# Patient Record
Sex: Female | Born: 1935
Health system: Southern US, Community
[De-identification: ages and names within clinical notes are randomized; demographics above are authoritative.]

## PROBLEM LIST (undated history)

## (undated) DIAGNOSIS — E785 Hyperlipidemia, unspecified: Secondary | ICD-10-CM

## (undated) DIAGNOSIS — D509 Iron deficiency anemia, unspecified: Secondary | ICD-10-CM

## (undated) DIAGNOSIS — K219 Gastro-esophageal reflux disease without esophagitis: Secondary | ICD-10-CM

## (undated) DIAGNOSIS — T4145XA Adverse effect of unspecified anesthetic, initial encounter: Secondary | ICD-10-CM

## (undated) DIAGNOSIS — H3530 Unspecified macular degeneration: Secondary | ICD-10-CM

## (undated) DIAGNOSIS — K449 Diaphragmatic hernia without obstruction or gangrene: Secondary | ICD-10-CM

## (undated) DIAGNOSIS — J381 Polyp of vocal cord and larynx: Secondary | ICD-10-CM

## (undated) DIAGNOSIS — M1990 Unspecified osteoarthritis, unspecified site: Secondary | ICD-10-CM

## (undated) DIAGNOSIS — C541 Malignant neoplasm of endometrium: Secondary | ICD-10-CM

## (undated) DIAGNOSIS — J449 Chronic obstructive pulmonary disease, unspecified: Secondary | ICD-10-CM

## (undated) DIAGNOSIS — Z5189 Encounter for other specified aftercare: Secondary | ICD-10-CM

## (undated) DIAGNOSIS — Z9889 Other specified postprocedural states: Secondary | ICD-10-CM

## (undated) DIAGNOSIS — I639 Cerebral infarction, unspecified: Secondary | ICD-10-CM

## (undated) DIAGNOSIS — IMO0002 Reserved for concepts with insufficient information to code with codable children: Secondary | ICD-10-CM

## (undated) DIAGNOSIS — T8859XA Other complications of anesthesia, initial encounter: Secondary | ICD-10-CM

## (undated) DIAGNOSIS — Z853 Personal history of malignant neoplasm of breast: Secondary | ICD-10-CM

## (undated) DIAGNOSIS — F419 Anxiety disorder, unspecified: Secondary | ICD-10-CM

## (undated) DIAGNOSIS — H269 Unspecified cataract: Secondary | ICD-10-CM

## (undated) DIAGNOSIS — F32A Depression, unspecified: Secondary | ICD-10-CM

## (undated) DIAGNOSIS — T7840XA Allergy, unspecified, initial encounter: Secondary | ICD-10-CM

## (undated) DIAGNOSIS — D689 Coagulation defect, unspecified: Secondary | ICD-10-CM

## (undated) DIAGNOSIS — C4492 Squamous cell carcinoma of skin, unspecified: Secondary | ICD-10-CM

## (undated) DIAGNOSIS — K635 Polyp of colon: Secondary | ICD-10-CM

## (undated) DIAGNOSIS — R001 Bradycardia, unspecified: Secondary | ICD-10-CM

## (undated) DIAGNOSIS — C4491 Basal cell carcinoma of skin, unspecified: Secondary | ICD-10-CM

## (undated) DIAGNOSIS — G510 Bell's palsy: Secondary | ICD-10-CM

## (undated) DIAGNOSIS — C50919 Malignant neoplasm of unspecified site of unspecified female breast: Secondary | ICD-10-CM

## (undated) DIAGNOSIS — M779 Enthesopathy, unspecified: Secondary | ICD-10-CM

## (undated) DIAGNOSIS — I10 Essential (primary) hypertension: Secondary | ICD-10-CM

## (undated) DIAGNOSIS — Z8673 Personal history of transient ischemic attack (TIA), and cerebral infarction without residual deficits: Secondary | ICD-10-CM

## (undated) DIAGNOSIS — K819 Cholecystitis, unspecified: Secondary | ICD-10-CM

## (undated) DIAGNOSIS — B029 Zoster without complications: Secondary | ICD-10-CM

## (undated) DIAGNOSIS — I714 Abdominal aortic aneurysm, without rupture: Secondary | ICD-10-CM

## (undated) DIAGNOSIS — R112 Nausea with vomiting, unspecified: Secondary | ICD-10-CM

## (undated) HISTORY — DX: Unspecified macular degeneration: H35.30

## (undated) HISTORY — PX: TONSILLECTOMY: SUR1361

## (undated) HISTORY — DX: Iron deficiency anemia, unspecified: D50.9

## (undated) HISTORY — DX: Squamous cell carcinoma of skin, unspecified: C44.92

## (undated) HISTORY — DX: Essential (primary) hypertension: I10

## (undated) HISTORY — DX: Chronic obstructive pulmonary disease, unspecified: J44.9

## (undated) HISTORY — DX: Allergy, unspecified, initial encounter: T78.40XA

## (undated) HISTORY — DX: Diaphragmatic hernia without obstruction or gangrene: K44.9

## (undated) HISTORY — PX: SQUAMOUS CELL CARCINOMA EXCISION: SHX2433

## (undated) HISTORY — PX: APPENDECTOMY: SHX54

## (undated) HISTORY — DX: Unspecified cataract: H26.9

## (undated) HISTORY — PX: COLONOSCOPY W/ POLYPECTOMY: SHX1380

## (undated) HISTORY — DX: Personal history of transient ischemic attack (TIA), and cerebral infarction without residual deficits: Z86.73

## (undated) HISTORY — DX: Coagulation defect, unspecified: D68.9

## (undated) HISTORY — DX: Polyp of vocal cord and larynx: J38.1

## (undated) HISTORY — DX: Cerebral infarction, unspecified: I63.9

## (undated) HISTORY — DX: Bradycardia, unspecified: R00.1

## (undated) HISTORY — PX: ABDOMINAL HYSTERECTOMY: SHX81

## (undated) HISTORY — DX: Zoster without complications: B02.9

## (undated) HISTORY — PX: BREAST SURGERY: SHX581

## (undated) HISTORY — PX: BASAL CELL CARCINOMA EXCISION: SHX1214

## (undated) HISTORY — PX: COLONOSCOPY: SHX174

## (undated) HISTORY — PX: TEAR DUCT PROBING: SHX793

## (undated) HISTORY — DX: Bell's palsy: G51.0

## (undated) HISTORY — DX: Gastro-esophageal reflux disease without esophagitis: K21.9

## (undated) HISTORY — DX: Unspecified osteoarthritis, unspecified site: M19.90

## (undated) HISTORY — DX: Hyperlipidemia, unspecified: E78.5

## (undated) HISTORY — DX: Polyp of colon: K63.5

## (undated) HISTORY — DX: Encounter for other specified aftercare: Z51.89

## (undated) HISTORY — DX: Depression, unspecified: F32.A

## (undated) SURGERY — Surgical Case
Anesthesia: *Unknown

---

## 1967-02-01 HISTORY — PX: OTHER SURGICAL HISTORY: SHX169

## 1982-01-31 DIAGNOSIS — G510 Bell's palsy: Secondary | ICD-10-CM

## 1982-01-31 HISTORY — DX: Bell's palsy: G51.0

## 1998-08-15 ENCOUNTER — Other Ambulatory Visit: Admission: RE | Admit: 1998-08-15 | Discharge: 1998-08-15 | Payer: Self-pay | Admitting: Obstetrics and Gynecology

## 1999-05-25 ENCOUNTER — Other Ambulatory Visit: Admission: RE | Admit: 1999-05-25 | Discharge: 1999-05-25 | Payer: Self-pay | Admitting: Obstetrics and Gynecology

## 2000-09-15 ENCOUNTER — Other Ambulatory Visit: Admission: RE | Admit: 2000-09-15 | Discharge: 2000-09-15 | Payer: Self-pay | Admitting: Obstetrics and Gynecology

## 2000-10-03 ENCOUNTER — Encounter: Payer: Self-pay | Admitting: Gastroenterology

## 2000-10-03 ENCOUNTER — Encounter: Admission: RE | Admit: 2000-10-03 | Discharge: 2000-10-03 | Payer: Self-pay | Admitting: Gastroenterology

## 2000-11-22 ENCOUNTER — Encounter: Payer: Self-pay | Admitting: Surgery

## 2000-11-22 ENCOUNTER — Observation Stay (HOSPITAL_COMMUNITY): Admission: RE | Admit: 2000-11-22 | Discharge: 2000-11-23 | Payer: Self-pay | Admitting: Surgery

## 2002-01-31 DIAGNOSIS — K819 Cholecystitis, unspecified: Secondary | ICD-10-CM

## 2002-01-31 HISTORY — DX: Cholecystitis, unspecified: K81.9

## 2002-01-31 HISTORY — PX: CHOLECYSTECTOMY: SHX55

## 2004-02-19 ENCOUNTER — Ambulatory Visit: Payer: Self-pay | Admitting: Internal Medicine

## 2004-02-19 ENCOUNTER — Encounter: Admission: RE | Admit: 2004-02-19 | Discharge: 2004-02-19 | Payer: Self-pay | Admitting: Internal Medicine

## 2004-02-26 ENCOUNTER — Ambulatory Visit: Payer: Self-pay | Admitting: Internal Medicine

## 2004-02-27 ENCOUNTER — Encounter: Admission: RE | Admit: 2004-02-27 | Discharge: 2004-02-27 | Payer: Self-pay | Admitting: Internal Medicine

## 2004-03-12 ENCOUNTER — Ambulatory Visit: Payer: Self-pay | Admitting: Internal Medicine

## 2004-03-17 ENCOUNTER — Encounter: Admission: RE | Admit: 2004-03-17 | Discharge: 2004-04-14 | Payer: Self-pay | Admitting: Internal Medicine

## 2004-09-22 ENCOUNTER — Encounter: Payer: Self-pay | Admitting: Internal Medicine

## 2004-09-30 ENCOUNTER — Ambulatory Visit: Payer: Self-pay | Admitting: Internal Medicine

## 2004-10-05 ENCOUNTER — Ambulatory Visit: Payer: Self-pay

## 2004-10-05 ENCOUNTER — Encounter: Payer: Self-pay | Admitting: Internal Medicine

## 2004-10-05 ENCOUNTER — Ambulatory Visit: Payer: Self-pay | Admitting: Cardiology

## 2005-01-11 ENCOUNTER — Ambulatory Visit: Payer: Self-pay | Admitting: Internal Medicine

## 2005-01-31 LAB — HM MAMMOGRAPHY

## 2005-01-31 LAB — CONVERTED CEMR LAB: Pap Smear: NORMAL

## 2005-03-07 ENCOUNTER — Ambulatory Visit: Payer: Self-pay | Admitting: Internal Medicine

## 2005-03-14 ENCOUNTER — Ambulatory Visit: Payer: Self-pay | Admitting: Internal Medicine

## 2005-09-20 ENCOUNTER — Ambulatory Visit: Payer: Self-pay | Admitting: Internal Medicine

## 2005-09-28 ENCOUNTER — Ambulatory Visit: Payer: Self-pay | Admitting: Internal Medicine

## 2005-10-06 ENCOUNTER — Ambulatory Visit: Payer: Self-pay | Admitting: Internal Medicine

## 2005-11-03 ENCOUNTER — Ambulatory Visit: Payer: Self-pay | Admitting: Internal Medicine

## 2005-11-03 DIAGNOSIS — E785 Hyperlipidemia, unspecified: Secondary | ICD-10-CM

## 2005-11-03 DIAGNOSIS — K219 Gastro-esophageal reflux disease without esophagitis: Secondary | ICD-10-CM

## 2005-11-03 HISTORY — DX: Gastro-esophageal reflux disease without esophagitis: K21.9

## 2005-11-03 HISTORY — DX: Hyperlipidemia, unspecified: E78.5

## 2005-11-10 ENCOUNTER — Encounter: Admission: RE | Admit: 2005-11-10 | Discharge: 2005-11-10 | Payer: Self-pay | Admitting: Internal Medicine

## 2005-11-21 ENCOUNTER — Ambulatory Visit: Payer: Self-pay | Admitting: Internal Medicine

## 2006-01-31 DIAGNOSIS — B029 Zoster without complications: Secondary | ICD-10-CM

## 2006-01-31 DIAGNOSIS — K449 Diaphragmatic hernia without obstruction or gangrene: Secondary | ICD-10-CM

## 2006-01-31 HISTORY — DX: Diaphragmatic hernia without obstruction or gangrene: K44.9

## 2006-01-31 HISTORY — DX: Zoster without complications: B02.9

## 2006-03-08 ENCOUNTER — Ambulatory Visit: Payer: Self-pay | Admitting: Internal Medicine

## 2006-03-08 LAB — CONVERTED CEMR LAB
ALT: 19 units/L (ref 0–40)
AST: 25 units/L (ref 0–37)
Albumin: 3.8 g/dL (ref 3.5–5.2)
Alkaline Phosphatase: 92 units/L (ref 39–117)
BUN: 6 mg/dL (ref 6–23)
Basophils Absolute: 0 10*3/uL (ref 0.0–0.1)
Basophils Relative: 0.6 % (ref 0.0–1.0)
Bilirubin, Direct: 0.1 mg/dL (ref 0.0–0.3)
CO2: 32 meq/L (ref 19–32)
Calcium: 9.9 mg/dL (ref 8.4–10.5)
Chloride: 105 meq/L (ref 96–112)
Cholesterol: 209 mg/dL (ref 0–200)
Creatinine, Ser: 1.2 mg/dL (ref 0.4–1.2)
Direct LDL: 134.8 mg/dL
Eosinophils Absolute: 0.1 10*3/uL (ref 0.0–0.6)
Eosinophils Relative: 1 % (ref 0.0–5.0)
Folate: 10.3 ng/mL
GFR calc Af Amer: 57 mL/min
GFR calc non Af Amer: 47 mL/min
Glucose, Bld: 95 mg/dL (ref 70–99)
HCT: 41.6 % (ref 36.0–46.0)
HDL: 51.1 mg/dL (ref >39.0)
Hemoglobin: 14.9 g/dL (ref 12.0–15.0)
Lymphocytes Relative: 22.5 % (ref 12.0–46.0)
MCHC: 35.7 g/dL (ref 30.0–36.0)
MCV: 96.8 fL (ref 78.0–100.0)
Monocytes Absolute: 0.6 10*3/uL (ref 0.2–0.7)
Monocytes Relative: 8.6 % (ref 3.0–11.0)
Neutro Abs: 4.7 10*3/uL (ref 1.4–7.7)
Neutrophils Relative %: 67.3 % (ref 43.0–77.0)
Platelets: 250 10*3/uL (ref 150–400)
Potassium: 4.6 meq/L (ref 3.5–5.1)
RBC: 4.3 M/uL (ref 3.87–5.11)
RDW: 11.1 % — ABNORMAL LOW (ref 11.5–14.6)
Sodium: 143 meq/L (ref 135–145)
TSH: 2.51 microintl units/mL (ref 0.35–5.50)
Total Bilirubin: 0.6 mg/dL (ref 0.3–1.2)
Total CHOL/HDL Ratio: 4.1
Total Protein: 7.2 g/dL (ref 6.0–8.3)
Triglycerides: 115 mg/dL (ref 0–149)
VLDL: 23 mg/dL (ref 0–40)
Vitamin B-12: 264 pg/mL (ref 211–911)
WBC: 7 10*3/uL (ref 4.5–10.5)

## 2006-04-10 ENCOUNTER — Encounter: Admission: RE | Admit: 2006-04-10 | Discharge: 2006-04-10 | Payer: Self-pay | Admitting: Gastroenterology

## 2006-05-16 ENCOUNTER — Ambulatory Visit: Payer: Self-pay | Admitting: Internal Medicine

## 2006-06-06 ENCOUNTER — Encounter: Payer: Self-pay | Admitting: Internal Medicine

## 2006-12-07 ENCOUNTER — Ambulatory Visit: Payer: Self-pay | Admitting: Internal Medicine

## 2006-12-07 LAB — CONVERTED CEMR LAB
Cholesterol: 253 mg/dL (ref 0–200)
Direct LDL: 188 mg/dL
HDL: 49.2 mg/dL (ref >39.0)
Total CHOL/HDL Ratio: 5.1
Triglycerides: 91 mg/dL (ref 0–149)
VLDL: 18 mg/dL (ref 0–40)

## 2006-12-13 ENCOUNTER — Ambulatory Visit: Payer: Self-pay | Admitting: Internal Medicine

## 2007-02-01 DIAGNOSIS — Z8673 Personal history of transient ischemic attack (TIA), and cerebral infarction without residual deficits: Secondary | ICD-10-CM

## 2007-02-01 HISTORY — DX: Personal history of transient ischemic attack (TIA), and cerebral infarction without residual deficits: Z86.73

## 2007-02-06 ENCOUNTER — Telehealth: Payer: Self-pay | Admitting: Internal Medicine

## 2007-03-09 ENCOUNTER — Telehealth: Payer: Self-pay | Admitting: Internal Medicine

## 2007-03-09 ENCOUNTER — Ambulatory Visit: Payer: Self-pay | Admitting: Internal Medicine

## 2007-03-29 ENCOUNTER — Ambulatory Visit: Payer: Self-pay | Admitting: Internal Medicine

## 2007-03-29 LAB — CONVERTED CEMR LAB
ALT: 13 units/L (ref 0–35)
AST: 20 units/L (ref 0–37)
Bilirubin, Direct: 0.2 mg/dL (ref 0.0–0.3)
CO2: 31 meq/L (ref 19–32)
Calcium: 9.5 mg/dL (ref 8.4–10.5)
Chloride: 108 meq/L (ref 96–112)
Creatinine, Ser: 1 mg/dL (ref 0.4–1.2)
GFR calc non Af Amer: 58 mL/min
Glucose, Bld: 96 mg/dL (ref 70–99)
Total Bilirubin: 0.8 mg/dL (ref 0.3–1.2)
Total CHOL/HDL Ratio: 5.1

## 2007-04-05 ENCOUNTER — Ambulatory Visit: Payer: Self-pay | Admitting: Internal Medicine

## 2007-06-14 ENCOUNTER — Encounter: Payer: Self-pay | Admitting: Internal Medicine

## 2007-09-01 DIAGNOSIS — I639 Cerebral infarction, unspecified: Secondary | ICD-10-CM

## 2007-09-01 HISTORY — DX: Cerebral infarction, unspecified: I63.9

## 2007-09-11 ENCOUNTER — Telehealth: Payer: Self-pay | Admitting: Internal Medicine

## 2007-09-11 ENCOUNTER — Ambulatory Visit: Payer: Self-pay | Admitting: Internal Medicine

## 2007-09-11 ENCOUNTER — Inpatient Hospital Stay (HOSPITAL_COMMUNITY): Admission: EM | Admit: 2007-09-11 | Discharge: 2007-09-13 | Payer: Self-pay | Admitting: Emergency Medicine

## 2007-09-11 ENCOUNTER — Ambulatory Visit: Payer: Self-pay | Admitting: Cardiovascular Disease

## 2007-09-12 ENCOUNTER — Encounter: Payer: Self-pay | Admitting: Internal Medicine

## 2007-09-12 ENCOUNTER — Ambulatory Visit: Payer: Self-pay | Admitting: Vascular Surgery

## 2007-09-20 ENCOUNTER — Encounter: Payer: Self-pay | Admitting: Internal Medicine

## 2007-09-20 ENCOUNTER — Encounter: Admission: RE | Admit: 2007-09-20 | Discharge: 2007-10-24 | Payer: Self-pay | Admitting: Internal Medicine

## 2007-09-24 ENCOUNTER — Ambulatory Visit: Payer: Self-pay | Admitting: Internal Medicine

## 2007-09-24 DIAGNOSIS — Z8673 Personal history of transient ischemic attack (TIA), and cerebral infarction without residual deficits: Secondary | ICD-10-CM

## 2007-09-24 DIAGNOSIS — E669 Obesity, unspecified: Secondary | ICD-10-CM | POA: Insufficient documentation

## 2007-09-24 HISTORY — DX: Personal history of transient ischemic attack (TIA), and cerebral infarction without residual deficits: Z86.73

## 2007-09-26 DIAGNOSIS — I10 Essential (primary) hypertension: Secondary | ICD-10-CM

## 2007-09-26 HISTORY — DX: Essential (primary) hypertension: I10

## 2007-09-27 ENCOUNTER — Telehealth: Payer: Self-pay | Admitting: Internal Medicine

## 2007-12-17 ENCOUNTER — Ambulatory Visit: Payer: Self-pay | Admitting: Internal Medicine

## 2007-12-17 LAB — CONVERTED CEMR LAB
Alkaline Phosphatase: 81 units/L (ref 39–117)
Bilirubin, Direct: 0.1 mg/dL (ref 0.0–0.3)
GFR calc Af Amer: 63 mL/min
GFR calc non Af Amer: 52 mL/min
Glucose, Bld: 103 mg/dL — ABNORMAL HIGH (ref 70–99)
HDL: 42.7 mg/dL (ref >39.0)
Potassium: 5.1 meq/L (ref 3.5–5.1)
Sodium: 145 meq/L (ref 135–145)
Total Protein: 6.9 g/dL (ref 6.0–8.3)

## 2008-01-07 ENCOUNTER — Ambulatory Visit: Payer: Self-pay | Admitting: Internal Medicine

## 2008-01-17 ENCOUNTER — Encounter: Admission: RE | Admit: 2008-01-17 | Discharge: 2008-01-29 | Payer: Self-pay | Admitting: Internal Medicine

## 2008-03-18 ENCOUNTER — Telehealth: Payer: Self-pay | Admitting: Internal Medicine

## 2008-04-01 ENCOUNTER — Ambulatory Visit: Payer: Self-pay | Admitting: Internal Medicine

## 2008-04-01 LAB — CONVERTED CEMR LAB
Calcium: 9.6 mg/dL (ref 8.4–10.5)
Creatinine, Ser: 0.9 mg/dL (ref 0.4–1.2)
GFR calc non Af Amer: 65 mL/min
HDL: 46.1 mg/dL (ref >39.0)
LDL Cholesterol: 98 mg/dL (ref 0–99)
Total Bilirubin: 0.8 mg/dL (ref 0.3–1.2)
Total CHOL/HDL Ratio: 3.6
Triglycerides: 113 mg/dL (ref 0–149)

## 2008-04-08 ENCOUNTER — Ambulatory Visit: Payer: Self-pay | Admitting: Internal Medicine

## 2008-10-21 ENCOUNTER — Ambulatory Visit: Payer: Self-pay | Admitting: Internal Medicine

## 2008-10-24 LAB — CONVERTED CEMR LAB
Albumin: 3.6 g/dL (ref 3.5–5.2)
Alkaline Phosphatase: 81 units/L (ref 39–117)
Basophils Absolute: 0 10*3/uL (ref 0.0–0.1)
Cholesterol: 145 mg/dL (ref 0–200)
Hemoglobin: 13.6 g/dL (ref 12.0–15.0)
LDL Cholesterol: 83 mg/dL (ref 0–99)
Lymphocytes Relative: 29.6 % (ref 12.0–46.0)
Monocytes Relative: 10.2 % (ref 3.0–12.0)
Neutro Abs: 4 10*3/uL (ref 1.4–7.7)
Neutrophils Relative %: 58.5 % (ref 43.0–77.0)
RBC: 4.03 M/uL (ref 3.87–5.11)
RDW: 11.7 % (ref 11.5–14.6)
Total Protein: 7.1 g/dL (ref 6.0–8.3)
Triglycerides: 76 mg/dL (ref 0.0–149.0)
VLDL: 15.2 mg/dL (ref 0.0–40.0)

## 2008-10-28 ENCOUNTER — Ambulatory Visit: Payer: Self-pay | Admitting: Internal Medicine

## 2008-11-18 ENCOUNTER — Telehealth: Payer: Self-pay | Admitting: Internal Medicine

## 2008-11-19 ENCOUNTER — Ambulatory Visit: Payer: Self-pay | Admitting: Internal Medicine

## 2008-11-25 ENCOUNTER — Encounter (INDEPENDENT_AMBULATORY_CARE_PROVIDER_SITE_OTHER): Payer: Self-pay | Admitting: *Deleted

## 2008-11-25 LAB — HM COLONOSCOPY

## 2008-12-04 ENCOUNTER — Encounter: Payer: Self-pay | Admitting: Internal Medicine

## 2009-01-31 DIAGNOSIS — C4491 Basal cell carcinoma of skin, unspecified: Secondary | ICD-10-CM

## 2009-01-31 HISTORY — DX: Basal cell carcinoma of skin, unspecified: C44.91

## 2009-02-25 ENCOUNTER — Ambulatory Visit: Payer: Self-pay | Admitting: Internal Medicine

## 2009-06-18 ENCOUNTER — Ambulatory Visit: Payer: Self-pay | Admitting: Internal Medicine

## 2009-06-18 LAB — CONVERTED CEMR LAB
BUN: 10 mg/dL (ref 6–23)
Basophils Absolute: 0.1 10*3/uL (ref 0.0–0.1)
Calcium: 9.5 mg/dL (ref 8.4–10.5)
Creatinine, Ser: 0.9 mg/dL (ref 0.4–1.2)
Eosinophils Absolute: 0 10*3/uL (ref 0.0–0.7)
GFR calc non Af Amer: 64.14 mL/min (ref >60)
Glucose, Bld: 85 mg/dL (ref 70–99)
HDL: 57.8 mg/dL (ref >39.00)
Hemoglobin: 13.8 g/dL (ref 12.0–15.0)
Lymphocytes Relative: 22.8 % (ref 12.0–46.0)
Monocytes Relative: 8.1 % (ref 3.0–12.0)
Neutrophils Relative %: 68.3 % (ref 43.0–77.0)
Platelets: 238 10*3/uL (ref 150.0–400.0)
RDW: 12.6 % (ref 11.5–14.6)
Sodium: 143 meq/L (ref 135–145)
TSH: 3.77 microintl units/mL (ref 0.35–5.50)
Triglycerides: 91 mg/dL (ref 0.0–149.0)
VLDL: 18.2 mg/dL (ref 0.0–40.0)

## 2009-06-25 ENCOUNTER — Ambulatory Visit: Payer: Self-pay | Admitting: Internal Medicine

## 2009-06-25 LAB — CONVERTED CEMR LAB
ALT: 16 units/L (ref 0–35)
AST: 22 units/L (ref 0–37)
Basophils Relative: 0.7 % (ref 0.0–3.0)
Bilirubin, Direct: 0.1 mg/dL (ref 0.0–0.3)
Chloride: 106 meq/L (ref 96–112)
Eosinophils Absolute: 0.1 10*3/uL (ref 0.0–0.7)
Eosinophils Relative: 1.1 % (ref 0.0–5.0)
GFR calc non Af Amer: 53.78 mL/min (ref >60)
HCT: 38.3 % (ref 36.0–46.0)
Lymphs Abs: 2.1 10*3/uL (ref 0.7–4.0)
MCHC: 34.4 g/dL (ref 30.0–36.0)
MCV: 98.9 fL (ref 78.0–100.0)
Monocytes Absolute: 0.5 10*3/uL (ref 0.1–1.0)
Neutrophils Relative %: 66.1 % (ref 43.0–77.0)
Platelets: 229 10*3/uL (ref 150.0–400.0)
Potassium: 4.3 meq/L (ref 3.5–5.1)
RBC: 3.88 M/uL (ref 3.87–5.11)
Sed Rate: 24 mm/hr — ABNORMAL HIGH (ref 0–22)
Sodium: 142 meq/L (ref 135–145)
Total Bilirubin: 0.4 mg/dL (ref 0.3–1.2)

## 2009-07-14 ENCOUNTER — Encounter: Admission: RE | Admit: 2009-07-14 | Discharge: 2009-08-13 | Payer: Self-pay | Admitting: Internal Medicine

## 2009-07-16 ENCOUNTER — Encounter: Payer: Self-pay | Admitting: Internal Medicine

## 2009-07-20 ENCOUNTER — Telehealth: Payer: Self-pay | Admitting: Internal Medicine

## 2009-08-05 ENCOUNTER — Ambulatory Visit: Payer: Self-pay | Admitting: Internal Medicine

## 2009-08-06 ENCOUNTER — Telehealth: Payer: Self-pay | Admitting: Internal Medicine

## 2009-08-11 ENCOUNTER — Encounter: Payer: Self-pay | Admitting: Internal Medicine

## 2009-08-13 ENCOUNTER — Encounter: Payer: Self-pay | Admitting: Internal Medicine

## 2009-10-16 ENCOUNTER — Emergency Department (HOSPITAL_COMMUNITY): Admission: EM | Admit: 2009-10-16 | Discharge: 2009-10-16 | Payer: Self-pay | Admitting: Emergency Medicine

## 2009-10-26 ENCOUNTER — Encounter: Payer: Self-pay | Admitting: Internal Medicine

## 2009-10-28 HISTORY — PX: OTHER SURGICAL HISTORY: SHX169

## 2009-11-11 ENCOUNTER — Ambulatory Visit: Payer: Self-pay | Admitting: Internal Medicine

## 2009-12-17 ENCOUNTER — Telehealth: Payer: Self-pay | Admitting: Internal Medicine

## 2010-01-31 DIAGNOSIS — I10 Essential (primary) hypertension: Secondary | ICD-10-CM

## 2010-01-31 HISTORY — DX: Essential (primary) hypertension: I10

## 2010-03-04 NOTE — Progress Notes (Signed)
Summary: lab results.  Phone Note Call from Patient   Caller: Patient Call For: Birdie Sons MD Summary of Call: Pt is calling for lab results and a copy. [phone] Initial call taken by: Lynann Beaver CMA,  July 20, 2009 11:29 AM  Follow-up for Phone Call        At front desk for pick up.Patient  husband notified of this and results. Follow-up by: Gladis Riffle, RN,  July 20, 2009 3:45 PM

## 2010-03-04 NOTE — Letter (Signed)
Summary: Office Visit/[facility]  Office Visit/[facility]   Imported By: Maryln Gottron 11/05/2009 11:06:45  _____________________________________________________________________  External Attachment:    Type:   Image     Comment:   External Document

## 2010-03-04 NOTE — Miscellaneous (Signed)
Summary: Initial Summary for PT Services/[facility]  Initial Summary for PT Services/[facility]   Imported By: Maryln Gottron 07/17/2009 10:55:16  _____________________________________________________________________  External Attachment:    Type:   Image     Comment:   External Document

## 2010-03-04 NOTE — Assessment & Plan Note (Signed)
Summary: 4 MO ROV/MM   Vital Signs:  Patient profile:   77 year old female Weight:      202 pounds BMI:     32.23 Temp:     98.1 degrees F Pulse rate:   64 / minute Resp:     12 per minute BP sitting:   128 / 76  (left arm)  Vitals Entered By: Gladis Riffle, RN (February 25, 2009 11:22 AM)  Nutrition Counseling: Patient's BMI is greater than 25 and therefore counseled on weight management options.   History of Present Illness:  Follow-Up Visit      This is a 77 year old woman who presents for Follow-up visit.  The patient denies chest pain and palpitations.  Since the last visit the patient notes no new problems or concerns.  The patient reports taking meds as prescribed.  When questioned about possible medication side effects, the patient notes none.    All other systems reviewed and were negative   Preventive Screening-Counseling & Management  Alcohol-Tobacco     Smoking Status: quit     Year Quit: 1998  Current Problems (verified): 1)  Preventive Health Care  (ICD-V70.0) 2)  Obesity  (ICD-278.00) 3)  Hypertension  (ICD-401.9) 4)  Cva  (ICD-434.91) 5)  Hyperlipidemia  (ICD-272.4) 6)  Gerd  (ICD-530.81)  Current Medications (verified): 1)  Prevacid 30 Mg  Cpdr (Lansoprazole) .... Take 1 Capsule By Mouth Once A Day 2)  Plavix 75 Mg Tabs (Clopidogrel Bisulfate) .... Take One Tab Once Daily 3)  Aspirin Ec 325 Mg Tbec (Aspirin) .... Every Bedtime 4)  Fish Oil  Oil (Fish Oil) .Marland Kitchen.. 1000 Mg Three Times A Day 5)  Crestor 10 Mg Tabs (Rosuvastatin Calcium) .Marland Kitchen.. 1 Tablet By Mouth Every Other Day 6)  Vitamin C 1000 Mg Tabs (Ascorbic Acid) .... Two  Daily 7)  Fluticasone Propionate 50 Mcg/act  Susp (Fluticasone Propionate) .... 2 Sprays Each Nostril Once Daily  Allergies: 1)  ! Benadryl-D Allergy/sinus Child 2)  ! Doxycycline 3)  ! Simvastatin  Comments:  Nurse/Medical Assistant: 4 month rov, fasting-- wants Rx crestor for #30  The patient's medications and allergies were  reviewed with the patient and were updated in the Medication and Allergy Lists. Gladis Riffle, RN (February 25, 2009 11:23 AM)  Past History:  Past Medical History: Last updated: 09/24/2007 GERD Hyperlipidemia shingles Hypertension Cerebrovascular accident, hx of  Past Surgical History: Last updated: 11/21/2005 Cholecystectomy  Family History: Last updated: Jan 06, 2007 father deceased--heart problems 39 yo mother 35 yo-living  Social History: Last updated: 10/28/2008 Married Former Smoker--quit 1998 Regular exercise-no  Risk Factors: Exercise: no (06-Jan-2007)  Risk Factors: Smoking Status: quit (02/25/2009)  Physical Exam  General:  Well-developed,well-nourished,in no acute distress; alert,appropriate and cooperative throughout examination Head:  normocephalic and atraumatic.   Eyes:  pupils equal and pupils round.   Ears:  R ear normal and L ear normal.   Nose:  no external deformity and no external erythema.   Neck:  No deformities, masses, or tenderness noted. Lungs:  normal respiratory effort and no intercostal retractions.   Heart:  normal rate and regular rhythm.   Abdomen:  Bowel sounds positive,abdomen soft and non-tender without masses, organomegaly or hernias noted. Msk:  normal ROM and no joint swelling.   Neurologic:  cranial nerves II-XII intact and gait normal.   Skin:  turgor normal and color normal.   Psych:  memory intact for recent and remote and good eye contact.     Impression &  Recommendations:  Problem # 1:  OBESITY (ICD-278.00) this is clearly her main problem initial goal should be to lose 50 pounds daily exercise and low calorie diet  Problem # 2:  HYPERTENSION (ICD-401.9) BP is adequate and on no meds BP today: 128/76 Prior BP: 128/80 (10/28/2008)  Labs Reviewed: K+: 4.5 (04/01/2008) Creat: : 0.9 (04/01/2008)   Chol: 145 (10/21/2008)   HDL: 46.50 (10/21/2008)   LDL: 83 (10/21/2008)   TG: 76.0 (10/21/2008)  Problem # 3:   HYPERLIPIDEMIA (ICD-272.4)  continue current medications  Her updated medication list for this problem includes:    Crestor 10 Mg Tabs (Rosuvastatin calcium) .Marland Kitchen... Take 1 tablet by mouth once a day or as directed  Labs Reviewed: SGOT: 20 (10/21/2008)   SGPT: 15 (10/21/2008)   HDL:46.50 (10/21/2008), 46.1 (04/01/2008)  LDL:83 (10/21/2008), 98 (04/01/2008)  Chol:145 (10/21/2008), 167 (04/01/2008)  Trig:76.0 (10/21/2008), 113 (04/01/2008)  Orders: Prescription Created Electronically [phone])  Problem # 4:  CVA (ICD-434.91) no reccurrence Her updated medication list for this problem includes:    Plavix 75 Mg Tabs (Clopidogrel bisulfate) .Marland Kitchen... Take one tab once daily    Aspirin Ec 325 Mg Tbec (Aspirin) ..... Every bedtime  Complete Medication List: 1)  Prevacid 30 Mg Cpdr (Lansoprazole) .... Take 1 capsule by mouth once a day 2)  Plavix 75 Mg Tabs (Clopidogrel bisulfate) .... Take one tab once daily 3)  Aspirin Ec 325 Mg Tbec (Aspirin) .... Every bedtime 4)  Fish Oil Oil (Fish oil) .Marland Kitchen.. 1000 mg three times a day 5)  Crestor 10 Mg Tabs (Rosuvastatin calcium) .... Take 1 tablet by mouth once a day or as directed 6)  Vitamin C 1000 Mg Tabs (Ascorbic acid) .... Two  daily 7)  Fluticasone Propionate 50 Mcg/act Susp (Fluticasone propionate) .... 2 sprays each nostril once daily  Patient Instructions: 1)  Please schedule a follow-up appointment in 4 months. 2)  lipids 272.4 3)  liver 995.2  4)  bmet--995.2 5)  cbc diff--995.2 Prescriptions: CRESTOR 10 MG TABS (ROSUVASTATIN CALCIUM) Take 1 tablet by mouth once a day or as directed  #30 x 6   Entered and Authorized by:   Birdie Sons MD   Signed by:   Birdie Sons MD on 02/25/2009   Method used:   [facility] to        [facility] [phone]* (retail)       [address]       [place], [place]  [address]       Ph: [phone] or [phone]       Fax: [phone]   RxID:   [id number]

## 2010-03-04 NOTE — Assessment & Plan Note (Signed)
Summary: breast redness/pain/dm   Vital Signs:  Patient profile:   77 year old female Weight:      206 pounds Temp:     98.7 degrees F oral Pulse rate:   60 / minute Pulse rhythm:   regular Resp:     12 per minute BP sitting:   148 / 80  (left arm) Cuff size:   regular  Vitals Entered By: Gladis Riffle, RN (August 05, 2009 11:30 AM) CC: c/o left breast redness and soreness since 08/03/09 Is Patient Diabetic? No   CC:  c/o left breast redness and soreness since 08/03/09.  History of Present Illness: 3 day history of left breast soreness and localized erythema tender to touch last mammogram---unknown no fever or chills no sweats No trauma no fhx of breast CA All other systems reviewed and were negative   Preventive Screening-Counseling & Management  Alcohol-Tobacco     Smoking Status: quit     Year Quit: 1998  Current Medications (verified): 1)  Prevacid 30 Mg  Cpdr (Lansoprazole) .... Take 1 Capsule By Mouth Once A Day 2)  Plavix 75 Mg Tabs (Clopidogrel Bisulfate) .... Take One Tab Once Daily 3)  Aspirin Ec 325 Mg Tbec (Aspirin) .... Every Bedtime 4)  Fish Oil  Oil (Fish Oil) .Marland Kitchen.. 1000 Mg Three Times A Day 5)  Crestor 10 Mg Tabs (Rosuvastatin Calcium) .... Take 1 Tablet By Mouth Once A Day or As Directed 6)  Vitamin C 1000 Mg Tabs (Ascorbic Acid) .... Two  Daily 7)  Fluticasone Propionate 50 Mcg/act  Susp (Fluticasone Propionate) .... 2 Sprays Each Nostril Once Daily  Allergies: 1)  ! Benadryl-D Allergy/sinus Child 2)  ! Doxycycline 3)  ! Simvastatin  Past History:  Past Medical History: Last updated: 09/24/2007 GERD Hyperlipidemia shingles Hypertension Cerebrovascular accident, hx of  Past Surgical History: Last updated: 11/21/2005 Cholecystectomy  Family History: Last updated: 2007-01-03 father deceased--heart problems 47 yo mother 22 yo-living  Social History: Last updated: 10/28/2008 Married Former Smoker--quit 1998 Regular exercise-no  Risk  Factors: Exercise: no (01/03/07)  Risk Factors: Smoking Status: quit (08/05/2009)  Physical Exam  General:  alert and well-developed.   Head:  normocephalic and atraumatic.   Eyes:  pupils equal and pupils round.   Ears:  R ear normal and L ear normal.   Neck:  No deformities, masses, or tenderness noted. Chest Wall:  No deformities, masses, or tenderness noted. Breasts:  skin/areolae normal and no abnormal thickening.   r breast laterally with 2 cm palpable mass with overlying erythema Lungs:  normal respiratory effort and no intercostal retractions.   Heart:  normal rate and regular rhythm.   Abdomen:  soft and non-tender.     Impression & Recommendations:  Problem # 1:  BREAST MASS (ICD-611.72) new onset i suspect this is infectious---infected cyst perhaps antibiotic side effects discussed imaging strudies ordered Orders: Radiology Referral (Radiology)  Complete Medication List: 1)  Prevacid 30 Mg Cpdr (Lansoprazole) .... Take 1 capsule by mouth once a day 2)  Plavix 75 Mg Tabs (Clopidogrel bisulfate) .... Take one tab once daily 3)  Aspirin Ec 325 Mg Tbec (Aspirin) .... Every bedtime 4)  Fish Oil Oil (Fish oil) .Marland Kitchen.. 1000 mg three times a day 5)  Crestor 10 Mg Tabs (Rosuvastatin calcium) .... Take 1 tablet by mouth once a day or as directed 6)  Vitamin C 1000 Mg Tabs (Ascorbic acid) .... Two  daily 7)  Fluticasone Propionate 50 Mcg/act Susp (Fluticasone propionate) .... 2  sprays each nostril once daily 8)  Cephalexin 500 Mg Tabs (Cephalexin) .... Take one by mouth 4 times daily  Prescriptions: CEPHALEXIN 500 MG  TABS (CEPHALEXIN) take one by mouth 4 times daily  #28 x 0   Entered and Authorized by:   Birdie Sons MD   Signed by:   Birdie Sons MD on 08/05/2009   Method used:   [facility] to        [facility] [address] [phone]* (retail)       [address]       [place], [place]  [address]       Ph: [phone] or [phone]        Fax: [phone]   RxID:   [id number]

## 2010-03-04 NOTE — Progress Notes (Signed)
Summary: question  Phone Note Call from Patient Call back at Home Phone [phone]   Caller: Patient live phone call Call For: Birdie Sons MD Summary of Call: ok to do bone scan when has mammogram? Has not had one in years. Initial call taken by: Gladis Riffle, RN,  August 06, 2009 10:51 AM  Follow-up for Phone Call        ok  Follow-up by: Birdie Sons MD,  August 06, 2009 4:30 PM  Additional Follow-up for Phone Call Additional follow up Details #1::        order in process.  Pt will await that this is ok. Additional Follow-up by: Gladis Riffle, RN,  August 07, 2009 7:40 AM

## 2010-03-04 NOTE — Assessment & Plan Note (Signed)
Summary: 4 month rov/njr/pt rescd//ccm   Vital Signs:  Patient profile:   77 year old female Weight:      204 pounds BMI:     32.55 Temp:     98.3 degrees F oral Pulse rate:   60 / minute Pulse rhythm:   regular Resp:     12 per minute BP sitting:   126 / 62  (left arm) Cuff size:   regular  Vitals Entered By: Gladis Riffle, RN (Jun 25, 2009 11:12 AM) CC: 4 month rov, labs done Is Patient Diabetic? No   CC:  4 month rov and labs done.  History of Present Illness:  Follow-Up Visit      This is a 77 year old woman who presents for Follow-up visit.  The patient denies chest pain and palpitations.  Since the last visit the patient notes no new problems or concerns except for generalized aches and pains. Worst area is left sided neck pain. Seems to be worse with activity related to yard work.  The patient reports taking meds as prescribed.  When questioned about possible medication side effects, the patient notes none.    All other systems reviewed and were negative   Preventive Screening-Counseling & Management  Alcohol-Tobacco     Smoking Status: quit     Year Quit: 1998  Current Problems (verified): 1)  Preventive Health Care  (ICD-V70.0) 2)  Obesity  (ICD-278.00) 3)  Hypertension  (ICD-401.9) 4)  Cva  (ICD-434.91) 5)  Hyperlipidemia  (ICD-272.4) 6)  Gerd  (ICD-530.81)  Current Medications (verified): 1)  Prevacid 30 Mg  Cpdr (Lansoprazole) .... Take 1 Capsule By Mouth Once A Day 2)  Plavix 75 Mg Tabs (Clopidogrel Bisulfate) .... Take One Tab Once Daily 3)  Aspirin Ec 325 Mg Tbec (Aspirin) .... Every Bedtime 4)  Fish Oil  Oil (Fish Oil) .Marland Kitchen.. 1000 Mg Three Times A Day 5)  Crestor 10 Mg Tabs (Rosuvastatin Calcium) .... Take 1 Tablet By Mouth Once A Day or As Directed 6)  Vitamin C 1000 Mg Tabs (Ascorbic Acid) .... Two  Daily 7)  Fluticasone Propionate 50 Mcg/act  Susp (Fluticasone Propionate) .... 2 Sprays Each Nostril Once Daily  Allergies: 1)  ! Benadryl-D  Allergy/sinus Child 2)  ! Doxycycline 3)  ! Simvastatin  Past History:  Past Medical History: Last updated: 09/24/2007 GERD Hyperlipidemia shingles Hypertension Cerebrovascular accident, hx of  Past Surgical History: Last updated: 11/21/2005 Cholecystectomy  Family History: Last updated: 2007-01-04 father deceased--heart problems 82 yo mother 87 yo-living  Social History: Last updated: 10/28/2008 Married Former Smoker--quit 1998 Regular exercise-no  Risk Factors: Exercise: no (04-Jan-2007)  Risk Factors: Smoking Status: quit (06/25/2009)  Physical Exam  General:  alert and well-developed.   Head:  normocephalic and atraumatic.   Eyes:  pupils equal and pupils round.   Ears:  R ear normal and L ear normal.   Neck:  No deformities, masses, or tenderness noted. Chest Wall:  No deformities, masses, or tenderness noted. Lungs:  Normal respiratory effort, chest expands symmetrically. Lungs are clear to auscultation, no crackles or wheezes. Heart:  normal rate and regular rhythm.   Abdomen:  soft and non-tender.   Msk:  No deformity or scoliosis noted of thoracic or lumbar spine.   Neurologic:  cranial nerves II-XII intact and gait normal.   Skin:  turgor normal and color normal.   Psych:  normally interactive and good eye contact.     Impression & Recommendations:  Problem # 1:  OBESITY (ICD-278.00) she desperately needs to lose weight have discussed need for DAILY exercise low calorie diet.  Ht: 66.5 (04/08/2008)   Wt: 204 (06/25/2009)   BMI: 32.55 (06/25/2009)  Problem # 2:  HYPERTENSION (ICD-401.9) no meds and BP adequately controlled BP today: 126/62 Prior BP: 128/76 (02/25/2009)  Labs Reviewed: K+: 5.0 (06/18/2009) Creat: : 0.9 (06/18/2009)   Chol: 175 (06/18/2009)   HDL: 57.80 (06/18/2009)   LDL: 99 (06/18/2009)   TG: 91.0 (06/18/2009)  Problem # 3:  CVA (ICD-434.91) no recurrence continue current medications  Her updated medication list for this  problem includes:    Plavix 75 Mg Tabs (Clopidogrel bisulfate) .Marland Kitchen... Take one tab once daily    Aspirin Ec 325 Mg Tbec (Aspirin) ..... Every bedtime  Problem # 4:  HYPERLIPIDEMIA (ICD-272.4) reasonable control continue current medications  Her updated medication list for this problem includes:    Crestor 10 Mg Tabs (Rosuvastatin calcium) .Marland Kitchen... Take 1 tablet by mouth once a day or as directed  Labs Reviewed: SGOT: 20 (10/21/2008)   SGPT: 15 (10/21/2008)   HDL:57.80 (06/18/2009), 46.50 (10/21/2008)  LDL:99 (06/18/2009), 83 (10/21/2008)  Chol:175 (06/18/2009), 145 (10/21/2008)  Trig:91.0 (06/18/2009), 76.0 (10/21/2008)  Problem # 5:  GERD (ICD-530.81) tolerating meds continue current medications as sxs are controlled Her updated medication list for this problem includes:    Prevacid 30 Mg Cpdr (Lansoprazole) .Marland Kitchen... Take 1 capsule by mouth once a day  Problem # 6:  NECK PAIN (ICD-723.1)  refer PT for evaluation and treat Her updated medication list for this problem includes:    Aspirin Ec 325 Mg Tbec (Aspirin) ..... Every bedtime  Orders: Physical Therapy Referral (PT)  Problem # 7:  FATIGUE (ICD-780.79) she admits to fatigue---described as difficulty doing a whole day's worth of yard work she thinks fatigue is out of proportion to age and her wignificant deconditioning check labs Orders: Venipuncture (65784) TLB-BMP (Basic Metabolic Panel-BMET) (80048-METABOL) TLB-Hepatic/Liver Function Pnl (80076-HEPATIC) TLB-CBC Platelet - w/Differential (85025-CBCD) TLB-TSH (Thyroid Stimulating Hormone) (84443-TSH) TLB-Sedimentation Rate (ESR) (85652-ESR)  Complete Medication List: 1)  Prevacid 30 Mg Cpdr (Lansoprazole) .... Take 1 capsule by mouth once a day 2)  Plavix 75 Mg Tabs (Clopidogrel bisulfate) .... Take one tab once daily 3)  Aspirin Ec 325 Mg Tbec (Aspirin) .... Every bedtime 4)  Fish Oil Oil (Fish oil) .Marland Kitchen.. 1000 mg three times a day 5)  Crestor 10 Mg Tabs (Rosuvastatin  calcium) .... Take 1 tablet by mouth once a day or as directed 6)  Vitamin C 1000 Mg Tabs (Ascorbic acid) .... Two  daily 7)  Fluticasone Propionate 50 Mcg/act Susp (Fluticasone propionate) .... 2 sprays each nostril once daily  Patient Instructions: 1)  Please schedule a follow-up appointment in 4 months.

## 2010-03-04 NOTE — Miscellaneous (Signed)
Summary: Discharge Summary for PT [facility]  Discharge Summary for PT Services/[facility]   Imported By: Maryln Gottron 08/21/2009 10:33:43  _____________________________________________________________________  External Attachment:    Type:   Image     Comment:   External Document

## 2010-03-04 NOTE — Assessment & Plan Note (Signed)
Summary: 4 MONTH ROV/NJR  PT RSC/NJR--Rm 13   Vital Signs:  Patient profile:   77 year old female Height:      66.5 inches Weight:      208 pounds BMI:     33.19 Temp:     98.5 degrees F oral Pulse rate:   66 / minute Pulse rhythm:   regular Resp:     18 per minute BP sitting:   130 / 80  (left arm) Cuff size:   large  Vitals Entered By: Mervin Kung CMA Duncan Dull) (November 11, 2009 10:56 AM) CC: Rm 13  4 month f/u. Is Patient Diabetic? No Pain Assessment Patient in pain? no      Comments Pt only taking Fish Oil 1 once daily. All other med doses and directions are correct. Nicki Guadalajara Fergerson CMA (AAMA)  November 11, 2009 11:02 AM    CC:  Rm 13  4 month f/u.Marland Kitchen  History of Present Illness:  Follow-Up Visit      This is a 76 year old woman who presents for Follow-up visit.  The patient denies chest pain and palpitations.  Since the last visit the patient notes no new problems or concerns.  The patient reports taking meds as prescribed.  When questioned about possible medication side effects, the patient notes none.  not trying to lose weight  All other systems reviewed and were negative   Current Problems (verified): 1)  Preventive Health Care  (ICD-V70.0) 2)  Obesity  (ICD-278.00) 3)  Hypertension  (ICD-401.9) 4)  Cva  (ICD-434.91) 5)  Hyperlipidemia  (ICD-272.4) 6)  Gerd  (ICD-530.81)  Current Medications (verified): 1)  Prevacid 30 Mg  Cpdr (Lansoprazole) .... Take 1 Capsule By Mouth Once A Day 2)  Plavix 75 Mg Tabs (Clopidogrel Bisulfate) .... Take One Tab Once Daily 3)  Aspirin Ec 325 Mg Tbec (Aspirin) .... Every Bedtime 4)  Fish Oil  Oil (Fish Oil) .Marland Kitchen.. 1000 Mg Three Times A Day 5)  Crestor 10 Mg Tabs (Rosuvastatin Calcium) .... Take 1 Tablet By Mouth Once A Day or As Directed 6)  Vitamin C 1000 Mg Tabs (Ascorbic Acid) .... Two  Daily 7)  Fluticasone Propionate 50 Mcg/act  Susp (Fluticasone Propionate) .... 2 Sprays Each Nostril Once Daily  Allergies: 1)  !  Benadryl-D Allergy/sinus Child 2)  ! Doxycycline 3)  ! Simvastatin  Physical Exam  General:  alert and well-developed.   Head:  normocephalic and atraumatic.   Eyes:  pupils equal and pupils round.   Ears:  R ear normal and L ear normal.   Neck:  No deformities, masses, or tenderness noted. Chest Wall:  No deformities, masses, or tenderness noted. Lungs:  Normal respiratory effort, chest expands symmetrically. Lungs are clear to auscultation, no crackles or wheezes. Heart:  normal rate and regular rhythm.   Abdomen:  soft and non-tender.   Skin:  turgor normal and color normal.   Psych:  normally interactive and good eye contact.     Impression & Recommendations:  Problem # 1:  OBESITY (ICD-278.00) discussed need for aggressive weight loss Ht: 66.5 (11/11/2009)   Wt: 208 (11/11/2009)   BMI: 33.19 (11/11/2009)  Problem # 2:  HYPERTENSION (ICD-401.9) no meds BP ok BP today: 130/80 Prior BP: 148/80 (08/05/2009)  Labs Reviewed: K+: 4.3 (06/25/2009) Creat: : 1.1 (06/25/2009)   Chol: 175 (06/18/2009)   HDL: 57.80 (06/18/2009)   LDL: 99 (06/18/2009)   TG: 91.0 (06/18/2009)  Problem # 3:  GERD (ICD-530.81) controlled continue  current medications  Her updated medication list for this problem includes:    Prevacid 30 Mg Cpdr (Lansoprazole) .Marland Kitchen... Take 1 capsule by mouth once a day  Problem # 4:  HYPERLIPIDEMIA (ICD-272.4) previously controlled continue current medications  Her updated medication list for this problem includes:    Crestor 10 Mg Tabs (Rosuvastatin calcium) .Marland Kitchen... Take 1 tablet by mouth once a day or as directed  Labs Reviewed: SGOT: 22 (06/25/2009)   SGPT: 16 (06/25/2009)   HDL:57.80 (06/18/2009), 46.50 (10/21/2008)  LDL:99 (06/18/2009), 83 (10/21/2008)  Chol:175 (06/18/2009), 145 (10/21/2008)  Trig:91.0 (06/18/2009), 76.0 (10/21/2008)  Complete Medication List: 1)  Prevacid 30 Mg Cpdr (Lansoprazole) .... Take 1 capsule by mouth once a day 2)  Plavix 75 Mg Tabs  (Clopidogrel bisulfate) .... Take one tab once daily 3)  Aspirin Ec 325 Mg Tbec (Aspirin) .... Every bedtime 4)  Fish Oil Oil (Fish oil) .Marland Kitchen.. 1000 mg three times a day 5)  Crestor 10 Mg Tabs (Rosuvastatin calcium) .... Take 1 tablet by mouth once a day or as directed 6)  Vitamin C 1000 Mg Tabs (Ascorbic acid) .... Two  daily 7)  Fluticasone Propionate 50 Mcg/act Susp (Fluticasone propionate) .... 2 sprays each nostril once daily  Other Orders: Flu Vaccine 96yrs + MEDICARE PATIENTS (Z6109) Administration Flu vaccine - MCR (U0454)  Patient Instructions: 1)  Please schedule a follow-up appointment in 4 months. 2)  lipids 272.4 3)  liver 995.2 4)  bmet---995.2  Current Allergies (reviewed today): ! BENADRYL-D ALLERGY/SINUS CHILD ! DOXYCYCLINE ! SIMVASTATIN Flu Vaccine Consent Questions     Do you have a history of severe allergic reactions to this vaccine? no    Any prior history of allergic reactions to egg and/or gelatin? no    Do you have a sensitivity to the preservative Thimersol? no    Do you have a past history of Guillan-Barre Syndrome? no    Do you currently have an acute febrile illness? no    Have you ever had a severe reaction to latex? no    Vaccine information given and explained to patient? yes    Are you currently pregnant? no    Lot Number:[id number]   Exp Date:07/31/2010   Site Given  Left Deltoid IM.  Nicki Guadalajara Fergerson CMA Duncan Dull)  November 11, 2009 11:09 AM

## 2010-03-04 NOTE — Progress Notes (Signed)
Summary: memory issues  Phone Note Call from Patient Call back at [phone]   Caller: Daughter Michelle [name] Call For: Michelle [name] Summary of Call: pt's husband spoke to you about memory issues and daughter wants to know if you could haver her tested soon Initial call taken by: Alfred Levins, CMA,  December 17, 2009 1:31 PM  Follow-up for Phone Call        scheduled an officevisit with the patient. Follow-up by: Birdie Sons MD,  December 17, 2009 2:28 PM  Additional Follow-up for Phone Call Additional follow up Details #1::        they will call back to make appt Additional Follow-up by: Alfred Levins, CMA,  December 17, 2009 3:30 PM

## 2010-03-10 ENCOUNTER — Other Ambulatory Visit: Payer: PRIVATE HEALTH INSURANCE | Admitting: Internal Medicine

## 2010-03-10 DIAGNOSIS — E785 Hyperlipidemia, unspecified: Secondary | ICD-10-CM

## 2010-03-10 DIAGNOSIS — T887XXA Unspecified adverse effect of drug or medicament, initial encounter: Secondary | ICD-10-CM

## 2010-03-10 LAB — BASIC METABOLIC PANEL
Chloride: 107 mEq/L (ref 96–112)
Creatinine, Ser: 0.8 mg/dL (ref 0.4–1.2)
Potassium: 4.5 mEq/L (ref 3.5–5.1)
Sodium: 142 mEq/L (ref 135–145)

## 2010-03-10 LAB — HEPATIC FUNCTION PANEL
Albumin: 3.7 g/dL (ref 3.5–5.2)
Alkaline Phosphatase: 81 U/L (ref 39–117)
Total Protein: 6.6 g/dL (ref 6.0–8.3)

## 2010-03-10 LAB — LIPID PANEL
LDL Cholesterol: 89 mg/dL (ref 0–99)
Total CHOL/HDL Ratio: 3

## 2010-03-16 ENCOUNTER — Encounter: Payer: Self-pay | Admitting: Internal Medicine

## 2010-03-17 ENCOUNTER — Encounter: Payer: Self-pay | Admitting: Internal Medicine

## 2010-03-17 ENCOUNTER — Ambulatory Visit (INDEPENDENT_AMBULATORY_CARE_PROVIDER_SITE_OTHER): Payer: PRIVATE HEALTH INSURANCE | Admitting: Internal Medicine

## 2010-03-17 DIAGNOSIS — K219 Gastro-esophageal reflux disease without esophagitis: Secondary | ICD-10-CM

## 2010-03-17 DIAGNOSIS — I6350 Cerebral infarction due to unspecified occlusion or stenosis of unspecified cerebral artery: Secondary | ICD-10-CM

## 2010-03-17 MED ORDER — LISINOPRIL 10 MG PO TABS: 10.0000 mg | ORAL_TABLET | Freq: Every day | ORAL | Status: DC

## 2010-03-17 NOTE — Assessment & Plan Note (Signed)
No recurrent sxs---continue same meds Risk factor modification is the key

## 2010-03-17 NOTE — Patient Instructions (Addendum)
Monitor your blood pressure at home. Goal BP < 135/85

## 2010-03-17 NOTE — Assessment & Plan Note (Signed)
Tolerating meds and not having any sxs

## 2010-03-17 NOTE — Progress Notes (Signed)
  Subjective:    Patient ID: Michelle [name], female    DOB: 03/07/1932, 77 y.o.   MRN: [id number]  HPI  Here for f/u: she admits to a lot of external stressors (taking care of husband's nonhealing wound) F/u of lipids, htn, gerd---tolerating meds without difficulty Hx of CVA---no recurrent sxs   Past Medical History  Diagnosis Date  . GERD (gastroesophageal reflux disease)   . Hyperlipidemia   . Shingles   . Hypertension   . History of cerebrovascular accident    Past Surgical History  Procedure Date  . Cholecystectomy     reports that she quit smoking about 14 years ago. She does not have any smokeless tobacco history on file. Her alcohol and drug histories not on file. family history is not on file.  Allergies  Allergen Reactions  . Doxycycline     REACTION: acid reflux  . Simvastatin     REACTION: memory loss      Review of Systems  patient denies chest pain, shortness of breath, orthopnea. Denies lower extremity edema, abdominal pain, change in appetite, change in bowel movements. Patient denies rashes, musculoskeletal complaints. No other specific complaints in a complete review of systems except has noted rare lightheadedness over the past several weeks ("mild")     Objective:   Physical Exam  Well-developed well-nourished female in no acute distress. HEENT exam atraumatic, normocephalic, extraocular muscles are intact. Neck is supple. No jugular venous distention no thyromegaly. Chest clear to auscultation without increased work of breathing. Cardiac exam S1 and S2 are regular. Abdominal exam active bowel sounds, soft, nontender. Extremities no edema. Neurologic exam she is alert without any motor sensory deficits. Gait is normal.       Assessment & Plan:

## 2010-03-19 ENCOUNTER — Telehealth: Payer: Self-pay | Admitting: Internal Medicine

## 2010-03-19 NOTE — Telephone Encounter (Signed)
Triage vm------left ribcage is sore. Not really coughing, just sneezing. Using a new Robintussin Nasal Relief, the daytime and the nighttime. It has acetominphen and other ingredients. Please advice of the pain.

## 2010-03-21 ENCOUNTER — Encounter: Payer: Self-pay | Admitting: Internal Medicine

## 2010-03-24 NOTE — Telephone Encounter (Signed)
She is probably doing the right thing... Acetaminophen for discomfort

## 2010-03-24 NOTE — Telephone Encounter (Signed)
LMTCB

## 2010-03-25 NOTE — Telephone Encounter (Signed)
Pt aware.

## 2010-04-14 ENCOUNTER — Ambulatory Visit: Payer: PRIVATE HEALTH INSURANCE | Admitting: Internal Medicine

## 2010-04-28 ENCOUNTER — Ambulatory Visit (INDEPENDENT_AMBULATORY_CARE_PROVIDER_SITE_OTHER): Payer: PRIVATE HEALTH INSURANCE | Admitting: Internal Medicine

## 2010-04-28 ENCOUNTER — Encounter: Payer: Self-pay | Admitting: Internal Medicine

## 2010-04-28 DIAGNOSIS — I10 Essential (primary) hypertension: Secondary | ICD-10-CM

## 2010-04-28 DIAGNOSIS — I6350 Cerebral infarction due to unspecified occlusion or stenosis of unspecified cerebral artery: Secondary | ICD-10-CM

## 2010-04-28 DIAGNOSIS — K219 Gastro-esophageal reflux disease without esophagitis: Secondary | ICD-10-CM

## 2010-04-28 DIAGNOSIS — E785 Hyperlipidemia, unspecified: Secondary | ICD-10-CM

## 2010-04-28 DIAGNOSIS — J45909 Unspecified asthma, uncomplicated: Secondary | ICD-10-CM

## 2010-04-28 MED ORDER — FLUTICASONE PROPIONATE (INHAL) 50 MCG/BLIST IN AEPB: 2.0000 | INHALATION_SPRAY | Freq: Every day | RESPIRATORY_TRACT | Status: DC

## 2010-04-28 NOTE — Progress Notes (Signed)
  Subjective:    Patient ID: Michelle [name], female    DOB: [birth year], 77 y.o.   MRN: [id number]  HPI  Patient comes in for evaluation of multiple medical problems including hypertension, stroke, hyperlipidemia, gastroesophageal reflux disease. She is currently feeling well. She is tolerating her medications well. No recurrent evidence of a stroke. She is not having symptoms related to GERD.  Past Medical History  Diagnosis Date  . GERD (gastroesophageal reflux disease)   . Hyperlipidemia   . Shingles   . Hypertension   . History of cerebrovascular accident    Past Surgical History  Procedure Date  . Cholecystectomy     reports that she quit smoking about 14 years ago. She does not have any smokeless tobacco history on file. Her alcohol and drug histories not on file. family history is not on file. Allergies  Allergen Reactions  . Doxycycline     REACTION: acid reflux  . Simvastatin     REACTION: memory loss     Review of Systems    patient denies chest pain, shortness of breath, orthopnea. Denies lower extremity edema, abdominal pain, change in appetite, change in bowel movements. Patient denies rashes, musculoskeletal complaints. No other specific complaints in a complete review of systems.    Objective:   Physical Exam  Well-developed well-nourished female in no acute distress. HEENT exam atraumatic, normocephalic, extraocular muscles are intact. Neck is supple. No jugular venous distention no thyromegaly. Chest clear to auscultation without increased work of breathing. Cardiac exam S1 and S2 are regular. Abdominal exam active bowel sounds, soft, nontender. Extremities no edema. Neurologic exam she is alert without any motor sensory deficits. Gait is normal.        Assessment & Plan:

## 2010-04-28 NOTE — Assessment & Plan Note (Signed)
No recurrent symptoms. Continued risk factor modification.

## 2010-04-28 NOTE — Assessment & Plan Note (Signed)
Well-controlled. Continue current medications. She's not having any significant side effects.

## 2010-04-28 NOTE — Assessment & Plan Note (Signed)
Symptoms well controlled on current medications. 

## 2010-04-28 NOTE — Assessment & Plan Note (Signed)
Previously controlled. Continue current medications. 

## 2010-05-04 ENCOUNTER — Other Ambulatory Visit: Payer: Self-pay | Admitting: *Deleted

## 2010-05-04 DIAGNOSIS — Z889 Allergy status to unspecified drugs, medicaments and biological substances status: Secondary | ICD-10-CM

## 2010-05-04 MED ORDER — FLUTICASONE PROPIONATE 50 MCG/ACT NA SUSP: 2.0000 | Freq: Every day | NASAL | Status: DC

## 2010-06-05 ENCOUNTER — Other Ambulatory Visit: Payer: Self-pay | Admitting: Internal Medicine

## 2010-06-15 NOTE — H&P (Signed)
Michelle [name], Michelle [name]                  ACCOUNT NO.:  [id number]   MEDICAL RECORD NO.:  [id number]          PATIENT TYPE:  INP   LOCATION:  3712                         FACILITY:  [facility]   PHYSICIAN:  Sean A. Everardo All, MD    DATE OF BIRTH:  1932-03-15   DATE OF ADMISSION:  09/11/2007  DATE OF DISCHARGE:                              HISTORY & PHYSICAL   REASON FOR ADMISSION:  CVA.   HISTORY OF PRESENT ILLNESS:  A 77 year old woman with 2 days of  intermittent difficulty walking, such that she is constantly brushing up  against the wall on her left.  She denies any associated focal muscle  weakness.  She describes her symptoms as moderate.   PAST MEDICAL HISTORY:  1. Dyslipidemia.  2. GERD.  3. History of zoster.  4. Question of memory loss in the past.   MEDICATIONS:  Prevacid 30 mg a day.   SOCIAL HISTORY:  The patient is married and retired.  She is with a son-  in-law and grandson, and her husband is not in the room just now.  She  does not smoke and seldom consumes alcohol.   FAMILY HISTORY:  Negative for the above.   REVIEW OF SYSTEMS:  Denies the following:  Fever, loss of consciousness,  weight loss, sore throat, earache, cough, chest pain, shortness of  breath, abdominal pain, rectal bleeding, hematuria, vaginal bleeding,  and skin rash.  She has slight headache and nausea for the past few  days.   PHYSICAL EXAMINATION:  VITAL SIGNS:  Blood pressure 162/56, heart rate  55, respiratory rate 18, and temperature is 97.2.  GENERAL:  No distress.  She is examined in the supine position only.  SKIN:  No rash, not diaphoretic.  HEENT:  No proptosis.  No periorbital swelling.  No evidence of head  trauma to external examination.  Pharynx is normal.  NECK:  Supple.  CHEST:  Clear to auscultation.  No respiratory distress.  CARDIOVASCULAR:  No edema.  Bradycardic.  Regular rhythm.  No murmur.  Pedal pulses are intact.  ABDOMEN:  Soft and nontender.  No hepatosplenomegaly.  No  mass.  BREASTS, GYNECOLOGIC, AND RECTAL:  Not done at this time due to the  patient's condition.  EXTREMITIES:  Minimal osteoarthritic changes are present.  NEUROLOGIC:  She is alert and well oriented.  Cranial nerves appear to  be intact.  She readily moves all 4s and sensation is diffusely intact  to touch.  She is examined supine position only, but she says she walked  uneventfully down the hall several minutes prior to my examination.   LABORATORY STUDIES:  Head CT shows a cerebellar infarction, of uncertain  age.  CBC and BMET are unremarkable.  Initial cardiac enzymes are  negative.   IMPRESSION:  1. Cerebrovascular accident of uncertain age.  2. Ataxia question related to cerebrovascular accident.  3. Bradycardia.  The relationship of this to ataxia is uncertain.  4. Hypertension.  This is probably situational and may be reflex      hypertension due to her cerebrovascular accident.   PLAN:  1. Admit to telemetry.  2. Consult Neurology tomorrow.  3. Aggrenox.  4. Symptomatic therapy.  5. Observe heart rate on monitor for now.      Sean A. Everardo All, MD  Electronically Signed     SAE/MEDQ  D:  09/11/2007  T:  09/12/2007  Job:  [id number]

## 2010-06-15 NOTE — Discharge Summary (Signed)
[name], [name] NO.:  [id number]   MEDICAL RECORD NO.:  [id number]          PATIENT TYPE:  INP   LOCATION:  3712                         FACILITY:  [facility]   PHYSICIAN:  Valerie A. Felicity Coyer, MDDATE OF BIRTH:  05/21/1932   DATE OF ADMISSION:  09/11/2007  DATE OF DISCHARGE:  09/13/2007                               DISCHARGE SUMMARY   DISCHARGE DIAGNOSES:  1. Left cerebellar cerebrovascular accident with ataxia.  2. Hyperlipidemia.   HISTORY OF PRESENT ILLNESS:  Michelle [name] is a very pleasant 77 year old  white female with past medical history of dyslipidemia and GERD who  presented to [facility] Room on day of admission with reports  of difficulty walking.  The patient and husband describe the patient  constantly brushing up against wall on her left side.  The patient  denied any associated focal weakness.  Of note, during this  hospitalization, the patient's mother hospitalized on 4500 and passed  away.  Due to the patient's risk for stroke, given history of  hyperlipidemia, the patient was admitted for further evaluation and  treatment.   PAST MEDICAL HISTORY:  1. Dyslipidemia.  2. GERD.  3. History of herpes zoster.  4. Questionable history of memory loss in the past.   COURSE OF HOSPITALIZATION:  1. Acute cerebellar CVA.  The patient underwent a CT of the head at      the time of admission revealing mild microvascular disease with      questionable left cerebellar infarct, age indeterminate.  Due to CT      findings, the patient underwent MRA and MRI of the brain, revealing      acute left cerebellar infarct in the PICA territory with no      associated mass or hemorrhage.  The patient also underwent      bilateral carotid Doppler studies, which were negative for any ICA      stenosis.  A 2D echo performed during this hospitalization was      within normal limits with ejection fraction of 60% and no cardiac      source or emboli detected.   Initially, the patient was placed on      Aggrenox b.i.d., however, the patient unable to tolerate this      medication due to nausea and headache.  Therefore, at the time of      discharge, the patient will be placed on Plavix and full-dose      aspirin.  2. Hyperlipidemia.  The patient refuses statin therapy due to side      effects in the past.  She is willing to try fish oil 2 tablets      daily.  We will recommend followup fasting lipid panel in      approximately 3 months per primary care physician to determine      whether or not the patient will need cholesterol lowering      medication such as WelChol or niacin.   DISCHARGE MEDICATIONS:  1. Plavix 75 mg p.o. daily.  2. Aspirin 325 mg p.o. daily.  3.  Prevacid 30 mg p.o. daily.  4. Fish oil 2 tablets p.o. daily.   DISPOSITION:  The patient felt medically stable for discharge home at  this time.  At the time of dictation, the patient currently undergoing  assessment by physical therapy.  We will arrange any outpatient followup  per their recommendations.  The patient is instructed to follow up with  her primary care physician, Dr. Birdie Sons, on Monday, September 24, 2007, at 11:00 a.m.      Cordelia Pen, NP      Raenette Rover. Felicity Coyer, MD  Electronically Signed    LE/MEDQ  D:  09/13/2007  T:  09/14/2007  Job:  [id number]   cc:   Valetta Mole. Swords, MD

## 2010-06-18 NOTE — Assessment & Plan Note (Signed)
[facility]                                   ON-CALL NOTE   NAME:[name], [name]                         MRN:          [id number]  DATE:10/08/2005                            DOB:          07-Mar-1932    Telephone triage note on September 8, time received 2:03 p.m.  The patient  is Michelle [name], the caller is the same.  She sees Dr. Cato Mulligan.  Date of birth  December 10, 1942, telephone [phone].  The patient is currently on  acyclovir and Lidoderm patches for shingles down her right arm.  She has  been using this for several days.  She has also been on cyclobenzaprine and  Vicodin for a pinched nerve, which is causing pain down the same arm.  She  says that using the patches seemed to arm burn and hurt worse than before.  She wants to know if it is okay to stop them.  My answer is to go ahead and  discontinue the Lidoderm patches.  She should continue all of her other  medications, and she can follow up with Dr. Cato Mulligan as needed.                                   Tera Mater. Clent Ridges, MD   SAF/MedQ  DD:  10/08/2005  DT:  10/09/2005  Job #:  [id number]

## 2010-08-24 ENCOUNTER — Other Ambulatory Visit (INDEPENDENT_AMBULATORY_CARE_PROVIDER_SITE_OTHER): Payer: PRIVATE HEALTH INSURANCE

## 2010-08-24 DIAGNOSIS — E785 Hyperlipidemia, unspecified: Secondary | ICD-10-CM

## 2010-08-24 DIAGNOSIS — I10 Essential (primary) hypertension: Secondary | ICD-10-CM

## 2010-08-24 LAB — LIPID PANEL
Cholesterol: 147 mg/dL (ref 0–200)
HDL: 48.7 mg/dL (ref >39.00)
Total CHOL/HDL Ratio: 3
Triglycerides: 134 mg/dL (ref 0.0–149.0)

## 2010-08-24 LAB — BASIC METABOLIC PANEL
BUN: 9 mg/dL (ref 6–23)
Calcium: 9.3 mg/dL (ref 8.4–10.5)
GFR: 59.39 mL/min — ABNORMAL LOW (ref >60.00)
Potassium: 4.2 mEq/L (ref 3.5–5.1)
Sodium: 144 mEq/L (ref 135–145)

## 2010-08-24 LAB — HEPATIC FUNCTION PANEL
ALT: 13 U/L (ref 0–35)
AST: 18 U/L (ref 0–37)
Albumin: 3.9 g/dL (ref 3.5–5.2)
Alkaline Phosphatase: 75 U/L (ref 39–117)
Total Protein: 7.1 g/dL (ref 6.0–8.3)

## 2010-08-31 ENCOUNTER — Ambulatory Visit: Payer: PRIVATE HEALTH INSURANCE | Admitting: Internal Medicine

## 2010-09-24 ENCOUNTER — Encounter: Payer: Self-pay | Admitting: Internal Medicine

## 2010-10-06 ENCOUNTER — Ambulatory Visit: Payer: PRIVATE HEALTH INSURANCE | Admitting: Internal Medicine

## 2010-10-06 ENCOUNTER — Encounter: Payer: Self-pay | Admitting: Internal Medicine

## 2010-10-06 ENCOUNTER — Ambulatory Visit (INDEPENDENT_AMBULATORY_CARE_PROVIDER_SITE_OTHER): Payer: PRIVATE HEALTH INSURANCE | Admitting: Internal Medicine

## 2010-10-06 DIAGNOSIS — E669 Obesity, unspecified: Secondary | ICD-10-CM

## 2010-10-06 DIAGNOSIS — I10 Essential (primary) hypertension: Secondary | ICD-10-CM

## 2010-10-06 DIAGNOSIS — E785 Hyperlipidemia, unspecified: Secondary | ICD-10-CM

## 2010-10-06 DIAGNOSIS — I6350 Cerebral infarction due to unspecified occlusion or stenosis of unspecified cerebral artery: Secondary | ICD-10-CM

## 2010-10-06 DIAGNOSIS — Z23 Encounter for immunization: Secondary | ICD-10-CM

## 2010-10-06 NOTE — Assessment & Plan Note (Signed)
Well controlled Continue same meds...however if cough persists may need to reconsider lisinopril

## 2010-10-06 NOTE — Assessment & Plan Note (Signed)
No recurrent sxs Risk factor modification is key

## 2010-10-06 NOTE — Progress Notes (Signed)
  Subjective:    Patient ID: Michelle [name], female    DOB: [birth year], 77 y.o.   MRN: [id number]  HPI  Patient Active Problem List  Diagnoses  . HYPERLIPIDEMIA---tolerating meds without difficulty  . OBESITY---not trying to lose weight  . HYPERTENSION---no home bps, she complains of a scratchy throat and cough  . CVA--no recurrent sxs  . GERD--tolerating PPI and having no sxs   Past Medical History  Diagnosis Date  . GERD (gastroesophageal reflux disease)   . Hyperlipidemia   . Shingles   . Hypertension   . History of cerebrovascular accident    Past Surgical History  Procedure Date  . Cholecystectomy     reports that she quit smoking about 14 years ago. She does not have any smokeless tobacco history on file. Her alcohol and drug histories not on file. family history is not on file. Allergies  Allergen Reactions  . Doxycycline     REACTION: acid reflux  . Simvastatin     REACTION: memory loss     Review of Systems  patient denies chest pain, shortness of breath, orthopnea. Denies lower extremity edema, abdominal pain, change in appetite, change in bowel movements. Patient denies rashes, musculoskeletal complaints. No other specific complaints in a complete review of systems.      Objective:   Physical Exam   Well-developed well-nourished female in no acute distress. HEENT exam atraumatic, normocephalic, extraocular muscles are intact. Neck is supple. No jugular venous distention no thyromegaly. Chest clear to auscultation without increased work of breathing. Cardiac exam S1 and S2 are regular. Abdominal exam active bowel sounds, soft, nontender. Extremities no edema. Neurologic exam she is alert without any motor sensory deficits. Gait is normal.       Assessment & Plan:

## 2010-10-06 NOTE — Assessment & Plan Note (Signed)
Advised aggressive weight loss 

## 2010-10-06 NOTE — Patient Instructions (Addendum)
Call your insurance company and see if they will cover shingles vaccine. If they will, call us and we will give it to you Plantar Fasciitis Plantar fasciitis is a common condition that causes foot pain. It is soreness (inflammation) of the band of tough fibrous tissue on the bottom of the foot that runs from the heel bone (calcaneus) to the ball of the foot. The cause of this soreness may be from excessive standing, poor fitting shoes, running on hard surfaces, being overweight, having an abnormal walk, or overuse (this is common in runners) of the painful foot or feet. It is also common in aerobic exercise dancers and ballet dancers.  SYMPTOMS Most people with plantar fasciitis complain of:  Severe pain in the morning on the bottom of their foot especially when taking the first steps out of bed. This pain recedes after a few minutes of walking.   Severe pain is experienced also during walking following a long period of inactivity.   Pain is worse when walking barefoot or up stairs  DIAGNOSIS  Your caregiver will diagnose this condition by examining and feeling your foot.   Special tests such as x-rays of your foot, are usually not needed.   PREVENTION injuries common to runners. There is less impact and less jarring of the joints.   Begin all new exercise programs slowly. If problems or pain develop, decrease the amount of time or distance until you are at a comfortable level.   Wear good shoes and replace them regularly.   Stretch your foot and the heel cords at the back of the ankle (Achilles tendon) both before and after exercise.   Run or exercise on even surfaces that are not hard. For example, asphalt is better than pavement.   Do not run barefoot on hard surfaces.   If using a treadmill, vary the incline.   Do not continue to workout if you have foot or joint problems. Seek professional help if they do not improve.  HOME CARE INSTRUCTIONS  Avoid activities that cause you  pain until you recover.   Use ice or cold packs on the problem or painful areas after working out.   Soft shoe inserts or athletic shoes with air or gel sole cushions may be helpful.   If problems continue or become more severe, consult a sports medicine caregiver or your own health care provider. Cortisone is a potent anti-inflammatory medication that may be injected into the painful area. You can discuss this treatment with your caregiver.  MAKE SURE YOU:  Understand these instructions.   Will watch your condition.   Will get help right away if you are not doing well or get worse.  Document Released: 10/12/2000 Document Re-Released: 04/13/2009 [facility] Patient Information 2011 [place], [place].

## 2010-10-06 NOTE — Assessment & Plan Note (Signed)
Controlled Continue same meds 

## 2010-10-29 LAB — DIFFERENTIAL
Basophils Absolute: 0.1
Eosinophils Relative: 1
Lymphocytes Relative: 26
Lymphs Abs: 1.9
Neutrophils Relative %: 64

## 2010-10-29 LAB — CBC
HCT: 38.9
Platelets: 271
RDW: 12.3
WBC: 7.4

## 2010-10-29 LAB — LIPID PANEL
Cholesterol: 202 — ABNORMAL HIGH
LDL Cholesterol: 133 — ABNORMAL HIGH
Triglycerides: 142
VLDL: 28

## 2010-10-29 LAB — POCT I-STAT, CHEM 8
BUN: 7
Calcium, Ion: 1.29
Creatinine, Ser: 1
Sodium: 142
TCO2: 31

## 2010-10-29 LAB — POCT CARDIAC MARKERS: CKMB, poc: 2.2

## 2010-11-11 ENCOUNTER — Other Ambulatory Visit: Payer: Self-pay | Admitting: Internal Medicine

## 2010-11-18 ENCOUNTER — Other Ambulatory Visit: Payer: Self-pay | Admitting: *Deleted

## 2010-11-18 MED ORDER — ZOSTER VACCINE LIVE 19400 UNT/0.65ML ~~LOC~~ SOLR: 0.6500 mL | Freq: Once | SUBCUTANEOUS | Status: DC

## 2010-12-08 ENCOUNTER — Ambulatory Visit (INDEPENDENT_AMBULATORY_CARE_PROVIDER_SITE_OTHER): Payer: PRIVATE HEALTH INSURANCE | Admitting: *Deleted

## 2010-12-08 DIAGNOSIS — Z23 Encounter for immunization: Secondary | ICD-10-CM

## 2010-12-16 ENCOUNTER — Other Ambulatory Visit: Payer: Self-pay | Admitting: Radiology

## 2010-12-16 DIAGNOSIS — Z803 Family history of malignant neoplasm of breast: Secondary | ICD-10-CM

## 2010-12-30 ENCOUNTER — Ambulatory Visit
Admission: RE | Admit: 2010-12-30 | Discharge: 2010-12-30 | Disposition: A | Discharge status: Home / Self Care | Payer: PRIVATE HEALTH INSURANCE | Source: Ambulatory Visit | Attending: Radiology | Admitting: Radiology

## 2010-12-30 DIAGNOSIS — Z803 Family history of malignant neoplasm of breast: Secondary | ICD-10-CM

## 2010-12-30 MED ORDER — GADOBENATE DIMEGLUMINE 529 MG/ML IV SOLN
19.0000 mL | Freq: Once | INTRAVENOUS | Status: AC | PRN
  Administered 2010-12-30: 19 mL via INTRAVENOUS

## 2011-01-07 ENCOUNTER — Other Ambulatory Visit: Payer: Self-pay | Admitting: Radiology

## 2011-01-07 DIAGNOSIS — R928 Other abnormal and inconclusive findings on diagnostic imaging of breast: Secondary | ICD-10-CM

## 2011-01-24 ENCOUNTER — Encounter: Payer: Self-pay | Admitting: Internal Medicine

## 2011-02-01 DIAGNOSIS — C50919 Malignant neoplasm of unspecified site of unspecified female breast: Secondary | ICD-10-CM

## 2011-02-01 HISTORY — DX: Malignant neoplasm of unspecified site of unspecified female breast: C50.919

## 2011-02-11 ENCOUNTER — Ambulatory Visit
Admission: RE | Admit: 2011-02-11 | Discharge: 2011-02-11 | Disposition: A | Discharge status: Home / Self Care | Payer: PRIVATE HEALTH INSURANCE | Source: Ambulatory Visit | Attending: Radiology | Admitting: Radiology

## 2011-02-11 ENCOUNTER — Other Ambulatory Visit: Payer: Self-pay | Admitting: Radiology

## 2011-02-11 DIAGNOSIS — R928 Other abnormal and inconclusive findings on diagnostic imaging of breast: Secondary | ICD-10-CM

## 2011-02-11 HISTORY — PX: BREAST BIOPSY: SHX20

## 2011-02-11 MED ORDER — GADOBENATE DIMEGLUMINE 529 MG/ML IV SOLN
19.0000 mL | Freq: Once | INTRAVENOUS | Status: AC | PRN
  Administered 2011-02-11: 19 mL via INTRAVENOUS

## 2011-02-21 ENCOUNTER — Other Ambulatory Visit (INDEPENDENT_AMBULATORY_CARE_PROVIDER_SITE_OTHER): Payer: Self-pay | Admitting: General Surgery

## 2011-02-21 ENCOUNTER — Ambulatory Visit (INDEPENDENT_AMBULATORY_CARE_PROVIDER_SITE_OTHER): Payer: Medicare Other | Admitting: General Surgery

## 2011-02-21 ENCOUNTER — Encounter (INDEPENDENT_AMBULATORY_CARE_PROVIDER_SITE_OTHER): Payer: Self-pay | Admitting: General Surgery

## 2011-02-21 VITALS — BP 140/78 | HR 70 | Temp 98.2°F | Resp 18 | Ht 67.0 in | Wt 201.2 lb

## 2011-02-21 DIAGNOSIS — C50911 Malignant neoplasm of unspecified site of right female breast: Secondary | ICD-10-CM

## 2011-02-21 DIAGNOSIS — C50419 Malignant neoplasm of upper-outer quadrant of unspecified female breast: Secondary | ICD-10-CM

## 2011-02-21 DIAGNOSIS — Z170 Estrogen receptor positive status [ER+]: Secondary | ICD-10-CM

## 2011-02-21 DIAGNOSIS — C50411 Malignant neoplasm of upper-outer quadrant of right female breast: Secondary | ICD-10-CM | POA: Insufficient documentation

## 2011-02-21 HISTORY — DX: Estrogen receptor positive status (ER+): Z17.0

## 2011-02-21 NOTE — Progress Notes (Signed)
Patient ID: Michelle [name], female   DOB: 12/01/1932, 78 y.o.   MRN: [id number]  Chief Complaint  Patient presents with  . Other    Eval breast cancer    HPI Michelle [name] is a 78 y.o. female.  Referred by Dr. Gordan Payment HPI This is a 47 her old female whose family I know well. She is a significant family history of breast cancer  I know her husband from a colectomy and one daughter from treatment for breast cancer. She presents after undergoing screening MRI that showed a 1.4 x 1 x 0.6 mass in the anterior aspect of the upper outer quadrant of the right breast. There were no additional masses or areas enhancement  suspicious for malignancy in either breast. There are no abnormal appearing lymph nodes. She then underwent second look ultrasound which did not show this area. MRI guided vacuum assisted biopsy with clip placement was then performed. The cylinder shaped clip is in the anterior aspect of a post biopsy hematoma. Her pathology returns as invasive ductal carcinoma with ductal carcinoma in situ. This appears to be grade 1. Her estrogen receptor was positive at 100%. Her progesterone receptors positive at 100%. Her proliferation markers 10%. HER-2/neu is not amplified. She comes in today complaining of only a large hematoma in her right breast but is otherwise without complaint. She is present with her husband and 3 daughters today. She has no real history of prior breast complaints.  Past Medical History  Diagnosis Date  . GERD (gastroesophageal reflux disease)   . Hyperlipidemia   . Shingles 2005  . Hypertension   . History of cerebrovascular accident   . Stroke 09/2007  . Bell's palsy 12/84 (approx)  . Hiatal hernia 2008  . Cancer 10/28/2009    squamous cell - upper right arm  . Cancer 10/28/09    basal cell -upper right back  . Fatigue   . Wears glasses   . Voice hoarseness   . Change in vision   . Coughing   . Heartburn   . Bruises easily     Past Surgical History  Procedure  Date  . Cholecystectomy 2004    History reviewed. No pertinent family history.  Social History History  Substance Use Topics  . Smoking status: Former Smoker    Quit date: 02/01/1996  . Smokeless tobacco: Never Used  . Alcohol Use: Yes     rare    Allergies  Allergen Reactions  . Benadryl (Altaryl)   . Doxycycline Other (See Comments)    acid reflux  . Simvastatin Other (See Comments)    REACTION:     Current Outpatient Prescriptions  Medication Sig Dispense Refill  . Ascorbic Acid (VITAMIN C) 1000 MG tablet Take 1,000 mg by mouth daily. Take 2 daily       . aspirin 325 MG tablet Take 325 mg by mouth at bedtime.        . clopidogrel (PLAVIX) 75 MG tablet TAKE 1 TABLET BY MOUTH EVERY DAY  30 tablet  4  . fluticasone (FLONASE) 50 MCG/ACT nasal spray 2 sprays by Nasal route daily.  16 g  11  . lansoprazole (PREVACID) 30 MG capsule Take 30 mg by mouth daily.        Marland Kitchen lisinopril (PRINIVIL,ZESTRIL) 10 MG tablet Take 1 tablet (10 mg total) by mouth daily.  90 tablet  3  . Multiple Vitamins-Minerals (PRESERVISION AREDS 2) CAPS Take 1 capsule by mouth daily.        Marland Kitchen  rosuvastatin (CRESTOR) 10 MG tablet Take 10 mg by mouth daily.        Marland Kitchen zoster vaccine live, PF, (ZOSTAVAX) 45409 UNT/0.65ML injection Inject 19,400 Units into the skin once.  1 vial  0    Review of Systems Review of Systems  Constitutional: Negative for fever, chills and unexpected weight change.  HENT: Negative for hearing loss, congestion, sore throat, trouble swallowing and voice change.   Eyes: Negative for visual disturbance.  Respiratory: Negative for cough and wheezing.   Cardiovascular: Negative for chest pain, palpitations and leg swelling.  Gastrointestinal: Negative for nausea, vomiting, abdominal pain, diarrhea, constipation, blood in stool, abdominal distention and anal bleeding.  Genitourinary: Negative for hematuria, vaginal bleeding and difficulty urinating.  Musculoskeletal: Negative for  arthralgias.  Skin: Negative for rash and wound.  Neurological: Negative for seizures, syncope and headaches.  Hematological: Negative for adenopathy. Does not bruise/bleed easily.  Psychiatric/Behavioral: Negative for confusion.    Blood pressure 140/78, pulse 70, temperature 98.2 F (36.8 C), temperature source Temporal, resp. rate 18, height 5\' 7"  (1.702 m), weight 201 lb 3.2 oz (91.264 kg).  Physical Exam Physical Exam  Vitals reviewed. Constitutional: She appears well-developed and well-nourished.  Eyes: No scleral icterus.  Neck: Neck supple.  Cardiovascular: Normal rate, regular rhythm and normal heart sounds.   Pulmonary/Chest: Effort normal and breath sounds normal. She has no wheezes. She has no rales. Right breast exhibits skin change (large hematoma encompassing good portion of right breast) and tenderness (associated with hematoma). Right breast exhibits no inverted nipple, no mass and no nipple discharge. Left breast exhibits no inverted nipple, no mass, no nipple discharge, no skin change and no tenderness. Breasts are symmetrical.  Lymphadenopathy:    She has no cervical adenopathy.    She has no axillary adenopathy.       Right: No supraclavicular adenopathy present.       Left: No supraclavicular adenopathy present.    Data Reviewed  BILATERAL BREAST MRI WITH AND WITHOUT CONTRAST  Technique: Multiplanar, multisequence MR images of both breasts  were obtained prior to and following the intravenous administration  of 19ml of MultiHance. Three dimensional images were evaluated at  the independent DynaCad workstation.  Comparison: Previous examinations at [facility],  including the screening mammogram dated 09/15/2010.  Findings: Mild background parenchymal enhancement in both breasts,  greater on the left.  1.4 x 1.0 x 0.6 cm mass with a low grade enhancement in the  anterior aspect of the upper outer quadrant of the right breast.  The enhancement  kinetics are persistent. In the axial plane, this  is oval with mildly ill-defined margins. In the sagittal plane,  this has more irregular margins. This mass measures 1.4 x 1.0 x  0.6 cm in maximum dimensions. This is in an area of scattered  fibroglandular tissue on the patient's mammograms with no definite  mammographic correlate.  No additional masses or areas of enhancement suspicious for  malignancy in either breast. No abnormal appearing lymph nodes.  IMPRESSION:  1.4 x 1.0 x 0.6 cm mass in the anterior aspect of the upper outer  quadrant of the right breast. This is indeterminate for the  possibility of malignancy. A targeted right breast ultrasound is  recommended for biopsy purposes. If this cannot be visualized  sonographically, MR guided core needle biopsy would be recommended.  This has been discussed with Dr. Margy Clarks.  Assessment    Clinical stage I right breast cancer    Plan  Right breast wire guided lumpectomy and right ax snbx  We discussed the staging and pathophysiology of breast cancer. We discussed all of the different options for treatment for breast cancer including surgery, chemotherapy, radiation therapy, Herceptin, and antiestrogen therapy.   We discussed a sentinel lymph node biopsy as she does not appear to having lymph node involvement right now. We discussed the performance of that with injection of radioactive tracer and blue dye. We discussed that she would have an incision underneath her axillary hairline. We discussed that there is a bout a 10-20% chance of having a positive node with a sentinel lymph node biopsy and we will await the permanent pathology to make any other first further decisions in terms of her treatment. One of these options might be to return to the operating room to perform an axillary lymph node dissection. We discussed about a 1-2% risk lifetime of chronic shoulder pain as well as lymphedema associated with a sentinel lymph node biopsy.  I do think reasonable given that she is healthy otherwise doing a sentinel node in this 78yof. We discussed the options for treatment of the breast cancer which included lumpectomy versus a mastectomy. We discussed the performance of the lumpectomy with a wire placement. We discussed a 10-15% chance of a positive margin requiring reexcision in the operating room. We also discussed that she may need radiation therapy or antiestrogen therapy or both if she undergoes lumpectomy. We discussed the mastectomy and the postoperative care for that as well. We discussed that there is no difference in her survival whether she undergoes lumpectomy with radiation therapy or antiestrogen therapy versus a mastectomy. There is a slight difference in the local recurrence rate being 3-5% with lumpectomy and about 1% with a mastectomy. I think it would be reasonable to proceed with a wire guided lumpectomy and a sentinel lymph node biopsy. I'm going to have her see medical oncology and radiation oncology before surgery. I told her that I would be ideal to give this 2 weeks to wait for some of this hematoma resolved prior to her surgery. Also discussed with Dr. Cato Mulligan how to manage her Plavix and her aspirin. I'm comfortable taking her off her Plavix and her aspirin 5 days before. I would leave her on 81 mg of aspirin through surgery and restarted her postoperatively. She asked about a MammoSite device being placed at the same time as surgery. There is a chance she might not even need radiation solid preferred not to do that. We discussed doing the surgery and then making further decisions on whether treatment she would need after surgery. We discussed the risks of operation including bleeding, infection, possible reoperation. She understands her further therapy will be based on what her stages at the time of her operation.         Bethania Schlotzhauer 02/21/2011, 1:09 PM

## 2011-02-22 ENCOUNTER — Other Ambulatory Visit (INDEPENDENT_AMBULATORY_CARE_PROVIDER_SITE_OTHER): Payer: Self-pay | Admitting: General Surgery

## 2011-02-22 ENCOUNTER — Telehealth (INDEPENDENT_AMBULATORY_CARE_PROVIDER_SITE_OTHER): Payer: Self-pay

## 2011-02-22 DIAGNOSIS — C50419 Malignant neoplasm of upper-outer quadrant of unspecified female breast: Secondary | ICD-10-CM

## 2011-02-22 NOTE — Telephone Encounter (Signed)
Called pt notify her that Dr Dwain Sarna spoke to Dr Cato Mulligan about her stopping the Plavix and Aspirin 325mg  5 days before surgery. The pt will need to go on a 81mg  Aspirin the 5 days before her surgery per Dr Dwain Sarna. The pt will get notified when to restart the Plavix and Aspirin 325mg .

## 2011-02-23 ENCOUNTER — Telehealth: Payer: Self-pay | Admitting: *Deleted

## 2011-02-23 NOTE — Telephone Encounter (Signed)
Confirmed 02/28/11 appt w/ pt.  Mailed before letter to pt.  [name] stated that they already filled out Intake form w/ Dr. Doreen Salvage office. Should be scanned in.  Emailed Clydie Braun for [employer].

## 2011-02-28 ENCOUNTER — Other Ambulatory Visit: Payer: Self-pay | Admitting: *Deleted

## 2011-02-28 ENCOUNTER — Ambulatory Visit (HOSPITAL_BASED_OUTPATIENT_CLINIC_OR_DEPARTMENT_OTHER): Payer: Medicare Other | Admitting: Oncology

## 2011-02-28 ENCOUNTER — Encounter: Payer: Self-pay | Admitting: Oncology

## 2011-02-28 ENCOUNTER — Encounter: Payer: Self-pay | Admitting: Genetic Counselor

## 2011-02-28 ENCOUNTER — Ambulatory Visit: Payer: Medicare Other

## 2011-02-28 ENCOUNTER — Other Ambulatory Visit (HOSPITAL_BASED_OUTPATIENT_CLINIC_OR_DEPARTMENT_OTHER): Payer: Medicare Other

## 2011-02-28 VITALS — BP 113/74 | HR 59 | Temp 97.8°F | Ht 67.0 in | Wt 204.7 lb

## 2011-02-28 DIAGNOSIS — C50419 Malignant neoplasm of upper-outer quadrant of unspecified female breast: Secondary | ICD-10-CM

## 2011-02-28 DIAGNOSIS — C50919 Malignant neoplasm of unspecified site of unspecified female breast: Secondary | ICD-10-CM

## 2011-02-28 LAB — CBC WITH DIFFERENTIAL/PLATELET
Basophils Absolute: 0.1 10*3/uL (ref 0.0–0.1)
EOS%: 0 % (ref 0.0–7.0)
HCT: 36 % (ref 34.8–46.6)
HGB: 12.3 g/dL (ref 11.6–15.9)
LYMPH%: 21.6 % (ref 14.0–49.7)
MCH: 33.3 pg (ref 25.1–34.0)
MCV: 97.4 fL (ref 79.5–101.0)
MONO%: 8.4 % (ref 0.0–14.0)
NEUT%: 68.4 % (ref 38.4–76.8)
Platelets: 253 10*3/uL (ref 145–400)
RDW: 12.3 % (ref 11.2–14.5)

## 2011-02-28 LAB — COMPREHENSIVE METABOLIC PANEL
AST: 17 U/L (ref 0–37)
Alkaline Phosphatase: 94 U/L (ref 39–117)
BUN: 11 mg/dL (ref 6–23)
Creatinine, Ser: 0.87 mg/dL (ref 0.50–1.10)
Glucose, Bld: 90 mg/dL (ref 70–99)
Total Bilirubin: 0.3 mg/dL (ref 0.3–1.2)

## 2011-02-28 NOTE — Patient Instructions (Signed)
Anastrozole tablets What is this medicine? ANASTROZOLE (an AS troe zole) is used to treat breast cancer in women who have gone through menopause. Some types of breast cancer depend on estrogen to grow, and this medicine can stop tumor growth by blocking estrogen production. This medicine may be used for other purposes; ask your health care provider or pharmacist if you have questions. What should I tell my health care provider before I take this medicine? They need to know if you have any of these conditions: -liver disease -an unusual or allergic reaction to anastrozole, other medicines, foods, dyes, or preservatives -pregnant or trying to get pregnant -breast-feeding How should I use this medicine? Take this medicine by mouth with a glass of water. Follow the directions on the prescription label. You can take this medicine with or without food. Take your doses at regular intervals. Do not take your medicine more often than directed. Do not stop taking except on the advice of your doctor or health care professional. Talk to your pediatrician regarding the use of this medicine in children. Special care may be needed. Overdosage: If you think you have taken too much of this medicine contact a poison control center or emergency room at once. NOTE: This medicine is only for you. Do not share this medicine with others. What if I miss a dose? If you miss a dose, take it as soon as you can. If it is almost time for your next dose, take only that dose. Do not take double or extra doses. What may interact with this medicine? Do not take this medicine with any of the following medications: -female hormones, like estrogens or progestins and birth control pills This medicine may also interact with the following medications: -tamoxifen This list may not describe all possible interactions. Give your health care provider a list of all the medicines, herbs, non-prescription drugs, or dietary supplements you  use. Also tell them if you smoke, drink alcohol, or use illegal drugs. Some items may interact with your medicine. What should I watch for while using this medicine? Visit your doctor or health care professional for regular checks on your progress. Let your doctor or health care professional know about any unusual vaginal bleeding. Do not treat yourself for diarrhea, nausea, vomiting or other side effects. Ask your doctor or health care professional for advice. What side effects may I notice from receiving this medicine? Side effects that you should report to your doctor or health care professional as soon as possible: -allergic reactions like skin rash, itching or hives, swelling of the face, lips, or tongue -any new or unusual symptoms -breathing problems -chest pain -leg pain or swelling -vomiting Side effects that usually do not require medical attention (report to your doctor or health care professional if they continue or are bothersome): -back or bone pain -cough, or throat infection -diarrhea or constipation -dizziness -headache -hot flashes -loss of appetite -nausea -sweating -weakness and tiredness -weight gain This list may not describe all possible side effects. Call your doctor for medical advice about side effects. You may report side effects to FDA at 1-800-FDA-1088. Where should I keep my medicine? Keep out of the reach of children. Store at room temperature between 20 and 25 degrees C (68 and 77 degrees F). Throw away any unused medicine after the expiration date. NOTE: This sheet is a summary. It may not cover all possible information. If you have questions about this medicine, talk to your doctor, pharmacist, or health care provider.    2012, Elsevier/Gold Standard. (03/30/2007 4:31:52 PM)  Exemestane tablets What is this medicine? EXEMESTANE (ex e MES tane) blocks the production of the hormone estrogen. Some types of breast cancer depend on estrogen to grow, and  this medicine can stop tumor growth by blocking estrogen production. This medicine is for the treatment of breast cancer in postmenopausal women only. This medicine may be used for other purposes; ask your health care provider or pharmacist if you have questions. What should I tell my health care provider before I take this medicine? They need to know if you have any of these conditions: -an unusual or allergic reaction to exemestane, other medicines, foods, dyes, or preservatives -pregnant or trying to get pregnant -breast-feeding How should I use this medicine? Take this medicine by mouth with a glass of water. Follow the directions on the prescription label. Take your doses at regular intervals after a meal. Do not take your medicine more often than directed. Do not stop taking except on the advice of your doctor or health care professional. Contact your pediatrician regarding the use of this medicine in children. Special care may be needed. Overdosage: If you think you have taken too much of this medicine contact a poison control center or emergency room at once. NOTE: This medicine is only for you. Do not share this medicine with others. What if I miss a dose? If you miss a dose, take the next dose as usual. Do not try to make up the missed dose. Do not take double or extra doses. What may interact with this medicine? Do not take this medicine with any of the following medications: -female hormones, like estrogens and birth control pills This medicine may also interact with the following medications: -androstenedione -phenytoin -rifabutin, rifampin, or rifapentine -St. John's Wort This list may not describe all possible interactions. Give your health care provider a list of all the medicines, herbs, non-prescription drugs, or dietary supplements you use. Also tell them if you smoke, drink alcohol, or use illegal drugs. Some items may interact with your medicine. What should I watch for  while using this medicine? Visit your doctor or health care professional for regular checks on your progress. If you experience hot flashes or sweating while taking this medicine, avoid alcohol, smoking and drinks with caffeine. This may help to decrease these side effects. What side effects may I notice from receiving this medicine? Side effects that you should report to your doctor or health care professional as soon as possible: -any new or unusual symptoms -changes in vision -fever -leg or arm swelling -pain in bones, joints, or muscles -pain in hips, back, ribs, arms, shoulders, or legs Side effects that usually do not require medical attention (report to your doctor or health care professional if they continue or are bothersome): -difficulty sleeping -headache -hot flashes -sweating -unusually weak or tired This list may not describe all possible side effects. Call your doctor for medical advice about side effects. You may report side effects to FDA at 1-800-FDA-1088. Where should I keep my medicine? Keep out of the reach of children. Store at room temperature between 15 and 30 degrees C (59 and 86 degrees F). Throw away any unused medicine after the expiration date. NOTE: This sheet is a summary. It may not cover all possible information. If you have questions about this medicine, talk to your doctor, pharmacist, or health care provider.  2012, Elsevier/Gold Standard. (05/22/2007 11:48:29 AM)  Letrozole tablets What is this medicine? LETROZOLE (LET roe  zole) blocks the production of estrogen. Certain types of breast cancer grow under the influence of estrogen. Letrozole helps block tumor growth. This medicine is used to treat advanced breast cancer in postmenopausal women. This medicine may be used for other purposes; ask your health care provider or pharmacist if you have questions. What should I tell my health care provider before I take this medicine? They need to know if you  have any of these conditions: -liver disease -osteoporosis (weak bones) -an unusual or allergic reaction to letrozole, other medicines, foods, dyes, or preservatives -pregnant or trying to get pregnant -breast-feeding How should I use this medicine? Take this medicine by mouth with a glass of water. You may take it with or without food. Follow the directions on the prescription label. Take your medicine at regular intervals. Do not take your medicine more often than directed. Do not stop taking except on your doctor's advice. Talk to your pediatrician regarding the use of this medicine in children. Special care may be needed. Overdosage: If you think you have taken too much of this medicine contact a poison control center or emergency room at once. NOTE: This medicine is only for you. Do not share this medicine with others. What if I miss a dose? If you miss a dose, take it as soon as you can. If it is almost time for your next dose, take only that dose. Do not take double or extra doses. What may interact with this medicine? Do not take this medicine with any of the following medications: -estrogens, like hormone replacement therapy or birth control pills This medicine may also interact with the following medications: -dietary supplements such as androstenedione or DHEA -prasterone -tamoxifen This list may not describe all possible interactions. Give your health care provider a list of all the medicines, herbs, non-prescription drugs, or dietary supplements you use. Also tell them if you smoke, drink alcohol, or use illegal drugs. Some items may interact with your medicine. What should I watch for while using this medicine? Visit your doctor or health care professional for regular check-ups to monitor your condition. Do not use this drug if you are pregnant. Serious side effects to an unborn child are possible. Talk to your doctor or pharmacist for more information. You may get drowsy or  dizzy. Do not drive, use machinery, or do anything that needs mental alertness until you know how this medicine affects you. Do not stand or sit up quickly, especially if you are an older patient. This reduces the risk of dizzy or fainting spells. What side effects may I notice from receiving this medicine? Side effects that you should report to your doctor or health care professional as soon as possible: -allergic reactions like skin rash, itching, or hives -bone fracture -chest pain -difficulty breathing or shortness of breath -severe pain, swelling, warmth in the leg -unusually weak or tired -vaginal bleeding Side effects that usually do not require medical attention (report to your doctor or health care professional if they continue or are bothersome): -bone, back, joint, or muscle pain -dizziness -fatigue -fluid retention -headache -hot flashes, night sweats -nausea -weight gain This list may not describe all possible side effects. Call your doctor for medical advice about side effects. You may report side effects to FDA at 1-800-FDA-1088. Where should I keep my medicine? Keep out of the reach of children. Store between 15 and 30 degrees C (59 and 86 degrees F). Throw away any unused medicine after the expiration date. NOTE:  This sheet is a summary. It may not cover all possible information. If you have questions about this medicine, talk to your doctor, pharmacist, or health care provider.  2012, Elsevier/Gold Standard. (03/30/2007 4:43:44 PM)

## 2011-02-28 NOTE — Progress Notes (Unsigned)
BRCA1/BRCA2 sent to [facility]. TAT ~2 weeks. 

## 2011-02-28 NOTE — Progress Notes (Signed)
DEA BITTING [id number] 06-30-1932 78 y.o. 02/28/2011 5:43 PM  CC  Judie Petit, MD, MD [address] [place] [address] Dr. Emelia Loron Dr. Lurline Hare  REASON FOR CONSULTATION:  78 year old female with stage 1A invasive ductal carcinoma of the right breast seen in Medical oncology today for discussion of treatment options. She does have an appointment set up to be seen by Dr. Lurline Hare as well on 03/02/2011.  STAGE:  Stage I (T1NxMx)  REFERRING PHYSICIAN: Dr. Emelia Loron  HISTORY OF PRESENT ILLNESS:  Michelle [name] is a 78 y.o. female.  With medical history significant for gastroesophageal reflux disease hyperlipidemia hypertension previous history of strokes Bell's palsy. Patient began having screening mammograms at the age of 32. Most recently she presented for a screening MRI that revealed A 1.4 x 1 x 0.6 cm mass in the anterior aspect of the upper outer quadrant of the right breast. She had a second look ultrasound that did not show this area. She does went on to have MRI guided vacuum assisted biopsy with clip placement performed on 02/11/2011. The core needle biopsy of this upper-outer quadrant mass showed invasive ductal carcinoma with ductal carcinoma in situ and. Neural in view in vision. The carcinoma appeared to be grade 1. The breast prognostic profile showed the tumor to be 100% positive for estrogen receptor 100% positive for progesterone receptor proliferation marker 10% HER-2/neu negative with a ratio of 1.46. She was seen by Dr. Emelia Loron on 02/21/2011. She is now seen in medical oncology preop. She is without any complaints. Today she is accompanied by her 2 daughters who are breast cancer survivors her husband as well as a friend who is an Charity fundraiser   Past Medical History: Past Medical History  Diagnosis Date  . GERD (gastroesophageal reflux disease)   . Hyperlipidemia   . Shingles 2005  . Hypertension   . History of  cerebrovascular accident   . Stroke 09/2007  . Bell's palsy 12/84 (approx)  . Hiatal hernia 2008  . Cancer 10/28/2009    squamous cell - upper right arm  . Cancer 10/28/09    basal cell -upper right back  . Fatigue   . Wears glasses   . Voice hoarseness   . Change in vision   . Coughing   . Heartburn   . Bruises easily     Past Surgical History: Past Surgical History  Procedure Date  . Cholecystectomy 2004    Family History: Patient had a younger sister who had lymphoma at age 20. An older sister who in 2005 was diagnosed with renal cell carcinoma and then breast cancer. Mother and father without any significant malignancies. Patient has 1 daughter who is 86 who had bilateral breast cancers at the age of 25 and then 40. She was treated by one of my partners. She was tested for the BRCA1 and 2 gene mutation and it was negative. She has a second daughter Michelle [name] who is my patient she is also a breast cancer survivor. She was tested for the BRCA1 and 2 gene mutation as well and she is found to not be a carrier. There is a paternal aunt who had breast cancer. Social History  History  Substance Use Topics  . Smoking status: Former Smoker    Quit date: 02/01/1996  . Smokeless tobacco: Never Used  . Alcohol Use: Yes     rare    Allergies: Allergies  Allergen Reactions  . Benadryl (Altaryl)   .  Doxycycline Other (See Comments)    acid reflux  . Simvastatin Other (See Comments)    REACTION:     Current Medications: Current Outpatient Prescriptions  Medication Sig Dispense Refill  . Ascorbic Acid (VITAMIN C) 1000 MG tablet Take 1,000 mg by mouth daily. Take 2 daily       . aspirin 325 MG tablet Take 325 mg by mouth at bedtime.        . clopidogrel (PLAVIX) 75 MG tablet TAKE 1 TABLET BY MOUTH EVERY DAY  30 tablet  4  . fluticasone (FLONASE) 50 MCG/ACT nasal spray 2 sprays by Nasal route daily.  16 g  11  . lansoprazole (PREVACID) 30 MG capsule Take 30 mg by mouth daily.        Marland Kitchen  lisinopril (PRINIVIL,ZESTRIL) 10 MG tablet Take 1 tablet (10 mg total) by mouth daily.  90 tablet  3  . Multiple Vitamins-Minerals (PRESERVISION AREDS 2) CAPS Take 1 capsule by mouth daily.       . rosuvastatin (CRESTOR) 10 MG tablet Take 10 mg by mouth daily.        Marland Kitchen zoster vaccine live, PF, (ZOSTAVAX) 84696 UNT/0.65ML injection Inject 19,400 Units into the skin once.  1 vial  0    OB/GYN History:Patient had menarche at age 48 menopause at 47 she did use hormone replacement therapy for about one year.  Fertility Discussion:NA Prior History of Cancer:Patient has a history of basal cell and squamous cell carcinoma of the skin.  Health Maintenance:  Colonoscopy Last colonoscopy 3 years ago with removal of a polyp. Bone Density 5 years ago Last PAP smear 20 years ago  ECOG PERFORMANCE STATUS: 1 - Symptomatic but completely ambulatory  Genetic Counseling/testing: Patient was seen by one of our genetic counselors and blood was drawn for BRCA1 and 2 gene mutation  REVIEW OF SYSTEMS:  Constitutional: negative Eyes: negative Ears, nose, mouth, throat, and face: negative Respiratory: negative Cardiovascular: negative Gastrointestinal: negative Genitourinary:negative Integument/breast: positive for breast lump and breast tenderness Hematologic/lymphatic: negative Musculoskeletal:positive for arthralgias and myalgias Neurological: negative  PHYSICAL EXAMINATION: Blood pressure 113/74, pulse 59, temperature 97.8 F (36.6 C), temperature source Oral, height 5\' 7"  (1.702 m), weight 204 lb 11.2 oz (92.851 kg).  EXB:MWUXL, healthy, no distress, well nourished and well developed SKIN: skin color, texture, turgor are normal HEAD: Normocephalic, No masses, lesions, tenderness or abnormalities EYES: PERRLA, EOMI, Conjunctiva are pink and non-injected, sclera clear EARS: External ears normal OROPHARYNX:no exudate, no erythema and lips, buccal mucosa, and tongue normal  NECK: supple, no  adenopathy, no bruits, no JVD, thyroid normal size, non-tender, without nodularity LYMPH:  no palpable lymphadenopathy, no hepatosplenomegaly BREAST:Bilateral breast examination is performed right breast reveals a palpable hematoma with significant amount of area of ecchymosis. Left breast no masses nipple discharge inversion or retraction no skin changes LUNGS: clear to auscultation and percussion HEART: regular rate & rhythm, no murmurs and no gallops ABDOMEN:abdomen soft, non-tender, normal bowel sounds and no masses or organomegaly BACK: Back symmetric, no curvature., No CVA tenderness EXTREMITIES:no edema, no clubbing, no cyanosis  NEURO: alert & oriented x 3 with fluent speech, no focal motor/sensory deficits, gait normal, reflexes normal and symmetric    STUDIES/RESULTS: Mr Biopsy/wire Localization  02/14/2011    **ADDENDUM** CREATED: 02/14/2011 14:20:17  Final pathology results of an MRI guided right breast biopsy show invasive ductal carcinoma and DCIS.  Pathology results are concordant with imaging findings.  Sonnie Alamo, R.N., telephoned these pathology results with Dr. Yolanda Bonine at  [facility], who will discuss pathology results and arrange follow up for the patient.  Addended by:  Britta Mccreedy, M.D. on 02/14/2011 14:20:17.  **END ADDENDUM** SIGNED BY: Britta Mccreedy, M.D.    02/11/2011  *RADIOLOGY REPORT*  Clinical Data: 1.4 cm mass in the upper outer quadrant of the right breast on a recent screening MR of the breasts.  Strong family history of breast cancer.  MRI GUIDED VACUUM ASSISTED BIOPSY OF THE RIGHT BREAST WITHOUT AND WITH CONTRAST  Technique: Multiplanar, multisequence MR images of the right breast were obtained prior to and following the intravenous administration of 19 ml of Multihance.  I met with the patient, and we discussed the procedure of MRI guided biopsy, including the risks of bleeding, infection and clip migration.  We discussed the high likelihood of a  successful procedure.  Informed, written consent was given.  Using sterile technique, 2% Lidocaine, MRI guidance, and a 9 gauge vacuum assisted device, biopsy was performed of the recently demonstrate 1.4 cm mass in the upper outer quadrant of the right breast, anteriorly.  At the conclusion of the procedure, a tissue marker clip was deployed into the biopsy cavity.  Follow-up 2-view mammogram was performed and dictated separately.  IMPRESSION: MRI guided biopsy of a 1.4 cm upper outer quadrant right breast mass.  No apparent complications.  THREE-DIMENSIONAL MR IMAGE RENDERING ON INDEPENDENT WORKSTATION:  Three-dimensional MR images were rendered by post-processing of the original MR data on an independent workstation.  The three- dimensional MR images were interpreted, and findings were reported in the accompanying complete MRI report for this study.  Original Report Authenticated By: Britta Mccreedy, M.D.   Mm Digital Diagnostic Unilat R  02/11/2011  *RADIOLOGY REPORT*  Clinical Data:  Titanium marker placement following MR guided core needle biopsy earlier today.  DIGITAL DIAGNOSTIC RIGHT MAMMOGRAM  Comparison:  MR biopsy performed earlier today.  Findings:  Digital mammographic images were performed following MR guided biopsy of a 1.4 cm mass in the upper outer quadrant of the right breast, anteriorly.  These demonstrate a cylinder shaped clip in the anterior aspect of a postbiopsy hematoma superolaterally.  IMPRESSION: Biopsy marker clip deployment in the anterior aspect of a postbiopsy hematoma in the upper outer right breast anteriorly.  Original Report Authenticated By: Darrol Angel, M.D.     LABS:    Chemistry      Component Value Date/Time   NA 138 02/28/2011 1447   K 4.5 02/28/2011 1447   CL 103 02/28/2011 1447   CO2 29 02/28/2011 1447   BUN 11 02/28/2011 1447   CREATININE 0.87 02/28/2011 1447      Component Value Date/Time   CALCIUM 9.7 02/28/2011 1447   ALKPHOS 94 02/28/2011 1447   AST 17  02/28/2011 1447   ALT 9 02/28/2011 1447   BILITOT 0.3 02/28/2011 1447      Lab Results  Component Value Date   WBC 7.4 02/28/2011   HGB 12.3 02/28/2011   HCT 36.0 02/28/2011   MCV 97.4 02/28/2011   PLT 253 02/28/2011       PATHOLOGY: REPORT OF SURGICAL PATHOLOGY FINAL DIAGNOSIS Diagnosis Breast, right, needle core biopsy, mass, UOQ - INVASIVE DUCTAL CARCINOMA. - DUCTAL CARCINOMA IN SITU. - PERINEURAL INVASION IS IDENTIFIED. - SEE COMMENT. Microscopic Comment Although grade is best determined at time of surgical excision, the carcinoma appears grade I. A breast prognostic profile will be performed and the results reported separately. The results were called to [facility] on 02/14/2011. (JBK:gt, 02/14/11) Pecola Leisure MD Pathologist, Electronic Signature (Case signed 02/14/2011) PROGNOSTIC INDICATORS - ACIS Results IMMUNOHISTOCHEMICAL AND MORPHOMETRIC ANALYSIS BY THE AUTOMATED CELLULAR IMAGING SYSTEM (ACIS) Estrogen Receptor (Negative, <1%): 100%, STRONG STAINING INTENSITY Progesterone Receptor (Negative, <1%): 100%, STRONG STAINING INTENSITY Proliferation Marker Ki67 by M IB-1 (Low<20%): 10% All controls stained appropriately CHROMOGENIC IN-SITU HYBRIDIZATION Interpretation HER-2/NEU BY CISH - NO AMPLIFICATION OF HER-2 DETECTED. THE RATIO OF HER-2: CEP 17 SIGNALS WAS 1.46. 1 of 2 FINAL for DAYONA, SHAHEEN [phone]) (continued) Reference range: Ratio: HER2:CEP17 < 1.8 - gene amplification not observed Ratio: HER2:CEP 17 1.8-2.2 - equivocal result Ratio: HER2:CEP17 > 2.2 - gene amplification observed     ASSESSMENT 78 year old female with  #11.4 cm grade 1 invasive ductal carcinoma of the right breast she is status post needle core biopsy. The pathology showed invasive ductal carcinoma that was low grade ER +100% PR +100% proliferation marker 10% HER-2/neu was not amplified.  #2 patient is seen in medical oncology for consideration of treatment  options. She has RE been seen by Dr. Emelia Loron that she will also be seeing Dr. Lurline Hare. #3 patient has a history of previous strokes and CVA.  #4 hypertension hyperlipidemia history of Bell's palsy   #5 patient has a significant family history for breast cancers especially in HER-2 young daughters who ALT both had breast cancers under the age of 54.   PLAN:    #1 the patient and I discussed her pathology at great length we also discussed her MRI results. NCCN guidelines recommendation is a lumpectomy with sentinel node biopsy. Systemic treatment would consist of per guidelines an antiestrogen since patient is ER positive PR positive. The agents we could utilize in this postmenopausal patient would be an aromatase inhibitor such as Arimidex letrozole or exemestane. Risks and benefits of these drugs were discussed very clearly with the patient and her family   #2 since patient is very much interested in breast conservation she will be seen by Dr. Lurline Hare to discuss the role of radiation therapy post lumpectomy in a 78 year old. Patient is very much interested in possibility of doing MammoSite for partial breast irradiation  #3 due to patient's significant family history of breast cancer especially in her daughters I have recommended that patient be seen by genetic counseling and this was done during her visit today.  #4 we had a lengthy 60 minute discussion today regarding the rationale for all of the treatments recommended. Patient is currently scheduled to have her lumpectomy procedure performed on February 7.  #5 I will plan on seeing the patient back on February 18 or 19 of the 2013 .     Thank you so much for allowing me to participate in the care of Michelle [name]. I will continue to follow up the patient with you and assist in her care.  All questions were answered. The patient knows to call the clinic with any problems, questions or concerns. We can certainly see  the patient much sooner if necessary.  I spent 40 minutes counseling the patient face to face. The total time spent in the appointment was 60 minutes.  Drue Second, MD Medical/Oncology [facility] [phone] (beeper) [phone] (Office)  02/28/2011, 5:43 PM 02/28/2011, 5:43 PM

## 2011-03-01 ENCOUNTER — Encounter: Payer: Self-pay | Admitting: *Deleted

## 2011-03-01 ENCOUNTER — Ambulatory Visit: Payer: Medicare Other

## 2011-03-01 ENCOUNTER — Ambulatory Visit: Payer: Medicare Other | Admitting: Radiation Oncology

## 2011-03-02 ENCOUNTER — Ambulatory Visit
Admission: RE | Admit: 2011-03-02 | Discharge: 2011-03-02 | Disposition: A | Discharge status: Home / Self Care | Payer: Medicare Other | Source: Ambulatory Visit | Attending: Radiation Oncology | Admitting: Radiation Oncology

## 2011-03-02 ENCOUNTER — Encounter: Payer: Self-pay | Admitting: Radiation Oncology

## 2011-03-02 ENCOUNTER — Encounter: Payer: Self-pay | Admitting: *Deleted

## 2011-03-02 DIAGNOSIS — C50419 Malignant neoplasm of upper-outer quadrant of unspecified female breast: Secondary | ICD-10-CM

## 2011-03-02 DIAGNOSIS — Z510 Encounter for antineoplastic radiation therapy: Secondary | ICD-10-CM | POA: Insufficient documentation

## 2011-03-02 DIAGNOSIS — C50919 Malignant neoplasm of unspecified site of unspecified female breast: Secondary | ICD-10-CM | POA: Insufficient documentation

## 2011-03-02 NOTE — Progress Notes (Signed)
Menses age 78 1st pregnancy age 78- 2 daughters Vaginal births No HRT

## 2011-03-02 NOTE — Progress Notes (Signed)
Please see the Nurse Progress Note in the MD Initial Consult Encounter for this patient. 

## 2011-03-02 NOTE — Progress Notes (Signed)
Mailed after appt letter to pt. 

## 2011-03-04 ENCOUNTER — Encounter (HOSPITAL_BASED_OUTPATIENT_CLINIC_OR_DEPARTMENT_OTHER): Payer: Self-pay | Admitting: *Deleted

## 2011-03-04 DIAGNOSIS — C50419 Malignant neoplasm of upper-outer quadrant of unspecified female breast: Secondary | ICD-10-CM | POA: Diagnosis present

## 2011-03-04 NOTE — Progress Notes (Signed)
To come in for labs,cxr,ekg-said the nl will be a [facility] now-not [facility]

## 2011-03-08 ENCOUNTER — Encounter (HOSPITAL_BASED_OUTPATIENT_CLINIC_OR_DEPARTMENT_OTHER)
Admission: RE | Admit: 2011-03-08 | Discharge: 2011-03-08 | Disposition: A | Discharge status: Home / Self Care | Payer: Medicare Other | Source: Ambulatory Visit | Attending: General Surgery | Admitting: General Surgery

## 2011-03-08 ENCOUNTER — Encounter (INDEPENDENT_AMBULATORY_CARE_PROVIDER_SITE_OTHER): Payer: Self-pay | Admitting: General Surgery

## 2011-03-08 ENCOUNTER — Other Ambulatory Visit: Payer: Self-pay

## 2011-03-08 ENCOUNTER — Ambulatory Visit
Admission: RE | Admit: 2011-03-08 | Discharge: 2011-03-08 | Disposition: A | Discharge status: Home / Self Care | Payer: Medicare Other | Source: Ambulatory Visit | Attending: General Surgery | Admitting: General Surgery

## 2011-03-08 ENCOUNTER — Ambulatory Visit (INDEPENDENT_AMBULATORY_CARE_PROVIDER_SITE_OTHER): Payer: Medicare Other | Admitting: General Surgery

## 2011-03-08 VITALS — BP 128/78 | HR 74 | Resp 16 | Ht 67.0 in | Wt 202.0 lb

## 2011-03-08 DIAGNOSIS — C50919 Malignant neoplasm of unspecified site of unspecified female breast: Secondary | ICD-10-CM

## 2011-03-08 LAB — COMPREHENSIVE METABOLIC PANEL
AST: 20 U/L (ref 0–37)
Albumin: 3.9 g/dL (ref 3.5–5.2)
Chloride: 103 mEq/L (ref 96–112)
Creatinine, Ser: 0.82 mg/dL (ref 0.50–1.10)
Potassium: 4.8 mEq/L (ref 3.5–5.1)
Total Bilirubin: 0.3 mg/dL (ref 0.3–1.2)
Total Protein: 7.5 g/dL (ref 6.0–8.3)

## 2011-03-08 LAB — CBC
MCV: 97 fL (ref 78.0–100.0)
Platelets: 261 10*3/uL (ref 150–400)
RDW: 12.4 % (ref 11.5–15.5)
WBC: 7.8 10*3/uL (ref 4.0–10.5)

## 2011-03-08 NOTE — Progress Notes (Signed)
CC:   Michelle Gosling, MD Valetta Mole. Swords, MD Drue Second, M.D.  DIAGNOSIS:  T1 N0 invasive ductal carcinoma of the right breast.  PREVIOUS INTERVENTIONS:  Right needle core biopsy on 02/11/2011 revealing grade 1 invasive ductal carcinoma with perineural invasion Michelle associated DCIS, ER/PR 100%.  Ki-67 10%.  HER2 negative.  HISTORY OF PRESENT ILLNESS:  Michelle [name] is a 77 year old female who presented with a screening MRI on 12/30/2010 secondary to a strong [name] history.  This showed a 1.4 x 1.0 x 0.6 cm mass in the upper outer quadrant of the right breast.  No other abnormalities were noted in the left or right breast.  The lymph nodes were all normal appearing. An MRI guided biopsy on 02/11/2011 revealed an invasive ductal carcinoma.  Michelle [name] was then referred to Dr. Dwain Sarna as well as Dr. Welton Flakes for consideration of treatment options.  Michelle [name] had a hematoma after a biopsy but otherwise has no breast related complaints.  Michelle [name] had no pain or palpable masses prior to this.  Michelle [name] is scheduled for surgery on February 7.  PAST MEDICAL HISTORY:  Gastroesophageal reflux disease, hyperlipidemia, shingles, hypertension, history of a stroke, Bell's palsy, hiatal hernia, status post removal of squamous cell Michelle basal cell carcinoma, status post cholecystectomy.  [name] HISTORY:  Includes a daughter who had MammoSite for breast cancer at the age of 67.  Another daughter had a mastectomy at 28 Michelle a sister with breast cancer at 44.  SOCIAL HISTORY:  Michelle [name] is a former smoker.  Michelle [name] quit smoking in 1998.  Michelle [name] denies any alcohol use.  ALLERGIES:  Include Benadryl, doxycycline Michelle simvastatin.  MEDICATIONS:  Include vitamin C, aspirin, Plavix, Flonase, Prevacid, lisinopril, multivitamin Michelle Crestor.  REVIEW OF SYSTEMS:  Positive for fatigue, wearing glasses, hoarse voice, occasional blurry vision, coughing, heartburn Michelle easy bruising.  All other systems reviewed Michelle found to be  negative.  PHYSICAL EXAMINATION:  Michelle [name] is a pleasant female in no distress sitting comfortably on exam room table.  Michelle [name] weight is 203 pounds.  Blood pressure 100/69, pulse 60, temperature 98.1.  Michelle [name] has a palpable seroma over the upper outer quadrant of the right breast.  Michelle [name] has large pendulous breasts bilaterally.  No palpable cervical, supraclavicular, axillary adenopathy bilaterally.  No abnormalities of the left breast.  IMPRESSION:  T1 N0 invasive ductal carcinoma of the right breast.  RECOMMENDATIONS:  I spoke to Michelle [name] Michelle Michelle [name] [name] for an hour today. Greater than 50% of that time was spent in counseling Michelle coordination of care.  We discussed the role of radiation Michelle decreasing local failures after lumpectomy.  We discussed the process of simulation Michelle the placement of tattoos.  We discussed 30-33 treatments as an outpatient.  Given Michelle [name] Michelle large breast size I do not feel Michelle [name] would be a candidate for the hypofractionation regimen.  We discussed MammoSite Michelle the process of that as well as the possible side effects including permanent palpable hard seroma cavities.  We discussed no treatment Michelle that the likelihood of recurrence is somewhere only in the 30%-40% range at 10-15 years with no treatment after Michelle [name] surgery. Dr. Dwain Sarna is leaning towards just doing Michelle [name] surgery, removing this with a large margin Michelle either no further treatment or antiestrogen alone.  We discussed the possible side effects of radiation including but not limited to skin redness Michelle fatigue.  We discussed permanent lung damage.  Ultimately, I think the decision is going to be difficult for  Michelle [name] as Michelle [name] does understand protecting herself from local recurrence but also understands the complications of different types of treatment. They were going to discuss this as a [name] Michelle give Dr. Doreen Salvage office a call.  I thought it would be reasonable to treat Michelle [name] with MammoSite, reasonable to  treat Michelle [name] with hormones alone or reasonable to treat Michelle [name] with fractionated external beam for 25 treatments with no boost as long as Michelle [name] margins are negative.  I will await to hear from Dr. Doreen Salvage office as soon as Michelle [name] surgical plan is complete.    ______________________________ Lurline Hare, M.D. SW/MEDQ  D:  03/04/2011  T:  03/07/2011  Job:  14

## 2011-03-08 NOTE — Progress Notes (Signed)
Subjective:     Patient ID: Michelle [name], female   DOB: 02/04/1932, 78 y.o.   MRN: [id number]  HPI 78 yof with newly diagnosed right breast cancer.  We discussed all her options at last visit and she has now seen med onc and rad onc.  She returns today and we discussed with her family for about 40 minutes options including lumpectomy/sn then waiting for path, lump/sn with antiestrogen therapy, lump/sn with mammosite balloon placement or lump/sn with standard xrt.  She has no complaints since last visit  Review of Systems     Objective:   Physical Exam Deferred today as this was conversation about surgical plan    Assessment:     Clinical stage I right breast cancer    Plan:     We discussed all options.  She would like to proceed with lumpectomy, snbx and CED placement at time of surgery.  We discussed Mammosite in detail today.  We specifically discussed criteria and need to review final path before deciding to place brachytherapy catheter.  She understands surgery will be done Monday followed by change from CED to multi-lumen cathter on Wednesday followed by therapy which would be completed the next week.  If she is not a candidate for Mammosite then I will just remove the balloon on Monday after final path reviewed and she will need to proceed with antiestrogen and/or standard radiation therapy.  We discussed outcomes associated with each and she is very hesitant to take antiestrogen therapy.  We discussed change of catheter, long term seroma at site of device and wound complications.  She is comfortable with this plan.      

## 2011-03-09 ENCOUNTER — Telehealth: Payer: Self-pay | Admitting: Genetic Counselor

## 2011-03-10 ENCOUNTER — Other Ambulatory Visit (HOSPITAL_COMMUNITY): Payer: Self-pay

## 2011-03-14 ENCOUNTER — Ambulatory Visit (HOSPITAL_BASED_OUTPATIENT_CLINIC_OR_DEPARTMENT_OTHER): Payer: Medicare Other | Admitting: Anesthesiology

## 2011-03-14 ENCOUNTER — Encounter (HOSPITAL_BASED_OUTPATIENT_CLINIC_OR_DEPARTMENT_OTHER): Payer: Self-pay | Admitting: Anesthesiology

## 2011-03-14 ENCOUNTER — Encounter (HOSPITAL_BASED_OUTPATIENT_CLINIC_OR_DEPARTMENT_OTHER): Payer: Self-pay | Admitting: *Deleted

## 2011-03-14 ENCOUNTER — Other Ambulatory Visit (INDEPENDENT_AMBULATORY_CARE_PROVIDER_SITE_OTHER): Payer: Self-pay | Admitting: General Surgery

## 2011-03-14 ENCOUNTER — Encounter (HOSPITAL_BASED_OUTPATIENT_CLINIC_OR_DEPARTMENT_OTHER)
Admission: RE | Disposition: A | Discharge status: Home / Self Care | Payer: Self-pay | Source: Ambulatory Visit | Attending: General Surgery

## 2011-03-14 ENCOUNTER — Ambulatory Visit (HOSPITAL_COMMUNITY)
Admission: RE | Admit: 2011-03-14 | Discharge: 2011-03-14 | Disposition: A | Discharge status: Home / Self Care | Payer: Medicare Other | Source: Ambulatory Visit | Attending: General Surgery | Admitting: General Surgery

## 2011-03-14 ENCOUNTER — Ambulatory Visit (HOSPITAL_BASED_OUTPATIENT_CLINIC_OR_DEPARTMENT_OTHER)
Admission: RE | Admit: 2011-03-14 | Discharge: 2011-03-14 | Disposition: A | Discharge status: Home / Self Care | Payer: Medicare Other | Source: Ambulatory Visit | Attending: General Surgery | Admitting: General Surgery

## 2011-03-14 DIAGNOSIS — C50919 Malignant neoplasm of unspecified site of unspecified female breast: Secondary | ICD-10-CM

## 2011-03-14 DIAGNOSIS — C50419 Malignant neoplasm of upper-outer quadrant of unspecified female breast: Secondary | ICD-10-CM

## 2011-03-14 DIAGNOSIS — Z01812 Encounter for preprocedural laboratory examination: Secondary | ICD-10-CM | POA: Insufficient documentation

## 2011-03-14 DIAGNOSIS — K449 Diaphragmatic hernia without obstruction or gangrene: Secondary | ICD-10-CM | POA: Insufficient documentation

## 2011-03-14 DIAGNOSIS — K219 Gastro-esophageal reflux disease without esophagitis: Secondary | ICD-10-CM | POA: Insufficient documentation

## 2011-03-14 DIAGNOSIS — I69998 Other sequelae following unspecified cerebrovascular disease: Secondary | ICD-10-CM | POA: Insufficient documentation

## 2011-03-14 DIAGNOSIS — I10 Essential (primary) hypertension: Secondary | ICD-10-CM | POA: Insufficient documentation

## 2011-03-14 DIAGNOSIS — Z803 Family history of malignant neoplasm of breast: Secondary | ICD-10-CM | POA: Insufficient documentation

## 2011-03-14 HISTORY — PX: BREAST MAMMOSITE: SHX5264

## 2011-03-14 LAB — POCT HEMOGLOBIN-HEMACUE: Hemoglobin: 13.8 g/dL (ref 12.0–15.0)

## 2011-03-14 SURGERY — BREAST LUMPECTOMY WITH NEEDLE LOCALIZATION AND AXILLARY SENTINEL LYMPH NODE BX
Anesthesia: General | Site: Breast | Laterality: Right | Wound class: Clean

## 2011-03-14 MED ORDER — FENTANYL CITRATE 0.05 MG/ML IJ SOLN
INTRAMUSCULAR | Status: DC | PRN
  Administered 2011-03-14: 25 ug via INTRAVENOUS
  Administered 2011-03-14: 50 ug via INTRAVENOUS
  Administered 2011-03-14: 25 ug via INTRAVENOUS

## 2011-03-14 MED ORDER — PROPOFOL 10 MG/ML IV EMUL
INTRAVENOUS | Status: DC | PRN
  Administered 2011-03-14: 100 mg via INTRAVENOUS
  Administered 2011-03-14: 200 mg via INTRAVENOUS

## 2011-03-14 MED ORDER — TECHNETIUM TC 99M SULFUR COLLOID FILTERED
1.0000 | Freq: Once | INTRAVENOUS | Status: AC | PRN
  Administered 2011-03-14: 1 via INTRADERMAL

## 2011-03-14 MED ORDER — OXYCODONE-ACETAMINOPHEN 5-325 MG PO TABS: 1.0000 | ORAL_TABLET | Freq: Four times a day (QID) | ORAL | Status: AC | PRN

## 2011-03-14 MED ORDER — DEXAMETHASONE SODIUM PHOSPHATE 4 MG/ML IJ SOLN
INTRAMUSCULAR | Status: DC | PRN
  Administered 2011-03-14: 10 mg via INTRAVENOUS

## 2011-03-14 MED ORDER — ACETAMINOPHEN 10 MG/ML IV SOLN
1000.0000 mg | Freq: Once | INTRAVENOUS | Status: AC
  Administered 2011-03-14: 1000 mg via INTRAVENOUS

## 2011-03-14 MED ORDER — MIDAZOLAM HCL 2 MG/2ML IJ SOLN
0.5000 mg | INTRAMUSCULAR | Status: DC | PRN
  Administered 2011-03-14: 2 mg via INTRAVENOUS

## 2011-03-14 MED ORDER — FENTANYL CITRATE 0.05 MG/ML IJ SOLN
50.0000 ug | INTRAMUSCULAR | Status: DC | PRN
  Administered 2011-03-14: 50 ug via INTRAVENOUS

## 2011-03-14 MED ORDER — METOCLOPRAMIDE HCL 5 MG/ML IJ SOLN: 10.0000 mg | Freq: Once | INTRAMUSCULAR | Status: DC | PRN

## 2011-03-14 MED ORDER — BACITRACIN ZINC 500 UNIT/GM EX OINT
TOPICAL_OINTMENT | CUTANEOUS | Status: DC | PRN
  Administered 2011-03-14: 1 via TOPICAL

## 2011-03-14 MED ORDER — OXYCODONE HCL 5 MG PO TABS
5.0000 mg | ORAL_TABLET | Freq: Once | ORAL | Status: AC
  Administered 2011-03-14: 5 mg via ORAL

## 2011-03-14 MED ORDER — SUCCINYLCHOLINE CHLORIDE 20 MG/ML IJ SOLN
INTRAMUSCULAR | Status: DC | PRN
  Administered 2011-03-14: 100 mg via INTRAVENOUS

## 2011-03-14 MED ORDER — METOCLOPRAMIDE HCL 5 MG/ML IJ SOLN
INTRAMUSCULAR | Status: DC | PRN
  Administered 2011-03-14: 5 mg via INTRAVENOUS

## 2011-03-14 MED ORDER — MORPHINE SULFATE 2 MG/ML IJ SOLN: 0.0500 mg/kg | INTRAMUSCULAR | Status: DC | PRN

## 2011-03-14 MED ORDER — LIDOCAINE HCL (CARDIAC) 20 MG/ML IV SOLN
INTRAVENOUS | Status: DC | PRN
  Administered 2011-03-14: 50 mg via INTRAVENOUS

## 2011-03-14 MED ORDER — FENTANYL CITRATE 0.05 MG/ML IJ SOLN
25.0000 ug | INTRAMUSCULAR | Status: DC | PRN
  Administered 2011-03-14 (×2): 25 ug via INTRAVENOUS

## 2011-03-14 MED ORDER — SODIUM CHLORIDE 0.9 % IJ SOLN
INTRAMUSCULAR | Status: DC | PRN
  Administered 2011-03-14: 15:00:00 via INTRAMUSCULAR

## 2011-03-14 MED ORDER — IOHEXOL 300 MG/ML  SOLN
INTRAMUSCULAR | Status: DC | PRN
  Administered 2011-03-14: 5 mL

## 2011-03-14 MED ORDER — BUPIVACAINE HCL (PF) 0.25 % IJ SOLN
INTRAMUSCULAR | Status: DC | PRN
  Administered 2011-03-14: 6.5 mL

## 2011-03-14 MED ORDER — GLYCOPYRROLATE 0.2 MG/ML IJ SOLN
INTRAMUSCULAR | Status: DC | PRN
  Administered 2011-03-14: 0.2 mg via INTRAVENOUS

## 2011-03-14 MED ORDER — ONDANSETRON HCL 4 MG/2ML IJ SOLN
INTRAMUSCULAR | Status: DC | PRN
  Administered 2011-03-14 (×2): 4 mg via INTRAVENOUS

## 2011-03-14 MED ORDER — CEFAZOLIN SODIUM-DEXTROSE 2-3 GM-% IV SOLR
2.0000 g | INTRAVENOUS | Status: AC
  Administered 2011-03-14: 2 g via INTRAVENOUS

## 2011-03-14 MED ORDER — LACTATED RINGERS IV SOLN
INTRAVENOUS | Status: DC
  Administered 2011-03-14 (×3): via INTRAVENOUS

## 2011-03-14 SURGICAL SUPPLY — 81 items
ADH SKN CLS APL DERMABOND .7 (GAUZE/BANDAGES/DRESSINGS)
APL SKNCLS STERI-STRIP NONHPOA (GAUZE/BANDAGES/DRESSINGS) ×2
APPLICATOR COTTON TIP 6IN STRL (MISCELLANEOUS) IMPLANT
APPLIER CLIP 9.375 MED OPEN (MISCELLANEOUS)
APR CLP MED 9.3 20 MLT OPN (MISCELLANEOUS)
BANDAGE ELASTIC 6 VELCRO ST LF (GAUZE/BANDAGES/DRESSINGS) IMPLANT
BENZOIN TINCTURE PRP APPL 2/3 (GAUZE/BANDAGES/DRESSINGS) ×3 IMPLANT
BINDER BREAST LRG (GAUZE/BANDAGES/DRESSINGS) IMPLANT
BINDER BREAST MEDIUM (GAUZE/BANDAGES/DRESSINGS) IMPLANT
BINDER BREAST XLRG (GAUZE/BANDAGES/DRESSINGS) ×1 IMPLANT
BINDER BREAST XXLRG (GAUZE/BANDAGES/DRESSINGS) IMPLANT
BLADE HEX COATED 2.75 (ELECTRODE) ×3 IMPLANT
BLADE SURG 15 STRL LF DISP TIS (BLADE) ×2 IMPLANT
BLADE SURG 15 STRL SS (BLADE) ×3
BNDG COHESIVE 4X5 TAN STRL (GAUZE/BANDAGES/DRESSINGS) IMPLANT
CANISTER SUCTION 1200CC (MISCELLANEOUS) ×1 IMPLANT
CHLORAPREP W/TINT 26ML (MISCELLANEOUS) ×3 IMPLANT
CLIP APPLIE 9.375 MED OPEN (MISCELLANEOUS) IMPLANT
CLIP TI MEDIUM 6 (CLIP) IMPLANT
CLIP TI WIDE RED SMALL 6 (CLIP) IMPLANT
CLOTH BEACON ORANGE TIMEOUT ST (SAFETY) ×3 IMPLANT
COVER MAYO STAND STRL (DRAPES) ×3 IMPLANT
COVER PROBE 5X48 (MISCELLANEOUS) ×3
COVER PROBE W GEL 5X96 (DRAPES) ×3 IMPLANT
COVER TABLE BACK 60X90 (DRAPES) ×3 IMPLANT
DECANTER SPIKE VIAL GLASS SM (MISCELLANEOUS) ×2 IMPLANT
DERMABOND ADVANCED (GAUZE/BANDAGES/DRESSINGS)
DERMABOND ADVANCED .7 DNX12 (GAUZE/BANDAGES/DRESSINGS) IMPLANT
DEVICE CAVITY EVALUATION 9031 (MISCELLANEOUS) ×3 IMPLANT
DEVICE DUBIN W/COMP PLATE 8390 (MISCELLANEOUS) ×1 IMPLANT
DRAIN CHANNEL 19F RND (DRAIN) IMPLANT
DRAPE LAPAROSCOPIC ABDOMINAL (DRAPES) ×1 IMPLANT
DRAPE LAPAROTOMY TRNSV 102X78 (DRAPE) ×2 IMPLANT
DRAPE U-SHAPE 76X120 STRL (DRAPES) IMPLANT
DRSG TEGADERM 4X4.75 (GAUZE/BANDAGES/DRESSINGS) ×5 IMPLANT
ELECT COATED BLADE 2.86 ST (ELECTRODE) ×3 IMPLANT
ELECT REM PT RETURN 9FT ADLT (ELECTROSURGICAL) ×3
ELECTRODE REM PT RTRN 9FT ADLT (ELECTROSURGICAL) ×2 IMPLANT
EVACUATOR SILICONE 100CC (DRAIN) IMPLANT
GAUZE SPONGE 4X4 12PLY STRL LF (GAUZE/BANDAGES/DRESSINGS) IMPLANT
GLOVE BIO SURGEON STRL SZ7 (GLOVE) ×4 IMPLANT
GLOVE BIOGEL PI IND STRL 7.0 (GLOVE) IMPLANT
GLOVE BIOGEL PI IND STRL 7.5 (GLOVE) ×2 IMPLANT
GLOVE BIOGEL PI INDICATOR 7.0 (GLOVE) ×1
GLOVE BIOGEL PI INDICATOR 7.5 (GLOVE) ×2
GLOVE ECLIPSE 6.5 STRL STRAW (GLOVE) ×1 IMPLANT
GOWN PREVENTION PLUS XLARGE (GOWN DISPOSABLE) ×5 IMPLANT
KIT CVR 48X5XPRB PLUP LF (MISCELLANEOUS) ×2 IMPLANT
KIT MARKER MARGIN INK (KITS) ×3 IMPLANT
NDL HYPO 25X1 1.5 SAFETY (NEEDLE) ×4 IMPLANT
NDL SAFETY ECLIPSE 18X1.5 (NEEDLE) ×2 IMPLANT
NEEDLE HYPO 18GX1.5 SHARP (NEEDLE)
NEEDLE HYPO 25X1 1.5 SAFETY (NEEDLE) ×6 IMPLANT
NS IRRIG 1000ML POUR BTL (IV SOLUTION) ×1 IMPLANT
PACK BASIN DAY SURGERY FS (CUSTOM PROCEDURE TRAY) ×3 IMPLANT
PEN SKIN MARKING BROAD TIP (MISCELLANEOUS) IMPLANT
PENCIL BUTTON HOLSTER BLD 10FT (ELECTRODE) ×3 IMPLANT
PIN SAFETY STERILE (MISCELLANEOUS) IMPLANT
SLEEVE SCD COMPRESS KNEE MED (MISCELLANEOUS) ×3 IMPLANT
SPONGE GAUZE 4X4 12PLY (GAUZE/BANDAGES/DRESSINGS) ×1 IMPLANT
SPONGE INTESTINAL PEANUT (DISPOSABLE) IMPLANT
SPONGE LAP 18X18 X RAY DECT (DISPOSABLE) IMPLANT
SPONGE LAP 4X18 X RAY DECT (DISPOSABLE) ×4 IMPLANT
STAPLER VISISTAT 35W (STAPLE) ×1 IMPLANT
STOCKINETTE IMPERVIOUS LG (DRAPES) IMPLANT
STRIP CLOSURE SKIN 1/2X4 (GAUZE/BANDAGES/DRESSINGS) ×3 IMPLANT
SUT MNCRL AB 4-0 PS2 18 (SUTURE) ×5 IMPLANT
SUT SILK 2 0 SH (SUTURE) IMPLANT
SUT VIC AB 2-0 SH 27 (SUTURE) ×9
SUT VIC AB 2-0 SH 27XBRD (SUTURE) ×2 IMPLANT
SUT VIC AB 3-0 SH 27 (SUTURE) ×12
SUT VIC AB 3-0 SH 27X BRD (SUTURE) ×2 IMPLANT
SUT VICRYL 4-0 PS2 18IN ABS (SUTURE) ×3 IMPLANT
SUT VICRYL AB 3 0 TIES (SUTURE) IMPLANT
SYR CONTROL 10ML LL (SYRINGE) ×6 IMPLANT
TAPE CLOTH SURG 4X10 WHT LF (GAUZE/BANDAGES/DRESSINGS) ×1 IMPLANT
TOWEL OR 17X24 6PK STRL BLUE (TOWEL DISPOSABLE) ×3 IMPLANT
TOWEL OR NON WOVEN STRL DISP B (DISPOSABLE) ×3 IMPLANT
TUBE CONNECTING 20X1/4 (TUBING) ×1 IMPLANT
WATER STERILE IRR 1000ML POUR (IV SOLUTION) ×2 IMPLANT
YANKAUER SUCT BULB TIP NO VENT (SUCTIONS) ×1 IMPLANT

## 2011-03-14 NOTE — Op Note (Signed)
Preoperative diagnosis: Clinical stage I right breast cancer Postoperative diagnosis: SAA Procedure: 1. Right breast wire guided lumpectomy 2.  Injection of methylene blue dye for sentinel node identification 3.  Right axillary sentinel node biopsy 4.  CED placement  Surgeon: Dr. Harden Mo Anesthesia: GETA EBL: minimal Complications: none Drains: none Specimen: right breast tissue marked with paint kit, right axillary sentinel node x 2 Sponge and needle count correct x2 Disposition of patient to RR in stable condition  Indications: This is a 10 yof with MRI found right breast cancer due to high risk with family history.  Biopsy showed invasive ductal carcinoma.  We discussed all options.  We decided after multidisciplinary evaluation that we would proceed with BCT, sentinel node biopsy and attempt MammoSite therapy.  Risks and benefits were discussed.  Procedure:  The patient was first taken to the breast center and had a wire placed by Dr. Dominga Ferry.  I had the mammograms available for my review.  After informed consent was obtained, she was then taken to the operating room.  She was then placed under anesthesia with an LMA.  This was later transitioned to an ETT.  A surgical timeout was then performed.  I then cleansed the areola and injected a cc of methylene blue dye and saline in all 4 cardinal positions around the areola.  This was massaged for two minutes.  Her right breast was then prepped and draped in the standard fashion.    I made an elliptical incision overlying the tumor.  She still had a large hematoma present.  I then used cautery to excise a skin paddle as well as the entire lesion.  A mammogram was then taken that showed the lesion and clip right in the center.  I marked this with the paint kit.  I then packed this with a sponge. I turned my attention to the axilla.  I made an incision below the axillary hairline.  I entered the axillary fascia.  I identified two sentinel  nodes with counts of 280 and 125.  The background radioactivity was less than 10.  Hemostasis was obtained.  I then closed the axillary fascia with 2-0 vicryl.  The skin was closed with 3-0 vicryl and 4-0 monocryl.   I then obtained hemostasis in the breast cavity.  I made a 1 cm incision laterally in the breast and used the trocar to enter the cavity.  I then placed the CED device after testing it.  I injected 35 cc into it and it filled the cavity completely.  I removed the fluid.  I then closed the tissue in several layers with 2-0 vicryl.  I then closed skin with 3-0 vicryl and 4-0 monocryl.  I then placed 35 cc of <10% contrast mixture into the CED.  Ultrasound showed a 1 cm margin from skin and appeared to be in good position.  Steri strips and sterile dressings were placed.  She tolerated this well, was extubated in OR and transferred to PACU stable.

## 2011-03-14 NOTE — Transfer of Care (Signed)
Immediate Anesthesia Transfer of Care Note  Patient: Michelle [name]  Procedure(s) Performed: Procedure(s) (LRB): BREAST LUMPECTOMY WITH NEEDLE LOCALIZATION AND AXILLARY SENTINEL LYMPH NODE BX (Right) MAMMOSITE BREAST (Right)  Patient Location: PACU  Anesthesia Type: General  Level of Consciousness: awake  Airway & Oxygen Therapy: Patient Spontanous Breathing  Post-op Assessment: Report given to PACU RN and Post -op Vital signs reviewed and stable  Post vital signs: Reviewed and stable  Complications: No apparent anesthesia complications

## 2011-03-14 NOTE — H&P (View-Only) (Signed)
Subjective:     Patient ID: Michelle [name], female   DOB: [birth year], 78 y.o.   MRN: [id number]  HPI 26 yof with newly diagnosed right breast cancer.  We discussed all her options at last visit and she has now seen med onc and rad onc.  She returns today and we discussed with her family for about 40 minutes options including lumpectomy/sn then waiting for path, lump/sn with antiestrogen therapy, lump/sn with mammosite balloon placement or lump/sn with standard xrt.  She has no complaints since last visit  Review of Systems     Objective:   Physical Exam Deferred today as this was conversation about surgical plan    Assessment:     Clinical stage I right breast cancer    Plan:     We discussed all options.  She would like to proceed with lumpectomy, snbx and CED placement at time of surgery.  We discussed Mammosite in detail today.  We specifically discussed criteria and need to review final path before deciding to place brachytherapy catheter.  She understands surgery will be done Monday followed by change from CED to multi-lumen cathter on Wednesday followed by therapy which would be completed the next week.  If she is not a candidate for Mammosite then I will just remove the balloon on Monday after final path reviewed and she will need to proceed with antiestrogen and/or standard radiation therapy.  We discussed outcomes associated with each and she is very hesitant to take antiestrogen therapy.  We discussed change of catheter, long term seroma at site of device and wound complications.  She is comfortable with this plan.

## 2011-03-14 NOTE — Anesthesia Preprocedure Evaluation (Signed)
Anesthesia Evaluation  Patient identified by MRN, date of birth, ID band Patient awake    Reviewed: Allergy & Precautions, H&P , NPO status , Patient's Chart, lab work & pertinent test results, reviewed documented beta blocker date and time   Airway Mallampati: II TM Distance: >3 FB Neck ROM: full    Dental   Pulmonary neg pulmonary ROS,          Cardiovascular hypertension, On Medications     Neuro/Psych  Neuromuscular disease CVA, Residual Symptoms Negative Psych ROS   GI/Hepatic negative GI ROS, Neg liver ROS, hiatal hernia, GERD-  Medicated and Controlled,  Endo/Other  Negative Endocrine ROS  Renal/GU negative Renal ROS  Genitourinary negative   Musculoskeletal   Abdominal   Peds  Hematology negative hematology ROS (+)   Anesthesia Other Findings See surgeon's H&P   Reproductive/Obstetrics negative OB ROS                           Anesthesia Physical Anesthesia Plan  ASA: III  Anesthesia Plan: General   Post-op Pain Management:    Induction: Intravenous  Airway Management Planned: LMA  Additional Equipment:   Intra-op Plan:   Post-operative Plan: Extubation in OR  Informed Consent: I have reviewed the patients History and Physical, chart, labs and discussed the procedure including the risks, benefits and alternatives for the proposed anesthesia with the patient or authorized representative who has indicated his/her understanding and acceptance.     Plan Discussed with: CRNA and Surgeon  Anesthesia Plan Comments:         Anesthesia Quick Evaluation

## 2011-03-14 NOTE — Anesthesia Procedure Notes (Addendum)
Procedure Name: LMA Insertion Performed by: Sharyne Richters Pre-anesthesia Checklist: Patient identified, Timeout performed, Emergency Drugs available, Suction available and Patient being monitored Patient Re-evaluated:Patient Re-evaluated prior to inductionOxygen Delivery Method: Circle System Utilized Preoxygenation: Pre-oxygenation with 100% oxygen Intubation Type: IV induction Ventilation: Mask ventilation without difficulty LMA: LMA with gastric port inserted LMA Size: 4.0 Number of attempts: 1 Placement Confirmation: breath sounds checked- equal and bilateral and positive ETCO2 Tube secured with: Tape Dental Injury: Teeth and Oropharynx as per pre-operative assessment    Procedure Name: Intubation Performed by: Sharyne Richters Grade View: Grade I Tube size: 7.0 mm Number of attempts: 1 Airway Equipment and Method: video-laryngoscopy Placement Confirmation: breath sounds checked- equal and bilateral,  ETT inserted through vocal cords under direct vision and positive ETCO2 Secured at: 22 cm Tube secured with: Tape Dental Injury: Teeth and Oropharynx as per pre-operative assessment

## 2011-03-14 NOTE — Interval H&P Note (Signed)
History and Physical Interval Note:  03/14/2011 2:45 PM  Michelle [name]  has presented today for surgery, with the diagnosis of right breast cancer  The various methods of treatment have been discussed with the patient and family. After consideration of risks, benefits and other options for treatment, the patient has consented to  Procedure(s): BREAST LUMPECTOMY WITH NEEDLE LOCALIZATION AND AXILLARY SENTINEL LYMPH NODE BX Placement of CED as a surgical intervention .  The patients' history has been reviewed, patient examined, no change in status, stable for surgery.  I have reviewed the patients' chart and labs.  Questions were answered to the patient's satisfaction.     [name]

## 2011-03-14 NOTE — Anesthesia Postprocedure Evaluation (Signed)
Anesthesia Post Note  Patient: Michelle [name]  Procedure(s) Performed: Procedure(s) (LRB): BREAST LUMPECTOMY WITH NEEDLE LOCALIZATION AND AXILLARY SENTINEL LYMPH NODE BX (Right) MAMMOSITE BREAST (Right)  Anesthesia type: General  Patient location: PACU  Post pain: Pain level controlled  Post assessment: Patient's Cardiovascular Status Stable  Last Vitals:  Filed Vitals:   03/14/11 1715  BP: 119/78  Pulse: 64  Temp:   Resp: 14    Post vital signs: Reviewed and stable  Level of consciousness: alert  Complications: No apparent anesthesia complications

## 2011-03-15 ENCOUNTER — Telehealth (INDEPENDENT_AMBULATORY_CARE_PROVIDER_SITE_OTHER): Payer: Self-pay

## 2011-03-15 ENCOUNTER — Encounter (HOSPITAL_BASED_OUTPATIENT_CLINIC_OR_DEPARTMENT_OTHER): Payer: Self-pay | Admitting: General Surgery

## 2011-03-15 NOTE — Telephone Encounter (Signed)
Pts daughter called WR:[id number] plavix and aspirin. When to restart? Per Dr Dwain Sarna via phone pts daughter advised pt is to restart aspirin today and wait until after Thursdays office visit and he will decide when pt is to start plavix.

## 2011-03-16 ENCOUNTER — Ambulatory Visit
Admission: RE | Admit: 2011-03-16 | Discharge: 2011-03-16 | Disposition: A | Discharge status: Home / Self Care | Payer: Medicare Other | Source: Ambulatory Visit | Attending: Radiation Oncology | Admitting: Radiation Oncology

## 2011-03-16 ENCOUNTER — Ambulatory Visit (INDEPENDENT_AMBULATORY_CARE_PROVIDER_SITE_OTHER): Payer: Medicare Other | Admitting: General Surgery

## 2011-03-17 ENCOUNTER — Ambulatory Visit
Admission: RE | Admit: 2011-03-17 | Discharge: 2011-03-17 | Disposition: A | Discharge status: Home / Self Care | Payer: Medicare Other | Source: Ambulatory Visit | Attending: Radiation Oncology | Admitting: Radiation Oncology

## 2011-03-17 ENCOUNTER — Encounter (INDEPENDENT_AMBULATORY_CARE_PROVIDER_SITE_OTHER): Payer: Self-pay | Admitting: General Surgery

## 2011-03-17 ENCOUNTER — Ambulatory Visit (INDEPENDENT_AMBULATORY_CARE_PROVIDER_SITE_OTHER): Payer: Medicare Other | Admitting: General Surgery

## 2011-03-17 ENCOUNTER — Ambulatory Visit: Payer: Medicare Other

## 2011-03-17 VITALS — BP 132/79 | HR 58 | Temp 98.1°F | Resp 16 | Ht 67.0 in | Wt 208.4 lb

## 2011-03-17 DIAGNOSIS — C50919 Malignant neoplasm of unspecified site of unspecified female breast: Secondary | ICD-10-CM

## 2011-03-17 DIAGNOSIS — Z853 Personal history of malignant neoplasm of breast: Secondary | ICD-10-CM | POA: Insufficient documentation

## 2011-03-17 DIAGNOSIS — Z09 Encounter for follow-up examination after completed treatment for conditions other than malignant neoplasm: Secondary | ICD-10-CM

## 2011-03-17 DIAGNOSIS — C50911 Malignant neoplasm of unspecified site of right female breast: Secondary | ICD-10-CM | POA: Insufficient documentation

## 2011-03-17 NOTE — Progress Notes (Signed)
Name: Michelle [name]   MRN: [id number]  Date:  03/17/2011   DOB: May 24, 1932  Status:outpatient    DIAGNOSIS: Breast cancer.  CONSENT VERIFIED: yes   SET UP: Patient is setup prone  Today, Michelle [name] was brought to the CT simulator.  She was scanned and the MammoSite balloon was auto-contoured, expansions of 1, 1.2 and 1.3 cm way beyond the balloon surface.  The skin to surface distance was excellent.  The PTV expansion did not protrude through the skin surface.  There was excellent balloon conformality within the lumpectomy cavity without any significant seroma packets around the balloon.  The volume of the contour balloon closely matched that which was documented in the operating room.    A scout image was then taken on the CT simulator.  There also appeared to be acceptable balloon symmetry.    The CT images were then transferred to the Plato high-dose rate planning software where acceptable symmetry and conformality was noted.  A 3D dose to the 100% isodose line was generated using the [facility] planning software.  The plan was reviewed on several axial slices.  Given the patient's excellent balloon to skin distance, conformance and symmetry, the patient was felt to be a good candidate for brachytherapy.  The plan is for the patient to have 34 Gy delivered to a distance of 1 cm beyond the MammoSite balloon surface.  This will be given in 10 fractions at 3.4 Gy per fraction time x10 fractions delivered b.i.d. within five days.  Three dimensional treatment planning was required to optimize dose delivering to ensure that the normal structures such as the skin were not exceeded.  V90 - 100% D90 - 100% V200 - 7.3 cc V150 - 31 cc Max skin dose <50% Max rib dose <50%

## 2011-03-17 NOTE — Progress Notes (Signed)
Subjective:     Patient ID: SHAQUEENA MAUCERI, female   DOB: Jul 28, 1932, 78 y.o.   MRN: [id number]  HPI 32 her old female who underwent a right breast lumpectomy and sentinel lymph node biopsy on Monday. She returns today doing well. She is here to have her CED changed out to a MammoSite catheter. Her pathology has returned as a stage I breast cancer. This is a 5.2 mm grade 1 cancer with greater than 5 mm margins in all directions. 2 nodes are negative. She has been seen in radiation oncology the balloon appears to be in good position in order to plan on using a single lumen catheter today.  Review of Systems     Objective:   Physical Exam  Pulmonary/Chest:         Small hematoma at axillary incision, CED in place, no infection        Assessment:     Stage I right breast cancer s/p lumpectomy with neg margins, neg nodes    Plan:     We discussed her pathology. We then discussed a switch to the MammoSite catheter. I cleansed the area. I then tested the MammoSite catheter. It was functional. I left 35 cc of the contrast mixture in the balloon from the operating room. I then withdrew this. I then removed the CED. I then placed the single lumen catheter into position and inflated this to 35 cc with the contrast mixture. This appeared to be in good position both by palpation as well as by ultrasound. Dressings were then placed. She is an sent over to radiation oncology.

## 2011-03-18 ENCOUNTER — Ambulatory Visit: Payer: Medicare Other | Admitting: Radiation Oncology

## 2011-03-18 ENCOUNTER — Ambulatory Visit
Admission: RE | Admit: 2011-03-18 | Discharge: 2011-03-18 | Disposition: A | Discharge status: Home / Self Care | Payer: Medicare Other | Source: Ambulatory Visit | Attending: Radiation Oncology | Admitting: Radiation Oncology

## 2011-03-18 ENCOUNTER — Telehealth: Payer: Self-pay | Admitting: Radiation Oncology

## 2011-03-18 DIAGNOSIS — C50919 Malignant neoplasm of unspecified site of unspecified female breast: Secondary | ICD-10-CM

## 2011-03-18 NOTE — Progress Notes (Signed)
Encounter addended by: Ardell Isaacs, RN on: 03/18/2011  4:25 PM<BR>     Documentation filed: Notes Section

## 2011-03-18 NOTE — Progress Notes (Addendum)
Patient brought to nursing for dressing change of right breast mammosite .Has significant bruising of right incisional area covered with steri-strips. Has only minute amount of serous drainage. Area cleaned with normal saline and neosporin applied. Sterile drain sponge applied and secured with abd pad and mesh netting. Site around mammosite balloon incision clean, dry and intact.

## 2011-03-18 NOTE — Progress Notes (Signed)
Received patient in the clinic following simulation. Patient alert and oriented to person, place, and time. No distress noted. Steady gait noted. Pleasant affect noted. Patient denies pain at this time. Cleansed right breast mammosite with normal saline. Minimal bruising noted above mammosite catheter and a scant amount of serosanguinous drainage oozing from catheter insertion site. Applied neosporin around insertion site then a drainage gauze. Applied 4x4 gauze and ABD pad over drainage gauze and secured all with netting. Assisted patient to dress. Discharged to lobby.

## 2011-03-18 NOTE — Progress Notes (Signed)
   Department of Radiation Oncology  Phone:  [phone] Fax:        [phone]   [facility] Radiation Oncology HDR Breast Brachytherapy Procedure Note   Name: BRIANNA BENNETT             MRN: [id number]  Date: 03/18/2011  DOB: Sep 05, 1932  Status:outpatient   MV:[id number] Sherilyn Cooter, MD, MD  Emelia Loron, MD     DIAGNOSIS: The encounter diagnosis was Breast cancer, female.    PROCEDURE:  High Dose Rate MammoSite Brachytherapy     CURRENT FRACTION:2    PLANNED FRACTION: 10   SIMULATION VERIFICATION: TABRINA ESTY was brought to the CT planning suite and simulation verification was performed to verify the balloon symmetry and inflation.  I reviewed these films and I approved treatment.   NARRATIVE: She was brought to the HDR treatment suite.  Under my direct supervision, the afterloading device was attached to the MammoSite catheter and the Iridium-192 source was delivered remotely to the single planned dwell position.  The planned treatment was completed.  Following the treatment, a survey of the room showed no persistent radioactivity.  No complications occurred during the course of therapy.   DOSIMETRY:  In order to deliver the fractional dose of 3.4 Gy, the dwell time was calculated based on the current source activity.  The total dwell time was 449 seconds.  PLAN:  The patient will return for follow-up in Monday for her 3rd fraction.    ------------------------------------------------  Artist Pais. Kathrynn Running, M.D.

## 2011-03-18 NOTE — Progress Notes (Signed)
Encounter addended by: Ardell Isaacs, RN on: 03/18/2011 12:11 PM<BR>     Documentation filed: Notes Section

## 2011-03-18 NOTE — Telephone Encounter (Signed)
Sent pt RO billing information via mail.  Dx: 174.9 Female Breast, NOS (excludes Skin of breast T-173.5)  Attending Rad: Dr. Iona Hansen Tx: HDR Mammosite

## 2011-03-18 NOTE — Progress Notes (Signed)
Received patient in the clinic today following treatment for mammosite dressing change. Patient is alert and oriented to person, place, and time. No distress noted. Steady gait noted. Patient denies pain. Pleasant affect noted. Right breast with bruising but no drainage noted. Cleanse around catheter insertion site with normal saline. Applied neosporin ointment to catheter insertion site then drainage dressing. Applied 4x4 gauze and abd pad over drainage dressing. Secured all dressing with mesh netting. Discharged patient to the lobby with her family.

## 2011-03-21 ENCOUNTER — Ambulatory Visit
Admission: RE | Admit: 2011-03-21 | Discharge: 2011-03-21 | Disposition: A | Discharge status: Home / Self Care | Payer: Medicare Other | Source: Ambulatory Visit | Attending: Radiation Oncology | Admitting: Radiation Oncology

## 2011-03-21 ENCOUNTER — Encounter: Payer: Self-pay | Admitting: Radiation Oncology

## 2011-03-21 DIAGNOSIS — C50919 Malignant neoplasm of unspecified site of unspecified female breast: Secondary | ICD-10-CM

## 2011-03-21 NOTE — Progress Notes (Signed)
  Radiation Oncology         [phone] ________________________________  [facility] Radiation Oncology HDR Breast Brachytherapy Procedure Note  Name: Michelle [name]           MRN: [id number]  Date: 03/21/2011  DOB: [birth year]  Status:outpatient  WU:[id number] Sherilyn Cooter, MD, MD  Emelia Loron, MD    DIAGNOSIS: 78 yo woman with T1b (0.52 cm) N0, ER Positive, right breast cancer   PROCEDURE:  High Dose Rate MammoSite Brachytherapy    CURRENT FRACTION:3   PLANNED FRACTION: 10  SIMULATION VERIFICATION: Michelle [name] was brought to the CT planning suite and simulation verification was performed to verify the balloon symmetry and inflation.  I reviewed these films and I approved treatment.   NARRATIVE: She was brought to the HDR treatment suite.  Under my direct supervision, the afterloading device was attached to the MammoSite catheter and the Iridium-192 source was delivered remotely to the single planned dwell position.  The planned treatment was completed.  Following the treatment, a survey of the room showed no persistent radioactivity.  No complications occurred during the course of therapy.  DOSIMETRY:  In order to deliver the fractional dose of 3.4 Gy, the dwell time was calculated based on the current source activity.  The total dwell time was 460.8 seconds.  PLAN:  The patient will return to continue treatment this afternoon.  ________________________________  Artist Pais Kathrynn Running, M.D.

## 2011-03-21 NOTE — Progress Notes (Signed)
MAMMOSITE DRESSING APPLIED PER PROTOCOL TO RIGHT BREAST.  CLEANSED WITH STERILE SALINE, NEOSPORIN APPLIED, 2, 4X4'S OVER THIS AND A 1X1 WITH NEOSPORIN ON RAW AREA NEXT TO SITE. THEN COVERED WITH ABD PAD HELD IN PLACE WITH NETTING.  SITE WITH SOME SEROSANQUINESS DRAINAGE AND REDNESS AROUND WOUND.  MULTIPLE BRUISED AREAS NOTE FROM SURGICAL PROCEDURE.  THIS PROCESS WAS DONE TWICE TODAY, ONCE AFTER AM TX AND THEN AFTER EVENING TX AT [phone]

## 2011-03-21 NOTE — Progress Notes (Signed)
HDR MammoSite brachytherapy procedure note  Fraction #4/10 She will to dose 1360 cGy of a prescribed 3400 cGy in 10 sessions. Procedure note: She was taken to the HDR suite.  After verification of her balloon symmetry and diameter the single-lumen MammoSite catheter was attached to the Nucletron HDR apparatus. She was treated to 3 dwell positions to deliver a further 340 cGy with iridium 192. The total treatment time was 462.1 seconds. There were no complications. Plan: To proceed with fraction #5 tomorrow morning.

## 2011-03-22 ENCOUNTER — Ambulatory Visit (HOSPITAL_BASED_OUTPATIENT_CLINIC_OR_DEPARTMENT_OTHER): Payer: Medicare Other | Admitting: Oncology

## 2011-03-22 ENCOUNTER — Encounter: Payer: Self-pay | Admitting: Oncology

## 2011-03-22 ENCOUNTER — Other Ambulatory Visit: Payer: Medicare Other

## 2011-03-22 ENCOUNTER — Ambulatory Visit
Admission: RE | Admit: 2011-03-22 | Discharge: 2011-03-22 | Disposition: A | Discharge status: Home / Self Care | Payer: Medicare Other | Source: Ambulatory Visit | Attending: Radiation Oncology | Admitting: Radiation Oncology

## 2011-03-22 ENCOUNTER — Encounter: Payer: Self-pay | Admitting: Radiation Oncology

## 2011-03-22 ENCOUNTER — Telehealth: Payer: Self-pay | Admitting: Oncology

## 2011-03-22 ENCOUNTER — Encounter: Payer: Self-pay | Admitting: *Deleted

## 2011-03-22 VITALS — BP 130/83 | HR 56 | Temp 98.5°F | Ht 67.0 in | Wt 202.6 lb

## 2011-03-22 DIAGNOSIS — Z170 Estrogen receptor positive status [ER+]: Secondary | ICD-10-CM

## 2011-03-22 DIAGNOSIS — C50919 Malignant neoplasm of unspecified site of unspecified female breast: Secondary | ICD-10-CM

## 2011-03-22 LAB — CBC WITH DIFFERENTIAL/PLATELET
BASO%: 0.7 % (ref 0.0–2.0)
EOS%: 6.3 % (ref 0.0–7.0)
LYMPH%: 17.8 % (ref 14.0–49.7)
MCHC: 33.2 g/dL (ref 31.5–36.0)
MCV: 96.7 fL (ref 79.5–101.0)
MONO%: 8.5 % (ref 0.0–14.0)
Platelets: 261 10*3/uL (ref 145–400)
RBC: 3.96 10*6/uL (ref 3.70–5.45)
WBC: 9 10*3/uL (ref 3.9–10.3)

## 2011-03-22 LAB — COMPREHENSIVE METABOLIC PANEL
ALT: 14 U/L (ref 0–35)
Alkaline Phosphatase: 107 U/L (ref 39–117)
Sodium: 140 mEq/L (ref 135–145)
Total Bilirubin: 0.5 mg/dL (ref 0.3–1.2)
Total Protein: 6.4 g/dL (ref 6.0–8.3)

## 2011-03-22 NOTE — Progress Notes (Signed)
Pt in nursing after radiation treatment for mammosite care. R breast mammosite area has small amt bruising, mammosite cath intact, area dry. Pt states "it is tender, not painful". Area cleansed, Neopsorin, dressings applied per protocol. Pt tol well.

## 2011-03-22 NOTE — Progress Notes (Signed)
Radiation Oncology  HDR Breast Brachytherapy Procedure Note   Name: EARNESTENE ANGELLO MRN: [id number]  Date: 03/22/2011 DOB: 1932-09-03   Status:outpatient    DIAGNOSIS: 78 yo woman with T1b (0.52 cm) N0, ER Positive, right breast cancer   PROCEDURE: High Dose Rate MammoSite Brachytherapy  CURRENT FRACTION:5  PLANNED FRACTION: 10  SIMULATION VERIFICATION: RUWEYDA MACKNIGHT was brought to the CT planning suite and simulation verification was performed to verify the balloon symmetry and inflation. I reviewed these films and I approved treatment.  NARRATIVE: She was brought to the HDR treatment suite. Under my direct supervision, the afterloading device was attached to the MammoSite catheter and the Iridium-192 source was delivered remotely to the single planned dwell position. The planned treatment was completed. Following the treatment, a survey of the room showed no persistent radioactivity. No complications occurred during the course of therapy.   DOSIMETRY: In order to deliver the fractional dose of 3.4 Gy, the dwell time was calculated based on the current source activity. The total dwell time was 465 seconds.   PLAN: The patient will return to continue treatment this afternoon.

## 2011-03-22 NOTE — Progress Notes (Signed)
[facility]  Radiation Oncology  HDR Breast Brachytherapy Procedure Note   Name: Michelle [name] MRN: [id number]  Date: 03/21/2011 DOB: 1932-08-10   Status:outpatient    DIAGNOSIS: 78 yo woman with T1b (0.52 cm) N0, ER Positive, right breast cancer   PROCEDURE: High Dose Rate MammoSite Brachytherapy   CURRENT FRACTION:6  PLANNED FRACTION: 10   SIMULATION VERIFICATION: Michelle [name] was brought to the CT planning suite and simulation verification was performed to verify the balloon symmetry and inflation. I reviewed these films and I approved treatment.  NARRATIVE: She was brought to the HDR treatment suite. Under my direct supervision, the afterloading device was attached to the MammoSite catheter and the Iridium-192 source was delivered remotely to the single planned dwell position. The planned treatment was completed. Following the treatment, a survey of the room showed no persistent radioactivity. No complications occurred during the course of therapy.   DOSIMETRY: In order to deliver the fractional dose of 3.4 Gy, the dwell time was calculated based on the current source activity. The total dwell time was 466 seconds.   PLAN: The patient will return to continue treatment tomorrow morning.

## 2011-03-22 NOTE — Telephone Encounter (Signed)
gve the pt her April 2013 appts °

## 2011-03-22 NOTE — Progress Notes (Signed)
OFFICE PROGRESS NOTE  CC  Judie Petit, MD, MD [address] [place] [address] Dr. Chipper Herb Dr. Emelia Loron  DIAGNOSIS: 23 year with stage IA low grade invasive ductal carcinoma of the right breast s/p lumpectomy and SNL  PRIOR THERAPY:  1. S/p Lumpectomy with SNL on 03/14/11 with CED placement  2. Final pathology revealed ER+/PR+ Her2Heu - low grade invasive ductal cancer with DCIS all surgical margins were negative, 2 SNL negative for metastatic disease (0/2) [id number] 3. Mammosite partial breast radiation from 03/18/11 - 03/25/11  CURRENT THERAPY: mammosite partial breast irradiation  INTERVAL HISTORY: SARALYNN LANGHORST 78 y.o. female returns for follow up visit overall she is doing well. She denies any fevers chills or night sweats, she tolerated the surgery well and has been informed of her pathology results. She is currently receiving partial breast irrradiation by Dr. Dayton Scrape and tolerating it well. She still has some pain the surgical site. Otherwise she is without any complaints. Remainder of the 10 point review of systems is negative.  MEDICAL HISTORY: Past Medical History  Diagnosis Date  . GERD (gastroesophageal reflux disease)   . Hyperlipidemia   . Shingles 2005  . Hypertension   . History of cerebrovascular accident   . Bell's palsy 12/84 (approx)  . Cancer 10/28/2009    squamous cell - upper right arm  . Cancer 10/28/09    basal cell -upper right back  . Fatigue   . Wears glasses   . Voice hoarseness   . Change in vision   . Coughing   . Heartburn   . Bruises easily   . Stroke 09/2007  . Hiatal hernia 2008    ALLERGIES:  is allergic to benadryl; doxycycline; and simvastatin.  MEDICATIONS:  Current Outpatient Prescriptions  Medication Sig Dispense Refill  . Ascorbic Acid (VITAMIN C) 1000 MG tablet Take 1,000 mg by mouth daily. Take 2 daily       . aspirin 325 MG tablet Take 325 mg by mouth at bedtime.        . clopidogrel (PLAVIX)  75 MG tablet TAKE 1 TABLET BY MOUTH EVERY DAY  30 tablet  4  . fluticasone (FLONASE) 50 MCG/ACT nasal spray 2 sprays by Nasal route daily.  16 g  11  . lansoprazole (PREVACID) 30 MG capsule Take 30 mg by mouth daily.        . Multiple Vitamins-Minerals (PRESERVISION AREDS 2) CAPS Take 1 capsule by mouth daily.       Marland Kitchen oxyCODONE-acetaminophen (ROXICET) 5-325 MG per tablet Take 1-2 tablets by mouth every 6 (six) hours as needed for pain.  30 tablet  0  . rosuvastatin (CRESTOR) 10 MG tablet Take 10 mg by mouth daily.          SURGICAL HISTORY:  Past Surgical History  Procedure Date  . Cholecystectomy 2004  . Biospy 10/28/09    Shave Biospy skin Left hand(Acitinic Keratoses), Right Upper Arm (superficial Basal Cell), Right Upper Back(Superficial Basal Cell), Left Neck ( Solar Lentigo & Seborrheic Keratoses)  . Vocal cord poylps 1969    excision  . Breast biopsy 02/11/11    Right Breast Needle Core Biospy - Upper Outer Quadrant; ER/PR 100%, Her-2 Neu neg.; Ki-67 10%  . Tonsillectomy   . Colonoscopy   . Breast mammosite 03/14/2011    Procedure: MAMMOSITE BREAST;  Surgeon: Emelia Loron, MD;  Location: [facility];  Service: General;  Laterality: Right;    REVIEW OF SYSTEMS:  Pertinent  items are noted in HPI.   PHYSICAL EXAMINATION: Resp: clear to auscultation bilaterally and normal percussion bilaterally Cardio: regular rate and rhythm, S1, S2 normal, no murmur, click, rub or gallop GI: soft, non-tender; bowel sounds normal; no masses,  no organomegaly Extremities: extremities normal, atraumatic, no cyanosis or edema Breast Exam: bandage with CED device in place. No evidence of swelling or infection ECOG PERFORMANCE STATUS: 1 - Symptomatic but completely ambulatory  Blood pressure 130/83, pulse 56, temperature 98.5 F (36.9 C), temperature source Oral, height 5\' 7"  (1.702 m), weight 202 lb 9.6 oz (91.899 kg).  LABORATORY DATA: Lab Results  Component Value Date   WBC  9.0 03/22/2011   HGB 12.7 03/22/2011   HCT 38.3 03/22/2011   MCV 96.7 03/22/2011   PLT 261 03/22/2011      Chemistry      Component Value Date/Time   NA 138 03/08/2011 1230   K 4.8 03/08/2011 1230   CL 103 03/08/2011 1230   CO2 28 03/08/2011 1230   BUN 9 03/08/2011 1230   CREATININE 0.82 03/08/2011 1230      Component Value Date/Time   CALCIUM 10.2 03/08/2011 1230   ALKPHOS 100 03/08/2011 1230   AST 20 03/08/2011 1230   ALT 12 03/08/2011 1230   BILITOT 0.3 03/08/2011 1230     Patient Name: RASHMI, TALLENT Accession #: [id number] DOB: 11/21/1932 Age: 29 Gender: F Client Name [place] [place] [facility] Collected Date: 03/14/2011 Received Date: 03/15/2011 Physician:Matthew Wakefield Chart #: MRN # : [id number] Physician cc: Kaylyn Lim, RN Beckie Salts, MD Race:W Visit #: [id number] Dominga Ferry, MD Hal Neer, MD Drue Second, MD REPORT OF SURGICAL PATHOLOGY FINAL DIAGNOSIS Diagnosis 1. Breast, lumpectomy, Right - INVASIVE DUCTAL CARCINOMA, GRADE I/III, SPANNING 0.52 CM. - DUCTAL CARCINOMA IN SITU, LOW GRADE, FOCAL. - THE SURGICAL RESECTION MARGINS ARE NEGATIVE FOR CARCINOMA. - SEE ONCOLOGY TABLE BELOW. 2. Lymph node, sentinel, biopsy, Right Axilla #1 - THERE IS NO EVIDENCE OF CARCINOMA IN 1 OF 1 LYMPH NODE (0/1). 3. Lymph node, sentinel, biopsy, Right Axilla #2 - THERE IS NO EVIDENCE OF CARCINOMA IN 1 OF 1 LYMPH NODE (0/1). Microscopic Comment 1. BREAST, INVASIVE TUMOR, WITH LYMPH NODE SAMPLING Specimen, including laterality: Right breast Procedure: Needle localized lumpectomy Grade: I Tubule formation: 3 Nuclear pleomorphism: 1 Mitotic: 1 Tumor size (glass slide measurement): 0.52 cm Margins: Negative for carcinoma Invasive, distance to closest margin: Greater than 0.5 cm to all margins (glass slide measurements). In-situ, distance to closest margin: Greater than 0.5 cm to all margins (glass slide measurements) Lymphovascular invasion: Not identified Ductal carcinoma in situ:  Present, focal Grade: Low grade Extensive intraductal component: No Lobular neoplasia: Not identified Tumor focality: Unifocal Treatment effect: N/A Extent of tumor: Confined to breast parenchyma Lymph nodes: # examined: 2 1 of 3 Lymph nodes with metastasis: 0 FINAL for DAYNE, DEKAY ([id number]) Microscopic Comment(continued) Breast prognostic profile: [phone] Estrogen receptor: 100%, strong staining intensity Progesterone receptor: 100%, strong staining intensity Her 2 neu: No amplification was detected. The ratio was 1.46. Her 2 neu by CISH will be repeated on current case and the results reported separately. Ki-67: 10% Non-neoplastic breast: Healing biopsy site, fibroadenoma, and fibrocystic changes TNM: pT1b, pN0 Comments: The case was discussed with Dr. Dwain Sarna on 03/16/2011. Pecola Leisure MD Pathologist, Electronic Signature (Case signed 03/16/2011) Specimen Gross and Clinical Information Specimen(s) Obtained: 1. Breast, lumpectomy, Right 2. Lymph node, sentinel, biopsy, Right Axilla #1 3. Lymph node, sentinel, biopsy, Right Axilla #2 Specimen Clinical  RADIOGRAPHIC STUDIES:  Chest 2 View  03/08/2011  *RADIOLOGY REPORT*  Clinical Data: Preop for lumpectomy, cough, hypertension, former smoking history  CHEST - 2 VIEW  Comparison: None.  Findings: No infiltrate or effusion is seen.  There is a small nodular opacity in the right lung apex which may represent scarring, but comparison with prior or follow-up chest x-ray is recommended.  Somewhat prominent interstitial markings are present most likely chronic in nature.  Mediastinal contours appear normal. The heart is within upper limits of normal.  No bony abnormality is seen.  Surgical clips are present in the upper abdomen presumably due to prior cholecystectomy.  IMPRESSION:  1.  No active lung disease.  Chronic changes bilaterally. 2.  Small nodular opacity in the right lung apex may represent pleural thickening.  Compare  with prior or follow-up chest x-ray to assess stability.  Original Report Authenticated By: Juline Patch, M.D.   Nm Sentinel Node Inj-no Rpt (breast)  03/14/2011  CLINICAL DATA: Right breast cancer for sent node biopsy   Sulfur colloid was injected intradermally by the nuclear medicine  technologist for breast cancer sentinel node localization.      ASSESSMENT:  78 year old female with: 1. Stage IA IDC with DCIS ER+/PR+ Her2Neu -, Ki-67 10% s/p lumpectomy with SNL tumor 0.52 cm 0/2 nodes positive for mets 2. Partial breast radiation under progress 3. Recommend anti-estrogen therapy with arimidex or tamoxifen once RT completed   PLAN:  1. Follow up in 6 weeks time   All questions were answered. The patient knows to call the clinic with any problems, questions or concerns. We can certainly see the patient much sooner if necessary.  I spent 20 minutes counseling the patient face to face. The total time spent in the appointment was 30 minutes.    Drue Second, MD Medical/Oncology [facility] [phone] (beeper) [phone] (Office)  03/22/2011, 10:17 AM

## 2011-03-23 ENCOUNTER — Ambulatory Visit
Admission: RE | Admit: 2011-03-23 | Discharge: 2011-03-23 | Disposition: A | Discharge status: Home / Self Care | Payer: Medicare Other | Source: Ambulatory Visit | Attending: Radiation Oncology | Admitting: Radiation Oncology

## 2011-03-23 ENCOUNTER — Other Ambulatory Visit: Payer: Self-pay | Admitting: Internal Medicine

## 2011-03-23 DIAGNOSIS — C50919 Malignant neoplasm of unspecified site of unspecified female breast: Secondary | ICD-10-CM

## 2011-03-23 NOTE — Progress Notes (Signed)
Right breast mammosite dressed per protocol. Small area of redness around insertion site.  Has small blister above mammosite insertion. No drainage visible.

## 2011-03-23 NOTE — Progress Notes (Signed)
[facility]  Radiation Oncology  HDR Breast Brachytherapy Procedure Note   Name: KARYSA HEFT MRN: [id number]  Date: 03/21/2011 DOB: 1932-10-12   Status:outpatient    DIAGNOSIS: 78 yo woman with T1b (0.52 cm) N0, ER Positive, right breast cancer   PROCEDURE: High Dose Rate MammoSite Brachytherapy   CURRENT FRACTION:8  PLANNED FRACTION: 10   SIMULATION VERIFICATION: SOILA PRINTUP was brought to the CT planning suite and simulation verification was performed to verify the balloon symmetry and inflation. I reviewed these films and I approved treatment.  NARRATIVE: She was brought to the HDR treatment suite. Under my direct supervision, the afterloading device was attached to the MammoSite catheter and the Iridium-192 source was delivered remotely to the planned 3 dwell positions. The planned treatment was completed. Following the treatment, a survey of the room showed no persistent radioactivity. No complications occurred during the course of therapy.   DOSIMETRY: In order to deliver the fractional dose of 3.4 Gy, the dwell time was calculated based on the current source activity. The total dwell time was 471 seconds.   PLAN: The patient will return to continue treatment this afternoon.

## 2011-03-23 NOTE — Progress Notes (Signed)
MAMMOSITE DSG APPLIED PER PROTOCOL AT ABOUT 0930 THIS AM.  SITE STILL WITH SCANT AMOUNT OF SEROSANGUINESS DRAINAGE, ALSO NOTED SOME REDNESS AT EDGES OF SITE.  CLEANSED WITH STERILE NS AND NEOSPORIN APPLIED, ALSO APPLIED NEOSPORIN TO TO SMALL AREA ON BREAST FROM TAPE IRRITATION AND COVERED WITH NON ADHERENT DSG, APPLIED 2, 4X4'S OVER MAMMOSITE SITE AND SECURED WITH ABD PAD THEN ALL OF THIS WAS SECURED WITH STRETCH MESH.

## 2011-03-23 NOTE — Progress Notes (Signed)
[facility]  Radiation Oncology  HDR Breast Brachytherapy Procedure Note   Name: Michelle [name] MRN: [id number]  Date: 03/21/2011 DOB: 12-22-1932   Status:outpatient    DIAGNOSIS: 78 yo woman with T1b (0.52 cm) N0, ER Positive, right breast cancer   PROCEDURE: High Dose Rate MammoSite Brachytherapy   CURRENT FRACTION:7  PLANNED FRACTION: 10   SIMULATION VERIFICATION: [name] was brought to the CT planning suite and simulation verification was performed to verify the balloon symmetry and inflation. I reviewed these films and I approved treatment.  NARRATIVE: She was brought to the HDR treatment suite. Under my direct supervision, the afterloading device was attached to the MammoSite catheter and the Iridium-192 source was delivered remotely to the planned dwell positions. The planned treatment was completed. Following the treatment, a survey of the room showed no persistent radioactivity. No complications occurred during the course of therapy.   DOSIMETRY: In order to deliver the fractional dose of 3.4 Gy, the dwell time was calculated based on the current source activity. The total dwell time was 469 seconds.   PLAN: The patient will return to continue treatment this afternoon.

## 2011-03-23 NOTE — Progress Notes (Signed)
Encounter addended by: Amanda Pea, RN on: 03/23/2011 10:49 AM<BR>     Documentation filed: Notes Section

## 2011-03-23 NOTE — Progress Notes (Signed)
Encounter addended by: Tessa Lerner, RN on: 03/23/2011  4:42 PM<BR>     Documentation filed: Notes Section

## 2011-03-23 NOTE — Progress Notes (Signed)
Encounter addended by: Amanda Pea, RN on: 03/23/2011 10:53 AM<BR>     Documentation filed: Visit Diagnoses

## 2011-03-24 ENCOUNTER — Encounter: Payer: Self-pay | Admitting: *Deleted

## 2011-03-24 ENCOUNTER — Encounter: Payer: Self-pay | Admitting: Radiation Oncology

## 2011-03-24 ENCOUNTER — Telehealth: Payer: Self-pay | Admitting: *Deleted

## 2011-03-24 ENCOUNTER — Ambulatory Visit
Admission: RE | Admit: 2011-03-24 | Discharge: 2011-03-24 | Disposition: A | Discharge status: Home / Self Care | Payer: Medicare Other | Source: Ambulatory Visit | Attending: Radiation Oncology | Admitting: Radiation Oncology

## 2011-03-24 DIAGNOSIS — C50919 Malignant neoplasm of unspecified site of unspecified female breast: Secondary | ICD-10-CM

## 2011-03-24 NOTE — Progress Notes (Signed)
HDR MammoSite brachytherapy procedure note  Fraction #10/10  Cumulative dose: 3400 cGy/ 10 sessions  Procedure note: She was taken to the HDR suite. After verification of her balloon symmetry in diameter the single-lumen MammoSite catheter was attached to the Nucletron HDR apparatus. She was treated to 3 dwell positions to deliver a further 340 cGy to the planning target volume with iridium 192. Total treatment time was 475.2 seconds. There were no complications.  Plan: MammoSite catheter was removed after removal of approximately 40 cc of fluid from the balloon. She'll see Dr. Michell Heinrich for a followup visit in one week. She is to contact us if she notes excess drainage or any signs or evidence for infection.

## 2011-03-24 NOTE — Telephone Encounter (Signed)
Confirmed pt's Lisinopril was sent in by [facility] yesterday.  Pt was concerned she only had one left.

## 2011-03-24 NOTE — Progress Notes (Signed)
Pt here for mammosite dressing change,right breast has some serosanguinus drainage right breast incision, per protocol, cleansed area of site with normal saline, then applied neosporin around incision, dry  dressings applied  over site,abd pad over dressing, assisted patient with mesh dressing , assisted with top, pt tolerated well, will see patient back this afternoon for final HDR treatment, will give her 1 week follow up appt and supplies to be given with instructions, self care after d/c home today 9:37 AM

## 2011-03-24 NOTE — Progress Notes (Signed)
Pt completed HDR mammosite radiation treatments, Dr,. Murray in with patient, applied betadine swabs over right breast site,  removed catheter balloon with 40 cc saline, pt tolerated well, applied neosporin to  4x4 dressings x 2 and abd pad  directly over site of breast incision, serous sangous drainage,, patient tolerated well, informed patient to call if fever >100.5, redness/warmth occurs over breast area where treated, pt has follow up appt1/28/13 at 0800 am with Dr. Michell Heinrich, pt , pt gave verbal understanding, d/c patient home 4:43 PM

## 2011-03-24 NOTE — Progress Notes (Signed)
[facility]  Radiation Oncology  HDR Breast Brachytherapy Procedure Note   Name: Michelle [name] MRN: [id number]  Date: 03/21/2011 DOB: 01/09/1933   Status:outpatient    DIAGNOSIS: 78 yo woman with T1b (0.52 cm) N0, ER Positive, right breast cancer   PROCEDURE: High Dose Rate MammoSite Brachytherapy   CURRENT FRACTION:9  PLANNED FRACTION: 10   SIMULATION VERIFICATION: TAKODA SIEDLECKI was brought to the CT planning suite and simulation verification was performed to verify the balloon symmetry and inflation. I reviewed these films and I approved treatment.  NARRATIVE: She was brought to the HDR treatment suite. Under my direct supervision, the afterloading device was attached to the MammoSite catheter and the Iridium-192 source was delivered remotely to the planned 3 dwell positions. The planned treatment was completed. Following the treatment, a survey of the room showed no persistent radioactivity. No complications occurred during the course of therapy.   DOSIMETRY: In order to deliver the fractional dose of 3.4 Gy, the dwell time was calculated based on the current source activity. The total dwell time was 474 seconds.   PLAN: The patient will return to continue treatment this afternoon.

## 2011-03-26 NOTE — Progress Notes (Signed)
[facility]  Radiation Oncology   HDR Breast Brachytherapy Procedure Note   Name: FLOYD LUSIGNAN MRN: [id number]   Date: 03/18/2011 DOB: 03-11-1932   Status:outpatient   QM:VHQION,GEXBM Sherilyn Cooter, MD, MD Emelia Loron, MD   DIAGNOSIS: The encounter diagnosis was Breast cancer, female.   PROCEDURE: High Dose Rate MammoSite Brachytherapy   CURRENT FRACTION:1  PLANNED FRACTION: 10   SIMULATION VERIFICATION: JACARIA COLBURN was brought to the CT planning suite and simulation verification was performed to verify the balloon symmetry and inflation. I reviewed these films and I approved treatment.   NARRATIVE: She was brought to the HDR treatment suite. Under my direct supervision, the afterloading device was attached to the MammoSite catheter and the Iridium-192 source was delivered remotely to the single planned dwell position. The planned treatment was completed. Following the treatment, a survey of the room showed no persistent radioactivity. No complications occurred during the course of therapy.   DOSIMETRY: In order to deliver the fractional dose of 3.4 Gy, the dwell time was calculated based on the current source activity. The total dwell time was 445 seconds.   PLAN: The patient will return for follow-up later today for her 2nd fraction.

## 2011-03-27 NOTE — Progress Notes (Signed)
DIAGNOSIS:  T1b N0 invasive ductal carcinoma of the right breast.  TREATMENT DATES:  03/18/2011 to 03/24/2011.  ANATOMIC REGION TREATED:  Right breast tumor cavity.  BEAM ENERGY:  Iridium 192.  BEAM ARRANGEMENT:  HDR brachytherapy utilizing MammoSite single lumen catheter with 3 dwell positions.  DOSE:  34 Gy at 3.4 Gy per fraction x10 fractions given in a BID technique.  TREATMENT TOLERANCE:  Michelle [name] tolerated her treatments well.  Her MammoSite catheter was removed without incident.  I will plan on seeing her back next week.  She also has regularly scheduled followup with Dr. Dwain Sarna and Dr. Welton Flakes.    ______________________________ Lurline Hare, M.D. SW/MEDQ  D:  03/27/2011  T:  03/27/2011  Job:  [id number]

## 2011-03-31 ENCOUNTER — Ambulatory Visit
Admission: RE | Admit: 2011-03-31 | Discharge: 2011-03-31 | Disposition: A | Discharge status: Home / Self Care | Payer: Medicare Other | Source: Ambulatory Visit | Attending: Radiation Oncology | Admitting: Radiation Oncology

## 2011-03-31 ENCOUNTER — Encounter: Payer: Self-pay | Admitting: Radiation Oncology

## 2011-03-31 ENCOUNTER — Ambulatory Visit: Admission: RE | Admit: 2011-03-31 | Payer: Medicare Other | Source: Ambulatory Visit | Admitting: Radiation Oncology

## 2011-03-31 VITALS — Wt 202.4 lb

## 2011-03-31 DIAGNOSIS — C50919 Malignant neoplasm of unspecified site of unspecified female breast: Secondary | ICD-10-CM

## 2011-03-31 NOTE — Progress Notes (Signed)
VS.....97.6   BP 123/73.....P....58      MAMMO SITE LOOKS GREAT, NOTED SLIGHT REDNESS AROUND SITE WITH SITE BEING CLOSED.  SHE STATED SHE WAS WORRIED ABOUT THE DRIED BLOOD THERE BUT I TOLD HER THAT WAS PART OF THE HEALING PROCESS AND NOT TO PICK AT IT OR BOTHER IT.  SHE DOES C/O SOME SORE AND PAIN AT TIMES BUT TAKES TYLENOL FOR THE PAIN AND IT RELIEVES IT.  HASN'T HAD TO TAKE PAIN MED (PERCOCET)

## 2011-03-31 NOTE — Progress Notes (Signed)
CC:   Drue Second, M.D. Juanetta Gosling, MD Valetta Mole. Swords, MD  DIAGNOSIS:  T1 N0 invasive ductal carcinoma of the right breast.  PREVIOUS RADIATION:  MammoSite brachytherapy to a total dose of 34 Gy completed 03/24/2011.  INTERVAL SINCE TREATMENT:  1 week.  INTERVAL HISTORY:  Ms. Valladares reports for followup today.  She is having some soreness in her right breast.  She is taking Tylenol for this.  She is still doing sponge baths.  She is doing her exercises and is scheduled to see Dr. Welton Flakes and Dr. Dwain Sarna next month.  PHYSICAL EXAMINATION:  Her weight is 202 pounds.  She is pleasant female in no distress sitting comfortably on the exam room table.  She still has Steri-Strips in place over her lumpectomy incision.  She has about a 5 mm area covered with dried blood and good granulation tissue where the MammoSite catheter inserted.  There is no signs of erythema or infection in either one of these places.  Her sentinel lymph node biopsy scar is well-healed.  IMPRESSION:  Stage I breast cancer status post MammoSite brachytherapy with no evidence of infection.  RECOMMENDATIONS:  Ms. Brodbeck looks great.  She has tolerated her treatments well.  I will plan on seeing her back in 3 months.  She has regularly scheduled followup with Dr. Dwain Sarna.  I released her to take showers regularly.  I encouraged her to put Neosporin over the open area of the MammoSite catheter.  She knows she can contact us with any questions or concerns.    ______________________________ Lurline Hare, M.D. SW/MEDQ  D:  03/31/2011  T:  03/31/2011  Job:  [id number]

## 2011-04-06 ENCOUNTER — Ambulatory Visit: Payer: Self-pay | Admitting: Internal Medicine

## 2011-04-06 ENCOUNTER — Ambulatory Visit: Payer: PRIVATE HEALTH INSURANCE | Admitting: Internal Medicine

## 2011-04-19 ENCOUNTER — Ambulatory Visit
Admission: RE | Admit: 2011-04-19 | Discharge: 2011-04-19 | Disposition: A | Discharge status: Home / Self Care | Payer: Medicare Other | Source: Ambulatory Visit | Attending: General Surgery | Admitting: General Surgery

## 2011-04-19 ENCOUNTER — Encounter (INDEPENDENT_AMBULATORY_CARE_PROVIDER_SITE_OTHER): Payer: Medicare Other | Admitting: General Surgery

## 2011-04-19 ENCOUNTER — Ambulatory Visit (INDEPENDENT_AMBULATORY_CARE_PROVIDER_SITE_OTHER): Payer: Medicare Other | Admitting: General Surgery

## 2011-04-19 ENCOUNTER — Encounter (INDEPENDENT_AMBULATORY_CARE_PROVIDER_SITE_OTHER): Payer: Self-pay | Admitting: General Surgery

## 2011-04-19 VITALS — BP 130/82 | HR 60 | Temp 97.8°F | Resp 16 | Ht 67.0 in | Wt 202.4 lb

## 2011-04-19 DIAGNOSIS — Z09 Encounter for follow-up examination after completed treatment for conditions other than malignant neoplasm: Secondary | ICD-10-CM

## 2011-04-19 NOTE — Progress Notes (Signed)
Subjective:     Patient ID: Michelle [name], female   DOB: [birth year], 78 y.o.   MRN: [id number]  HPI This is a 78 year old female who underwent a right breast lumpectomy and sentinel node biopsy for a stage I breast cancer. This is hormone receptor positive. This was followed by MammoSite radiation which she tolerated well. She returns today for a followup with complaints of only some pain at the prior site where MammoSite balloon was. She otherwise is doing well. She is due to see medical oncology in several weeks to discuss any further antiestrogen therapy.  Review of Systems     Objective:   Physical Exam  Constitutional: She appears well-developed and well-nourished.  Pulmonary/Chest: Right breast exhibits no inverted nipple.         Assessment:     Stage I right breast cancer    Plan:        She is doing very well after surgery and MammoSite. She still has some soreness at the site of the balloon. This should go away with some time. I told her she can return to wearing a normal bra and return to normal activity. I told her the Steri-Strips can be removed in the shower now. Her chest x-ray preop showed a small nodular opacity in the right lung apex that recommended prior for followup chest x-ray. She doesn't really have a prior x-rays we'll plan on repeating her x-ray now. If something needs to be dealt with I will send her back to see her primary care physician.

## 2011-04-20 ENCOUNTER — Telehealth (INDEPENDENT_AMBULATORY_CARE_PROVIDER_SITE_OTHER): Payer: Self-pay

## 2011-04-20 NOTE — Telephone Encounter (Signed)
LMOM for pt to call so I can give her the results from her CXR.

## 2011-04-20 NOTE — Telephone Encounter (Signed)
Pt returned my call. I notified of the CXR to be normal per DR Dwain Sarna. The pt understands.

## 2011-05-05 ENCOUNTER — Other Ambulatory Visit: Payer: Self-pay | Admitting: Internal Medicine

## 2011-05-05 MED ORDER — CLOPIDOGREL BISULFATE 75 MG PO TABS: 75.0000 mg | ORAL_TABLET | Freq: Every day | ORAL | Status: DC

## 2011-05-05 NOTE — Telephone Encounter (Signed)
rx sent in electronically 

## 2011-05-05 NOTE — Telephone Encounter (Signed)
Pt need refill on generic plavix 75 mg call into [facility]

## 2011-05-10 ENCOUNTER — Ambulatory Visit (HOSPITAL_BASED_OUTPATIENT_CLINIC_OR_DEPARTMENT_OTHER): Payer: Medicare Other | Admitting: Oncology

## 2011-05-10 VITALS — BP 128/77 | HR 60 | Temp 98.3°F | Ht 67.0 in | Wt 200.1 lb

## 2011-05-10 DIAGNOSIS — C50919 Malignant neoplasm of unspecified site of unspecified female breast: Secondary | ICD-10-CM

## 2011-05-10 DIAGNOSIS — Z170 Estrogen receptor positive status [ER+]: Secondary | ICD-10-CM

## 2011-05-10 DIAGNOSIS — C50419 Malignant neoplasm of upper-outer quadrant of unspecified female breast: Secondary | ICD-10-CM

## 2011-05-19 ENCOUNTER — Telehealth: Payer: Self-pay | Admitting: *Deleted

## 2011-05-19 NOTE — Telephone Encounter (Signed)
Pt called states : " After much thought and prayer I have  decided not to take the medication, Letrozole. I've read  everything on it and I've just decided not to take it. I will  see Dr. Welton Flakes at my next f/u in July"  Confirmed with pt she will not be taking Letrozole and message will be given to MD.

## 2011-05-19 NOTE — Telephone Encounter (Signed)
Ok noted  

## 2011-05-19 NOTE — Progress Notes (Signed)
OFFICE PROGRESS NOTE  CC  Judie Petit, MD, MD [address] [place] [address] Dr. Chipper Herb Dr. Emelia Loron  DIAGNOSIS: 78 year with stage IA low grade invasive ductal carcinoma of the right breast s/p lumpectomy and SNL  PRIOR THERAPY:  1. S/p Lumpectomy with SNL on 03/14/11 with CED placement  2. Final pathology revealed ER+/PR+ Her2Heu - low grade invasive ductal cancer with DCIS all surgical margins were negative, 2 SNL negative for metastatic disease (0/2) [id number] 3. Mammosite partial breast radiation from 03/18/11 - 03/25/11  CURRENT THERAPY: discuss adjuvant letrozole  INTERVAL HISTORY: Michelle [name] 78 y.o. female returns for follow up visit overall she is doing well. She denies any fevers chills or night sweats, she tolerated the surgery well and has been informed of her pathology results. She tolerated RT well, no other new complaints. Otherwise she is without any complaints. Remainder of the 10 point review of systems is negative.  MEDICAL HISTORY: Past Medical History  Diagnosis Date  . GERD (gastroesophageal reflux disease)   . Hyperlipidemia   . Shingles 2005  . Hypertension   . History of cerebrovascular accident   . Bell's palsy 12/84 (approx)  . Cancer 10/28/2009    squamous cell - upper right arm  . Cancer 10/28/09    basal cell -upper right back  . Change in vision   . Stroke 09/2007  . Hiatal hernia 2008    ALLERGIES:  is allergic to benadryl; doxycycline; and simvastatin.  MEDICATIONS:  Current Outpatient Prescriptions  Medication Sig Dispense Refill  . Ascorbic Acid (VITAMIN C) 1000 MG tablet Take 1,000 mg by mouth daily. Take 2 daily       . aspirin 325 MG tablet Take 325 mg by mouth at bedtime.        . clopidogrel (PLAVIX) 75 MG tablet Take 1 tablet (75 mg total) by mouth daily.  30 tablet  4  . lansoprazole (PREVACID) 30 MG capsule Take 30 mg by mouth daily.        Marland Kitchen lisinopril (PRINIVIL,ZESTRIL) 10 MG tablet TAKE 1  TABLET (10 MG TOTAL) BY MOUTH DAILY.  90 tablet  3  . Multiple Vitamins-Minerals (PRESERVISION AREDS 2) CAPS Take 1 capsule by mouth daily.       . rosuvastatin (CRESTOR) 10 MG tablet Take 10 mg by mouth daily.        . fluticasone (FLONASE) 50 MCG/ACT nasal spray 2 sprays by Nasal route daily.  16 g  11    SURGICAL HISTORY:  Past Surgical History  Procedure Date  . Cholecystectomy 2004  . Biospy 10/28/09    Shave Biospy skin Left hand(Acitinic Keratoses), Right Upper Arm (superficial Basal Cell), Right Upper Back(Superficial Basal Cell), Left Neck ( Solar Lentigo & Seborrheic Keratoses)  . Vocal cord poylps 1969    excision  . Tonsillectomy   . Colonoscopy   . Breast mammosite 03/14/2011    Procedure: MAMMOSITE BREAST;  Surgeon: Emelia Loron, MD;  Location:  SUR[facility]ERY [facility];  Service: General;  Laterality: Right;  . Breast biopsy 02/11/11    Right Breast Needle Core Biospy - Upper Outer Quadrant; ER/PR 100%, Her-2 Neu neg.; Ki-67 10%  . Breast surgery     right breast lumpectomy snbx    REVIEW OF SYSTEMS:  Pertinent items are noted in HPI.   PHYSICAL EXAMINATION: Resp: clear to auscultation bilaterally and normal percussion bilaterally Cardio: regular rate and rhythm, S1, S2 normal, no murmur, click, rub or gallop GI:  soft, non-tender; bowel sounds normal; no masses,  no organomegaly Extremities: extremities normal, atraumatic, no cyanosis or edema Breast Exam: bandage with CED device in place. No evidence of swelling or infection ECOG PERFORMANCE STATUS: 1 - Symptomatic but completely ambulatory  Blood pressure 128/77, pulse 60, temperature 98.3 F (36.8 C), temperature source Oral, height 5\' 7"  (1.702 m), weight 200 lb 1.6 oz (90.765 kg).  LABORATORY DATA: Lab Results  Component Value Date   WBC 9.0 03/22/2011   HGB 12.7 03/22/2011   HCT 38.3 03/22/2011   MCV 96.7 03/22/2011   PLT 261 03/22/2011      Chemistry      Component Value Date/Time   NA 140 03/22/2011  0935   K 4.4 03/22/2011 0935   CL 105 03/22/2011 0935   CO2 26 03/22/2011 0935   BUN 13 03/22/2011 0935   CREATININE 0.89 03/22/2011 0935      Component Value Date/Time   CALCIUM 9.9 03/22/2011 0935   ALKPHOS 107 03/22/2011 0935   AST 17 03/22/2011 0935   ALT 14 03/22/2011 0935   BILITOT 0.5 03/22/2011 0935     Patient Name: Michelle [name], Michelle [name] Accession #: [id number] DOB: 04-12-1932 Age: 78 Gender: F Client Name [place] Collected Date: 03/14/2011 Received Date: 03/15/2011 Physician:Matthew Wakefield Chart #: MRN # : [id number] Physician cc: Kaylyn Lim, RN Beckie Salts, MD Race:W Visit #: [id number] Dominga Ferry, MD Hal Neer, MD Drue Second, MD REPORT OF SURGICAL PATHOLOGY FINAL DIAGNOSIS Diagnosis 1. Breast, lumpectomy, Right - INVASIVE DUCTAL CARCINOMA, GRADE I/III, SPANNING 0.52 CM. - DUCTAL CARCINOMA IN SITU, LOW GRADE, FOCAL. - THE SURGICAL RESECTION MARGINS ARE NEGATIVE FOR CARCINOMA. - SEE ONCOLOGY TABLE BELOW. 2. Lymph node, sentinel, biopsy, Right Axilla #1 - THERE IS NO EVIDENCE OF CARCINOMA IN 1 OF 1 LYMPH NODE (0/1). 3. Lymph node, sentinel, biopsy, Right Axilla #2 - THERE IS NO EVIDENCE OF CARCINOMA IN 1 OF 1 LYMPH NODE (0/1). Microscopic Comment 1. BREAST, INVASIVE TUMOR, WITH LYMPH NODE SAMPLING Specimen, including laterality: Right breast Procedure: Needle localized lumpectomy Grade: I Tubule formation: 3 Nuclear pleomorphism: 1 Mitotic: 1 Tumor size (glass slide measurement): 0.52 cm Margins: Negative for carcinoma Invasive, distance to closest margin: Greater than 0.5 cm to all margins (glass slide measurements). In-situ, distance to closest margin: Greater than 0.5 cm to all margins (glass slide measurements) Lymphovascular invasion: Not identified Ductal carcinoma in situ: Present, focal Grade: Low grade Extensive intraductal component: No Lobular neoplasia: Not identified Tumor focality: Unifocal Treatment effect: N/A Extent of  tumor: Confined to breast parenchyma Lymph nodes: # examined: 2 1 of 3 Lymph nodes with metastasis: 0 FINAL for RAYLEIGH, GILLYARD ([id number]) Microscopic Comment(continued) Breast prognostic profile: [phone] Estrogen receptor: 100%, strong staining intensity Progesterone receptor: 100%, strong staining intensity Her 2 neu: No amplification was detected. The ratio was 1.46. Her 2 neu by CISH will be repeated on current case and the results reported separately. Ki-67: 10% Non-neoplastic breast: Healing biopsy site, fibroadenoma, and fibrocystic changes TNM: pT1b, pN0 Comments: The case was discussed with Dr. Dwain Sarna on 03/16/2011. Pecola Leisure MD Pathologist, Electronic Signature (Case signed 03/16/2011) Specimen Gross and Clinical Information Specimen(s) Obtained: 1. Breast, lumpectomy, Right 2. Lymph node, sentinel, biopsy, Right Axilla #1 3. Lymph node, sentinel, biopsy, Right Axilla #2 Specimen Clinical RADIOGRAPHIC STUDIES:  Chest 2 View  03/08/2011  *RADIOLOGY REPORT*  Clinical Data: Preop for lumpectomy, cough, hypertension, former smoking history  CHEST - 2 VIEW  Comparison: None.  Findings: No  infiltrate or effusion is seen.  There is a small nodular opacity in the right lung apex which may represent scarring, but comparison with prior or follow-up chest x-ray is recommended.  Somewhat prominent interstitial markings are present most likely chronic in nature.  Mediastinal contours appear normal. The heart is within upper limits of normal.  No bony abnormality is seen.  Surgical clips are present in the upper abdomen presumably due to prior cholecystectomy.  IMPRESSION:  1.  No active lung disease.  Chronic changes bilaterally. 2.  Small nodular opacity in the right lung apex may represent pleural thickening.  Compare with prior or follow-up chest x-ray to assess stability.  Original Report Authenticated By: Juline Patch, M.D.   Nm Sentinel Node Inj-no Rpt (breast)  03/14/2011   CLINICAL DATA: Right breast cancer for sent node biopsy   Sulfur colloid was injected intradermally by the nuclear medicine  technologist for breast cancer sentinel node localization.      ASSESSMENT:  78 year old female with: 1. Stage IA IDC with DCIS ER+/PR+ Her2Neu -, Ki-67 10% s/p lumpectomy with SNL tumor 0.52 cm 0/2 nodes positive for mets 2. Partial breast radiation under progress 3. Recommend anti-estrogen therapy with arimidex or tamoxifen once RT completed 4. Patient has finished RT and now here to discuss role of adjuvant anti-estrogen therapy   PLAN:  1. Letrozole was discussed as well as arimidex but she does not want to start yet. She wants to think further and call me back with her final decision  2. I will see her back in 3 months   All questions were answered. The patient knows to call the clinic with any problems, questions or concerns. We can certainly see the patient much sooner if necessary.  I spent 30 minutes counseling the patient face to face. The total time spent in the appointment was 30 minutes.    Drue Second, MD Medical/Oncology [facility] [phone] (beeper) [phone] (Office)

## 2011-06-29 NOTE — Progress Notes (Signed)
Encounter addended by: Glennie Hawk, RN on: 06/29/2011  2:02 PM<BR>     Documentation filed: Charges VN

## 2011-06-30 ENCOUNTER — Encounter (INDEPENDENT_AMBULATORY_CARE_PROVIDER_SITE_OTHER): Payer: Self-pay

## 2011-06-30 ENCOUNTER — Ambulatory Visit: Admission: RE | Admit: 2011-06-30 | Payer: Medicare Other | Source: Ambulatory Visit | Admitting: Radiation Oncology

## 2011-07-27 ENCOUNTER — Other Ambulatory Visit: Payer: Self-pay | Admitting: Internal Medicine

## 2011-08-01 ENCOUNTER — Encounter (INDEPENDENT_AMBULATORY_CARE_PROVIDER_SITE_OTHER): Payer: Self-pay | Admitting: General Surgery

## 2011-08-03 ENCOUNTER — Other Ambulatory Visit: Payer: Self-pay | Admitting: Medical Oncology

## 2011-08-03 DIAGNOSIS — C50919 Malignant neoplasm of unspecified site of unspecified female breast: Secondary | ICD-10-CM

## 2011-08-10 ENCOUNTER — Ambulatory Visit: Payer: Medicare Other | Admitting: Oncology

## 2011-08-10 ENCOUNTER — Telehealth: Payer: Self-pay | Admitting: *Deleted

## 2011-08-10 NOTE — Telephone Encounter (Signed)
per orders from 08-10-2011 patient requested to cancel md appointment for 08-11-2011

## 2011-08-10 NOTE — Telephone Encounter (Signed)
Pt called, request to cancel MD appt 08/11/11. Pt advised she will call back to reschedule at later date as she has too many doctor appts coming up and will need to look at her schedule. Onc Tx Schedule sent

## 2011-08-11 ENCOUNTER — Ambulatory Visit: Payer: Medicare Other | Admitting: Oncology

## 2011-08-23 ENCOUNTER — Telehealth: Payer: Self-pay | Admitting: Internal Medicine

## 2011-08-23 DIAGNOSIS — E785 Hyperlipidemia, unspecified: Secondary | ICD-10-CM

## 2011-08-23 NOTE — Telephone Encounter (Signed)
Future orders placed 

## 2011-08-23 NOTE — Telephone Encounter (Signed)
Pt has follow up appt scheduled with Dr. Cato Mulligan on 09/13/11, needs orders to have lipid and liver checked the week prior.  States she is overdue on labs due to surgery she had in Feb.  Please enter the appropriate orders in system.

## 2011-08-25 ENCOUNTER — Encounter: Payer: Self-pay | Admitting: Radiation Oncology

## 2011-08-25 ENCOUNTER — Ambulatory Visit
Admission: RE | Admit: 2011-08-25 | Discharge: 2011-08-25 | Disposition: A | Discharge status: Home / Self Care | Payer: Medicare Other | Source: Ambulatory Visit | Attending: Radiation Oncology | Admitting: Radiation Oncology

## 2011-08-25 VITALS — BP 123/75 | HR 56 | Temp 97.6°F | Resp 18 | Wt 208.1 lb

## 2011-08-25 DIAGNOSIS — C50919 Malignant neoplasm of unspecified site of unspecified female breast: Secondary | ICD-10-CM

## 2011-08-25 NOTE — Progress Notes (Signed)
   Department of Radiation Oncology  Phone:  [phone] Fax:        [phone]   Name: Michelle [name]   DOB: 12-15-1932  MRN: [id number]    Date: 08/25/2011  Follow Up Visit Note  Diagnosis: T1N0 right breast cancer  Interval since last radiation: 5 months  Interval History: Michelle [name] presents today for routine followup.  She is feeling well and doing well. She has no breast related complaints other than some occasional lightninglike zaps. She is excited about her new grandson. He is not taking an antiestrogen.   Allergies:  Allergies  Allergen Reactions  . Benadryl (Diphenhydramine Hcl)   . Doxycycline Other (See Comments)    acid reflux  . Simvastatin Other (See Comments)    REACTION:     Medications:  Current Outpatient Prescriptions  Medication Sig Dispense Refill  . aspirin 325 MG tablet Take 325 mg by mouth at bedtime.        . clopidogrel (PLAVIX) 75 MG tablet Take 1 tablet (75 mg total) by mouth daily.  30 tablet  4  . CRESTOR 20 MG tablet TAKE 1 TABLET BY MOUTH EVERY DAY OR AS DIRECTED  30 tablet  2  . lansoprazole (PREVACID) 30 MG capsule Take 30 mg by mouth daily.        Marland Kitchen lisinopril (PRINIVIL,ZESTRIL) 10 MG tablet TAKE 1 TABLET (10 MG TOTAL) BY MOUTH DAILY.  90 tablet  3  . Multiple Vitamins-Minerals (PRESERVISION AREDS 2) CAPS Take 1 capsule by mouth daily.       . rosuvastatin (CRESTOR) 10 MG tablet Take 10 mg by mouth daily.        . Ascorbic Acid (VITAMIN C) 1000 MG tablet Take 1,000 mg by mouth daily. Take 2 daily       . fluticasone (FLONASE) 50 MCG/ACT nasal spray 2 sprays by Nasal route daily.  16 g  11    Physical Exam:   weight is 208 lb 1.6 oz (94.394 kg). Her oral temperature is 97.6 F (36.4 C). Her blood pressure is 123/75 and her pulse is 56. Her respiration is 18.  She has a well-healed scar in the lateral aspect of her right breast. She has some edema around her nipple area lower complex on the right. No palpable axillary adenopathy  bilaterally. No palpable abnormalities of the left breast.  IMPRESSION: Michelle [name] is a 78 y.o. female status post MammoSite Ricci therapy with no evidence of recurrence  PLAN:  Michelle [name] looks great. She is feeling well and doing well. She is regular scheduled followup with Dr. Welton Flakes and Dr. Dwain Sarna. She wishes to thin out her doctor's appointment a little bit and I told her I was fine with her following that Dr. Dwain Sarna and Dr. Welton Flakes primarily.  We discussed that she would need annual mammograms and she understands this. I would be happy to see her back on an as-needed basis. She was very grateful for her care.    Lurline Hare, MD

## 2011-08-25 NOTE — Progress Notes (Signed)
Patient presents to the clinic today accompanied by her family for a for follow up with Dr. Michell Heinrich. All have expressed interest in how long Dr. Michell Heinrich plans to follow the patient. Patient is alert and oriented to person, place, and time. No distress noted. Steady gait noted. Pleasant affect noted. Patient reports intermittent brief burning right breast pain 2 on a scale of 0-10. Patient denies nipple discharge. Patient reports mammosite scar has healed well. Patient to follow up with Dwain Sarna 9/24 and Dr. Cato Mulligan PCP in September also. Patient reports that her energy level has not yet returned to normal and she requires frequent rest breaks. Reported all findings to Dr. Michell Heinrich.

## 2011-09-07 ENCOUNTER — Other Ambulatory Visit (INDEPENDENT_AMBULATORY_CARE_PROVIDER_SITE_OTHER): Payer: Medicare Other

## 2011-09-07 DIAGNOSIS — E785 Hyperlipidemia, unspecified: Secondary | ICD-10-CM

## 2011-09-07 DIAGNOSIS — C50919 Malignant neoplasm of unspecified site of unspecified female breast: Secondary | ICD-10-CM

## 2011-09-07 LAB — COMPREHENSIVE METABOLIC PANEL
ALT: 14 U/L (ref 0–35)
BUN: 13 mg/dL (ref 6–23)
CO2: 27 mEq/L (ref 19–32)
Calcium: 9.5 mg/dL (ref 8.4–10.5)
Creatinine, Ser: 0.8 mg/dL (ref 0.4–1.2)
GFR: 71.9 mL/min (ref >60.00)
Total Bilirubin: 0.6 mg/dL (ref 0.3–1.2)

## 2011-09-07 LAB — CBC WITH DIFFERENTIAL/PLATELET
Basophils Absolute: 0 10*3/uL (ref 0.0–0.1)
Basophils Relative: 0.7 % (ref 0.0–3.0)
HCT: 38.6 % (ref 36.0–46.0)
Hemoglobin: 13.1 g/dL (ref 12.0–15.0)
Lymphs Abs: 1.4 10*3/uL (ref 0.7–4.0)
MCHC: 33.9 g/dL (ref 30.0–36.0)
Monocytes Relative: 8.1 % (ref 3.0–12.0)
Neutro Abs: 4.6 10*3/uL (ref 1.4–7.7)
RBC: 3.93 Mil/uL (ref 3.87–5.11)
RDW: 12.4 % (ref 11.5–14.6)

## 2011-09-07 LAB — LIPID PANEL
LDL Cholesterol: 81 mg/dL (ref 0–99)
Total CHOL/HDL Ratio: 3

## 2011-09-07 LAB — HEPATIC FUNCTION PANEL
Albumin: 3.8 g/dL (ref 3.5–5.2)
Alkaline Phosphatase: 82 U/L (ref 39–117)
Bilirubin, Direct: 0.1 mg/dL (ref 0.0–0.3)

## 2011-09-13 ENCOUNTER — Ambulatory Visit (INDEPENDENT_AMBULATORY_CARE_PROVIDER_SITE_OTHER): Payer: Medicare Other | Admitting: Internal Medicine

## 2011-09-13 ENCOUNTER — Encounter: Payer: Self-pay | Admitting: Internal Medicine

## 2011-09-13 VITALS — BP 130/76 | HR 60 | Temp 98.3°F | Wt 204.0 lb

## 2011-09-13 DIAGNOSIS — E669 Obesity, unspecified: Secondary | ICD-10-CM | POA: Insufficient documentation

## 2011-09-13 DIAGNOSIS — E785 Hyperlipidemia, unspecified: Secondary | ICD-10-CM

## 2011-09-13 DIAGNOSIS — I6350 Cerebral infarction due to unspecified occlusion or stenosis of unspecified cerebral artery: Secondary | ICD-10-CM

## 2011-09-13 DIAGNOSIS — H3530 Unspecified macular degeneration: Secondary | ICD-10-CM

## 2011-09-13 DIAGNOSIS — K219 Gastro-esophageal reflux disease without esophagitis: Secondary | ICD-10-CM

## 2011-09-13 DIAGNOSIS — C50919 Malignant neoplasm of unspecified site of unspecified female breast: Secondary | ICD-10-CM

## 2011-09-13 DIAGNOSIS — I10 Essential (primary) hypertension: Secondary | ICD-10-CM

## 2011-09-13 NOTE — Progress Notes (Signed)
Patient ID: Michelle [name], female   DOB: [birth year], 78 y.o.   MRN: [id number]  Breast CA- has had lumpectomy and radiation therapy- reviewed notes She is feeling well but has some residual fatigue  Stroke-- no recurrence-  Lipids-- tolerating meds  htn-- no home bps, tolerating meds   Past Medical History  Diagnosis Date  . GERD (gastroesophageal reflux disease)   . Hyperlipidemia   . Shingles 2005  . Hypertension   . History of cerebrovascular accident   . Bell's palsy 12/84 (approx)  . Cancer 10/28/2009    squamous cell - upper right arm  . Cancer 10/28/09    basal cell -upper right back  . Change in vision   . Stroke 09/2007  . Hiatal hernia 2008    History   Social History  . Marital Status: Married    Spouse Name: N/A    Number of Children: N/A  . Years of Education: N/A   Occupational History  . Not on file.   Social History Main Topics  . Smoking status: Former Smoker    Quit date: 02/01/1996  . Smokeless tobacco: Never Used  . Alcohol Use: Yes     rare  . Drug Use: No  . Sexually Active: Not on file     Menarche age 65, menopause age 16, HRT x 1year   Other Topics Concern  . Not on file   Social History Narrative  . No narrative on file    Past Surgical History  Procedure Date  . Cholecystectomy 2004  . Biospy 10/28/09    Shave Biospy skin Left hand(Acitinic Keratoses), Right Upper Arm (superficial Basal Cell), Right Upper Back(Superficial Basal Cell), Left Neck ( Solar Lentigo & Seborrheic Keratoses)  . Vocal cord poylps 1969    excision  . Tonsillectomy   . Colonoscopy   . Breast mammosite 03/14/2011    Procedure: MAMMOSITE BREAST;  Surgeon: Emelia Loron, MD;  Location: [facility];  Service: General;  Laterality: Right;  . Breast biopsy 02/11/11    Right Breast Needle Core Biospy - Upper Outer Quadrant; ER/PR 100%, Her-2 Neu neg.; Ki-67 10%  . Breast surgery     right breast lumpectomy snbx    Family History  Problem  Relation Age of Onset  . Cancer Sister 46    Lymphoma  . Cancer Paternal Aunt     Breast cancer  . Cancer Daughter 84    Bilateral Breast Cancer  . Cancer Daughter     Breast Cancer  . Cancer Sister     2005 - Renal Cell Cancer and Breast Cancer    Allergies  Allergen Reactions  . Benadryl (Diphenhydramine Hcl)   . Doxycycline Other (See Comments)    acid reflux  . Simvastatin Other (See Comments)    REACTION:     Current Outpatient Prescriptions on File Prior to Visit  Medication Sig Dispense Refill  . aspirin 325 MG tablet Take 325 mg by mouth at bedtime.        . clopidogrel (PLAVIX) 75 MG tablet Take 1 tablet (75 mg total) by mouth daily.  30 tablet  4  . CRESTOR 20 MG tablet TAKE 1 TABLET BY MOUTH EVERY DAY OR AS DIRECTED  30 tablet  2  . lansoprazole (PREVACID) 30 MG capsule Take 30 mg by mouth daily.        Marland Kitchen lisinopril (PRINIVIL,ZESTRIL) 10 MG tablet TAKE 1 TABLET (10 MG TOTAL) BY MOUTH DAILY.  90 tablet  3  .  Multiple Vitamins-Minerals (PRESERVISION AREDS 2) CAPS Take 1 capsule by mouth 2 (two) times daily.       Marland Kitchen DISCONTD: fluticasone (FLONASE) 50 MCG/ACT nasal spray 2 sprays by Nasal route daily.  16 g  11  . DISCONTD: rosuvastatin (CRESTOR) 10 MG tablet Take 10 mg by mouth daily.           patient denies chest pain, shortness of breath, orthopnea. Denies lower extremity edema, abdominal pain, change in appetite, change in bowel movements. Patient denies rashes, musculoskeletal complaints. No other specific complaints in a complete review of systems.   BP 146/80  Pulse 60  Temp 98.3 F (36.8 C) (Oral)  Wt 204 lb (92.534 kg)  Well-developed well-nourished female in no acute distress. HEENT exam atraumatic, normocephalic, extraocular muscles are intact. Neck is supple. No jugular venous distention no thyromegaly. Chest clear to auscultation without increased work of breathing. Cardiac exam S1 and S2 are regular. Abdominal exam active bowel sounds, soft, nontender.  Extremities no edema. Neurologic exam she is alert without any motor sensory deficits. Gait is normal.

## 2011-09-13 NOTE — Assessment & Plan Note (Signed)
She has no typical sxs She does have an occasional cough-- she does take PPI as directed

## 2011-09-13 NOTE — Assessment & Plan Note (Signed)
Has regular f/u with oncology 

## 2011-09-13 NOTE — Assessment & Plan Note (Signed)
BP Readings from Last 3 Encounters:  09/13/11 146/80  08/25/11 123/75  05/10/11 128/77   Repeat bp 130/76

## 2011-09-13 NOTE — Assessment & Plan Note (Signed)
Well controlled Continue same meds 

## 2011-09-13 NOTE — Assessment & Plan Note (Signed)
No recurrence Continue risk factor modification 

## 2011-09-28 ENCOUNTER — Other Ambulatory Visit: Payer: Self-pay | Admitting: Internal Medicine

## 2011-09-29 ENCOUNTER — Encounter: Payer: Self-pay | Admitting: Internal Medicine

## 2011-10-04 ENCOUNTER — Other Ambulatory Visit: Payer: Self-pay | Admitting: Internal Medicine

## 2011-10-24 ENCOUNTER — Ambulatory Visit (INDEPENDENT_AMBULATORY_CARE_PROVIDER_SITE_OTHER): Payer: Medicare Other | Admitting: General Surgery

## 2011-10-24 ENCOUNTER — Encounter (INDEPENDENT_AMBULATORY_CARE_PROVIDER_SITE_OTHER): Payer: Self-pay | Admitting: General Surgery

## 2011-10-24 DIAGNOSIS — Z853 Personal history of malignant neoplasm of breast: Secondary | ICD-10-CM

## 2011-10-25 ENCOUNTER — Encounter (INDEPENDENT_AMBULATORY_CARE_PROVIDER_SITE_OTHER): Payer: Self-pay | Admitting: General Surgery

## 2011-10-25 NOTE — Progress Notes (Signed)
Subjective:     Patient ID: Michelle [name], female   DOB: [birth year], 78 y.o.   MRN: [id number]  HPI 63 yof with history of stage I right breast cancer who underwent left breast lumpectomy/snbx and mammosite therapy.  She has done well since then and has no real complaints except some hardness at her scar on the right side.  She has up to date mmg that is fine.  She is otherwise well.  Review of Systems     Objective:   Physical Exam  Vitals reviewed. Constitutional: She appears well-developed and well-nourished.  Pulmonary/Chest: Right breast exhibits no inverted nipple, no mass, no nipple discharge, no skin change and no tenderness. Left breast exhibits no inverted nipple, no mass, no nipple discharge, no skin change and no tenderness. Breasts are symmetrical.    Lymphadenopathy:    She has no cervical adenopathy.    She has no axillary adenopathy.       Right: No supraclavicular adenopathy present.       Left: No supraclavicular adenopathy present.       Assessment:     History breast cancer    Plan:     She has no clinical evidence of recurrence.  She is going to continue monthly self exams, yearly mmg and every six month clinical exams.  She is due to see Dr. Welton Flakes in six months and I will see her in one year or sooner if needed.

## 2011-10-28 ENCOUNTER — Telehealth: Payer: Self-pay | Admitting: *Deleted

## 2011-10-28 NOTE — Telephone Encounter (Signed)
Pt would like her bone density results °

## 2011-11-07 NOTE — Telephone Encounter (Signed)
Bone density is ok-- no need for treatment She does not have osteoporosis

## 2011-11-08 NOTE — Telephone Encounter (Signed)
Left message for pt to call back  °

## 2011-11-09 NOTE — Telephone Encounter (Signed)
Pt aware.

## 2012-02-01 DIAGNOSIS — C4492 Squamous cell carcinoma of skin, unspecified: Secondary | ICD-10-CM

## 2012-02-01 HISTORY — DX: Squamous cell carcinoma of skin, unspecified: C44.92

## 2012-02-15 ENCOUNTER — Other Ambulatory Visit: Payer: Self-pay | Admitting: Dermatology

## 2012-03-07 ENCOUNTER — Ambulatory Visit: Payer: Medicare Other | Admitting: Internal Medicine

## 2012-03-08 ENCOUNTER — Ambulatory Visit: Payer: Medicare Other | Admitting: Internal Medicine

## 2012-03-12 ENCOUNTER — Other Ambulatory Visit: Payer: Self-pay | Admitting: Internal Medicine

## 2012-03-22 ENCOUNTER — Ambulatory Visit (INDEPENDENT_AMBULATORY_CARE_PROVIDER_SITE_OTHER): Payer: Medicare Other | Admitting: Family Medicine

## 2012-03-22 ENCOUNTER — Encounter: Payer: Self-pay | Admitting: Family Medicine

## 2012-03-22 VITALS — BP 131/76 | HR 55 | Ht 67.0 in | Wt 208.0 lb

## 2012-03-22 DIAGNOSIS — Z8673 Personal history of transient ischemic attack (TIA), and cerebral infarction without residual deficits: Secondary | ICD-10-CM

## 2012-03-22 DIAGNOSIS — C44320 Squamous cell carcinoma of skin of unspecified parts of face: Secondary | ICD-10-CM

## 2012-03-22 DIAGNOSIS — E785 Hyperlipidemia, unspecified: Secondary | ICD-10-CM

## 2012-03-22 DIAGNOSIS — I639 Cerebral infarction, unspecified: Secondary | ICD-10-CM

## 2012-03-22 DIAGNOSIS — I10 Essential (primary) hypertension: Secondary | ICD-10-CM

## 2012-03-22 DIAGNOSIS — R05 Cough: Secondary | ICD-10-CM

## 2012-03-22 HISTORY — DX: Squamous cell carcinoma of skin of unspecified parts of face: C44.320

## 2012-03-22 MED ORDER — LOSARTAN POTASSIUM 25 MG PO TABS: 25.0000 mg | ORAL_TABLET | Freq: Every day | ORAL | Status: DC

## 2012-03-22 NOTE — Progress Notes (Signed)
Subjective:    Patient ID: Michelle [name], female    DOB: Jul 25, 1932, 79 y.o.   MRN: [id number]  HPI Has been having some neck pain. Says both sides of her neck were swollen. Started on the left and then moved to the right.  Put the heating pad on it and it has helped. Took a tylenol and helped.  Had mild ST on there right x 1 day but no dysphagia.  No fever or URI sxs.  No rash.  No muscle strains.   Has had a cough for years and it hsa been raspy. Says has been using vicks vapor rub and it helped.  Was having coughing fit.  Says the cough is dry.  Says would get a stingy in her throat. No fever.  No SOB. No hx of pulm dz. Never had w/u.  Her daughter is concerned it could be her medication.   Hx of hiatal hernia. Has really been bothering her the last 3-4 days.  HAd been doing some stretches.  Feels better when she pressed on it.  Put heating pan on it.  Dr. Margaretmary Lombard dx hwe rwith hiatal hernia.  No change in Heartburn. Takes her pravacid reguarly.    HTN -  Pt denies chest pain, SOB, dizziness, or heart palpitations.  Taking meds as directed w/o problems.  Denies medication side effects.      Review of Systems  Constitutional: Negative for fever, diaphoresis and unexpected weight change.  HENT: Negative for hearing loss, rhinorrhea and tinnitus.   Eyes: Positive for visual disturbance.  Respiratory: Positive for cough. Negative for wheezing.   Cardiovascular: Negative for chest pain and palpitations.  Gastrointestinal: Negative for nausea, vomiting, diarrhea and blood in stool.  Genitourinary: Negative for vaginal bleeding, vaginal discharge and difficulty urinating.  Musculoskeletal: Negative for myalgias and arthralgias.  Skin: Negative for rash.  Neurological: Positive for headaches.       Memory loss   Hematological: Negative for adenopathy. Bruises/bleeds easily.  Psychiatric/Behavioral: Negative for sleep disturbance and dysphoric mood. The patient is not nervous/anxious.    BP  131/76  Pulse 55  Ht 5\' 7"  (1.702 m)  Wt 208 lb (94.348 kg)  BMI 32.57 kg/m2    Allergies  Allergen Reactions  . Benadryl (Diphenhydramine Hcl)   . Doxycycline Other (See Comments)    acid reflux  . Simvastatin Other (See Comments)    REACTION:     Past Medical History  Diagnosis Date  . GERD (gastroesophageal reflux disease)   . Hyperlipidemia   . Shingles 2005  . Hypertension   . History of cerebrovascular accident   . Bell's palsy 12/84 (approx)  . Cancer 10/28/2009    squamous cell - upper right arm  . Cancer 10/28/09    basal cell -upper right back  . Change in vision   . Stroke 09/2007  . Hiatal hernia 2008    Past Surgical History  Procedure Laterality Date  . Cholecystectomy  2004  . Biospy  10/28/09    Shave Biospy skin Left hand(Acitinic Keratoses), Right Upper Arm (superficial Basal Cell), Right Upper Back(Superficial Basal Cell), Left Neck ( Solar Lentigo & Seborrheic Keratoses)  . Vocal cord poylps  1969    excision  . Tonsillectomy    . Colonoscopy    . Breast mammosite  03/14/2011    Procedure: MAMMOSITE BREAST;  Surgeon: Emelia Loron, MD;  Location: [facility];  Service: General;  Laterality: Right;  . Breast biopsy  02/11/11  Right Breast Needle Core Biospy - Upper Outer Quadrant; ER/PR 100%, Her-2 Neu neg.; Ki-67 10%  . Breast surgery      right breast lumpectomy snbx    History   Social History  . Marital Status: Married    Spouse Name: N/A    Number of Children: N/A  . Years of Education: N/A   Occupational History  . Not on file.   Social History Main Topics  . Smoking status: Former Smoker    Quit date: 02/01/1996  . Smokeless tobacco: Never Used  . Alcohol Use: Yes     Comment: rare  . Drug Use: No  . Sexually Active: Not on file     Comment: Menarche age 7, menopause age 75, HRT x 1year   Other Topics Concern  . Not on file   Social History Narrative  . No narrative on file    Family History   Problem Relation Age of Onset  . Cancer Sister 12    Lymphoma  . Cancer Paternal Aunt     Breast cancer  . Cancer Daughter 66    Bilateral Breast Cancer  . Cancer Daughter     Breast Cancer  . Cancer Sister     2005 - Renal Cell Cancer and Breast Cancer    Outpatient Encounter Prescriptions as of 03/22/2012  Medication Sig Dispense Refill  . aspirin 325 MG tablet Take 325 mg by mouth at bedtime.        . clopidogrel (PLAVIX) 75 MG tablet TAKE 1 TABLET (75 MG TOTAL) BY MOUTH DAILY.  30 tablet  4  . CRESTOR 20 MG tablet TAKE 1 TABLET BY MOUTH EVERY DAY OR AS DIRECTED  30 tablet  2  . fluticasone (FLONASE) 50 MCG/ACT nasal spray Place 2 sprays into the nose daily as needed.       . fluticasone (FLONASE) 50 MCG/ACT nasal spray SPRITZ 2 SPRAYS INTO EACH NOSTRIL DAILY  16 g  5  . lansoprazole (PREVACID) 30 MG capsule Take 30 mg by mouth daily.        Marland Kitchen losartan (COZAAR) 25 MG tablet Take 1 tablet (25 mg total) by mouth daily.  30 tablet  6  . Multiple Vitamins-Minerals (PRESERVISION AREDS 2) CAPS Take 1 capsule by mouth 2 (two) times daily.       . [DISCONTINUED] lisinopril (PRINIVIL,ZESTRIL) 10 MG tablet TAKE 1 TABLET (10 MG TOTAL) BY MOUTH DAILY.  90 tablet  3   No facility-administered encounter medications on file as of 03/22/2012.          Objective:   Physical Exam  Constitutional: She is oriented to person, place, and time. She appears well-developed and well-nourished.  HENT:  Head: Normocephalic and atraumatic.  Right Ear: External ear normal.  Left Ear: External ear normal.  Nose: Nose normal.  Mouth/Throat: Oropharynx is clear and moist.  TMs and canals are clear.   Eyes: Conjunctivae and EOM are normal. Pupils are equal, round, and reactive to light.  Neck: Neck supple. No thyromegaly present.  Cardiovascular: Normal rate, regular rhythm and normal heart sounds.   No carotid bruits  Pulmonary/Chest: Effort normal and breath sounds normal. She has no wheezes.   Musculoskeletal: She exhibits no edema.  Lymphadenopathy:    She has no cervical adenopathy.  Neurological: She is alert and oriented to person, place, and time.  Skin: Skin is warm and dry.  She does have a bandage over her left forehead which she recently had a squamous  cell skin cancer removed.  Psychiatric: She has a normal mood and affect. Her behavior is normal.          Assessment & Plan:  Dry cough from ACEi  - will change to ARB and see if resolved. Followup in one month to recheck blood pressure make sure still well controlled on losartan instead of lisinopril.  Neck  swelling - Unclear etiology.  No swelling on exam today. No enlarged LN. OP is clear. Call if happens again.    HTN - Well controlled. Continue current regimen.Due to reck CMP.    CVA- taking ASA and plavix.    Hyperlpidemia- ON crestor. She wants to discuss this further at f/u visit.

## 2012-03-23 LAB — COMPLETE METABOLIC PANEL WITH GFR
ALT: 9 U/L (ref 0–35)
AST: 16 U/L (ref 0–37)
Albumin: 3.9 g/dL (ref 3.5–5.2)
CO2: 31 mEq/L (ref 19–32)
Calcium: 9.5 mg/dL (ref 8.4–10.5)
Chloride: 104 mEq/L (ref 96–112)
Creat: 0.83 mg/dL (ref 0.50–1.10)
GFR, Est African American: 79 mL/min
Potassium: 4.3 mEq/L (ref 3.5–5.3)
Sodium: 140 mEq/L (ref 135–145)
Total Protein: 6.7 g/dL (ref 6.0–8.3)

## 2012-03-23 NOTE — Progress Notes (Signed)
Quick Note:  All labs are normal. ______ 

## 2012-04-19 ENCOUNTER — Encounter: Payer: Self-pay | Admitting: Family Medicine

## 2012-04-19 ENCOUNTER — Ambulatory Visit (INDEPENDENT_AMBULATORY_CARE_PROVIDER_SITE_OTHER): Payer: Medicare Other | Admitting: Family Medicine

## 2012-04-19 VITALS — BP 125/68 | HR 60 | Wt 210.0 lb

## 2012-04-19 DIAGNOSIS — I6350 Cerebral infarction due to unspecified occlusion or stenosis of unspecified cerebral artery: Secondary | ICD-10-CM

## 2012-04-19 DIAGNOSIS — R05 Cough: Secondary | ICD-10-CM

## 2012-04-19 DIAGNOSIS — K219 Gastro-esophageal reflux disease without esophagitis: Secondary | ICD-10-CM

## 2012-04-19 DIAGNOSIS — R413 Other amnesia: Secondary | ICD-10-CM

## 2012-04-19 NOTE — Patient Instructions (Addendum)
You can try [employer] or Lifestyle blood pressure machine.  Get the arm cuff (not the wrist machine) We will schedule you with the ear nose and throat specialist for your chronic cough.

## 2012-04-19 NOTE — Progress Notes (Signed)
  Subjective:    Patient ID: Michelle [name], female    DOB: May 02, 1932, 79 y.o.   MRN: [id number]  HPI HTN - We had switched her ACEi to ARB for dry cough. She says it has been much better but hss had a cough for hte lat 2 days.  She is still claring her throat and voice gets raspy. She is taking prevacid.   She has concerns of memory loss. Wants to discuss the Crestor. She is taking 10mg  every other day. No Myalgias.    She has been more forgetful with little things such as names, appointments, etc. she says it hasn't been too disruptive to her daily life but as it concerned her. She has a history of a stroke.  Review of Systems     Objective:   Physical Exam  Constitutional: She is oriented to person, place, and time. She appears well-developed and well-nourished.  HENT:  Head: Normocephalic and atraumatic.  Cardiovascular: Normal rate, regular rhythm and normal heart sounds.   Pulmonary/Chest: Effort normal and breath sounds normal.  Neurological: She is alert and oriented to person, place, and time.  Skin: Skin is warm and dry.  Psychiatric: She has a normal mood and affect. Her behavior is normal.          Assessment & Plan:  Dry cough - cough originates in her throat. Changed her off of ACE and she is already on PPI. Will refer to ENT for further evaluation. I really don't think that this is coming from her chest as her symptoms and the stem mostly from her throat. If in the she's on a PPI she could still be getting some reflux thus I would like to start with an ENT evaluation to look at her throat and vocal cords. If she has evidence of reflux then we might need to consider further workup by GI. She has a gastroneurology is.  Memory problems - with hx of stroke I am very hesitant to take her off her statin.  I discussed how this is extremely important. Certainly the stroke affected her memory as well. The options are to change statins and see if she has similar effects or if she  does better on it. I would probably start with Lipitor because is now generic and has the power of Crestor. We could also consider evaluating her with the Mini-Mental status exam at her followup visit. Followup in 2-3 months.  HTN - doing well and BP looks great today.  Tolerating her new losartan well. Blood pressure looks absolutely fantastic. We'll continue to follow periodically to see her back in 2-3 months.

## 2012-05-22 ENCOUNTER — Encounter: Payer: Medicare Other | Admitting: Family Medicine

## 2012-05-24 ENCOUNTER — Encounter: Payer: Self-pay | Admitting: Family Medicine

## 2012-05-24 ENCOUNTER — Ambulatory Visit (INDEPENDENT_AMBULATORY_CARE_PROVIDER_SITE_OTHER): Payer: Medicare Other | Admitting: Family Medicine

## 2012-05-24 VITALS — BP 128/74 | HR 61 | Wt 207.0 lb

## 2012-05-24 DIAGNOSIS — I10 Essential (primary) hypertension: Secondary | ICD-10-CM

## 2012-05-24 DIAGNOSIS — Z0000 Encounter for general adult medical examination without abnormal findings: Secondary | ICD-10-CM

## 2012-05-24 DIAGNOSIS — K219 Gastro-esophageal reflux disease without esophagitis: Secondary | ICD-10-CM

## 2012-05-24 NOTE — Progress Notes (Signed)
Subjective:    Michelle [name] is a 79 y.o. female who presents for Medicare Annual/Subsequent preventive examination.  Preventive Screening-Counseling & Management  Tobacco History  Smoking status  . Former Smoker  . Quit date: 02/01/1996  Smokeless tobacco  . Never Used     Problems Prior to Visit 1. HTN - Brought in log of BPs.  Mostly running int he 12-140s.   2. GERD - ON prevacid daily.  Last week had bad heartburn after eating [place] food. Took several TUMs.    Current Problems (verified) Patient Active Problem List  Diagnosis  . HYPERLIPIDEMIA  . OBESITY  . HYPERTENSION  . CVA  . GERD  . Breast cancer, female  . Macular degeneration  . Obese  . Squamous cell skin cancer, face    Medications Prior to Visit Current Outpatient Prescriptions on File Prior to Visit  Medication Sig Dispense Refill  . aspirin 325 MG tablet Take 325 mg by mouth at bedtime.        . clopidogrel (PLAVIX) 75 MG tablet TAKE 1 TABLET (75 MG TOTAL) BY MOUTH DAILY.  30 tablet  4  . CRESTOR 20 MG tablet TAKE 1 TABLET BY MOUTH EVERY DAY OR AS DIRECTED  30 tablet  2  . fluticasone (FLONASE) 50 MCG/ACT nasal spray Place 2 sprays into the nose daily as needed.       . lansoprazole (PREVACID) 30 MG capsule Take 30 mg by mouth daily.        Marland Kitchen losartan (COZAAR) 25 MG tablet Take 1 tablet (25 mg total) by mouth daily.  30 tablet  6  . Multiple Vitamins-Minerals (PRESERVISION AREDS 2) CAPS Take 1 capsule by mouth 2 (two) times daily.        No current facility-administered medications on file prior to visit.    Current Medications (verified) Current Outpatient Prescriptions  Medication Sig Dispense Refill  . aspirin 325 MG tablet Take 325 mg by mouth at bedtime.        . clopidogrel (PLAVIX) 75 MG tablet TAKE 1 TABLET (75 MG TOTAL) BY MOUTH DAILY.  30 tablet  4  . CRESTOR 20 MG tablet TAKE 1 TABLET BY MOUTH EVERY DAY OR AS DIRECTED  30 tablet  2  . fluticasone (FLONASE) 50 MCG/ACT nasal spray  Place 2 sprays into the nose daily as needed.       . lansoprazole (PREVACID) 30 MG capsule Take 30 mg by mouth daily.        Marland Kitchen losartan (COZAAR) 25 MG tablet Take 1 tablet (25 mg total) by mouth daily.  30 tablet  6  . Multiple Vitamins-Minerals (PRESERVISION AREDS 2) CAPS Take 1 capsule by mouth 2 (two) times daily.        No current facility-administered medications for this visit.     Allergies (verified) Benadryl; Doxycycline; Simvastatin; and Lisinopril   PAST HISTORY  Family History Family History  Problem Relation Age of Onset  . Cancer Sister 79    Lymphoma  . Cancer Paternal Aunt     Breast cancer  . Cancer Daughter 18    Bilateral Breast Cancer  . Cancer Daughter     Breast Cancer  . Cancer Sister     2005 - Renal Cell Cancer and Breast Cancer    Social History History  Substance Use Topics  . Smoking status: Former Smoker    Quit date: 02/01/1996  . Smokeless tobacco: Never Used  . Alcohol Use: Yes  Comment: rare     Are there smokers in your home (other than you)? No  Risk Factors Current exercise habits: no regular exercise  Dietary issues discussed: says not eating the best   Cardiac risk factors: advanced age (older than 36 for men, 42 for women).  Depression Screen (Note: if answer to either of the following is "Yes", a more complete depression screening is indicated)   Over the past two weeks, have you felt down, depressed or hopeless? No  Over the past two weeks, have you felt little interest or pleasure in doing things? No  Have you lost interest or pleasure in daily life? No  Do you often feel hopeless? No  Do you cry easily over simple problems? No  Activities of Daily Living In your present state of health, do you have any difficulty performing the following activities?:  Driving? No Managing money?  No Feeding yourself? No Getting from bed to chair? No Climbing a flight of stairs? Yes, bc of knee pain Preparing food and eating?:  No Bathing or showering? No Getting dressed: No Getting to the toilet? No Using the toilet:No Moving around from place to place: No In the past year have you fallen or had a near fall?:No   Are you sexually active?  No  Do you have more than one partner?  No  Hearing Difficulties: No Do you often ask people to speak up or repeat themselves? Yes Do you experience ringing or noises in your ears? No Do you have difficulty understanding soft or whispered voices? No   Do you feel that you have a problem with memory? Yes, feels from the crestor  Do you often misplace items? No  Do you feel safe at home?  Yes  Cognitive Testing  Alert? Yes  Normal Appearance?Yes  Oriented to person? Yes  Place? Yes   Time? Yes  Recall of three objects?  Yes  Can perform simple calculations? Yes  Displays appropriate judgment?Yes  Can read the correct time from a watch face?Yes   Advanced Directives have been discussed with the patient? Yes  List the Names of Other Physician/Practitioners you currently use: 1.  Dr. Elmer Picker and Dr. Nicholas Lose and Dr. Trey Paula Petrinitz  Indicate any recent Medical Services you may have received from other than [facility] providers in the past year (date may be approximate).  Immunization History  Administered Date(s) Administered  . Influenza Split 12/08/2010, 11/01/2011  . Influenza Whole 12/13/2006, 11/19/2008, 11/11/2009  . Pneumococcal Polysaccharide 12/13/2006  . Tdap 10/06/2010  . Zoster 12/08/2010    Screening Tests Health Maintenance  Topic Date Due  . Influenza Vaccine  10/01/2012  . Colonoscopy  11/26/2018  . Tetanus/tdap  10/05/2020  . Pneumococcal Polysaccharide Vaccine Age 64 And Over  Completed  . Zostavax  Completed    All answers were reviewed with the patient and necessary referrals were made:  Michelle Capshaw, MD   05/24/2012   History reviewed: allergies, current medications, past family history, past medical history, past social history, past  surgical history and problem list  Review of Systems A comprehensive review of systems was negative.    Objective:     Vision by Snellen chart: right eye had eye exam in march  Body mass index is 32.41 kg/(m^2). BP 128/74  Pulse 61  Wt 207 lb (93.895 kg)  BMI 32.41 kg/m2  BP 128/74  Pulse 61  Wt 207 lb (93.895 kg)  BMI 32.41 kg/m2  General Appearance:    Alert, cooperative,  no distress, appears stated age  Head:    Normocephalic, without obvious abnormality, atraumatic  Eyes:    PERRL, conjunctiva/corneas clear, EOM's intact, both eyes  Ears:    Normal TM's and external ear canals, both ears  Nose:   Nares normal, septum midline, mucosa normal, no drainage    or sinus tenderness  Throat:   Lips, mucosa, and tongue normal; teeth and gums normal  Neck:   Supple, symmetrical, trachea midline, no adenopathy;    thyroid:  no enlargement/tenderness/nodules; no carotid   bruit or JVD  Back:     Symmetric, no curvature, ROM normal, no CVA tenderness  Lungs:     Clear to auscultation bilaterally, respirations unlabored  Chest Wall:    No tenderness or deformity   Heart:    Regular rate and rhythm, S1 and S2 normal, no murmur, rub   or gallop  Breast Exam:    Not perfromed  Abdomen:     Soft, non-tender, bowel sounds active all four quadrants,    no masses, no organomegaly  Genitalia:    Not performed.   Rectal:    Not perfromed.   Extremities:   Extremities normal, atraumatic, no cyanosis or edema  Pulses:   2+ and symmetric all extremities  Skin:   Skin color, texture, turgor normal, no rashes or lesions  Lymph nodes:   Cervical, supraclavicular nodes normal  Neurologic:   CNII-XII intact, normal strength, sensation and reflexes    throughout       Assessment:     Annual Medicare Wellness Exam       Plan:     During the course of the visit the patient was educated and counseled about appropriate screening and preventive services including:    vacines UTD  HTN-  Well controlled. F.U in 6 mo.    GERD- doing well on Prevacid. Discussed weaning down on PPI and possible stopping bc of inc fracture risk.                                      Hyperlipidemia - WEll controlled.  Lab Results  Component Value Date   CHOL 157 09/07/2011   HDL 48.20 09/07/2011   LDLCALC 81 09/07/2011   LDLDIRECT 146.4 12/17/2007   TRIG 139.0 09/07/2011   CHOLHDL 3 09/07/2011      Diet review for nutrition referral? Yes ____  Not Indicated x___   Patient Instructions (the written plan) was given to the patient.  Medicare Attestation I have personally reviewed: The patient's medical and social history Their use of alcohol, tobacco or illicit drugs Their current medications and supplements The patient's functional ability including ADLs,fall risks, home safety risks, cognitive, and hearing and visual impairment Diet and physical activities Evidence for depression or mood disorders  The patient's weight, height, BMI, and visual acuity have been recorded in the chart.  I have made referrals, counseling, and provided education to the patient based on review of the above and I have provided the patient with a written personalized care plan for preventive services.     Michelle Cardamone, MD   05/24/2012

## 2012-06-05 ENCOUNTER — Other Ambulatory Visit: Payer: Self-pay | Admitting: Dermatology

## 2012-07-11 ENCOUNTER — Telehealth: Payer: Self-pay | Admitting: *Deleted

## 2012-07-11 ENCOUNTER — Other Ambulatory Visit: Payer: Self-pay | Admitting: Family Medicine

## 2012-07-11 MED ORDER — ROSUVASTATIN CALCIUM 20 MG PO TABS: ORAL_TABLET | ORAL | Status: DC

## 2012-07-11 NOTE — Telephone Encounter (Signed)
Pt called and she wanted to know if her Rx for crestor can be written the same as it was when Dr.Swords was prescribing it for her. I have sent this into her pharmacy as requested.Loralee Pacas [place]

## 2012-07-24 ENCOUNTER — Ambulatory Visit (INDEPENDENT_AMBULATORY_CARE_PROVIDER_SITE_OTHER): Payer: Medicare Other | Admitting: Family Medicine

## 2012-07-24 ENCOUNTER — Encounter: Payer: Self-pay | Admitting: Family Medicine

## 2012-07-24 VITALS — BP 139/79 | HR 55 | Wt 207.0 lb

## 2012-07-24 DIAGNOSIS — R609 Edema, unspecified: Secondary | ICD-10-CM

## 2012-07-24 DIAGNOSIS — S6000XA Contusion of unspecified finger without damage to nail, initial encounter: Secondary | ICD-10-CM

## 2012-07-24 DIAGNOSIS — S60011A Contusion of right thumb without damage to nail, initial encounter: Secondary | ICD-10-CM

## 2012-07-24 DIAGNOSIS — M25473 Effusion, unspecified ankle: Secondary | ICD-10-CM

## 2012-07-24 DIAGNOSIS — I10 Essential (primary) hypertension: Secondary | ICD-10-CM

## 2012-07-24 NOTE — Patient Instructions (Addendum)
Cut back on salt in your diet.  Under 2000mg  a day.  2 Gram Low Sodium Diet A 2 gram sodium diet restricts the amount of sodium in the diet to no more than 2 g or 2000 mg daily. Limiting the amount of sodium is often used to help lower blood pressure. It is important if you have heart, liver, or kidney problems. Many foods contain sodium for flavor and sometimes as a preservative. When the amount of sodium in a diet needs to be low, it is important to know what to look for when choosing foods and drinks. The following includes some information and guidelines to help make it easier for you to adapt to a low sodium diet. QUICK TIPS  Do not add salt to food.  Avoid convenience items and fast food.  Choose unsalted snack foods.  Buy lower sodium products, often labeled as "lower sodium" or "no salt added."  Check food labels to learn how much sodium is in 1 serving.  When eating at a restaurant, ask that your food be prepared with less salt or none, if possible. READING FOOD LABELS FOR SODIUM INFORMATION The nutrition facts label is a good place to find how much sodium is in foods. Look for products with no more than 500 to 600 mg of sodium per meal and no more than 150 mg per serving. Remember that 2 g = 2000 mg. The food label may also list foods as:  Sodium-free: Less than 5 mg in a serving.  Very low sodium: 35 mg or less in a serving.  Low-sodium: 140 mg or less in a serving.  Light in sodium: 50% less sodium in a serving. For example, if a food that usually has 300 mg of sodium is changed to become light in sodium, it will have 150 mg of sodium.  Reduced sodium: 25% less sodium in a serving. For example, if a food that usually has 400 mg of sodium is changed to reduced sodium, it will have 300 mg of sodium. CHOOSING FOODS Grains  Avoid: Salted crackers and snack items. Some cereals, including instant hot cereals. Bread stuffing and biscuit mixes. Seasoned rice or pasta  mixes.  Choose: Unsalted snack items. Low-sodium cereals, oats, puffed wheat and rice, shredded wheat. English muffins and bread. Pasta. Meats  Avoid: Salted, canned, smoked, spiced, pickled meats, including fish and poultry. Bacon, ham, sausage, cold cuts, hot dogs, anchovies.  Choose: Low-sodium canned tuna and salmon. Fresh or frozen meat, poultry, and fish. Dairy  Avoid: Processed cheese and spreads. Cottage cheese. Buttermilk and condensed milk. Regular cheese.  Choose: Milk. Low-sodium cottage cheese. Yogurt. Sour cream. Low-sodium cheese. Fruits and Vegetables  Avoid: Regular canned vegetables. Regular canned tomato sauce and paste. Frozen vegetables in sauces. Olives. Rosita Fire. Relishes. Sauerkraut.  Choose: Low-sodium canned vegetables. Low-sodium tomato sauce and paste. Frozen or fresh vegetables. Fresh and frozen fruit. Condiments  Avoid: Canned and packaged gravies. Worcestershire sauce. Tartar sauce. Barbecue sauce. Soy sauce. Steak sauce. Ketchup. Onion, garlic, and table salt. Meat flavorings and tenderizers.  Choose: Fresh and dried herbs and spices. Low-sodium varieties of mustard and ketchup. Lemon juice. Tabasco sauce. Horseradish. SAMPLE 2 GRAM SODIUM MEAL PLAN Breakfast / Sodium (mg)  1 cup low-fat milk / 143 mg  2 slices whole-wheat toast / 270 mg  1 tbs heart-healthy margarine / 153 mg  1 hard-boiled egg / 139 mg  1 small orange / 0 mg Lunch / Sodium (mg)  1 cup raw carrots / 76  mg   cup hummus / 298 mg  1 cup low-fat milk / 143 mg   cup red grapes / 2 mg  1 whole-wheat pita bread / 356 mg Dinner / Sodium (mg)  1 cup whole-wheat pasta / 2 mg  1 cup low-sodium tomato sauce / 73 mg  3 oz lean ground beef / 57 mg  1 small side salad (1 cup raw spinach leaves,  cup cucumber,  cup yellow bell pepper) with 1 tsp olive oil and 1 tsp red wine vinegar / 25 mg Snack / Sodium (mg)  1 container low-fat vanilla yogurt / 107 mg  3 graham cracker  squares / 127 mg Nutrient Analysis  Calories: 2033  Protein: 77 g  Carbohydrate: 282 g  Fat: 72 g  Sodium: 1971 mg Document Released: 01/17/2005 Document Revised: 04/11/2011 Document Reviewed: 04/20/2009 ExitCare Patient Information 2014 [employer]. 2 Gram Low Sodium Diet A 2 gram sodium diet restricts the amount of sodium in the diet to no more than 2 g or 2000 mg daily. Limiting the amount of sodium is often used to help lower blood pressure. It is important if you have heart, liver, or kidney problems. Many foods contain sodium for flavor and sometimes as a preservative. When the amount of sodium in a diet needs to be low, it is important to know what to look for when choosing foods and drinks. The following includes some information and guidelines to help make it easier for you to adapt to a low sodium diet. QUICK TIPS  Do not add salt to food.  Avoid convenience items and fast food.  Choose unsalted snack foods.  Buy lower sodium products, often labeled as "lower sodium" or "no salt added."  Check food labels to learn how much sodium is in 1 serving.  When eating at a restaurant, ask that your food be prepared with less salt or none, if possible. READING FOOD LABELS FOR SODIUM INFORMATION The nutrition facts label is a good place to find how much sodium is in foods. Look for products with no more than 500 to 600 mg of sodium per meal and no more than 150 mg per serving. Remember that 2 g = 2000 mg. The food label may also list foods as:  Sodium-free: Less than 5 mg in a serving.  Very low sodium: 35 mg or less in a serving.  Low-sodium: 140 mg or less in a serving.  Light in sodium: 50% less sodium in a serving. For example, if a food that usually has 300 mg of sodium is changed to become light in sodium, it will have 150 mg of sodium.  Reduced sodium: 25% less sodium in a serving. For example, if a food that usually has 400 mg of sodium is changed to reduced  sodium, it will have 300 mg of sodium. CHOOSING FOODS Grains  Avoid: Salted crackers and snack items. Some cereals, including instant hot cereals. Bread stuffing and biscuit mixes. Seasoned rice or pasta mixes.  Choose: Unsalted snack items. Low-sodium cereals, oats, puffed wheat and rice, shredded wheat. English muffins and bread. Pasta. Meats  Avoid: Salted, canned, smoked, spiced, pickled meats, including fish and poultry. Bacon, ham, sausage, cold cuts, hot dogs, anchovies.  Choose: Low-sodium canned tuna and salmon. Fresh or frozen meat, poultry, and fish. Dairy  Avoid: Processed cheese and spreads. Cottage cheese. Buttermilk and condensed milk. Regular cheese.  Choose: Milk. Low-sodium cottage cheese. Yogurt. Sour cream. Low-sodium cheese. Fruits and Vegetables  Avoid: Regular  canned vegetables. Regular canned tomato sauce and paste. Frozen vegetables in sauces. Olives. Rosita Fire. Relishes. Sauerkraut.  Choose: Low-sodium canned vegetables. Low-sodium tomato sauce and paste. Frozen or fresh vegetables. Fresh and frozen fruit. Condiments  Avoid: Canned and packaged gravies. Worcestershire sauce. Tartar sauce. Barbecue sauce. Soy sauce. Steak sauce. Ketchup. Onion, garlic, and table salt. Meat flavorings and tenderizers.  Choose: Fresh and dried herbs and spices. Low-sodium varieties of mustard and ketchup. Lemon juice. Tabasco sauce. Horseradish. SAMPLE 2 GRAM SODIUM MEAL PLAN Breakfast / Sodium (mg)  1 cup low-fat milk / 143 mg  2 slices whole-wheat toast / 270 mg  1 tbs heart-healthy margarine / 153 mg  1 hard-boiled egg / 139 mg  1 small orange / 0 mg Lunch / Sodium (mg)  1 cup raw carrots / 76 mg   cup hummus / 298 mg  1 cup low-fat milk / 143 mg   cup red grapes / 2 mg  1 whole-wheat pita bread / 356 mg Dinner / Sodium (mg)  1 cup whole-wheat pasta / 2 mg  1 cup low-sodium tomato sauce / 73 mg  3 oz lean ground beef / 57 mg  1 small side salad (1  cup raw spinach leaves,  cup cucumber,  cup yellow bell pepper) with 1 tsp olive oil and 1 tsp red wine vinegar / 25 mg Snack / Sodium (mg)  1 container low-fat vanilla yogurt / 107 mg  3 graham cracker squares / 127 mg Nutrient Analysis  Calories: 2033  Protein: 77 g  Carbohydrate: 282 g  Fat: 72 g  Sodium: 1971 mg Document Released: 01/17/2005 Document Revised: 04/11/2011 Document Reviewed: 04/20/2009 ExitCare Patient Information 2014 [place], [place].

## 2012-07-24 NOTE — Progress Notes (Signed)
  Subjective:    Patient ID: Michelle [name], female    DOB: [birth year], 79 y.o.   MRN: [id number]  HPI Says was working out in the yeard and pushing a wheelbarrow and hurt her thumb.  She denies hitting it on anything. Noticed a pimple on it. It resolved and now has a large bruise.  She is on plavix and she has been told has tendonitis in that thumb as well. She says the swelling is improving. Has been icing it.  No loss of strength.    Occ left ankle swelling, and now noticed the right is swollen.  No CP or SOB.  No swelling in her hands.  Swelling will get worse the longer up on her feet.    Review of Systems     Objective:   Physical Exam  Constitutional: She is oriented to person, place, and time. She appears well-developed and well-nourished.  HENT:  Head: Normocephalic and atraumatic.  Cardiovascular: Normal rate, regular rhythm and normal heart sounds.   Pulmonary/Chest: Effort normal and breath sounds normal.  Musculoskeletal: She exhibits edema.  Trace edema bilaterally  Neurological: She is alert and oriented to person, place, and time.  Skin: Skin is warm and dry.  Psychiatric: She has a normal mood and affect. Her behavior is normal.     Base of right thumb is still swollen with bruising. NROM and strength is normal.  Mildly tender.      Assessment & Plan:  Right thumb pain- likely contusion. No evid of fracture. Continue to ice it as needed. Call if not better in 2-3 weeks.    HTN- well controlled.    Bilateral ankle swelling-trace edema today. Discussed that this is most likely consistent with venous stasis. I will check a urinalysis, kidney and liver function just to make sure that there is nothing going on here. We'll also check a BNP that she's not having any chest pain or shortness of breath. I did encourage her to follow a strict low salt diet. She feels like that we'll be a little difficult but says she will try. Handout given. Call if the swelling suddenly gets  worse. Otherwise I will see her in October at her prescheduled appointment.

## 2012-07-25 LAB — COMPLETE METABOLIC PANEL WITH GFR
AST: 20 U/L (ref 0–37)
Albumin: 4 g/dL (ref 3.5–5.2)
Alkaline Phosphatase: 97 U/L (ref 39–117)
BUN: 11 mg/dL (ref 6–23)
Creat: 0.9 mg/dL (ref 0.50–1.10)
GFR, Est Non African American: 62 mL/min
Glucose, Bld: 86 mg/dL (ref 70–99)

## 2012-07-25 LAB — URINALYSIS
Glucose, UA: NEGATIVE mg/dL
Hgb urine dipstick: NEGATIVE
Leukocytes, UA: NEGATIVE
Nitrite: NEGATIVE
Protein, ur: NEGATIVE mg/dL
pH: 5.5 (ref 5.0–8.0)

## 2012-07-25 LAB — BRAIN NATRIURETIC PEPTIDE: Brain Natriuretic Peptide: 55.5 pg/mL (ref 0.0–100.0)

## 2012-08-13 ENCOUNTER — Other Ambulatory Visit: Payer: Self-pay | Admitting: *Deleted

## 2012-09-18 ENCOUNTER — Other Ambulatory Visit: Payer: Self-pay | Admitting: Gastroenterology

## 2012-10-08 ENCOUNTER — Encounter: Payer: Self-pay | Admitting: Family Medicine

## 2012-10-18 ENCOUNTER — Other Ambulatory Visit: Payer: Self-pay | Admitting: Family Medicine

## 2012-10-24 ENCOUNTER — Encounter (INDEPENDENT_AMBULATORY_CARE_PROVIDER_SITE_OTHER): Payer: Self-pay | Admitting: General Surgery

## 2012-10-24 ENCOUNTER — Ambulatory Visit (INDEPENDENT_AMBULATORY_CARE_PROVIDER_SITE_OTHER): Payer: Medicare Other | Admitting: General Surgery

## 2012-10-24 VITALS — BP 128/80 | HR 74 | Temp 97.4°F | Resp 16 | Ht 67.0 in | Wt 202.0 lb

## 2012-10-24 DIAGNOSIS — Z853 Personal history of malignant neoplasm of breast: Secondary | ICD-10-CM

## 2012-10-24 NOTE — Patient Instructions (Signed)
Breast Self-Examination You should begin examining your breasts at age 79 even though the risk for breast cancer is low in this age group. It is important to become familiar with how your breasts look and feel. This is true for pregnant women, nursing mothers, women in menopause and women who have breast implants.  Women should examine their breasts once a month to look for changes and lumps. By doing monthly breast exams, you get to know how your breasts feel and how they can change from month to month. This allows you to pick up changes early. It can also offer you some reassurance that your breast health is good. This exam only takes minutes. Most breast lumps are not caused by cancer. If you find a lump, a special x-ray called a mammogram, or other tests may be needed to determine what is wrong.  Some of the signs that a breast lump is caused by cancer include:  Dimpling of the skin or changes in the shape of the breast or nipple.   A dark-colored or bloody discharge from the nipple.   Swollen lymph glands around the breast or in the armpit.   Redness of the breast or nipple.   Scaly nipple or skin on the breast.   Pain or swelling of the breast.  SELF-EXAM There are a few points to follow when doing a thorough breast exam. The best time to examine your breasts is 5 to 7 days after the menstrual period is over. During menstruation, the breasts are lumpier, and it may be more difficult to pick up changes. If you do not menstruate, have reached menopause or had a hysterectomy, examine your breasts the first day of every month. After three to four months, you will become more familiar with the variations of your breasts and more comfortable with the exam.  Perform your breast exam monthly. Keep a written record with breast changes or normal findings for each breast. This makes it easier to be sure of changes and to not solely depend on memory for size, tenderness, or location. Try to do the exam  at the same time each month, and write down where you are in your menstrual cycle if you are still menstruating.   Look at your breasts. Stand in front of a mirror with your hands clasped behind your head. Tighten your chest muscles and look for asymmetry. This means a difference in shape or contour from one breast to the other, such as puckers, dips or bumps. Look also for skin changes.   Lean forward with your hands on your hips. Again, look for symmetry and skin changes.   While showering, soap the breasts, and carefully feel the breasts with fingertips while holding the arm (on the side of the breast being examined) over the head. Do this with each breast carefully feeling for lumps or changes. Typically, a circular motion with moderate fingertip pressure should be used.   Repeat this exam while lying on your back, again with your arm behind your head and a pillow under your shoulders. Again, use your fingertips to examine both breasts, feeling for lumps and thickening. Begin at 1 o'clock and go clockwise around the whole breast.   At the end of your exam, gently squeeze each nipple to see if there is any drainage. Look for nipple changes, dimpling or redness.   Lastly, examine the upper chest and clavicle areas and in your armpits.  It is not necessary to become alarmed if you find   a breast lump. Most of them are not cancerous. However, it is necessary to see your caregiver if a lump is found in order to have it looked at. Document Released: 02/25/2004 Document Revised: 09/29/2010 Document Reviewed: 05/06/2008 ExitCare Patient Information 2012 [employer]. 

## 2012-10-24 NOTE — Progress Notes (Signed)
Subjective:     Patient ID: Michelle [name], female   DOB: 12-20-1932, 79 y.o.   MRN: [id number]  HPI This is a 79 year old female who I know from a right breast lumpectomy and sentinel biopsy in 2013 for a stage I right breast cancer. This was followed by MammoSite therapy. She did well with all of this. She declined antiestrogen therapy postoperatively. She has done well and I have been following her. Since our last visit a year ago she has had some episodes of some right breast pain that occurred with some heavy lifting and resolved completely on her own. She has no real complaints today on her evaluation. She has had a very good summer. She has no complaints of any discharge and is not palpate any masses on her self-exam. She underwent mammography on August 26 of this year. There is no concerning changes identified on the left. There is some calcifications on the right along the posterior margin of the lumpectomy site compatible with dystrophic calcifications. On she underwent a right breast ultrasound with an area that appeared to be a lipoma. There was also an area at her prior lumpectomy site where the MammoSite that showed a calcific rim. She was recommended for follow up in one year. She has been having some skin cancers removed but otherwise is in good health Review of Systems  Constitutional: Negative for fever, chills and unexpected weight change.  HENT: Negative for hearing loss, congestion, sore throat, trouble swallowing and voice change.   Eyes: Negative for visual disturbance.  Respiratory: Negative for cough and wheezing.   Cardiovascular: Negative for chest pain, palpitations and leg swelling.  Gastrointestinal: Negative for nausea, vomiting, abdominal pain, diarrhea, constipation, blood in stool, abdominal distention and anal bleeding.  Genitourinary: Negative for hematuria, vaginal bleeding and difficulty urinating.  Musculoskeletal: Negative for arthralgias.  Skin: Negative for rash  and wound.  Neurological: Negative for seizures, syncope and headaches.  Hematological: Negative for adenopathy. Bruises/bleeds easily.  Psychiatric/Behavioral: Negative for confusion.       Objective:   Physical Exam  Constitutional: She appears well-developed and well-nourished.  Neck: Neck supple.  Pulmonary/Chest: Right breast exhibits no inverted nipple, no mass, no nipple discharge, no skin change and no tenderness. Left breast exhibits no inverted nipple, no mass, no nipple discharge, no skin change and no tenderness.    Lymphadenopathy:    She has no cervical adenopathy.    She has no axillary adenopathy.       Right: No supraclavicular adenopathy present.       Left: No supraclavicular adenopathy present.       Assessment:     Clinical stage I right breast cancer    Plan:     She has a date mammogram. She is recommended for follow up in one year. She has a exam that is not inserted all today. We talked a little bit about MammoSite therapy with some literature that has been out recently. She has no clinical evidence of recurrence. I discussed with her continuing around self exams, obtaining her mammogram at the appropriate time, and I will see her back in one year.

## 2012-11-21 ENCOUNTER — Other Ambulatory Visit: Payer: Self-pay | Admitting: Dermatology

## 2012-11-22 ENCOUNTER — Ambulatory Visit (INDEPENDENT_AMBULATORY_CARE_PROVIDER_SITE_OTHER): Payer: Medicare Other | Admitting: Family Medicine

## 2012-11-22 ENCOUNTER — Encounter: Payer: Self-pay | Admitting: Family Medicine

## 2012-11-22 VITALS — BP 130/74 | HR 55 | Wt 199.0 lb

## 2012-11-22 DIAGNOSIS — Z23 Encounter for immunization: Secondary | ICD-10-CM

## 2012-11-22 DIAGNOSIS — Z87891 Personal history of nicotine dependence: Secondary | ICD-10-CM

## 2012-11-22 DIAGNOSIS — E785 Hyperlipidemia, unspecified: Secondary | ICD-10-CM

## 2012-11-22 DIAGNOSIS — R05 Cough: Secondary | ICD-10-CM

## 2012-11-22 DIAGNOSIS — I10 Essential (primary) hypertension: Secondary | ICD-10-CM

## 2012-11-22 LAB — COMPLETE METABOLIC PANEL WITH GFR
ALT: 11 U/L (ref 0–35)
AST: 19 U/L (ref 0–37)
Alkaline Phosphatase: 94 U/L (ref 39–117)
BUN: 11 mg/dL (ref 6–23)
Chloride: 108 mEq/L (ref 96–112)
Creat: 0.84 mg/dL (ref 0.50–1.10)
Potassium: 4.3 mEq/L (ref 3.5–5.3)

## 2012-11-22 LAB — LIPID PANEL
Cholesterol: 142 mg/dL (ref 0–200)
HDL: 47 mg/dL (ref >39)
Triglycerides: 124 mg/dL (ref <150)
VLDL: 25 mg/dL (ref 0–40)

## 2012-11-22 NOTE — Patient Instructions (Signed)
Follow up in 6 months for Blood pressure and cholesterol.

## 2012-11-22 NOTE — Progress Notes (Signed)
Subjective:    Patient ID: Michelle [name], female    DOB: 1932/08/24, 79 y.o.   MRN: [id number]  HPI HTN -  Pt denies chest pain, SOB, dizziness, or heart palpitations.  Taking meds as directed w/o problems.  Denies medication side effects. Says her cough is much better off the ACEi.    Hyperlipidemia - doing well on statin on crestor.   Lab Results  Component Value Date   CHOL 157 09/07/2011   HDL 48.20 09/07/2011   LDLCALC 81 09/07/2011   LDLDIRECT 146.4 12/17/2007   TRIG 139.0 09/07/2011   CHOLHDL 3 09/07/2011     Had a yellow jacke sting a few days ago.  She is taking any antihistamine for it. No SOB.    Review of Systems     BP 130/74  Pulse 55  Wt 199 lb (90.266 kg)  BMI 31.16 kg/m2    Allergies  Allergen Reactions  . Benadryl [Diphenhydramine Hcl]   . Doxycycline Other (See Comments)    acid reflux  . Simvastatin Other (See Comments)    REACTION:   . Lisinopril Other (See Comments)    Dry cough    Past Medical History  Diagnosis Date  . GERD (gastroesophageal reflux disease)   . Hyperlipidemia   . Shingles 2005  . Hypertension   . History of cerebrovascular accident   . Bell's palsy 12/84 (approx)  . Cancer 10/28/2009    squamous cell - upper right arm  . Cancer 10/28/09    basal cell -upper right back  . Change in vision   . Stroke 09/2007  . Hiatal hernia 2008    Past Surgical History  Procedure Laterality Date  . Cholecystectomy  2004  . Biospy  10/28/09    Shave Biospy skin Left hand(Acitinic Keratoses), Right Upper Arm (superficial Basal Cell), Right Upper Back(Superficial Basal Cell), Left Neck ( Solar Lentigo & Seborrheic Keratoses)  . Vocal cord poylps  1969    excision  . Tonsillectomy    . Colonoscopy    . Breast mammosite  03/14/2011    Procedure: MAMMOSITE BREAST;  Surgeon: Emelia Loron, MD;  Location: [facility];  Service: General;  Laterality: Right;  . Breast biopsy  02/11/11    Right Breast Needle Core Biospy - Upper Outer  [place]; ER/PR 100%, Her-2 Neu neg.; Ki-67 10%  . Breast surgery      right breast lumpectomy snbx    History   Social History  . Marital Status: Married    Spouse Name: N/A    Number of Children: N/A  . Years of Education: N/A   Occupational History  . Not on file.   Social History Main Topics  . Smoking status: Former Smoker    Quit date: 02/01/1996  . Smokeless tobacco: Never Used  . Alcohol Use: Yes     Comment: rare  . Drug Use: No  . Sexual Activity: Not on file     Comment: Menarche age 30, menopause age 79, HRT x 1year   Other Topics Concern  . Not on file   Social History Narrative  . No narrative on file    Family History  Problem Relation Age of Onset  . Cancer Sister 70    Lymphoma  . Cancer Paternal Aunt     Breast cancer  . Cancer Daughter 13    Bilateral Breast Cancer  . Cancer Daughter     Breast Cancer  . Cancer Sister  2005 - Renal Cell Cancer and Breast Cancer    Outpatient Encounter Prescriptions as of 11/22/2012  Medication Sig Dispense Refill  . aspirin 325 MG tablet Take 325 mg by mouth at bedtime.        . clopidogrel (PLAVIX) 75 MG tablet TAKE 1 TABLET (75 MG TOTAL) BY MOUTH DAILY.  30 tablet  4  . lansoprazole (PREVACID) 30 MG capsule Take 30 mg by mouth daily.        Marland Kitchen losartan (COZAAR) 25 MG tablet TAKE 1 TABLET (25 MG TOTAL) BY MOUTH DAILY.  30 tablet  6  . Multiple Vitamins-Minerals (PRESERVISION AREDS 2 PO) Take by mouth.      . rosuvastatin (CRESTOR) 20 MG tablet TAKE 1 TABLET BY MOUTH EVERY DAY OR AS DIRECTED  30 tablet  3  . fluticasone (FLONASE) 50 MCG/ACT nasal spray Place 2 sprays into the nose daily as needed.        No facility-administered encounter medications on file as of 11/22/2012.       Objective:   Physical Exam  Constitutional: She is oriented to person, place, and time. She appears well-developed and well-nourished.  HENT:  Head: Normocephalic and atraumatic.  Cardiovascular: Normal rate, regular  rhythm and normal heart sounds.   Pulmonary/Chest: Effort normal and breath sounds normal.  Musculoskeletal: She exhibits no edema.  Neurological: She is alert and oriented to person, place, and time.  Skin: Skin is warm and dry.  Psychiatric: She has a normal mood and affect. Her behavior is normal.          Assessment & Plan:  HTN - well controlled. Continue current regimen. Followup in 6 months. Cough is much better off the ACE inhibitor but still has a mild cough.  Hyperlipidemia - dong well on crestor. Due for repeat labs today.    Cough - Cough is much better off the ACE inhibitor but still has a mild cough. She did have some chronic interstitial changes seen on chest x-ray done approximately 18 months ago. She is a former smoker and quit in 1998. Like to schedule her first [place] in the next couple months.

## 2012-12-12 ENCOUNTER — Other Ambulatory Visit: Payer: Self-pay | Admitting: Internal Medicine

## 2013-01-11 ENCOUNTER — Other Ambulatory Visit: Payer: Self-pay | Admitting: Family Medicine

## 2013-02-20 ENCOUNTER — Other Ambulatory Visit: Payer: Self-pay | Admitting: Dermatology

## 2013-03-08 ENCOUNTER — Telehealth: Payer: Self-pay | Admitting: *Deleted

## 2013-03-08 ENCOUNTER — Other Ambulatory Visit: Payer: Self-pay | Admitting: Dermatology

## 2013-03-08 DIAGNOSIS — N950 Postmenopausal bleeding: Secondary | ICD-10-CM

## 2013-03-08 NOTE — Telephone Encounter (Addendum)
Pt called and stated that she has been having some spotting on and off and would like a referral to gyn.Michelle [name] [place]

## 2013-03-18 ENCOUNTER — Telehealth: Payer: Self-pay | Admitting: *Deleted

## 2013-03-18 NOTE — Telephone Encounter (Signed)
Form faxed and placed in folder. Called and spoke w/summer to expedite the request she informed that 1 request is given Q 180 days. She gave me the following # to try [phone] & [phone]) called the 1st # and it was a fax. So called [phone] and spoke W/[employer] and she took the information for the patient  and will sent this to start the appeal she stated that I will need to fax the following members Name, Address, ID, reason for appeal and provider signature to ATTN: Part D Rattan [phone] resent form with this information and faxed to number given and placed in folder.Audelia Hives [place]

## 2013-03-18 NOTE — Telephone Encounter (Signed)
Pt's daughter called back and stated that she has also been on the phone w/insurance co. And was told that her claim has been denied. I told her that I had faxed pt's information 2x to try to get the claim expedited for her and was told that it would have to go thru appeals. She is upset about this and understood the process however, she is concerned whether or not her mother will get the meds on time. I told her that if she needed to we will give her samples to get her through until this is resolved. She is greatly appreciative and will come by and p/u tomorrow.Audelia Hives [place]

## 2013-03-18 NOTE — Telephone Encounter (Addendum)
Spoke w/pt's daughter and informed her that I have received the PA forms and will fax them.Audelia Hives [place]

## 2013-03-21 NOTE — Telephone Encounter (Signed)
Called and informed pt's daughter that I had a VM informing me that pt's request for lower tier was denied. She stated that she would look into this. I told her that if there was anything else that I could do to please call.Audelia Hives [place]

## 2013-03-25 ENCOUNTER — Encounter: Payer: Medicare Other | Admitting: Obstetrics & Gynecology

## 2013-04-02 ENCOUNTER — Encounter: Payer: Self-pay | Admitting: Obstetrics & Gynecology

## 2013-04-02 ENCOUNTER — Ambulatory Visit (INDEPENDENT_AMBULATORY_CARE_PROVIDER_SITE_OTHER): Payer: Medicare Other | Admitting: Obstetrics & Gynecology

## 2013-04-02 VITALS — BP 143/74 | HR 62 | Temp 98.4°F | Resp 16 | Ht 67.0 in | Wt 196.0 lb

## 2013-04-02 DIAGNOSIS — N950 Postmenopausal bleeding: Secondary | ICD-10-CM | POA: Insufficient documentation

## 2013-04-02 NOTE — Progress Notes (Signed)
  Subjective:    Michelle [name] is a 80 y.o., post-menopausal female who presents for concerns regarding vaginal bleeding. She has been menopausal for 25+ years. Currently on no HRT.   Bleeding is described as scant staining and has occurred on and off since November 2014. Other menopausal symptoms include: none. Workup to date: none.  Menstrual History: OB History   Grav Para Term Preterm Abortions TAB SAB Ect Mult Living   2 2 2       2        No LMP recorded. Patient is postmenopausal.    The following portions of the patient's history were reviewed and updated as appropriate: allergies, current medications, past family history, past medical history, past social history, past surgical history and problem list.  Review of Systems Pertinent items are noted in HPI.    Objective:    BP 143/74  Pulse 62  Temp(Src) 98.4 F (36.9 C)  Resp 16  Ht 5\' 7"  (1.702 m)  Wt 196 lb (88.905 kg)  BMI 30.69 kg/m2  Filed Vitals:   04/02/13 1359  BP: 143/74  Pulse: 62  Temp: 98.4 F (36.9 C)  Resp: 16  Height: 5\' 7"  (1.702 m)  Weight: 196 lb (88.905 kg)   Vitals:  WNL General appearance: alert, cooperative and no distress Head: Normocephalic, without obvious abnormality, atraumatic Eyes: negative Throat: lips, mucosa, and tongue normal; teeth and gums normal Abdomen: soft, non-tender  Pelvic:  External Genitalia:  Tanner V, no lesion Urethra:  atrohpic Vagina:  Atrophic, no lesion, constricted from atrophy and difficult to open speculum or perform bimanual Cervix:  Scant brown blood coming from os, no lesion Uterus:  Small, non tender Adnexa:  Non tender, difficult to feel due to pain with exam  Extremities: no edema, redness or tenderness in the calves or thighs Skin: evidence of sun exposure    Assessment:    Postmenopausal bleeding   Plan:    Pelvic ultrasound.

## 2013-04-04 ENCOUNTER — Other Ambulatory Visit: Payer: Self-pay | Admitting: Obstetrics & Gynecology

## 2013-04-04 ENCOUNTER — Ambulatory Visit (HOSPITAL_COMMUNITY)
Admission: RE | Admit: 2013-04-04 | Discharge: 2013-04-04 | Disposition: A | Discharge status: Home / Self Care | Payer: Medicare Other | Source: Ambulatory Visit | Attending: Obstetrics & Gynecology | Admitting: Obstetrics & Gynecology

## 2013-04-04 DIAGNOSIS — N950 Postmenopausal bleeding: Secondary | ICD-10-CM

## 2013-04-04 MED ORDER — MISOPROSTOL 200 MCG PO TABS: ORAL_TABLET | ORAL | Status: DC

## 2013-04-05 ENCOUNTER — Telehealth: Payer: Self-pay | Admitting: *Deleted

## 2013-04-05 DIAGNOSIS — Z01812 Encounter for preprocedural laboratory examination: Secondary | ICD-10-CM

## 2013-04-05 MED ORDER — MISOPROSTOL 200 MCG PO TABS: ORAL_TABLET | ORAL | Status: DC

## 2013-04-05 NOTE — Telephone Encounter (Signed)
Message copied by Asencion Islam on Fri Apr 05, 2013  8:53 AM ------      Message from: Guss Bunde      Created: Thu Apr 04, 2013  6:58 PM       Pt needs endometrial biopsy.  cytotec prior to procedure. ------

## 2013-04-05 NOTE — Telephone Encounter (Signed)
Pt notified of TVU and need for Endometrial biopsy.. Cytotec ordered per Dr Gala Romney.

## 2013-04-09 ENCOUNTER — Ambulatory Visit (INDEPENDENT_AMBULATORY_CARE_PROVIDER_SITE_OTHER): Payer: Medicare Other | Admitting: Obstetrics & Gynecology

## 2013-04-09 ENCOUNTER — Encounter: Payer: Self-pay | Admitting: Obstetrics & Gynecology

## 2013-04-09 VITALS — BP 135/66 | HR 55 | Resp 16 | Ht 67.0 in | Wt 196.0 lb

## 2013-04-09 DIAGNOSIS — N950 Postmenopausal bleeding: Secondary | ICD-10-CM

## 2013-04-09 NOTE — Progress Notes (Signed)
Pt presents for endometrial biopsy.  Pt had taken cytotec prior to procedure.  Given capacity of upper 1/3 of vagina, unable to complete endometrial biopsy in office.  Unable to open speculum wide enough to grab anterior lip of cervix. Will schedule in OR for D&C and possible hysteroscopy. Pt to meet with anesthesia regarding plavix Met with family to explain procedure.  Pt has trip to [place] next week and will have procedure done upon return.

## 2013-04-11 ENCOUNTER — Encounter (HOSPITAL_COMMUNITY): Payer: Self-pay | Admitting: Pharmacist

## 2013-04-11 ENCOUNTER — Encounter (HOSPITAL_COMMUNITY): Payer: Self-pay

## 2013-04-11 ENCOUNTER — Telehealth: Payer: Self-pay | Admitting: *Deleted

## 2013-04-11 ENCOUNTER — Encounter (HOSPITAL_COMMUNITY)
Admission: RE | Admit: 2013-04-11 | Discharge: 2013-04-11 | Disposition: A | Discharge status: Home / Self Care | Payer: Medicare Other | Source: Ambulatory Visit | Attending: Obstetrics & Gynecology | Admitting: Obstetrics & Gynecology

## 2013-04-11 DIAGNOSIS — Z01812 Encounter for preprocedural laboratory examination: Secondary | ICD-10-CM | POA: Insufficient documentation

## 2013-04-11 LAB — CBC
HCT: 37 % (ref 36.0–46.0)
Hemoglobin: 12.5 g/dL (ref 12.0–15.0)
MCH: 33.2 pg (ref 26.0–34.0)
MCHC: 33.8 g/dL (ref 30.0–36.0)
MCV: 98.1 fL (ref 78.0–100.0)
Platelets: 214 10*3/uL (ref 150–400)
RBC: 3.77 MIL/uL — AB (ref 3.87–5.11)
RDW: 12.4 % (ref 11.5–15.5)
WBC: 7.6 10*3/uL (ref 4.0–10.5)

## 2013-04-11 LAB — BASIC METABOLIC PANEL
BUN: 9 mg/dL (ref 6–23)
CHLORIDE: 107 meq/L (ref 96–112)
CO2: 28 meq/L (ref 19–32)
CREATININE: 0.86 mg/dL (ref 0.50–1.10)
Calcium: 9.8 mg/dL (ref 8.4–10.5)
GFR calc Af Amer: 73 mL/min — ABNORMAL LOW (ref >90)
GFR calc non Af Amer: 63 mL/min — ABNORMAL LOW (ref >90)
Glucose, Bld: 98 mg/dL (ref 70–99)
Potassium: 4.6 mEq/L (ref 3.7–5.3)
Sodium: 145 mEq/L (ref 137–147)

## 2013-04-11 NOTE — Patient Instructions (Signed)
Your procedure is scheduled on: Tuesday, April 23, 2013  Enter through the [employer] of [facility] at: 8:30 AM  Pick up the phone at the desk and dial [phone].  Call this number if you have problems the morning of surgery: [phone].  Remember: Do NOT eat food: AFTER MIDNIGHT MONDAY Do NOT drink clear liquids after: AFTER MIDNIGHT MONDAY  Take these medicines the morning of surgery with a SIP OF WATER: PREVACID, LOSARTAN  Do NOT wear jewelry (body piercing), make-up, or nail polish. Do NOT wear lotions, powders, or perfumes.  You may wear deoderant. Do NOT shave for 48 hours prior to surgery. Do NOT bring valuables to the hospital. Contacts, dentures, or bridgework may not be worn into surgery. Have a responsible adult drive you home and stay with you for 24 hours after your procedure

## 2013-04-11 NOTE — Telephone Encounter (Signed)
Tamika from pre-op called and wanted to know how long is it safe for the pt to be off of her Plavix and ASA. Please advise.Audelia Hives [place]

## 2013-04-12 NOTE — Telephone Encounter (Signed)
Tamika informed.Michelle [name] [place]

## 2013-04-12 NOTE — Telephone Encounter (Signed)
Can hold for 3 days if needed.

## 2013-04-12 NOTE — Pre-Procedure Instructions (Signed)
Michelle [name] from Dr. Barnetta Chapel [place]'s office stated Dr. Madilyn Fireman requested Michelle [name] not be off Plavix greater than 3 days.  I called and instructed Michelle [name] to stop Plavix 04/20/2013.  Michelle [name] verbalized understanding.  Dr. Primitivo Gauze made aware.  Attempted to notify Dr. Roseanne Kaufman but office was closed will follow-up with [name] next week.

## 2013-04-15 ENCOUNTER — Encounter: Payer: Self-pay | Admitting: Family Medicine

## 2013-04-15 NOTE — Progress Notes (Signed)
Spoke with Larene Beach at Dr. Tomasa Rand office.  Informed her that Dr. Beatrice Lecher does not want patient to be off Plavix for more than 3 days.  Dr. Gala Romney will call OR scheduling and patient if there is a problem with this or if surgery is to be cancelled.

## 2013-04-18 ENCOUNTER — Telehealth: Payer: Self-pay | Admitting: *Deleted

## 2013-04-18 NOTE — Telephone Encounter (Signed)
LM on both pt and her daughter's cell phone that she needs to stop her Plavix and ASA 5 days prior to her surgery.  I Ask that pt call the office to confirm that she did receive the message.

## 2013-04-22 ENCOUNTER — Telehealth: Payer: Self-pay | Admitting: *Deleted

## 2013-04-22 DIAGNOSIS — Z842 Family history of other diseases of the genitourinary system: Secondary | ICD-10-CM

## 2013-04-22 MED ORDER — MISOPROSTOL 200 MCG PO TABS: ORAL_TABLET | ORAL | Status: DC

## 2013-04-22 MED ORDER — MISOPROSTOL 200 MCG PO TABS
ORAL_TABLET | ORAL | Status: DC
Start: 2013-04-22 — End: 2013-04-30

## 2013-04-22 NOTE — Telephone Encounter (Signed)
Spoke with pt's husband and explained that she has a RX at her pharmacy for Cytotec to take the night prior to surgery.

## 2013-04-23 SURGERY — Surgical Case
Anesthesia: *Unknown

## 2013-04-24 ENCOUNTER — Encounter (HOSPITAL_COMMUNITY)
Admission: RE | Disposition: A | Discharge status: Home / Self Care | Payer: Self-pay | Source: Ambulatory Visit | Attending: Obstetrics & Gynecology

## 2013-04-24 ENCOUNTER — Encounter (HOSPITAL_COMMUNITY): Payer: Self-pay | Admitting: Anesthesiology

## 2013-04-24 ENCOUNTER — Ambulatory Visit (HOSPITAL_COMMUNITY)
Admission: RE | Admit: 2013-04-24 | Discharge: 2013-04-24 | Disposition: A | Discharge status: Home / Self Care | Payer: Medicare Other | Source: Ambulatory Visit | Attending: Obstetrics & Gynecology | Admitting: Obstetrics & Gynecology

## 2013-04-24 ENCOUNTER — Ambulatory Visit (HOSPITAL_COMMUNITY): Payer: Medicare Other | Admitting: Anesthesiology

## 2013-04-24 ENCOUNTER — Encounter (HOSPITAL_COMMUNITY): Payer: Medicare Other | Admitting: Anesthesiology

## 2013-04-24 DIAGNOSIS — Z87891 Personal history of nicotine dependence: Secondary | ICD-10-CM | POA: Insufficient documentation

## 2013-04-24 DIAGNOSIS — I10 Essential (primary) hypertension: Secondary | ICD-10-CM | POA: Insufficient documentation

## 2013-04-24 DIAGNOSIS — N854 Malposition of uterus: Secondary | ICD-10-CM | POA: Insufficient documentation

## 2013-04-24 DIAGNOSIS — E785 Hyperlipidemia, unspecified: Secondary | ICD-10-CM | POA: Insufficient documentation

## 2013-04-24 DIAGNOSIS — K219 Gastro-esophageal reflux disease without esophagitis: Secondary | ICD-10-CM | POA: Insufficient documentation

## 2013-04-24 DIAGNOSIS — R9389 Abnormal findings on diagnostic imaging of other specified body structures: Secondary | ICD-10-CM | POA: Insufficient documentation

## 2013-04-24 DIAGNOSIS — N950 Postmenopausal bleeding: Secondary | ICD-10-CM | POA: Insufficient documentation

## 2013-04-24 DIAGNOSIS — Z8673 Personal history of transient ischemic attack (TIA), and cerebral infarction without residual deficits: Secondary | ICD-10-CM | POA: Insufficient documentation

## 2013-04-24 HISTORY — PX: HYSTEROSCOPY: SHX211

## 2013-04-24 HISTORY — PX: DILATION AND CURETTAGE OF UTERUS: SHX78

## 2013-04-24 SURGERY — DILATION AND CURETTAGE
Anesthesia: Monitor Anesthesia Care | Site: Vagina

## 2013-04-24 MED ORDER — LIDOCAINE HCL 1 % IJ SOLN
INTRAMUSCULAR | Status: DC | PRN
  Administered 2013-04-24: 10 mL

## 2013-04-24 MED ORDER — FENTANYL CITRATE 0.05 MG/ML IJ SOLN: 25.0000 ug | INTRAMUSCULAR | Status: DC | PRN

## 2013-04-24 MED ORDER — LIDOCAINE HCL (CARDIAC) 20 MG/ML IV SOLN
INTRAVENOUS | Status: DC | PRN
  Administered 2013-04-24: 40 mg via INTRAVENOUS

## 2013-04-24 MED ORDER — LIDOCAINE HCL 1 % IJ SOLN
INTRAMUSCULAR | Status: AC
  Filled 2013-04-24: qty 20

## 2013-04-24 MED ORDER — MEPERIDINE HCL 25 MG/ML IJ SOLN: 6.2500 mg | INTRAMUSCULAR | Status: DC | PRN

## 2013-04-24 MED ORDER — SODIUM CHLORIDE 0.9 % IR SOLN
Status: DC | PRN
  Administered 2013-04-24: 3000 mL

## 2013-04-24 MED ORDER — LACTATED RINGERS IV SOLN
INTRAVENOUS | Status: DC
  Administered 2013-04-24 (×2): via INTRAVENOUS

## 2013-04-24 MED ORDER — LIDOCAINE HCL (CARDIAC) 20 MG/ML IV SOLN
INTRAVENOUS | Status: AC
  Filled 2013-04-24: qty 5

## 2013-04-24 MED ORDER — FENTANYL CITRATE 0.05 MG/ML IJ SOLN
INTRAMUSCULAR | Status: DC | PRN
  Administered 2013-04-24: 25 ug via INTRAVENOUS
  Administered 2013-04-24: 50 ug via INTRAVENOUS
  Administered 2013-04-24: 25 ug via INTRAVENOUS

## 2013-04-24 MED ORDER — ACETAMINOPHEN 500 MG PO TABS: 1000.0000 mg | ORAL_TABLET | Freq: Four times a day (QID) | ORAL | Status: DC | PRN

## 2013-04-24 MED ORDER — ONDANSETRON HCL 4 MG/2ML IJ SOLN
INTRAMUSCULAR | Status: AC
  Filled 2013-04-24: qty 2

## 2013-04-24 MED ORDER — METOCLOPRAMIDE HCL 5 MG/ML IJ SOLN: 10.0000 mg | Freq: Once | INTRAMUSCULAR | Status: DC | PRN

## 2013-04-24 MED ORDER — FENTANYL CITRATE 0.05 MG/ML IJ SOLN
INTRAMUSCULAR | Status: AC
  Filled 2013-04-24: qty 2

## 2013-04-24 MED ORDER — PROPOFOL INFUSION 10 MG/ML OPTIME
INTRAVENOUS | Status: DC | PRN
  Administered 2013-04-24: 200 ug/kg/min via INTRAVENOUS

## 2013-04-24 SURGICAL SUPPLY — 17 items
CATH ROBINSON RED A/P 16FR (CATHETERS) ×3 IMPLANT
CLOTH BEACON ORANGE TIMEOUT ST (SAFETY) ×3 IMPLANT
CONTAINER PREFILL 10% NBF 60ML (FORM) ×2 IMPLANT
DRSG TELFA 3X8 NADH (GAUZE/BANDAGES/DRESSINGS) ×3 IMPLANT
GLOVE BIO SURGEON STRL SZ7 (GLOVE) ×3 IMPLANT
GLOVE BIOGEL PI IND STRL 7.0 (GLOVE) ×1 IMPLANT
GLOVE BIOGEL PI INDICATOR 7.0 (GLOVE) ×2
GOWN STRL REUS W/TWL LRG LVL3 (GOWN DISPOSABLE) ×6 IMPLANT
NDL SPNL 22GX3.5 QUINCKE BK (NEEDLE) ×1 IMPLANT
NEEDLE SPNL 22GX3.5 QUINCKE BK (NEEDLE) ×3 IMPLANT
PACK VAGINAL MINOR WOMEN LF (CUSTOM PROCEDURE TRAY) ×3 IMPLANT
PAD DRESSING TELFA 3X8 NADH (GAUZE/BANDAGES/DRESSINGS) ×1 IMPLANT
PAD OB MATERNITY 4.3X12.25 (PERSONAL CARE ITEMS) ×3 IMPLANT
PAD PREP 24X48 CUFFED NSTRL (MISCELLANEOUS) ×3 IMPLANT
PIPET BIOPSY ENDOMETRIAL 3MM (SUCTIONS) ×2 IMPLANT
SYR CONTROL 10ML LL (SYRINGE) ×3 IMPLANT
TOWEL OR 17X24 6PK STRL BLUE (TOWEL DISPOSABLE) ×6 IMPLANT

## 2013-04-24 NOTE — Transfer of Care (Signed)
Immediate Anesthesia Transfer of Care Note  Patient: Michelle [name]  Procedure(s) Performed: Procedure(s): DILATATION AND CURETTAGE (N/A) HYSTEROSCOPY (N/A)  Patient Location: PACU  Anesthesia Type:MAC  Level of Consciousness: awake  Airway & Oxygen Therapy: Patient Spontanous Breathing  Post-op Assessment: Report given to PACU RN and Post -op Vital signs reviewed and stable  Post vital signs: stable  Complications: No apparent anesthesia complications

## 2013-04-24 NOTE — Anesthesia Preprocedure Evaluation (Signed)
Anesthesia Evaluation  Patient identified by MRN, date of birth, ID band Patient awake    Reviewed: Allergy & Precautions, H&P , NPO status , Patient's Chart, lab work & pertinent test results  Airway Mallampati: III TM Distance: >3 FB Neck ROM: Full    Dental no notable dental hx. (+) Teeth Intact   Pulmonary former smoker,  30 pack years breath sounds clear to auscultation  Pulmonary exam normal       Cardiovascular hypertension, Pt. on medications Rhythm:Regular Rate:Normal     Neuro/Psych  Neuromuscular disease CVA, No Residual Symptoms negative psych ROS   GI/Hepatic Neg liver ROS, hiatal hernia, GERD-  Medicated and Controlled,  Endo/Other  Hyperlipidemia Hx/o Breast Ca  Renal/GU negative Renal ROS  negative genitourinary   Musculoskeletal negative musculoskeletal ROS (+)   Abdominal   Peds  Hematology negative hematology ROS (+) On Plavix- Last dose 3/21   Anesthesia Other Findings   Reproductive/Obstetrics Post Menopausal bleeding                           Anesthesia Physical Anesthesia Plan  ASA: III  Anesthesia Plan: MAC   Post-op Pain Management:    Induction: Intravenous  Airway Management Planned: Natural Airway, Nasal Cannula and Simple Face Mask  Additional Equipment:   Intra-op Plan:   Post-operative Plan:   Informed Consent: I have reviewed the patients History and Physical, chart, labs and discussed the procedure including the risks, benefits and alternatives for the proposed anesthesia with the patient or authorized representative who has indicated his/her understanding and acceptance.     Plan Discussed with: Anesthesiologist, CRNA and Surgeon  Anesthesia Plan Comments:         Anesthesia Quick Evaluation

## 2013-04-24 NOTE — Anesthesia Postprocedure Evaluation (Signed)
Anesthesia Post Note  Patient: Michelle [name]  Procedure(s) Performed: Procedure(s) (LRB): DILATATION AND CURETTAGE (N/A) HYSTEROSCOPY (N/A)  Anesthesia type: General  Patient location: PACU  Post pain: Pain level controlled  Post assessment: Post-op Vital signs reviewed  Last Vitals:  Filed Vitals:   04/24/13 1700  BP: 129/55  Pulse: 49  Temp:   Resp: 16    Post vital signs: Reviewed  Level of consciousness: sedated  Complications: No apparent anesthesia complications

## 2013-04-24 NOTE — Op Note (Signed)
PREOPERATIVE DIAGNOSIS:  80 y.o. with postmenopausal bleeding  POSTOPERATIVE DIAGNOSIS: The same  PROCEDURE: Hysteroscopy, Dilation and Curettage.  SURGEON:  Dr. Silas [place]  INDICATIONS: 80 y.o. [id number]  here for scheduled surgery for postmenopausal bleeding.   Risks of surgery were discussed with the patient including but not limited to: bleeding which may require transfusion; infection which may require antibiotics; injury to uterus or surrounding organs; need for additional procedures including laparotomy or laparoscopy; and other postoperative/anesthesia complications. Written informed consent was obtained.    FINDINGS:  A small anteverted uterus.  Small amount of proliferative tissue at the junction of uterus to cervix, mucous in endocervical canal.  Normal ostia bilaterally.  ANESTHESIA:   MAC, paracervical block  ESTIMATED BLOOD LOSS:  Less than 20 ml  SPECIMENS: Endometrial curettings sent to pathology  COMPLICATIONS:  None immediate.  PROCEDURE DETAILS:  The patient received intravenous antibiotics while in the preoperative area.  She was then taken to the operating room where MAC anesthesia was administered and was found to be adequate.  After an adequate timeout was performed, she was placed in the dorsal lithotomy position and examined; then prepped and draped in the sterile manner.   Her bladder was catheterized for an unmeasured amount of clear, yellow urine. A Pederson speculum was then placed in the patient's vagina and a single tooth tenaculum was applied to the anterior lip of the cervix.   A paracervical block using 10 cc 1% lidocaine was administered.  The uteus was sounded to 8 cm and dilated manually with metal dilators to accommodate the diagnostic hysteroscope.  Once the cervix was dilated, the hysteroscope was inserted under direct visualization using saline as a suspension medium.  The uterine cavity was carefully examined, both ostia were recognized.   After further  careful visualization of the uterine cavity, the hysteroscope was removed under direct visualization.  A sharp curettage was then performed to obtain a small amount of endometrial curettings.  The tenaculum was removed from the anterior lip of the cervix and the vaginal speculum was removed after noting good hemostasis.  The patient tolerated the procedure well and was taken to the recovery area awake and in stable condition.

## 2013-04-24 NOTE — Discharge Instructions (Signed)

## 2013-04-24 NOTE — H&P (Signed)
Michelle [name] is an 80 y.o. female with post menopausal bleeding.  TVUS showed a thickened endometrium.  Attempt at endometrial biopsy was not successfult due to vagian stenosis adn pain.  Attempt under MAC and local in the OR to be done today.  Pt has taken cytotec and [place] is on standby.  Pertinent Gynecological History: Menses: menopausal approx 30 years Bleeding: spotting since November 2014 Contraception: none DES exposure: denies Hisotry of breast cancer and mammograms followed by PCP / general surgery.   Menstrual History: No LMP recorded. Patient is postmenopausal.    Past Medical History  Diagnosis Date  . GERD (gastroesophageal reflux disease)   . Hyperlipidemia   . Shingles 2005  . Hypertension   . History of cerebrovascular accident   . Bell's palsy 12/84 (approx)  . Cancer 10/28/2009    squamous cell - upper right arm  . Cancer 10/28/09    basal cell -upper right back  . Change in vision   . Stroke 09/2007  . Hiatal hernia 2008  . Bradycardia   . Polyp of colon   . Cataract   . Macular degeneration   . Vocal cord polyp   . Whooping cough   . Measles   . Mumps     Past Surgical History  Procedure Laterality Date  . Cholecystectomy  2004  . Biospy  10/28/09    Shave Biospy skin Left hand(Acitinic Keratoses), Right Upper Arm (superficial Basal Cell), Right Upper Back(Superficial Basal Cell), Left Neck ( Solar Lentigo & Seborrheic Keratoses)  . Vocal cord poylps  1969    excision  . Tonsillectomy    . Colonoscopy    . Breast mammosite  03/14/2011    Procedure: MAMMOSITE BREAST;  Surgeon: Rolm Bookbinder, MD;  Location: [place];  Service: General;  Laterality: Right;  . Breast biopsy  02/11/11    Right Breast Needle Core Biospy - Upper Outer Quadrant; ER/PR 100%, Her-2 Neu neg.; Ki-67 10%  . Breast surgery      right breast lumpectomy snbx  . Basal cell carcinoma excision    . Squamous cell carcinoma excision    . Colonoscopy w/ polypectomy     . Tear duct probing      Family History  Problem Relation Age of Onset  . Cancer Sister 64    Lymphoma  . Cancer Paternal Aunt     Breast cancer  . Cancer Daughter 59    Bilateral Breast Cancer  . Cancer Daughter     Breast Cancer  . Cancer Sister     2005 - Renal Cell Cancer and Breast Cancer  . Heart disease Father     Social History:  reports that she quit smoking about 17 years ago. She has never used smokeless tobacco. She reports that she drinks alcohol. She reports that she does not use illicit drugs.  Allergies:  Allergies  Allergen Reactions  . Benadryl [Diphenhydramine Hcl]     Hyper  . Doxycycline Other (See Comments)    acid reflux  . Simvastatin Other (See Comments)    REACTION: memory loss  . Lisinopril Other (See Comments)    Dry cough    Prescriptions prior to admission  Medication Sig Dispense Refill  . aspirin 325 MG tablet Take 325 mg by mouth at bedtime.        . clopidogrel (PLAVIX) 75 MG tablet TAKE 1 TABLET (75 MG TOTAL) BY MOUTH DAILY.  30 tablet  4  . fluticasone (FLONASE) 50  MCG/ACT nasal spray SPRITZ 2 SPRAYS INTO EACH NOSTRIL DAILY  16 g  3  . lansoprazole (PREVACID) 30 MG capsule Take 30 mg by mouth daily with supper.       . losartan (COZAAR) 25 MG tablet TAKE 1 TABLET (25 MG TOTAL) BY MOUTH DAILY.  30 tablet  6  . misoprostol (CYTOTEC) 200 MCG tablet Place 3 tabs into vaginal the night prior to procedure  3 tablet  0  . rosuvastatin (CRESTOR) 10 MG tablet Take 10 mg by mouth every other day.        Review of Systems  Constitutional: Negative.   Respiratory: Negative.   Cardiovascular: Negative.   Gastrointestinal: Negative.   Genitourinary:       Vaginal spotting  Neurological: Negative.   Psychiatric/Behavioral: Negative.     Blood pressure 134/73, pulse 58, temperature 97.9 F (36.6 C), temperature source Oral, resp. rate 18, SpO2 100.00%. Physical Exam  Vitals reviewed. Constitutional: She is oriented to person, place, and  time. She appears well-developed and well-nourished. No distress.  HENT:  Head: Normocephalic and atraumatic.  Eyes: Conjunctivae are normal.  Neck: Normal range of motion. Neck supple. No thyromegaly present.  Cardiovascular: Normal rate and regular rhythm.   Respiratory: Effort normal and breath sounds normal.  GI: Soft. She exhibits no distension and no mass. There is no tenderness. There is no rebound and no guarding.  Genitourinary:  Pelvic to repeated in the OR.  Musculoskeletal: She exhibits no tenderness.  Neurological: She is alert and oriented to person, place, and time.  Skin: Skin is warm and dry.    No results found for this or any previous visit (from the past 24 hour(s)).  No results found.  Assessment/Plan: 80 yo female with PMB for endometrial biopsy and D&C Hysteroscopy if able to dilate easily.  Pt has been off her plavix and aspirin for 5 days per heme and PCP recommendations.  Will restart tomorrow.  Plan is for MAC and local.  [place] on stand by if having problems entering endometrial cavity.    Michelle [name] H. 04/24/2013, 3:22 PM

## 2013-04-25 ENCOUNTER — Encounter (HOSPITAL_COMMUNITY): Payer: Self-pay | Admitting: Obstetrics & Gynecology

## 2013-04-29 ENCOUNTER — Telehealth: Payer: Self-pay | Admitting: *Deleted

## 2013-04-29 NOTE — Telephone Encounter (Signed)
Spoke with husband left message for pt to call office for an appt.

## 2013-04-30 ENCOUNTER — Encounter: Payer: Self-pay | Admitting: Obstetrics & Gynecology

## 2013-04-30 ENCOUNTER — Ambulatory Visit (INDEPENDENT_AMBULATORY_CARE_PROVIDER_SITE_OTHER): Payer: Medicare Other | Admitting: Obstetrics & Gynecology

## 2013-04-30 DIAGNOSIS — Z8542 Personal history of malignant neoplasm of other parts of uterus: Secondary | ICD-10-CM | POA: Insufficient documentation

## 2013-04-30 DIAGNOSIS — C541 Malignant neoplasm of endometrium: Secondary | ICD-10-CM

## 2013-04-30 DIAGNOSIS — C549 Malignant neoplasm of corpus uteri, unspecified: Secondary | ICD-10-CM

## 2013-04-30 HISTORY — DX: Personal history of malignant neoplasm of other parts of uterus: Z85.42

## 2013-04-30 IMAGING — CR DG CHEST 2V
2 series · 2 of 2 positions shown · non-contrast
Comparison: None.

CLINICAL DATA: Preop for lumpectomy, cough, hypertension, former
smoking history

CHEST - 2 VIEW

[view not recorded (1 of 2)]
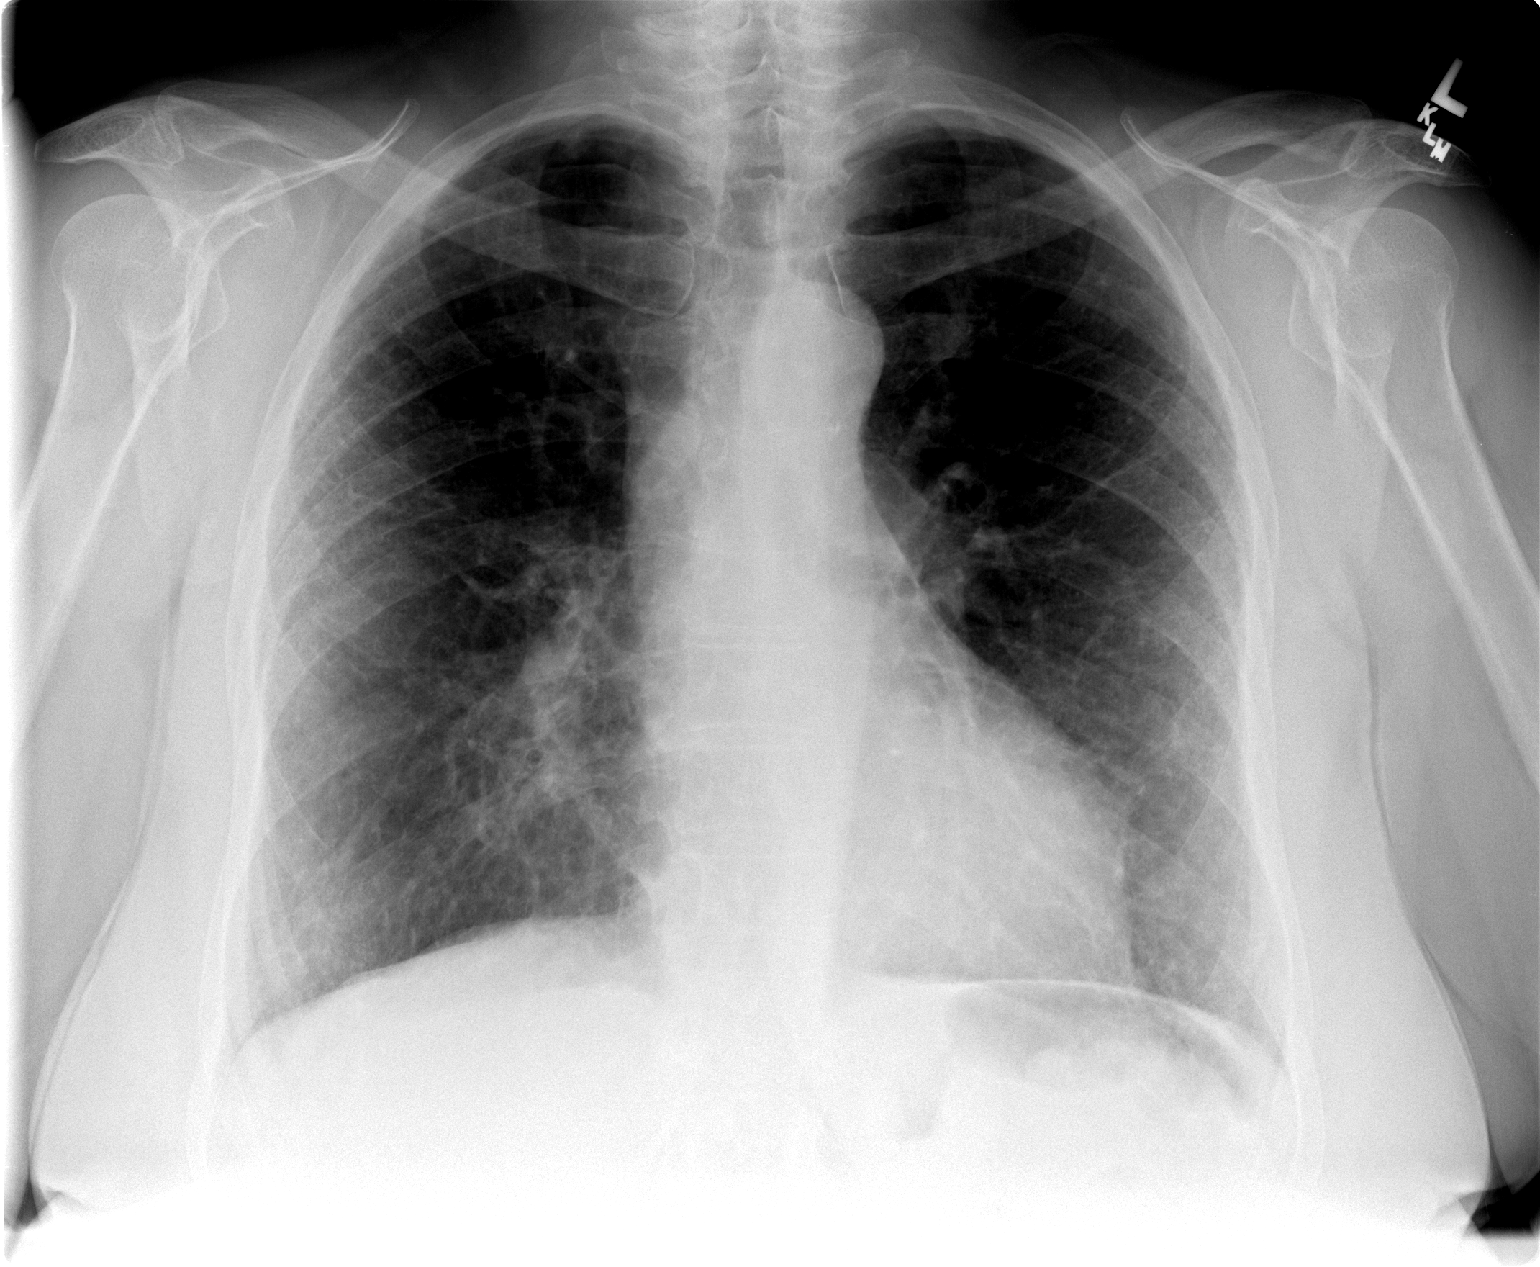

[view not recorded (2 of 2)]
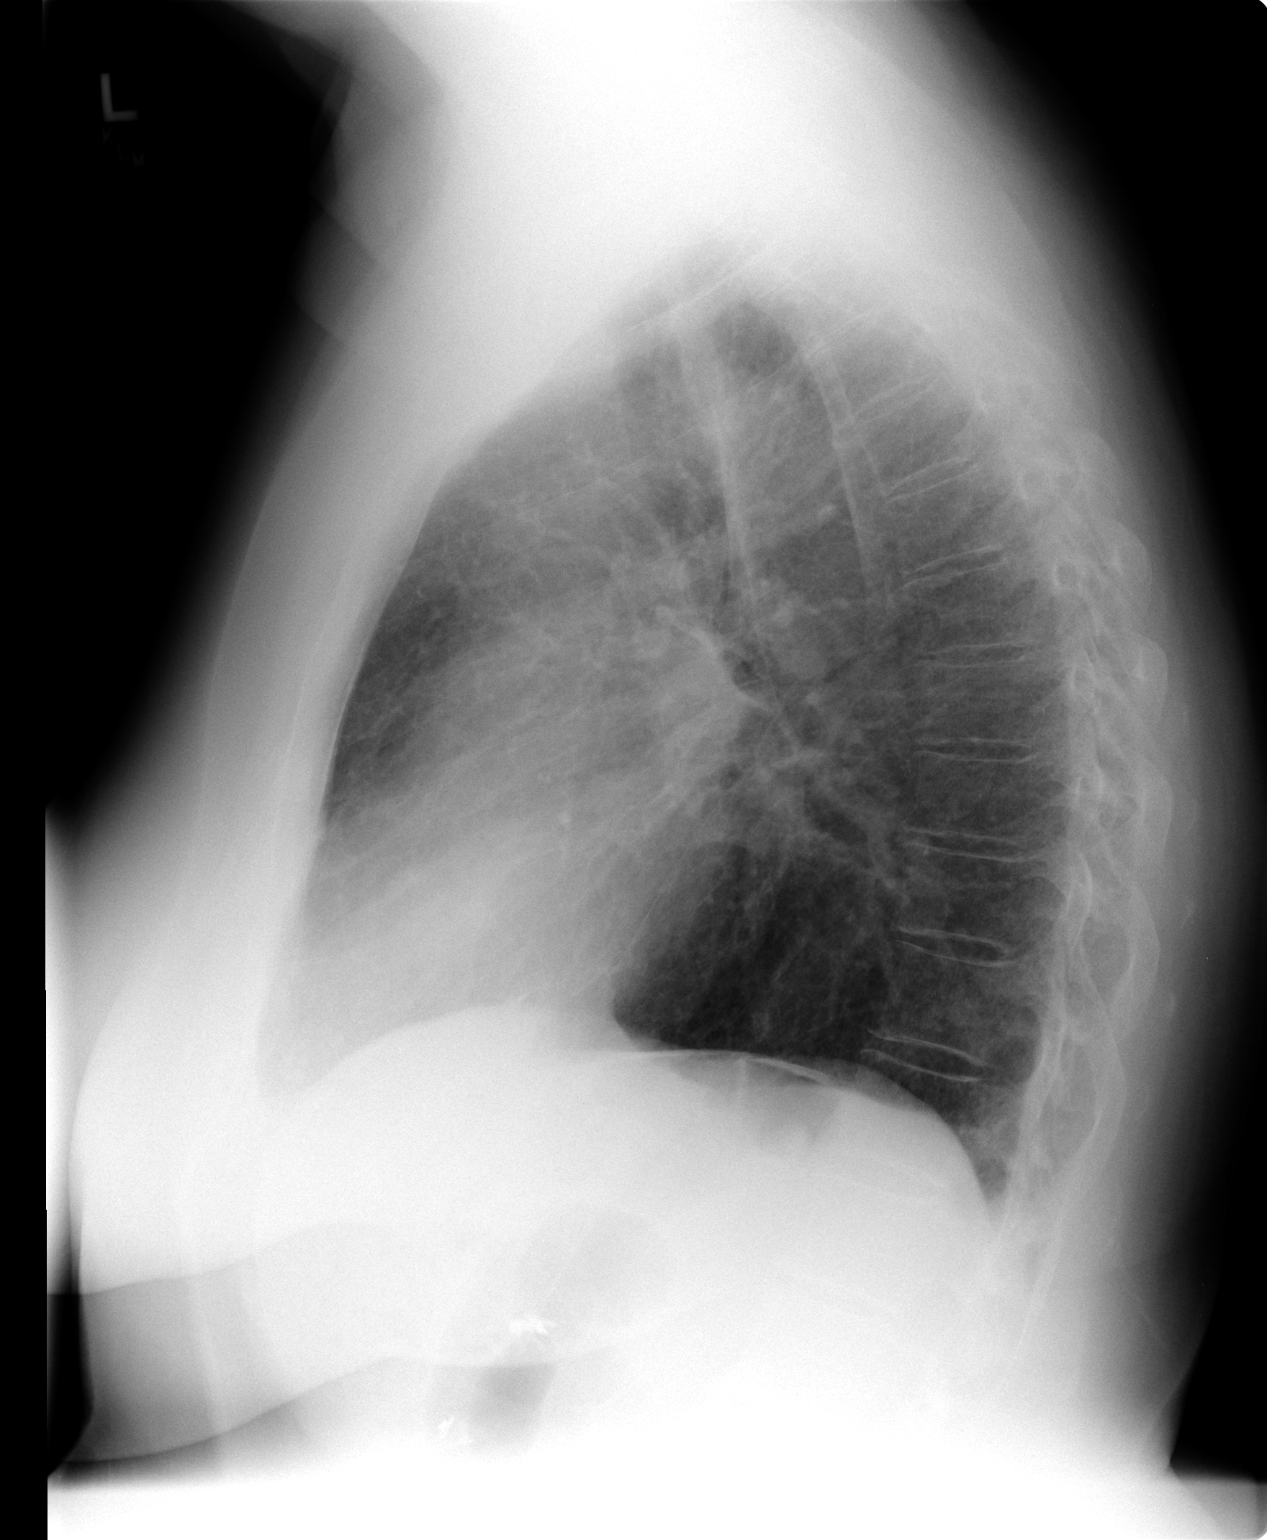

[2 of 2 positions shown; findings below may reference images not displayed]

FINDINGS: No infiltrate or effusion is seen.  There is a small
nodular opacity in the right lung apex which may represent
scarring, but comparison with prior or follow-up chest x-ray is
recommended.  Somewhat prominent interstitial markings are present
most likely chronic in nature.  Mediastinal contours appear normal.
The heart is within upper limits of normal.  No bony abnormality is
seen.  Surgical clips are present in the upper abdomen presumably
due to prior cholecystectomy.
IMPRESSION: 1.  No active lung disease.  Chronic changes bilaterally.
2.  Small nodular opacity in the right lung apex may represent
pleural thickening.  Compare with prior or follow-up chest x-ray to
assess stability.

## 2013-04-30 NOTE — Progress Notes (Signed)
Pt presents for results from D & C last week.  Path report shows FIGO1 adenocarinoma of the endometrium.  Gave brief description of surgical staging and adjuvant therapy.  Pt has appt with [facility]ologists on April 22nd.  Pt given handouts from up to date.  All questions answered.  Family (3 daughters and husband) present and their questions were answered.  One daughter used to be a Marine scientist on the cancer center in [place] and is familiar with [place].    25 mins spent face to face with patient and family.  >50% counseling.

## 2013-05-08 ENCOUNTER — Encounter: Payer: Medicare Other | Admitting: Family Medicine

## 2013-05-14 ENCOUNTER — Ambulatory Visit (INDEPENDENT_AMBULATORY_CARE_PROVIDER_SITE_OTHER): Payer: Medicare Other | Admitting: Family Medicine

## 2013-05-14 ENCOUNTER — Encounter: Payer: Self-pay | Admitting: Family Medicine

## 2013-05-14 VITALS — BP 132/73 | HR 60 | Wt 196.0 lb

## 2013-05-14 DIAGNOSIS — Z8673 Personal history of transient ischemic attack (TIA), and cerebral infarction without residual deficits: Secondary | ICD-10-CM

## 2013-05-14 DIAGNOSIS — C541 Malignant neoplasm of endometrium: Secondary | ICD-10-CM

## 2013-05-14 DIAGNOSIS — I10 Essential (primary) hypertension: Secondary | ICD-10-CM

## 2013-05-14 DIAGNOSIS — C549 Malignant neoplasm of corpus uteri, unspecified: Secondary | ICD-10-CM

## 2013-05-14 LAB — BASIC METABOLIC PANEL WITH GFR
BUN: 12 mg/dL (ref 6–23)
CALCIUM: 9.7 mg/dL (ref 8.4–10.5)
CHLORIDE: 103 meq/L (ref 96–112)
CO2: 28 mEq/L (ref 19–32)
CREATININE: 0.86 mg/dL (ref 0.50–1.10)
GFR, EST AFRICAN AMERICAN: 75 mL/min
GFR, Est Non African American: 65 mL/min
Glucose, Bld: 71 mg/dL (ref 70–99)
Potassium: 4.4 mEq/L (ref 3.5–5.3)
Sodium: 139 mEq/L (ref 135–145)

## 2013-05-14 NOTE — Progress Notes (Signed)
   Subjective:    Patient ID: Michelle [name], female    DOB: [birth year], 80 y.o.   MRN: [id number]  HPI Hypertension- Pt denies chest pain, SOB, dizziness, or heart palpitations.  Taking meds as directed w/o problems.  Denies medication side effects.    Recently diagnosed with endometrial cancer. She has an appointment with gynecology oncology on April 22 with Ned Clines  History stroke-overall she's doing well. She's not had any recent symptoms. She is concerned that her son may be causing some memory loss. She was originally on statin and stopped it and did better. She was then started on Crestor but was not told it was a statin. She is now taking it every other day but still notices problems with short-term memory such as remembering names and stories. She's not having significant difficulty this causing impairment such as getting in the car and forgetting where she's going et Ronney Asters.  Review of Systems     Objective:   Physical Exam  Constitutional: She is oriented to person, place, and time. She appears well-developed and well-nourished.  HENT:  Head: Normocephalic and atraumatic.  Cardiovascular: Normal rate, regular rhythm and normal heart sounds.   No carotid bruits   Pulmonary/Chest: Effort normal and breath sounds normal.  Neurological: She is alert and oriented to person, place, and time.  Skin: Skin is warm and dry.  Psychiatric: She has a normal mood and affect. Her behavior is normal.          Assessment & Plan:  HTN - well controlled. Continue current regimen. F/u in 6 months.   Endometrial cancer - she is nervous about her appointment coming up and jsut wants to get in ASAP and find out the plan. Does have hx of breast cancer.   CVA - Still on her ASA and plavix and doing well. No recent sxs. Discussed the importance of staying on a statin. She does feel like she gets some mild memory impairment with it. We discussed the pros and cons. Certain she can think about  it I think overall the risk reduction because of her history of stroke is very important. Certainly we could consider stopping medication for short period time just to make sure that it's the statin that is causing the memory loss. She can think about it and let me know she would like today.

## 2013-05-15 NOTE — Progress Notes (Signed)
Quick Note:  All labs are normal. ______ 

## 2013-05-20 NOTE — Telephone Encounter (Signed)
Please see Visit Info comments 

## 2013-05-21 ENCOUNTER — Other Ambulatory Visit: Payer: Self-pay | Admitting: Family Medicine

## 2013-05-22 ENCOUNTER — Ambulatory Visit: Payer: Medicare Other | Attending: Gynecologic Oncology | Admitting: Gynecologic Oncology

## 2013-05-22 ENCOUNTER — Encounter: Payer: Self-pay | Admitting: Gynecologic Oncology

## 2013-05-22 VITALS — BP 147/73 | HR 52 | Temp 97.6°F | Resp 20 | Ht 67.0 in | Wt 195.8 lb

## 2013-05-22 DIAGNOSIS — C541 Malignant neoplasm of endometrium: Secondary | ICD-10-CM

## 2013-05-22 DIAGNOSIS — C549 Malignant neoplasm of corpus uteri, unspecified: Secondary | ICD-10-CM | POA: Insufficient documentation

## 2013-05-22 NOTE — Patient Instructions (Addendum)
Plan for surgery with Dr. Janie Morning on May 12 at [facility].  Dr. Madilyn Fireman will provide recommendations about your plavix and aspirin prior to surgery.                Preparing for your Surgery  Pre-operative Testing -You will receive a phone call from presurgical testing at [facility] to arrange for a pre-operative testing appointment before your surgery.  This appointment normally occurs one to two weeks before your scheduled surgery.   -Bring your insurance card, copy of an advanced directive if applicable, medication list  -At that visit, you will be asked to sign a consent for a possible blood transfusion in case a transfusion becomes necessary during surgery.  The need for a blood transfusion is rare but having consent is a necessary part of your care.     Day Before Surgery at [place] will be asked to take in only clear liquids the day before surgery.  Examples of clear liquids include broths, jello, and clear juices.  You will be advised to have nothing to eat or drink after midnight the evening before.    Your role in recovery Your role is to become active as soon as directed by your doctor, while still giving yourself time to heal.  Rest when you feel tired. You will be asked to do the following in order to speed your recovery:  - Cough and breathe deeply. This helps toclear and expand your lungs and can prevent pneumonia. You may be given a spirometer to practice deep breathing. A staff member will show you how to use the spirometer. - Do mild physical activity. Walking or moving your legs help your circulation and body functions return to normal. A staff member will help you when you try to walk and will provide you with simple exercises. Do not try to get up or walk alone the first time. - Actively manage your pain. Managing your pain lets you move in comfort. We will ask you to rate your pain on a scale of zero to 10. It is your responsibility to tell your  doctor or nurse where and how much you hurt so your pain can be treated.  Special Considerations -If you are diabetic, you may be placed on insulin after surgery to have closer control over your blood sugars to promote healing and recovery.  This does not mean that you will be discharged on insulin.  If applicable, your oral antidiabetics will be resumed when you are tolerating a solid diet.  -Your final pathology results from surgery should be available by the Friday after surgery and the results will be relayed to you when available.  Total Laparoscopic Hysterectomy, Care After Refer to this sheet in the next few weeks. These instructions provide you with information on caring for yourself after your procedure. Your health care provider may also give you more specific instructions. Your treatment has been planned according to current medical practices, but problems sometimes occur. Call your health care provider if you have any problems or questions after your procedure. WHAT TO EXPECT AFTER THE PROCEDURE  Pain and bruising at the incision sites. You will be given pain medicine to control it.  Menopausal symptoms such as hot flashes, night sweats, and insomnia if your ovaries were removed.  Sore throat from the breathing tube that was inserted during surgery. HOME CARE INSTRUCTIONS  Only take over-the-counter or prescription medicines for pain, discomfort, or fever as directed by your  health care provider.   Do not take aspirin. It can cause bleeding.   Do not drive when taking pain medicine.   Follow your health care provider's advice regarding diet, exercise, lifting, driving, and general activities.   Resume your usual diet as directed and allowed.   Get plenty of rest and sleep.   Do not douche, use tampons, or have sexual intercourse for at least 6 weeks, or until your health care provider gives you permission.   Change your bandages (dressings) as directed by your  health care provider.   Monitor your temperature and notify your health care provider of a fever.   Take showers instead of baths for 2 3 weeks.   Do not drink alcohol until your health care provider gives you permission.   If you develop constipation, you may take a mild laxative with your health care provider's permission. Bran foods may help with constipation problems. Drinking enough fluids to keep your urine clear or pale yellow may help as well.   Try to have someone home with you for 1 2 weeks to help around the house.   Keep all of your follow-up appointments as directed by your health care provider.  SEEK MEDICAL CARE IF:  You have swelling, redness, or increasing pain around your incision sites.   You have pus coming from your incision.   You notice a bad smell coming from your incision.   Your incision breaks open.   You feel dizzy or lightheaded.   You have pain or bleeding when you urinate.   You have persistent diarrhea.   You have persistent nausea and vomiting.   You have abnormal vaginal discharge.   You have a rash.   You have any type of abnormal reaction or develop an allergy to your medicine.   You have poor pain control with your prescribed medicine.  SEEK IMMEDIATE MEDICAL CARE IF:  You have chest pain or shortness of breath.  You have severe abdominal pain that is not relieved with pain medicine.  You have pain or swelling in your legs. Document Released: 11/07/2012 Document Reviewed: 08/07/2012 [facility] Patient Information 2014 [place], [place]. Total Laparoscopic Hysterectomy A total laparoscopic hysterectomy is a minimally invasive surgery to remove your uterus and cervix. This surgery is performed by making several small cuts (incisions) in your abdomen. It can also be done with a thin, lighted tube (laparoscope) inserted into two small incisions in your lower abdomen. Your fallopian tubes and ovaries can be removed  (bilateral salpingo-oophorectomy) during this surgery as well.Benefits of minimally invasive surgery include:  Less pain.  Less risk of blood loss.  Less risk of infection.  Quicker return to normal activities. LET [facility] CARE PROVIDER KNOW ABOUT:  Any allergies you have.  All medicines you are taking, including vitamins, herbs, eye drops, creams, and over-the-counter medicines.  Previous problems you or members of your family have had with the use of anesthetics.  Any blood disorders you have.  Previous surgeries you have had.  Medical conditions you have. RISKS AND COMPLICATIONS  Generally, this is a safe procedure. However, as with any procedure, complications can occur. Possible complications include:  Bleeding.  Blood clots in the legs or lung.  Infection.  Injury to surrounding organs.  Problems with anesthesia.  Early menopause symptoms (hot flashes, night sweats, insomnia).  Risk of conversion to an open abdominal incision. BEFORE THE PROCEDURE  Ask your health care provider about changing or stopping your regular medicines.  Do  not take aspirin or blood thinners (anticoagulants) for 1 week before the surgery or as told by your health care provider.  Do not eat or drink anything for 8 hours before the surgery or as told by your health care provider.  Quit smoking if you smoke.  Arrange for a ride home after surgery and for someone to help you at home during recovery. PROCEDURE   You will be given antibiotic medicine.  An IV tube will be placed in your arm. You will be given medicine to make you sleep (general anesthetic).  A gas (carbon dioxide) will be used to inflate your abdomen. This will allow your surgeon to look inside your abdomen, perform your surgery, and treat any other problems found if necessary.  Three or four small incisions (often less than 1/2 inch) will be made in your abdomen. One of these incisions will be made in the area of  your belly button (navel). The laparoscope will be inserted into the incision. Your surgeon will look through the laparoscope while doing your procedure.  Other surgical instruments will be inserted through the other incisions.  Your uterus may be removed through your vagina or cut into small pieces and removed through the small incisions.  Your incisions will be closed. AFTER THE PROCEDURE  The gas will be released from inside your abdomen.  You will be taken to the recovery area where a nurse will watch and check your progress. Once you are awake, stable, and taking fluids well, without other problems, you will return to your room or be allowed to go home.  There is usually minimal discomfort following the surgery because the incisions are so small.  You will be given pain medicine while you are in the hospital and for when you go home. Document Released: 11/14/2006 Document Revised: 09/19/2012 Document Reviewed: 08/07/2012 [facility] Patient Information 2014 [place], [place]. Uterine Cancer Uterine cancer is an abnormal growth of tissue (tumor) in the uterus that is cancerous (malignant). Unlike noncancerous (benign) tumors, malignant tumors can spread to other parts of your body. The wall of the uterus has two layers of tissue. The inner layer is the endometrium. The outer layer of muscle tissue is the myometrium. The most common type of uterine cancer begins in the endometrium. This is called endometrial cancer. Cancer that begins in the myometrium is called uterine sarcoma, which is very rare.  RISK FACTORS  Although the exact cause of uterine cancer is unknown, there are a number of risk factors that can increase your chances of getting uterine cancer. They include:  Your age. Uterine cancer occurs mostly in women older than 50 years.   Having an enlarged endometrium (endometrial hyperplasia).   Using hormone therapy.   Obesity.   Taking the drug tamoxifen.   White race.    Infertility.   Never being pregnant.   Beginning menstrual periods at an age younger than 12 years.   Having menstrual periods at an age older than 72 years.   Personal history of ovarian, intestinal, or colorectal cancer.   Having a family history of uterine cancer.   Having a family history of hereditary nonpolyposis colon cancer (HNPCC).   Having diabetes, high blood pressure, thyroid disease, or gallbladder disease.   Long-term use of high-dose birth control pills.   Exposure to radiation.   Smoking.  SIGNS AND SYMPTOMS   Abnormal vaginal bleeding or discharge. Bleeding may start as a watery, blood-streaked flow that gradually contains more blood.   Any vaginal  bleeding after menopause.   Difficult or painful urination.   Pain during intercourse.   Pain in the pelvic area.  Mass in the vagina.  Pain or fullness in the abdomen.  Frequent urination.  Bleeding between periods.  Growth of the stomach.   Unexplained weight loss.  Uterine cancer usually occurs after menopause. However, it may also occur around the time that menopause begins. Abnormal vaginal bleeding is the most common symptom of uterine cancer. Women should not assume that abnormal vaginal bleeding is part of menopause. DIAGNOSIS  Your health care provider will ask about your medical history. He or she may also perform a number of procedures, such as:  A physical and pelvic exam. Your health care provider will feel your pelvis for any lumps.   Blood and urine tests.   X-rays.   Imaging tests, such as CT scans, ultrasonography, or MRIs.   A hysteroscopy to view the inside of your uterus.   A Pap test to sample cells from the cervix and upper vagina to check for abnormal cells.   Taking a tissue sample (biopsy) from the uterine lining to look for cancer cells.   A dilation and curettage (D&C). This involves stretching (dilation) the cervix and scraping  (curettage) the inside lining of the uterus to get a tissue sample. The sample is examined under a microscope to look for cancer cells.  Your cancer will be staged to determine its severity and extent. Staging is a careful attempt to find out the size of the tumor, whether the cancer has spread, and if so, to what parts of the body. You may need to have more tests to determine the stage of your cancer. The test results will help determine what treatment plan is best for you. Cancer stages include:   Stage I The cancer is only found in the uterus.  Stage II The cancer has spread to the cervix.  Stage III The cancer has spread outside the uterus, but not outside the pelvis. The cancer may have spread to the lymph nodes in the pelvis.  Stage IV The cancer has spread to other parts of the body, such as the bladder or rectum. TREATMENT  Most women with uterine cancer are treated with surgery. This includes removing the uterus, cervix, fallopian tubes, and ovaries (total hysterectomy). Your lymph nodes near the tumor may also be removed. Some women have radiation, chemotherapy, or hormonal therapy. Other women have a combination of these therapies. HOME CARE INSTRUCTIONS   Only take over-the-counter or prescription medicines as directed by your health care provider.   Maintain a healthy diet.  Exercise regularly.   If you have diabetes, high blood pressure, thyroid disease, or gallbladder disease, follow your health care provider's instructions to keep it under control.   Do not smoke.   Consider joining a support group. This may help you learn to cope with the stress of having uterine cancer.   Seek advice to help you manage treatment side effects.   Keep all follow-up appointments as directed by your health care provider.  SEEK MEDICAL CARE IF:  You have increased stomach or pelvic pain.  You cannot urinate.  You have abnormal bleeding. Document Released: 01/17/2005 Document  Revised: 09/19/2012 Document Reviewed: 07/06/2012 [facility] Patient Information 2014 [place].

## 2013-05-22 NOTE — Progress Notes (Signed)
Consult Note: Gyn-Onc  Michelle [name] 80 y.o. female  CC:  Chief Complaint  Patient presents with  . Endometrial Adeno    New patient    HPI: Patient is seen today in consultation at the request of Dr. Kelly Leggett  Patient is a 80-year-old gravida 2 para 2 has been menopausal for 25+ years. Late in November 2014 she began experiencing a dark vaginal discharge. In January of 2015 she began having some bleeding and spotting. Attempt was made at an endometrial biopsy in the office. However, secondary to her anatomy it could not be performed. She subsequently underwent a D&C on 04/25/2013. Findings are consistent with a grade 1 endometrioid adenocarcinoma. She did have a transvaginal ultrasound on March 5. It revealed the uterus to be 3 x 3.8 x 4.7 cm with no fibroids. There is a thickened endometrial stripe of 1 cm. The right adnexa could not be seen. The left adnexa measured 1.2 x 1.7 x 2.4 cm and was unremarkable.   She's overall doing fairly well. She states that she's had 2-3 episodes of spotting since her D&C. She denies any pain. She's had no change in her bowel or bladder habits. She did have a stroke in August of 2009. She does have some memory loss issues. She's not sure to us because of the stroke her age. She otherwise has no neurologic motor or sensory deficits. She is followed by Dr.Metheney. She is up-to-date on her mammograms. Should a colonoscopy in 2014. Her family history is remarkable that she has a personal history of breast cancer. Her sister also had breast cancer. She has 2 daughters currently aged 56 and 57. One daughter had breast cancer at age of 51. The other daughter had her first breast cancer at age of 38 and a recurrence of 48. They have undergone BRCA testing and they are BRCA negative. One daughter underwent Lynch testing and similarly was negative for that as well.  Review of Systems:  Constitutional: No unintentional weight loss or weight gain. Denies fever. Skin:  No rash Cardiovascular: No chest pain, shortness of breath, or edema. Does experience some shortness going up a steep flight of stairs but recovers well. She is limited in terms of pain in her knees. She knows that she needs to do more exercise. She does her ADLs without difficulty. Pulmonary: No cough or wheeze.  Gastro Intestinal:  No nausea, vomiting, constipation, or diarrhea reported. No bright red blood per rectum or change in bowel movement.  Genitourinary: No frequency, urgency, or dysuria.  Denies vaginal bleeding and discharge.  Musculoskeletal: No myalgia, arthralgia, joint swelling or pain.  Neurologic: No weakness, numbness, or change in gait.  Psychology: Memory decline   Current Meds:  Outpatient Encounter Prescriptions as of 05/22/2013  Medication Sig  . acetaminophen (TYLENOL) 500 MG tablet Take 2 tablets (1,000 mg total) by mouth every 6 (six) hours as needed for moderate pain.  . aspirin 325 MG tablet Take 325 mg by mouth at bedtime.    . clopidogrel (PLAVIX) 75 MG tablet TAKE 1 TABLET (75 MG TOTAL) BY MOUTH DAILY.  . fluticasone (FLONASE) 50 MCG/ACT nasal spray SPRITZ 2 SPRAYS INTO EACH NOSTRIL DAILY  . lansoprazole (PREVACID) 30 MG capsule Take 30 mg by mouth daily with supper.   . losartan (COZAAR) 25 MG tablet TAKE 1 TABLET (25 MG TOTAL) BY MOUTH DAILY.  . rosuvastatin (CRESTOR) 10 MG tablet Take 10 mg by mouth every other day.    Allergy:  Allergies    Allergen Reactions  . Benadryl [Diphenhydramine Hcl]     Hyper  . Doxycycline Other (See Comments)    acid reflux  . Simvastatin Other (See Comments)    REACTION: memory loss  . Lisinopril Other (See Comments)    Dry cough    Social Hx:   History   Social History  . Marital Status: Married    Spouse Name: N/A    Number of Children: N/A  . Years of Education: N/A   Occupational History  . retired    Social History Main Topics  . Smoking status: Former Smoker -- 1.00 packs/day for 30 years    Quit  date: 02/01/1996  . Smokeless tobacco: Never Used  . Alcohol Use: Yes     Comment: rare  . Drug Use: No  . Sexual Activity: Yes    Partners: Male     Comment: Menarche age 15, menopause age 55, HRT x 1year   Other Topics Concern  . Not on file   Social History Narrative  . No narrative on file    Past Surgical Hx:  Past Surgical History  Procedure Laterality Date  . Cholecystectomy  2004  . Biospy  10/28/09    Shave Biospy skin Left hand(Acitinic Keratoses), Right Upper Arm (superficial Basal Cell), Right Upper Back(Superficial Basal Cell), Left Neck ( Solar Lentigo & Seborrheic Keratoses)  . Vocal cord poylps  1969    excision  . Tonsillectomy    . Colonoscopy    . Breast mammosite  03/14/2011    Procedure: MAMMOSITE BREAST;  Surgeon: Matthew Wakefield, MD;  Location: [facility];  Service: General;  Laterality: Right;  . Breast biopsy  02/11/11    Right Breast Needle Core Biospy - Upper Outer Quadrant; ER/PR 100%, Her-2 Neu neg.; Ki-67 10%  . Breast surgery      right breast lumpectomy snbx  . Basal cell carcinoma excision    . Squamous cell carcinoma excision    . Colonoscopy w/ polypectomy    . Tear duct probing    . Dilation and curettage of uterus N/A 04/24/2013    Procedure: DILATATION AND CURETTAGE;  Surgeon: Kelly H Leggett, MD;  Location: [facility];  Service: Gynecology;  Laterality: N/A;  . Hysteroscopy N/A 04/24/2013    Procedure: HYSTEROSCOPY;  Surgeon: Kelly H Leggett, MD;  Location: [facility];  Service: Gynecology;  Laterality: N/A;    Past Medical Hx:  Past Medical History  Diagnosis Date  . GERD (gastroesophageal reflux disease)   . Hyperlipidemia   . Shingles 2005  . Hypertension   . History of cerebrovascular accident   . Bell's palsy 12/84 (approx)  . Cancer 10/28/2009    squamous cell - upper right arm  . Cancer 10/28/09    basal cell -upper right back  . Change in vision   . Stroke 09/2007  . Hiatal hernia 2008  . Bradycardia   . Polyp  of colon   . Cataract   . Macular degeneration   . Vocal cord polyp   . Whooping cough   . Measles   . Mumps     Oncology Hx:    Breast cancer, female   03/17/2011 Initial Diagnosis Breast cancer, female    Endometrial cancer   04/25/2013 Initial Diagnosis Endometrial cancer    Family Hx:  Family History  Problem Relation Age of Onset  . Cancer Sister 65    Lymphoma  . Cancer Paternal Aunt     Breast cancer  .   Cancer Daughter 54    Bilateral Breast Cancer  . Cancer Daughter     Breast Cancer  . Cancer Sister     2005 - Renal Cell Cancer and Breast Cancer  . Heart disease Father     Vitals:  Blood pressure 147/73, pulse 52, temperature 97.6 F (36.4 C), resp. rate 20, height 5' 7" (1.702 m), weight 195 lb 12.8 oz (88.814 kg).  Physical Exam: Well-nourished well-developed female in no acute distress.  Neck: Supple, no lymphadenopathy no thyromegaly.  Lungs: Clear to auscultation bilaterally.  Cardiovascular: Regular rate and rhythm.  Abdomen: Well-healed laparoscopic incisions. Abdomen is soft, nontender, nondistended. There are no palpable masses or hepatosplenomegaly.  Groins: No lymphadenopathy.  Extremities: No edema.  Pelvic: External genitalia atrophic. The vagina is markedly atrophic the cervix is visualized. There are no visible lesions. There is scant Brown discharge. Bimanual examination reveals the uterus to be of normal size, shape, and consistency. There are no adnexal masses. Rectal confirms.  Assessment/Plan: 80-year-old with a grade 1 endometrioid carcinoma. Clinically has stage I disease. 20 minutes face to face time was spent with the patient, her husband and her daughter. We discussed her endometrial cancer. We discussed that the recommendations would involve hysterectomy with removal of the uterus, cervix, and bilateral adnexa. We discussed that the uterus to be sent for frozen section. If frozen section confirms a grade 1 lesion with less than  50% myometrial invasion, she would not need any additional procedures. If at the time of frozen section there was either greater than 50% myometrial invasion or high-grade disease she would require at least pelvic lymph node assessment and potentially periaortic lymph node dissection. I discussed with them that I think the likelihood of that is quite low.  I believe we can perform her surgery in a minimally invasive fashion we discussed robotic surgery. The patient has had a laparoscopic cholecystectomy so she's aware of the recovery from that. Risks of surgery including but not limited to bleeding, infection, injury to surrounding organs, thromboembolic disease and recurrent stroke were discussed with the patient. I have contacted Dr. Metheney for recommendations regarding the management of her Plavix and aspirin in the preoperative period I have also asked her to comment on any other preoperative workup for evaluation I would be recommended for this patient. I discussed with patient and her family that if she does well postoperatively typically she's only in the hospital one night. They understand if we need to proceed with laparotomy that will extend the risk of complications as well as her length of stay.  Their questions were elicited in answer to their satisfaction. She is tentatively scheduled for May 12 hear in [place] with Dr. Wendy Brewster. She was offered sooner date and [place] as she does not want to wait. However, they do not want to go to [place] for surgery and would rather have her surgery in [place].  She has our contact information. She'll call us if they have any questions. We discussed the routine preoperative process our practice and she will be scheduled for preop visit. Michelle [name] A. Michelle Henault, MD 05/22/2013, 9:46 AM   

## 2013-05-27 ENCOUNTER — Telehealth: Payer: Self-pay | Admitting: *Deleted

## 2013-05-27 NOTE — Telephone Encounter (Signed)
Called pt to go over instructions per MD. Pt to stop Plavix on May 6 continue aspirin prior to surgery may 12. Pt to call back to confirm message received.

## 2013-05-28 ENCOUNTER — Encounter (HOSPITAL_COMMUNITY): Payer: Self-pay | Admitting: Pharmacy Technician

## 2013-05-29 NOTE — Patient Instructions (Addendum)
Michelle [name]  05/29/2013                           YOUR PROCEDURE IS SCHEDULED ON: 06/11/13 AT 10:15 AM               PLEASE REPORT TO SHORT STAY CENTER AT : 8:15 AM               CALL THIS NUMBER IF ANY PROBLEMS THE DAY OF SURGERY :               [phone]                                REMEMBER:   Do not eat food or drink liquids AFTER MIDNIGHT   CLEAR LIQUIDS ONLY FOR 24 HRS PREOP               Take these medicines the morning of surgery with               A SIPS OF WATER :   FLONASE IF NEEDED / CRESTOR       Do not wear jewelry, make-up   Do not wear lotions, powders, or perfumes.   Do not shave legs or underarms 12 hrs. before surgery (men may shave face)  Do not bring valuables to the hospital.  Contacts, dentures or bridgework may not be worn into surgery.  Leave suitcase in the car. After surgery it may be brought to your room.  For patients admitted to the hospital more than one night, checkout time is            11:00 AM                                                       ________________________________________________________________________                  CLEAR LIQUID DIET   Foods Allowed                                                                     Foods Excluded  Coffee and tea, regular and decaf                             liquids that you cannot  Plain Jell-O in any flavor                                             see through such as: Fruit ices (not with fruit pulp)                                     milk, soups, orange juice  Iced Popsicles  All solid food Carbonated beverages, regular and diet                                    Cranberry, grape and apple juices Sports drinks like Gatorade Lightly seasoned clear broth or consume(fat free) Sugar, honey syrup  _____________________________________________________________________                                             [facility] -  Preparing for Surgery Before surgery, you can play an important role.  Because skin is not sterile, your skin needs to be as free of germs as possible.  You can reduce the number of germs on your skin by washing with CHG (chlorahexidine gluconate) soap before surgery.  CHG is an antiseptic cleaner which kills germs and bonds with the skin to continue killing germs even after washing. Please DO NOT use if you have an allergy to CHG or antibacterial soaps.  If your skin becomes reddened/irritated stop using the CHG and inform your nurse when you arrive at Short Stay. Do not shave (including legs and underarms) for at least 48 hours prior to the first CHG shower.  You may shave your face. Please follow these instructions carefully:  1.  Shower with CHG Soap the night before surgery and the  morning of Surgery.   2.  If you choose to wash your hair, wash your hair first as usual with your  normal  Shampoo.   3.  After you shampoo, rinse your hair and body thoroughly to remove the  shampoo.                                         4.  Use CHG as you would any other liquid soap.  You can apply chg directly  to the skin and wash . Gently wash with scrungie or clean wascloth    5.  Apply the CHG Soap to your body ONLY FROM THE NECK DOWN.   Do not use on open                           Wound or open sores. Avoid contact with eyes, ears mouth and genitals (private parts).                        Genitals (private parts) with your normal soap.              6.  Wash thoroughly, paying special attention to the area where your surgery  will be performed.   7.  Thoroughly rinse your body with warm water from the neck down.   8.  DO NOT shower/wash with your normal soap after using and rinsing off  the CHG Soap .                9.  Pat yourself dry with a clean towel.             10.  Wear clean pajamas.             11.  Place clean sheets on your bed the night of your first shower  and do not  sleep with  pets.  Day of Surgery : Do not apply any lotions/deodorants the morning of surgery.  Please wear clean clothes to the hospital/surgery center.  FAILURE TO FOLLOW THESE INSTRUCTIONS MAY RESULT IN THE CANCELLATION OF YOUR SURGERY    PATIENT SIGNATURE_________________________________     Incentive Spirometer  An incentive spirometer is a tool that can help keep your lungs clear and active. This tool measures how well you are filling your lungs with each breath. Taking long deep breaths may help reverse or decrease the chance of developing breathing (pulmonary) problems (especially infection) following:  A long period of time when you are unable to move or be active. BEFORE THE PROCEDURE   If the spirometer includes an indicator to show your best effort, your nurse or respiratory therapist will set it to a desired goal.  If possible, sit up straight or lean slightly forward. Try not to slouch.  Hold the incentive spirometer in an upright position. INSTRUCTIONS FOR USE  1. Sit on the edge of your bed if possible, or sit up as far as you can in bed or on a chair. 2. Hold the incentive spirometer in an upright position. 3. Breathe out normally. 4. Place the mouthpiece in your mouth and seal your lips tightly around it. 5. Breathe in slowly and as deeply as possible, raising the piston or the ball toward the top of the column. 6. Hold your breath for 3-5 seconds or for as long as possible. Allow the piston or ball to fall to the bottom of the column. 7. Remove the mouthpiece from your mouth and breathe out normally. 8. Rest for a few seconds and repeat Steps 1 through 7 at least 10 times every 1-2 hours when you are awake. Take your time and take a few normal breaths between deep breaths. 9. The spirometer may include an indicator to show your best effort. Use the indicator as a goal to work toward during each repetition. 10. After each set of 10 deep breaths, practice coughing to be sure  your lungs are clear. If you have an incision (the cut made at the time of surgery), support your incision when coughing by placing a pillow or rolled up towels firmly against it. Once you are able to get out of bed, walk around indoors and cough well. You may stop using the incentive spirometer when instructed by your caregiver.  RISKS AND COMPLICATIONS  Take your time so you do not get dizzy or light-headed.  If you are in pain, you may need to take or ask for pain medication before doing incentive spirometry. It is harder to take a deep breath if you are having pain. AFTER USE  Rest and breathe slowly and easily.  It can be helpful to keep track of a log of your progress. Your caregiver can provide you with a simple table to help with this. If you are using the spirometer at home, follow these instructions: [place] IF:   You are having difficultly using the spirometer.  You have trouble using the spirometer as often as instructed.  Your pain medication is not giving enough relief while using the spirometer.  You develop fever of 100.5 F (38.1 C) or higher. SEEK IMMEDIATE MEDICAL CARE IF:   You cough up bloody sputum that had not been present before.  You develop fever of 102 F (38.9 C) or greater.  You develop worsening pain at or near the incision site.  MAKE SURE YOU:   Understand these instructions.  Will watch your condition.  Will get help right away if you are not doing well or get worse. Document Released: 05/30/2006 Document Revised: 04/11/2011 Document Reviewed: 07/31/2006 Exit[employer] Patient Information 2014 [employer], [place].   ________________________________________________________________________  WHAT IS A BLOOD TRANSFUSION? Blood Transfusion Information  A transfusion is the replacement of blood or some of its parts. Blood is made up of multiple cells which provide different functions.  Red blood cells carry oxygen and are used for blood loss  replacement.  White blood cells fight against infection.  Platelets control bleeding.  Plasma helps clot blood.  Other blood products are available for specialized needs, such as hemophilia or other clotting disorders. BEFORE THE TRANSFUSION  Who gives blood for transfusions?   Healthy volunteers who are fully evaluated to make sure their blood is safe. This is blood bank blood. Transfusion therapy is the safest it has ever been in the practice of medicine. Before blood is taken from a donor, a complete history is taken to make sure that person has no history of diseases nor engages in risky social behavior (examples are intravenous drug use or sexual activity with multiple partners). The donor's travel history is screened to minimize risk of transmitting infections, such as malaria. The donated blood is tested for signs of infectious diseases, such as HIV and hepatitis. The blood is then tested to be sure it is compatible with you in order to minimize the chance of a transfusion reaction. If you or a relative donates blood, this is often done in anticipation of surgery and is not appropriate for emergency situations. It takes many days to process the donated blood. RISKS AND COMPLICATIONS Although transfusion therapy is very safe and saves many lives, the main dangers of transfusion include:   Getting an infectious disease.  Developing a transfusion reaction. This is an allergic reaction to something in the blood you were given. Every precaution is taken to prevent this. The decision to have a blood transfusion has been considered carefully by your caregiver before blood is given. Blood is not given unless the benefits outweigh the risks. AFTER THE TRANSFUSION  Right after receiving a blood transfusion, you will usually feel much better and more energetic. This is especially true if your red blood cells have gotten low (anemic). The transfusion raises the level of the red blood cells which  carry oxygen, and this usually causes an energy increase.  The nurse administering the transfusion will monitor you carefully for complications. HOME CARE INSTRUCTIONS  No special instructions are needed after a transfusion. You may find your energy is better. Speak with your caregiver about any limitations on activity for underlying diseases you may have. SEEK MEDICAL CARE IF:   Your condition is not improving after your transfusion.  You develop redness or irritation at the intravenous (IV) site. SEEK IMMEDIATE MEDICAL CARE IF:  Any of the following symptoms occur over the next 12 hours:  Shaking chills.  You have a temperature by mouth above 102 F (38.9 C), not controlled by medicine.  Chest, back, or muscle pain.  People around you feel you are not acting correctly or are confused.  Shortness of breath or difficulty breathing.  Dizziness and fainting.  You get a rash or develop hives.  You have a decrease in urine output.  Your urine turns a dark color or changes to pink, red, or brown. Any of the following symptoms occur over the next 10 days:  You have a temperature by mouth above 102 F (38.9 C), not controlled by medicine.  Shortness of breath.  Weakness after normal activity.  The white part of the eye turns yellow (jaundice).  You have a decrease in the amount of urine or are urinating less often.  Your urine turns a dark color or changes to pink, red, or brown. Document Released: 01/15/2000 Document Revised: 04/11/2011 Document Reviewed: 09/03/2007 [facility] Patient Information 2014 [place], [place].  _______________________________________________________________________

## 2013-05-30 ENCOUNTER — Encounter (HOSPITAL_COMMUNITY)
Admission: RE | Admit: 2013-05-30 | Discharge: 2013-05-30 | Disposition: A | Discharge status: Home / Self Care | Payer: Medicare Other | Source: Ambulatory Visit | Attending: Obstetrics & Gynecology | Admitting: Obstetrics & Gynecology

## 2013-05-30 ENCOUNTER — Encounter (HOSPITAL_COMMUNITY): Payer: Self-pay

## 2013-05-30 ENCOUNTER — Ambulatory Visit (HOSPITAL_COMMUNITY)
Admission: RE | Admit: 2013-05-30 | Discharge: 2013-05-30 | Disposition: A | Discharge status: Home / Self Care | Payer: Medicare Other | Source: Ambulatory Visit | Attending: Gynecologic Oncology | Admitting: Gynecologic Oncology

## 2013-05-30 DIAGNOSIS — Z01818 Encounter for other preprocedural examination: Secondary | ICD-10-CM | POA: Insufficient documentation

## 2013-05-30 DIAGNOSIS — Z01812 Encounter for preprocedural laboratory examination: Secondary | ICD-10-CM | POA: Insufficient documentation

## 2013-05-30 HISTORY — DX: Malignant neoplasm of endometrium: C54.1

## 2013-05-30 HISTORY — DX: Enthesopathy, unspecified: M77.9

## 2013-05-30 HISTORY — DX: Malignant neoplasm of unspecified site of unspecified female breast: C50.919

## 2013-05-30 HISTORY — DX: Personal history of malignant neoplasm of breast: Z85.3

## 2013-05-30 LAB — CBC WITH DIFFERENTIAL/PLATELET
BASOS PCT: 1 % (ref 0–1)
Basophils Absolute: 0.1 10*3/uL (ref 0.0–0.1)
Eosinophils Absolute: 0.2 10*3/uL (ref 0.0–0.7)
Eosinophils Relative: 3 % (ref 0–5)
HEMATOCRIT: 38.9 % (ref 36.0–46.0)
HEMOGLOBIN: 13.2 g/dL (ref 12.0–15.0)
Lymphocytes Relative: 27 % (ref 12–46)
Lymphs Abs: 2.2 10*3/uL (ref 0.7–4.0)
MCH: 33.2 pg (ref 26.0–34.0)
MCHC: 33.9 g/dL (ref 30.0–36.0)
MCV: 97.7 fL (ref 78.0–100.0)
MONO ABS: 0.6 10*3/uL (ref 0.1–1.0)
Monocytes Relative: 8 % (ref 3–12)
NEUTROS ABS: 4.8 10*3/uL (ref 1.7–7.7)
Neutrophils Relative %: 61 % (ref 43–77)
Platelets: 230 10*3/uL (ref 150–400)
RBC: 3.98 MIL/uL (ref 3.87–5.11)
RDW: 12.3 % (ref 11.5–15.5)
WBC: 7.9 10*3/uL (ref 4.0–10.5)

## 2013-05-30 LAB — URINALYSIS, ROUTINE W REFLEX MICROSCOPIC
Bilirubin Urine: NEGATIVE
GLUCOSE, UA: NEGATIVE mg/dL
HGB URINE DIPSTICK: NEGATIVE
Ketones, ur: NEGATIVE mg/dL
LEUKOCYTES UA: NEGATIVE
Nitrite: NEGATIVE
PROTEIN: NEGATIVE mg/dL
Specific Gravity, Urine: 1.005 (ref 1.005–1.030)
Urobilinogen, UA: 0.2 mg/dL (ref 0.0–1.0)
pH: 7 (ref 5.0–8.0)

## 2013-05-30 LAB — COMPREHENSIVE METABOLIC PANEL
ALBUMIN: 3.9 g/dL (ref 3.5–5.2)
ALT: 13 U/L (ref 0–35)
AST: 22 U/L (ref 0–37)
Alkaline Phosphatase: 101 U/L (ref 39–117)
BILIRUBIN TOTAL: 0.3 mg/dL (ref 0.3–1.2)
BUN: 10 mg/dL (ref 6–23)
CALCIUM: 9.5 mg/dL (ref 8.4–10.5)
CHLORIDE: 103 meq/L (ref 96–112)
CO2: 27 mEq/L (ref 19–32)
CREATININE: 0.87 mg/dL (ref 0.50–1.10)
GFR calc Af Amer: 72 mL/min — ABNORMAL LOW (ref >90)
GFR calc non Af Amer: 62 mL/min — ABNORMAL LOW (ref >90)
Glucose, Bld: 93 mg/dL (ref 70–99)
Potassium: 4.1 mEq/L (ref 3.7–5.3)
Sodium: 140 mEq/L (ref 137–147)
Total Protein: 7.1 g/dL (ref 6.0–8.3)

## 2013-05-31 DIAGNOSIS — C541 Malignant neoplasm of endometrium: Secondary | ICD-10-CM

## 2013-05-31 HISTORY — DX: Malignant neoplasm of endometrium: C54.1

## 2013-06-05 ENCOUNTER — Telehealth: Payer: Self-pay | Admitting: *Deleted

## 2013-06-05 NOTE — Telephone Encounter (Signed)
Clled pt with reminder to stop plavix, continue aspirin for upcoming surgery on may 12. Pt's husband verbalized pt has stopped plavix and will continue aspirin. No further concerns.

## 2013-06-05 NOTE — Telephone Encounter (Signed)
error 

## 2013-06-11 ENCOUNTER — Ambulatory Visit (HOSPITAL_COMMUNITY): Payer: Medicare Other | Admitting: Anesthesiology

## 2013-06-11 ENCOUNTER — Encounter (HOSPITAL_COMMUNITY): Payer: Self-pay | Admitting: *Deleted

## 2013-06-11 ENCOUNTER — Inpatient Hospital Stay (HOSPITAL_COMMUNITY)
Admission: RE | Admit: 2013-06-11 | Discharge: 2013-06-12 | DRG: 741 | Disposition: A | Discharge status: Home / Self Care | Payer: Medicare Other | Source: Ambulatory Visit | Attending: Obstetrics & Gynecology | Admitting: Obstetrics & Gynecology

## 2013-06-11 ENCOUNTER — Encounter (HOSPITAL_COMMUNITY): Payer: Medicare Other | Admitting: Anesthesiology

## 2013-06-11 ENCOUNTER — Encounter (HOSPITAL_COMMUNITY)
Admission: RE | Disposition: A | Discharge status: Home / Self Care | Payer: Self-pay | Source: Ambulatory Visit | Attending: Obstetrics & Gynecology

## 2013-06-11 DIAGNOSIS — C549 Malignant neoplasm of corpus uteri, unspecified: Principal | ICD-10-CM | POA: Diagnosis present

## 2013-06-11 DIAGNOSIS — Z8542 Personal history of malignant neoplasm of other parts of uterus: Secondary | ICD-10-CM | POA: Diagnosis present

## 2013-06-11 DIAGNOSIS — Z888 Allergy status to other drugs, medicaments and biological substances status: Secondary | ICD-10-CM

## 2013-06-11 DIAGNOSIS — Z9089 Acquired absence of other organs: Secondary | ICD-10-CM

## 2013-06-11 DIAGNOSIS — Z8673 Personal history of transient ischemic attack (TIA), and cerebral infarction without residual deficits: Secondary | ICD-10-CM

## 2013-06-11 DIAGNOSIS — Z881 Allergy status to other antibiotic agents status: Secondary | ICD-10-CM

## 2013-06-11 DIAGNOSIS — Z7982 Long term (current) use of aspirin: Secondary | ICD-10-CM

## 2013-06-11 DIAGNOSIS — Z8051 Family history of malignant neoplasm of kidney: Secondary | ICD-10-CM

## 2013-06-11 DIAGNOSIS — Z853 Personal history of malignant neoplasm of breast: Secondary | ICD-10-CM

## 2013-06-11 DIAGNOSIS — Z85828 Personal history of other malignant neoplasm of skin: Secondary | ICD-10-CM

## 2013-06-11 DIAGNOSIS — Z860100 Personal history of colon polyps, unspecified: Secondary | ICD-10-CM

## 2013-06-11 DIAGNOSIS — I10 Essential (primary) hypertension: Secondary | ICD-10-CM | POA: Diagnosis present

## 2013-06-11 DIAGNOSIS — H3530 Unspecified macular degeneration: Secondary | ICD-10-CM | POA: Diagnosis present

## 2013-06-11 DIAGNOSIS — Z803 Family history of malignant neoplasm of breast: Secondary | ICD-10-CM

## 2013-06-11 DIAGNOSIS — E785 Hyperlipidemia, unspecified: Secondary | ICD-10-CM | POA: Diagnosis present

## 2013-06-11 DIAGNOSIS — Z8249 Family history of ischemic heart disease and other diseases of the circulatory system: Secondary | ICD-10-CM

## 2013-06-11 DIAGNOSIS — C541 Malignant neoplasm of endometrium: Secondary | ICD-10-CM

## 2013-06-11 DIAGNOSIS — K219 Gastro-esophageal reflux disease without esophagitis: Secondary | ICD-10-CM | POA: Diagnosis present

## 2013-06-11 DIAGNOSIS — Z807 Family history of other malignant neoplasms of lymphoid, hematopoietic and related tissues: Secondary | ICD-10-CM

## 2013-06-11 DIAGNOSIS — Z79899 Other long term (current) drug therapy: Secondary | ICD-10-CM

## 2013-06-11 HISTORY — PX: ROBOTIC ASSISTED TOTAL HYSTERECTOMY WITH BILATERAL SALPINGO OOPHERECTOMY: SHX6086

## 2013-06-11 LAB — TYPE AND SCREEN
ABO/RH(D): B POS
ANTIBODY SCREEN: NEGATIVE

## 2013-06-11 LAB — ABO/RH: ABO/RH(D): B POS

## 2013-06-11 SURGERY — ROBOTIC ASSISTED TOTAL HYSTERECTOMY WITH BILATERAL SALPINGO OOPHORECTOMY
Anesthesia: General | Laterality: Bilateral

## 2013-06-11 MED ORDER — LACTATED RINGERS IV SOLN
INTRAVENOUS | Status: DC
  Administered 2013-06-11: 15:00:00 via INTRAVENOUS
  Administered 2013-06-11: 1000 mL via INTRAVENOUS

## 2013-06-11 MED ORDER — CEFAZOLIN SODIUM-DEXTROSE 2-3 GM-% IV SOLR
INTRAVENOUS | Status: AC
  Filled 2013-06-11: qty 50

## 2013-06-11 MED ORDER — ONDANSETRON HCL 4 MG/2ML IJ SOLN
INTRAMUSCULAR | Status: DC | PRN
  Administered 2013-06-11: 4 mg via INTRAVENOUS

## 2013-06-11 MED ORDER — DEXAMETHASONE SODIUM PHOSPHATE 10 MG/ML IJ SOLN
INTRAMUSCULAR | Status: DC | PRN
  Administered 2013-06-11: 10 mg via INTRAVENOUS

## 2013-06-11 MED ORDER — FLUTICASONE PROPIONATE 50 MCG/ACT NA SUSP
1.0000 | Freq: Every day | NASAL | Status: DC
  Filled 2013-06-11: qty 16

## 2013-06-11 MED ORDER — SODIUM CHLORIDE 0.9 % IV BOLUS (SEPSIS)
500.0000 mL | Freq: Once | INTRAVENOUS | Status: AC
  Administered 2013-06-11: 500 mL via INTRAVENOUS

## 2013-06-11 MED ORDER — LACTATED RINGERS IV SOLN: INTRAVENOUS | Status: DC

## 2013-06-11 MED ORDER — ASPIRIN 325 MG PO TABS
325.0000 mg | ORAL_TABLET | Freq: Every day | ORAL | Status: DC
  Administered 2013-06-11: 325 mg via ORAL
  Filled 2013-06-11 (×2): qty 1

## 2013-06-11 MED ORDER — KETOROLAC TROMETHAMINE 30 MG/ML IJ SOLN
30.0000 mg | Freq: Four times a day (QID) | INTRAMUSCULAR | Status: DC
  Administered 2013-06-11 – 2013-06-12 (×3): 30 mg via INTRAVENOUS
  Filled 2013-06-11 (×6): qty 1

## 2013-06-11 MED ORDER — ATORVASTATIN CALCIUM 20 MG PO TABS
20.0000 mg | ORAL_TABLET | Freq: Every day | ORAL | Status: DC
  Filled 2013-06-11 (×2): qty 1

## 2013-06-11 MED ORDER — NEOSTIGMINE METHYLSULFATE 10 MG/10ML IV SOLN
INTRAVENOUS | Status: DC | PRN
  Administered 2013-06-11: 5 mg via INTRAVENOUS

## 2013-06-11 MED ORDER — NEOSTIGMINE METHYLSULFATE 10 MG/10ML IV SOLN
INTRAVENOUS | Status: AC
  Filled 2013-06-11: qty 1

## 2013-06-11 MED ORDER — OXYCODONE-ACETAMINOPHEN 5-325 MG PO TABS
1.0000 | ORAL_TABLET | ORAL | Status: DC | PRN
  Administered 2013-06-12 (×2): 1 via ORAL
  Filled 2013-06-11 (×3): qty 1

## 2013-06-11 MED ORDER — SODIUM CHLORIDE 0.9 % IV SOLN: INTRAVENOUS | Status: DC

## 2013-06-11 MED ORDER — GLYCOPYRROLATE 0.2 MG/ML IJ SOLN
INTRAMUSCULAR | Status: DC | PRN
  Administered 2013-06-11: 0.2 mg via INTRAVENOUS
  Administered 2013-06-11: 0.6 mg via INTRAVENOUS
  Administered 2013-06-11: 0.2 mg via INTRAVENOUS

## 2013-06-11 MED ORDER — PANTOPRAZOLE SODIUM 20 MG PO TBEC
20.0000 mg | DELAYED_RELEASE_TABLET | Freq: Every day | ORAL | Status: DC
  Administered 2013-06-11 – 2013-06-12 (×2): 20 mg via ORAL
  Filled 2013-06-11 (×2): qty 1

## 2013-06-11 MED ORDER — GLYCOPYRROLATE 0.2 MG/ML IJ SOLN
INTRAMUSCULAR | Status: AC
  Filled 2013-06-11: qty 3

## 2013-06-11 MED ORDER — SUCCINYLCHOLINE CHLORIDE 20 MG/ML IJ SOLN
INTRAMUSCULAR | Status: DC | PRN
  Administered 2013-06-11: 100 mg via INTRAVENOUS

## 2013-06-11 MED ORDER — ONDANSETRON HCL 4 MG/2ML IJ SOLN
INTRAMUSCULAR | Status: AC
  Filled 2013-06-11: qty 2

## 2013-06-11 MED ORDER — FENTANYL CITRATE 0.05 MG/ML IJ SOLN
INTRAMUSCULAR | Status: AC
  Filled 2013-06-11: qty 5

## 2013-06-11 MED ORDER — CEFAZOLIN SODIUM-DEXTROSE 2-3 GM-% IV SOLR
2.0000 g | INTRAVENOUS | Status: AC
  Administered 2013-06-11: 2 g via INTRAVENOUS

## 2013-06-11 MED ORDER — ROCURONIUM BROMIDE 100 MG/10ML IV SOLN
INTRAVENOUS | Status: DC | PRN
  Administered 2013-06-11: 10 mg via INTRAVENOUS
  Administered 2013-06-11: 40 mg via INTRAVENOUS

## 2013-06-11 MED ORDER — HYDROMORPHONE HCL PF 1 MG/ML IJ SOLN: 0.5000 mg | INTRAMUSCULAR | Status: DC | PRN

## 2013-06-11 MED ORDER — LIDOCAINE-EPINEPHRINE (PF) 1 %-1:200000 IJ SOLN
INTRAMUSCULAR | Status: DC | PRN
  Administered 2013-06-11: 10 mL

## 2013-06-11 MED ORDER — KETOROLAC TROMETHAMINE 30 MG/ML IJ SOLN
30.0000 mg | Freq: Four times a day (QID) | INTRAMUSCULAR | Status: DC
  Filled 2013-06-11 (×6): qty 1

## 2013-06-11 MED ORDER — GLYCOPYRROLATE 0.2 MG/ML IJ SOLN
INTRAMUSCULAR | Status: AC
  Filled 2013-06-11: qty 1

## 2013-06-11 MED ORDER — PROPOFOL 10 MG/ML IV BOLUS
INTRAVENOUS | Status: DC | PRN
  Administered 2013-06-11: 150 mg via INTRAVENOUS
  Administered 2013-06-11: 50 mg via INTRAVENOUS

## 2013-06-11 MED ORDER — HYDROMORPHONE HCL PF 2 MG/ML IJ SOLN
INTRAMUSCULAR | Status: AC
  Filled 2013-06-11: qty 1

## 2013-06-11 MED ORDER — DEXAMETHASONE SODIUM PHOSPHATE 10 MG/ML IJ SOLN
INTRAMUSCULAR | Status: AC
  Filled 2013-06-11: qty 1

## 2013-06-11 MED ORDER — ONDANSETRON HCL 4 MG/2ML IJ SOLN: 4.0000 mg | Freq: Four times a day (QID) | INTRAMUSCULAR | Status: DC | PRN

## 2013-06-11 MED ORDER — LIDOCAINE HCL (CARDIAC) 20 MG/ML IV SOLN
INTRAVENOUS | Status: DC | PRN
  Administered 2013-06-11: 100 mg via INTRAVENOUS

## 2013-06-11 MED ORDER — FENTANYL CITRATE 0.05 MG/ML IJ SOLN
INTRAMUSCULAR | Status: DC | PRN
  Administered 2013-06-11 (×5): 50 ug via INTRAVENOUS

## 2013-06-11 MED ORDER — LIDOCAINE HCL (CARDIAC) 20 MG/ML IV SOLN
INTRAVENOUS | Status: AC
Start: 2013-06-11 — End: 2013-06-11
  Filled 2013-06-11: qty 5

## 2013-06-11 MED ORDER — KCL IN DEXTROSE-NACL 20-5-0.45 MEQ/L-%-% IV SOLN
INTRAVENOUS | Status: DC
  Administered 2013-06-11: 19:00:00 via INTRAVENOUS
  Filled 2013-06-11 (×5): qty 1000

## 2013-06-11 MED ORDER — HYDROMORPHONE HCL PF 1 MG/ML IJ SOLN: 0.2500 mg | INTRAMUSCULAR | Status: DC | PRN

## 2013-06-11 MED ORDER — HYDROMORPHONE HCL PF 1 MG/ML IJ SOLN
INTRAMUSCULAR | Status: DC | PRN
  Administered 2013-06-11 (×2): 0.5 mg via INTRAVENOUS
  Administered 2013-06-11: 1 mg via INTRAVENOUS

## 2013-06-11 MED ORDER — KETOROLAC TROMETHAMINE 30 MG/ML IJ SOLN
INTRAMUSCULAR | Status: AC
  Filled 2013-06-11: qty 1

## 2013-06-11 MED ORDER — ROCURONIUM BROMIDE 100 MG/10ML IV SOLN
INTRAVENOUS | Status: AC
  Filled 2013-06-11: qty 1

## 2013-06-11 MED ORDER — PROPOFOL 10 MG/ML IV BOLUS
INTRAVENOUS | Status: AC
  Filled 2013-06-11: qty 20

## 2013-06-11 MED ORDER — LOSARTAN POTASSIUM 25 MG PO TABS
25.0000 mg | ORAL_TABLET | Freq: Every morning | ORAL | Status: DC
  Administered 2013-06-12: 25 mg via ORAL
  Filled 2013-06-11: qty 1

## 2013-06-11 MED ORDER — ONDANSETRON HCL 4 MG PO TABS: 4.0000 mg | ORAL_TABLET | Freq: Four times a day (QID) | ORAL | Status: DC | PRN

## 2013-06-11 SURGICAL SUPPLY — 61 items
APL SKNCLS STERI-STRIP NONHPOA (GAUZE/BANDAGES/DRESSINGS) ×1
BAG SPEC RTRVL LRG 6X4 10 (ENDOMECHANICALS) ×2
BENZOIN TINCTURE PRP APPL 2/3 (GAUZE/BANDAGES/DRESSINGS) ×3 IMPLANT
CABLE HIGH FREQUENCY MONO STRZ (ELECTRODE) ×3 IMPLANT
CHLORAPREP W/TINT 26ML (MISCELLANEOUS) ×5 IMPLANT
CLOSURE WOUND 1/2 X4 (GAUZE/BANDAGES/DRESSINGS) ×1
CORDS BIPOLAR (ELECTRODE) ×3 IMPLANT
COVER MAYO STAND STRL (DRAPES) ×3 IMPLANT
COVER SURGICAL LIGHT HANDLE (MISCELLANEOUS) ×3 IMPLANT
COVER TIP SHEARS 8 DVNC (MISCELLANEOUS) ×1 IMPLANT
COVER TIP SHEARS 8MM DA VINCI (MISCELLANEOUS) ×2
DRAPE LG THREE QUARTER DISP (DRAPES) ×6 IMPLANT
DRAPE SURG IRRIG POUCH 19X23 (DRAPES) ×3 IMPLANT
DRAPE TABLE BACK 44X90 PK DISP (DRAPES) ×6 IMPLANT
DRAPE UTILITY XL STRL (DRAPES) ×1 IMPLANT
DRAPE WARM FLUID 44X44 (DRAPE) ×3 IMPLANT
DRSG TEGADERM 2-3/8X2-3/4 SM (GAUZE/BANDAGES/DRESSINGS) ×13 IMPLANT
DRSG TEGADERM 4X4.75 (GAUZE/BANDAGES/DRESSINGS) ×1 IMPLANT
DRSG TEGADERM 6X8 (GAUZE/BANDAGES/DRESSINGS) ×6 IMPLANT
ELECT REM PT RETURN 9FT ADLT (ELECTROSURGICAL) ×3
ELECTRODE REM PT RTRN 9FT ADLT (ELECTROSURGICAL) ×1 IMPLANT
GAUZE SPONGE 2X2 8PLY STRL LF (GAUZE/BANDAGES/DRESSINGS) ×2 IMPLANT
GLOVE BIO SURGEON STRL SZ 6.5 (GLOVE) ×8 IMPLANT
GLOVE BIO SURGEON STRL SZ7.5 (GLOVE) ×6 IMPLANT
GLOVE BIO SURGEONS STRL SZ 6.5 (GLOVE) ×4
GLOVE INDICATOR 8.0 STRL GRN (GLOVE) ×6 IMPLANT
GOWN STRL NON-REIN LRG LVL3 (GOWN DISPOSABLE) ×3 IMPLANT
GOWN STRL REUS W/TWL XL LVL3 (GOWN DISPOSABLE) ×6 IMPLANT
HOLDER FOLEY CATH W/STRAP (MISCELLANEOUS) ×3 IMPLANT
KIT ACCESSORY DA VINCI DISP (KITS) ×2
KIT ACCESSORY DVNC DISP (KITS) ×1 IMPLANT
KIT BASIN OR (CUSTOM PROCEDURE TRAY) ×3 IMPLANT
MANIPULATOR UTERINE 4.5 ZUMI (MISCELLANEOUS) ×3 IMPLANT
OCCLUDER COLPOPNEUMO (BALLOONS) ×5 IMPLANT
POUCH SPECIMEN RETRIEVAL 10MM (ENDOMECHANICALS) ×6 IMPLANT
SET TUBE IRRIG SUCTION NO TIP (IRRIGATION / IRRIGATOR) ×3 IMPLANT
SHEET LAVH (DRAPES) ×3 IMPLANT
SLEEVE SURGEON STRL (DRAPES) ×2 IMPLANT
SOLUTION ELECTROLUBE (MISCELLANEOUS) ×3 IMPLANT
SPONGE GAUZE 2X2 STER 10/PKG (GAUZE/BANDAGES/DRESSINGS) ×4
SPONGE LAP 18X18 X RAY DECT (DISPOSABLE) IMPLANT
STRIP CLOSURE SKIN 1/2X4 (GAUZE/BANDAGES/DRESSINGS) ×2 IMPLANT
SUT VIC AB 0 CT1 27 (SUTURE) ×3
SUT VIC AB 0 CT1 27XBRD ANTBC (SUTURE) ×1 IMPLANT
SUT VIC AB 2-0 CT1 27 (SUTURE) ×9
SUT VIC AB 2-0 CT1 TAPERPNT 27 (SUTURE) IMPLANT
SUT VIC AB 4-0 PS2 27 (SUTURE) ×6 IMPLANT
SUT VICRYL 0 UR6 27IN ABS (SUTURE) ×3 IMPLANT
SUT VLOC 180 2-0 9IN GS21 (SUTURE) ×4 IMPLANT
SYR 50ML LL SCALE MARK (SYRINGE) ×3 IMPLANT
SYR BULB IRRIGATION 50ML (SYRINGE) IMPLANT
TOWEL OR 17X26 10 PK STRL BLUE (TOWEL DISPOSABLE) ×6 IMPLANT
TOWEL OR NON WOVEN STRL DISP B (DISPOSABLE) ×3 IMPLANT
TRAP SPECIMEN MUCOUS 40CC (MISCELLANEOUS) IMPLANT
TRAY FOLEY CATH 14FRSI W/METER (CATHETERS) ×3 IMPLANT
TRAY LAP CHOLE (CUSTOM PROCEDURE TRAY) ×3 IMPLANT
TROCAR 12M 150ML BLUNT (TROCAR) ×3 IMPLANT
TROCAR BLADELESS OPT 5 75 (ENDOMECHANICALS) ×3 IMPLANT
TROCAR XCEL 12X100 BLDLESS (ENDOMECHANICALS) ×3 IMPLANT
TUBING INSUFFLATION 10FT LAP (TUBING) ×3 IMPLANT
WATER STERILE IRR 1500ML POUR (IV SOLUTION) ×6 IMPLANT

## 2013-06-11 NOTE — Transfer of Care (Signed)
Immediate Anesthesia Transfer of Care Note  Patient: Michelle [name]  Procedure(s) Performed: Procedure(s) (LRB): ROBOTIC ASSISTED TOTAL HYSTERECTOMY WITH BILATERAL SALPINGO OOPHORECTOMY WITH POSSIBLE STAGING, EPISIOTOMY (Bilateral)  Patient Location: PACU  Anesthesia Type: General  Level of Consciousness: sedated, patient cooperative and responds to stimulation  Airway & Oxygen Therapy: Patient Spontanous Breathing and Patient connected to face mask oxgen  Post-op Assessment: Report given to PACU RN and Post -op Vital signs reviewed and stable  Post vital signs: Reviewed and stable  Complications: No apparent anesthesia complications

## 2013-06-11 NOTE — Progress Notes (Signed)
Dr Jodi Mourning made aware patient urinary output equals 100 cc since 1700 in her foley catheter.  Orders received to give normal saline bolus

## 2013-06-11 NOTE — OR Nursing (Signed)
Port were not entered into chart. lda page completed to reflect the correct port sites. 06/11/2013 @ 1606

## 2013-06-11 NOTE — Op Note (Signed)
Preoperative Diagnosis: Grade 1 endometrial cancer  Postoperative Diagnosis: Stage IA grade 1 endometrial cancer  Procedure(s) Performed: Robotic total laparoscopic hysterectomy, Bilateral salpingo oophorectomy,  Bilateral pelvic lymph node dissection, episiotomy, repair of bilateral sucal lacerations.  Anesthesia: GET   Surgeon: Francetta Found.  Skeet Latch, M.D. PhD  Assistant Surgeon:Lisa Delsa Sale MD.   Specimens: Uterus cervix ovaries tubes, bilateral pelvic lymph nodes  Estimated Blood Loss: 264mL.   Complications:bilateral lateral sulcal lacerations.  Indication for Procedure: This is a  80 y.o. who underwent prior endometrial assessment   Demonstrating  grade 1 endometrial cancer.  Operative Findings:  7 cm uterus. Normal  adnexa. No masses.   Frozen pathology was consistent with grade 1 endometrial cancer   Procedure: Patient was taken to the operating room and placed under general endotracheal anesthesia without any difficulty. She is placed in the dorsal lithotomy position and secured to the operative table over the chest with tape.   The patient was prepped and draped and the uterine manipulator placed within the endometrial cavity.A second degree episiotomy was performed to facilitate entry of the medium size Koh ring into the vagina.The  Koh ring was circumferentially around the cervix. The balloon was placed within the vagina. An OG tube was present and functional. At an area on the left in line with the nipple approximately 2 cm below the ribs the area an incision was made  and a 5 mm Optiview inserted under direct visualization. The abdomen was insufflated to 15 mm of mercury and the pressure never deviated above that throughout the remainder of the procedure. Maximum Trendelenburg positioning was obtained. At approximately 22 cm proximal to the symphysis pubis an incision was made just superior to the umbilicus as well as the location 10 cm lateral to this incision.  A 10 mm  trocar was inserted in the superior umbilicus incision. 8 millimeter robotic ports were placed in the other 2 incisions. The left upper quadrant port site was replaced with a 10 mm port. This was all completed under direct visualization. Oozing was noted from all the incision sites.  The small and large bowel were reflected as much as possible into the upper abdomen. The robot was docked and instruments placed.  The right round ligament was transected and the ureter was identified. The right infundibulopelvic ligament was cauterized and transected The retroperitoneal space was entered on the right and the peritoneum incised to the level of the vesicouterine ligament anteriorly. The bladder flap was created using Bovie cautery. The peritoneal dissection was continued inferiorly and across the inferior most aspect of the cervix. In this manner the urethra was deflected inferiorly. The bladder flap was further developed. The uterine vessels on the right were skeletonized ligated and transected.  The left ureter was identified. The left gonadal vessels were cauterized and transected. The broad ligament was skeletonized posteriorly to the level of the cervix and the peritoneum dissected free from the cervix and in this fashion the ureter was deflected inferiorly. The anterior peritoneum was further dissected and the bladder flap appropriately developed. The uterine vessels were skeletonized cauterized and transected. The balloon and the vagina was then maximally insufflated. A olpotomy incision was made circumferentially and the uterus cervix ovaries and tubes were delivered from the vagina. The Koh ring was removed and the balloon was replaced.  Right pelvic lymph node dissection was then initiated. The superior vesicle artery was identified and the vesicouterine space developed. The obturator nerve was identified. Nodal tissue was removed within the  boundaries of the right genitofemoral nerve the right circumflex  vein, the ureter and the superior vesicle artery and obturater nerve.  The nodal tissue was placed in an Endo Catch bag. The left pelvic lymph node dissection was then initiated. The superior vesicle artery was identified and the vesicouterine space developed. The obturator nerve was identified. Nodal tissue was removed within the boundaries of the left  genitofemoral nerve the left circumflex vein, the ureter and the superior vesicle artery. The nodal tissue was placed in an Endo Catch bag.   Frozen  section evaluation returned midway through the left pelvic LN dissection and was significant for less than 50% myometrial invasion.   The specimens were removed through the vagina. The vaginal balloon was reinserted.  The pelvis was copiously irrigated and drained and hemostasis was assured. The vaginal cuff was closed with a V-Lloc suture The needle was removed under direct visualization. The operative site is once again visualized and hemostasis was assured. The instruments were removed from the abdomen and pelvis and the port sites irrigated. The umbilical fascia was closed with an interrupted 0 Vicryl  suture. The subcutaneous tissue of the umbilical left upper quadrant and right lower quadrant subcutaneous tissues were approximated with a single suture. Skin incisions were closed with a subcuticular suture.  Pressure dressings were placed over all laparoscopic port sites/  The vaginal vault was cleared with a moist sponge stick and bleeding was noted as were the sulcal tears.  The sulcal tears were repaired with locking suture with 2.o vicryl.  The episiotomy was repaired in two layers with 2.0 vicryl suture.  Hemostasis was assured.  Sponge, lap and needle counts were correct x 3.    The patient had sequential compression devices and preoperative Lovenox for VTE prophylaxis.         Disposition: PACU - hemodynamically stable.         Condition:stable Foley draining clear urine.

## 2013-06-11 NOTE — H&P (View-Only) (Signed)
Consult Note: Gyn-Onc  Michelle [name] 80 y.o. female  CC:  Chief Complaint  Patient presents with  . Endometrial Adeno    New patient    HPI: Patient is seen today in consultation at the request of Dr. Silas Sacramento  Patient is a 80 year old gravida 2 para 2 has been menopausal for 25+ years. Late in November 2014 she began experiencing a dark vaginal discharge. In January of 2015 she began having some bleeding and spotting. Attempt was made at an endometrial biopsy in the office. However, secondary to her anatomy it could not be performed. She subsequently underwent a D&C on 04/25/2013. Findings are consistent with a grade 1 endometrioid adenocarcinoma. She did have a transvaginal ultrasound on March 5. It revealed the uterus to be 3 x 3.8 x 4.7 cm with no fibroids. There is a thickened endometrial stripe of 1 cm. The right adnexa could not be seen. The left adnexa measured 1.2 x 1.7 x 2.4 cm and was unremarkable.   She's overall doing fairly well. She states that she's had 2-3 episodes of spotting since her D&C. She denies any pain. She's had no change in her bowel or bladder habits. She did have a stroke in August of 2009. She does have some memory loss issues. She's not sure to [place] because of the stroke her age. She otherwise has no neurologic motor or sensory deficits. She is followed by Dr.Metheney. She is up-to-date on her mammograms. Should a colonoscopy in 2014. Her family history is remarkable that she has a personal history of breast cancer. Her sister also had breast cancer. She has 2 daughters currently aged 12 and 3. One daughter had breast cancer at age of 25. The other daughter had her first breast cancer at age of 36 and a recurrence of 17. They have undergone BRCA testing and they are BRCA negative. One daughter underwent Lynch testing and similarly was negative for that as well.  Review of Systems:  Constitutional: No unintentional weight loss or weight gain. Denies fever. Skin:  No rash Cardiovascular: No chest pain, shortness of breath, or edema. Does experience some shortness going up a steep flight of stairs but recovers well. She is limited in terms of pain in her knees. She knows that she needs to do more exercise. She does her ADLs without difficulty. Pulmonary: No cough or wheeze.  Gastro Intestinal:  No nausea, vomiting, constipation, or diarrhea reported. No bright red blood per rectum or change in bowel movement.  Genitourinary: No frequency, urgency, or dysuria.  Denies vaginal bleeding and discharge.  Musculoskeletal: No myalgia, arthralgia, joint swelling or pain.  Neurologic: No weakness, numbness, or change in gait.  Psychology: Memory decline   Current Meds:  Outpatient Encounter Prescriptions as of 05/22/2013  Medication Sig  . acetaminophen (TYLENOL) 500 MG tablet Take 2 tablets (1,000 mg total) by mouth every 6 (six) hours as needed for moderate pain.  Marland Kitchen aspirin 325 MG tablet Take 325 mg by mouth at bedtime.    . clopidogrel (PLAVIX) 75 MG tablet TAKE 1 TABLET (75 MG TOTAL) BY MOUTH DAILY.  . fluticasone (FLONASE) 50 MCG/ACT nasal spray SPRITZ 2 SPRAYS INTO EACH NOSTRIL DAILY  . lansoprazole (PREVACID) 30 MG capsule Take 30 mg by mouth daily with supper.   . losartan (COZAAR) 25 MG tablet TAKE 1 TABLET (25 MG TOTAL) BY MOUTH DAILY.  . rosuvastatin (CRESTOR) 10 MG tablet Take 10 mg by mouth every other day.    Allergy:  Allergies  Allergen Reactions  . Benadryl [Diphenhydramine Hcl]     Hyper  . Doxycycline Other (See Comments)    acid reflux  . Simvastatin Other (See Comments)    REACTION: memory loss  . Lisinopril Other (See Comments)    Dry cough    Social Hx:   History   Social History  . Marital Status: Married    Spouse Name: N/A    Number of Children: N/A  . Years of Education: N/A   Occupational History  . retired    Social History Main Topics  . Smoking status: Former Smoker -- 1.00 packs/day for 30 years    Quit  date: 02/01/1996  . Smokeless tobacco: Never Used  . Alcohol Use: Yes     Comment: rare  . Drug Use: No  . Sexual Activity: Yes    Partners: Male     Comment: Menarche age 30, menopause age 73, HRT x 1year   Other Topics Concern  . Not on file   Social History Narrative  . No narrative on file    Past Surgical Hx:  Past Surgical History  Procedure Laterality Date  . Cholecystectomy  2004  . Biospy  10/28/09    Shave Biospy skin Left hand(Acitinic Keratoses), Right Upper Arm (superficial Basal Cell), Right Upper Back(Superficial Basal Cell), Left Neck ( Solar Lentigo & Seborrheic Keratoses)  . Vocal cord poylps  1969    excision  . Tonsillectomy    . Colonoscopy    . Breast mammosite  03/14/2011    Procedure: MAMMOSITE BREAST;  Surgeon: Rolm Bookbinder, MD;  Location: [facility];  Service: General;  Laterality: Right;  . Breast biopsy  02/11/11    Right Breast Needle Core Biospy - Upper Outer Quadrant; ER/PR 100%, Her-2 Neu neg.; Ki-67 10%  . Breast surgery      right breast lumpectomy snbx  . Basal cell carcinoma excision    . Squamous cell carcinoma excision    . Colonoscopy w/ polypectomy    . Tear duct probing    . Dilation and curettage of uterus N/A 04/24/2013    Procedure: DILATATION AND CURETTAGE;  Surgeon: Guss Bunde, MD;  Location: [facility];  Service: Gynecology;  Laterality: N/A;  . Hysteroscopy N/A 04/24/2013    Procedure: HYSTEROSCOPY;  Surgeon: Guss Bunde, MD;  Location: [facility];  Service: Gynecology;  Laterality: N/A;    Past Medical Hx:  Past Medical History  Diagnosis Date  . GERD (gastroesophageal reflux disease)   . Hyperlipidemia   . Shingles 2005  . Hypertension   . History of cerebrovascular accident   . Bell's palsy 12/84 (approx)  . Cancer 10/28/2009    squamous cell - upper right arm  . Cancer 10/28/09    basal cell -upper right back  . Change in vision   . Stroke 09/2007  . Hiatal hernia 2008  . Bradycardia   . Polyp  of colon   . Cataract   . Macular degeneration   . Vocal cord polyp   . Whooping cough   . Measles   . Mumps     Oncology Hx:    Breast cancer, female   03/17/2011 Initial Diagnosis Breast cancer, female    Endometrial cancer   04/25/2013 Initial Diagnosis Endometrial cancer    Family Hx:  Family History  Problem Relation Age of Onset  . Cancer Sister 83    Lymphoma  . Cancer Paternal Aunt     Breast cancer  .  Cancer Daughter 19    Bilateral Breast Cancer  . Cancer Daughter     Breast Cancer  . Cancer Sister     2005 - Renal Cell Cancer and Breast Cancer  . Heart disease Father     Vitals:  Blood pressure 147/73, pulse 52, temperature 97.6 F (36.4 C), resp. rate 20, height _0  (1.702 m), weight 195 lb 12.8 oz (88.814 kg).  Physical Exam: Well-nourished well-developed female in no acute distress.  Neck: Supple, no lymphadenopathy no thyromegaly.  Lungs: Clear to auscultation bilaterally.  Cardiovascular: Regular rate and rhythm.  Abdomen: Well-healed laparoscopic incisions. Abdomen is soft, nontender, nondistended. There are no palpable masses or hepatosplenomegaly.  Groins: No lymphadenopathy.  Extremities: No edema.  Pelvic: External genitalia atrophic. The vagina is markedly atrophic the cervix is visualized. There are no visible lesions. There is scant [employer] discharge. Bimanual examination reveals the uterus to be of normal size, shape, and consistency. There are no adnexal masses. Rectal confirms.  Assessment/Plan: 80 year old with a grade 1 endometrioid carcinoma. Clinically has stage I disease. 20 minutes face to face time was spent with the patient, her husband and her daughter. We discussed her endometrial cancer. We discussed that the recommendations would involve hysterectomy with removal of the uterus, cervix, and bilateral adnexa. We discussed that the uterus to be sent for frozen section. If frozen section confirms a grade 1 lesion with less than  50% myometrial invasion, she would not need any additional procedures. If at the time of frozen section there was either greater than 50% myometrial invasion or high-grade disease she would require at least pelvic lymph node assessment and potentially periaortic lymph node dissection. I discussed with them that I think the likelihood of that is quite low.  I believe we can perform her surgery in a minimally invasive fashion we discussed robotic surgery. The patient has had a laparoscopic cholecystectomy so she's aware of the recovery from that. Risks of surgery including but not limited to bleeding, infection, injury to surrounding organs, thromboembolic disease and recurrent stroke were discussed with the patient. I have contacted Dr. Madilyn Fireman for recommendations regarding the management of her Plavix and aspirin in the preoperative period I have also asked her to comment on any other preoperative workup for evaluation I would be recommended for this patient. I discussed with patient and her family that if she does well postoperatively typically she's only in the hospital one night. They understand if we need to proceed with laparotomy that will extend the risk of complications as well as her length of stay.  Their questions were elicited in answer to their satisfaction. She is tentatively scheduled for May 12 hear in [place] with Dr. Janie Morning. She was offered sooner date and [facility] as she does not want to wait. However, they do not want to go to [facility] for surgery and would rather have her surgery in [place].  She has our contact information. She'll call us if they have any questions. We discussed the routine preoperative process our practice and she will be scheduled for preop visit. Urie Loughner A. Alycia Rossetti, MD 05/22/2013, 9:46 AM

## 2013-06-11 NOTE — OR Nursing (Signed)
Supraumbilical port not created.charted in error.

## 2013-06-11 NOTE — Anesthesia Preprocedure Evaluation (Addendum)
Anesthesia Evaluation  Patient identified by MRN, date of birth, ID band Patient awake    Reviewed: Allergy & Precautions, H&P , NPO status , Patient's Chart, lab work & pertinent test results  Airway Mallampati: II TM Distance: >3 FB Neck ROM: full    Dental no notable dental hx. (+) Teeth Intact, Dental Advisory Given   Pulmonary neg pulmonary ROS, former smoker,  Vocal cord polyp. 30 py former smoker breath sounds clear to auscultation  Pulmonary exam normal       Cardiovascular Exercise Tolerance: Good hypertension, Pt. on medications Rhythm:regular Rate:Normal  bradycardia   Neuro/Psych Memory issues from stroke CVA, Residual Symptoms negative neurological ROS  negative psych ROS   GI/Hepatic negative GI ROS, Neg liver ROS, hiatal hernia, GERD-  Medicated and Controlled,  Endo/Other  negative endocrine ROS  Renal/GU negative Renal ROS  negative genitourinary   Musculoskeletal   Abdominal   Peds  Hematology negative hematology ROS (+)   Anesthesia Other Findings   Reproductive/Obstetrics negative OB ROS                          Anesthesia Physical Anesthesia Plan  ASA: III  Anesthesia Plan: General   Post-op Pain Management:    Induction: Intravenous  Airway Management Planned: Oral ETT  Additional Equipment:   Intra-op Plan:   Post-operative Plan: Extubation in OR  Informed Consent: I have reviewed the patients History and Physical, chart, labs and discussed the procedure including the risks, benefits and alternatives for the proposed anesthesia with the patient or authorized representative who has indicated his/her understanding and acceptance.   Dental Advisory Given  Plan Discussed with: CRNA and Surgeon  Anesthesia Plan Comments:         Anesthesia Quick Evaluation

## 2013-06-11 NOTE — Interval H&P Note (Signed)
History and Physical Interval Note:  06/11/2013 11:35 AM  Michelle [name]  has presented today for surgery, with the diagnosis of ENDOMETRIAL ADENOCARCINOMA  The various methods of treatment have been discussed with the patient and family. After consideration of risks, benefits and other options for treatment, the patient has consented to  Procedure(s): ROBOTIC ASSISTED TOTAL HYSTERECTOMY WITH BILATERAL SALPINGO OOPHORECTOMY WITH POSSIBLE STAGING (Bilateral) as a surgical intervention .  The patient's history has been reviewed, patient examined, no change in status, stable for surgery.  I have reviewed the patient's chart and labs.  Questions were answered to the patient's satisfaction.     Michelle [name]

## 2013-06-11 NOTE — Anesthesia Postprocedure Evaluation (Signed)
  Anesthesia Post-op Note  Patient: Michelle [name]  Procedure(s) Performed: Procedure(s) (LRB): ROBOTIC ASSISTED TOTAL HYSTERECTOMY WITH BILATERAL SALPINGO OOPHORECTOMY WITH POSSIBLE STAGING, EPISIOTOMY (Bilateral)  Patient Location: PACU  Anesthesia Type: General  Level of Consciousness: awake and alert   Airway and Oxygen Therapy: Patient Spontanous Breathing  Post-op Pain: mild  Post-op Assessment: Post-op Vital signs reviewed, Patient's Cardiovascular Status Stable, Respiratory Function Stable, Patent Airway and No signs of Nausea or vomiting  Last Vitals:  Filed Vitals:   06/11/13 1641  BP:   Pulse: 45  Temp:   Resp: 16    Post-op Vital Signs: stable   Complications: No apparent anesthesia complications

## 2013-06-12 ENCOUNTER — Encounter (HOSPITAL_COMMUNITY): Payer: Self-pay | Admitting: Gynecologic Oncology

## 2013-06-12 LAB — BASIC METABOLIC PANEL
BUN: 14 mg/dL (ref 6–23)
CALCIUM: 8.9 mg/dL (ref 8.4–10.5)
CHLORIDE: 105 meq/L (ref 96–112)
CO2: 23 mEq/L (ref 19–32)
CREATININE: 0.87 mg/dL (ref 0.50–1.10)
GFR calc non Af Amer: 62 mL/min — ABNORMAL LOW (ref >90)
GFR, EST AFRICAN AMERICAN: 72 mL/min — AB (ref >90)
Glucose, Bld: 142 mg/dL — ABNORMAL HIGH (ref 70–99)
Potassium: 4.5 mEq/L (ref 3.7–5.3)
Sodium: 139 mEq/L (ref 137–147)

## 2013-06-12 MED ORDER — OXYCODONE-ACETAMINOPHEN 5-325 MG PO TABS: 1.0000 | ORAL_TABLET | ORAL | Status: DC | PRN

## 2013-06-12 MED ORDER — KETOROLAC TROMETHAMINE 15 MG/ML IJ SOLN: 15.0000 mg | Freq: Four times a day (QID) | INTRAMUSCULAR | Status: DC

## 2013-06-12 MED ORDER — CLOPIDOGREL BISULFATE 75 MG PO TABS: 75.0000 mg | ORAL_TABLET | Freq: Every day | ORAL | Status: DC

## 2013-06-12 NOTE — Progress Notes (Signed)
Patient urinary output 30 cc or greater as documented on flow sheet. For last 8 hour shift patient with 305 cc of urine through foley cath.  Foley left in place at this time in order to receive accurate i&o record.  Will report the following to oncoming shift for follow up.

## 2013-06-12 NOTE — Discharge Summary (Signed)
Physician Discharge Summary  Patient ID: Michelle [name] MRN: [id number] DOB/AGE: February 23, 1932 80 y.o.  Admit date: 06/11/2013 Discharge date: 06/12/2013  Admission Diagnoses: Endometrial cancer  Discharge Diagnoses:  Principal Problem:   Endometrial cancer   Discharged Condition:  The patient is in good condition and stable for discharge.    Hospital Course: On 06/11/2013, the patient underwent the following: Procedure(s): ROBOTIC ASSISTED TOTAL HYSTERECTOMY WITH BILATERAL SALPINGO OOPHORECTOMY WITH POSSIBLE STAGING, EPISIOTOMY.  The postoperative course was uneventful.  She was discharged to home on postoperative day 1 tolerating a regular diet, ambulating without difficulty, and voiding without difficulty.  Mild discomfort in her left leg and groin improving with movement.  Consults: None  Significant Diagnostic Studies: None  Treatments: surgery: see above  Discharge Exam: Blood pressure 101/64, pulse 48, temperature 98.3 F (36.8 C), temperature source Oral, resp. rate 18, height 5\' 7"  (1.702 m), weight 197 lb 15.6 oz (89.8 kg), SpO2 97.00%. General appearance: alert, cooperative and no distress Resp: clear to auscultation bilaterally Cardio: regular rate and rhythm, S1, S2 normal, no murmur, click, rub or gallop GI: soft, non-tender; bowel sounds normal; no masses,  no organomegaly Extremities: extremities normal, atraumatic, no cyanosis or edema Incision/Wound: Lap sites to the abdomen lightly bruised with steri strips, no drainage or erythema  Disposition: 01-Home or Self Care      Discharge Orders   Future Appointments Provider Department Dept Phone   08/08/2013 1:45 PM Janie Morning, MD [facility] [phone]   11/19/2013 1:30 PM Hali Marry, MD [facility] [place] [phone]   Future Orders Complete By Expires   Call MD for:  difficulty breathing, headache or visual disturbances  As directed     Call MD for:  extreme fatigue  As directed    Call MD for:  hives  As directed    Call MD for:  persistant dizziness or light-headedness  As directed    Call MD for:  persistant nausea and vomiting  As directed    Call MD for:  redness, tenderness, or signs of infection (pain, swelling, redness, odor or green/yellow discharge around incision site)  As directed    Call MD for:  severe uncontrolled pain  As directed    Call MD for:  temperature >100.4  As directed    Diet - low sodium heart healthy  As directed    Driving Restrictions  As directed    Increase activity slowly  As directed    Lifting restrictions  As directed    Sexual Activity Restrictions  As directed        Medication List         aspirin 325 MG tablet  Take 325 mg by mouth at bedtime.     clopidogrel 75 MG tablet  Commonly known as:  PLAVIX  Take 1 tablet (75 mg total) by mouth daily with breakfast.  Start taking on:  06/13/2013     fluticasone 50 MCG/ACT nasal spray  Commonly known as:  FLONASE  Place into both nostrils daily.     lansoprazole 15 MG capsule  Commonly known as:  PREVACID  Take 30 mg by mouth daily at 12 noon.     losartan 25 MG tablet  Commonly known as:  COZAAR  Take 25 mg by mouth every morning.     oxyCODONE-acetaminophen 5-325 MG per tablet  Commonly known as:  PERCOCET/ROXICET  Take 1-2 tablets by mouth every 4 (four) hours as  needed for severe pain (moderate to severe pain).     PRESERVISION AREDS 2 PO  Take 1 capsule by mouth 2 (two) times daily.     rosuvastatin 10 MG tablet  Commonly known as:  CRESTOR  Take 10 mg by mouth every other day.       Follow-up Information   Follow up with Janie Morning, MD On 08/06/2013. (at 1:45pm at the [facility])    Specialty:  Obstetrics and Gynecology   Contact information:   [place] [address] [phone]       Greater than thirty minutes were spend for face to face discharge instructions and discharge  orders/summary in EPIC.   Signed: Dorothyann Gibbs 06/12/2013, 5:29 PM

## 2013-06-12 NOTE — Discharge Instructions (Signed)
06/12/2013  Return to work: 4-6 weeks if applicable  Activity: 1. Be up and out of the bed during the day.  Take a nap if needed.  You may walk up steps but be careful and use the hand rail.  Stair climbing will tire you more than you think, you may need to stop part way and rest.   2. No lifting or straining for 6 weeks.  3. No driving for 1 week.  Do not drive if you are taking narcotic pain medicine.  4. Shower daily.  Use soap and water on your incision and pat dry; don't rub.   5. No sexual activity and nothing in the vagina for 8 weeks.  Diet: 1. Low sodium Heart Healthy Diet is recommended.  2. It is safe to use a laxative if you have difficulty moving your bowels.   Wound Care: 1. Keep clean and dry.  Shower daily.  Reasons to call the Doctor:  Fever - Oral temperature greater than 100.4 degrees Fahrenheit  Foul-smelling vaginal discharge  Difficulty urinating  Nausea and vomiting  Increased pain at the site of the incision that is unrelieved with pain medicine.  Difficulty breathing with or without chest pain  New calf pain especially if only on one side  Sudden, continuing increased vaginal bleeding with or without clots.   Contacts: For questions or concerns you should contact:  Dr. [name] at [phone]  Dr. Skeet Latch at [facility]  Joylene John, NP at [phone]

## 2013-06-12 NOTE — Progress Notes (Signed)
Patient urine lighter in color. Encouraged fluid, patient drank x2 cup of water and received IV bolus.  Urine output 100 for 3 hours.  Will continue to monitor for urinary output to pick up volume.

## 2013-06-13 ENCOUNTER — Telehealth: Payer: Self-pay | Admitting: Gynecologic Oncology

## 2013-06-13 NOTE — Telephone Encounter (Signed)
Pt notified of final path results.  No concerns voiced.  Advised to call for any questions or concerns.

## 2013-06-25 ENCOUNTER — Telehealth: Payer: Self-pay | Admitting: *Deleted

## 2013-06-25 NOTE — Telephone Encounter (Signed)
Pt called states " I have trouble with numbness in my Left upperleg/top of my thigh and my Left groin area. I have been taking a tylenol before bed and then I'll wake up between 305am and  I'm not able to get back to sleep because I'm in pain and cannot get comfortable. It usually gets better if I get out of bed and move around, used a heating pad this am and it felt better but it's not getting better." IS there anything I can take or do to help this go away? Discussed with pt I will review with MD and call her with further instruction. Pt verbalized understanding.

## 2013-06-26 NOTE — Telephone Encounter (Signed)
Called pt to advise MD will speak with her today. Pt verbalized she took two tylenol last night ans was able to get some rest, and feels better.

## 2013-06-28 ENCOUNTER — Encounter: Payer: Self-pay | Admitting: Family Medicine

## 2013-06-28 ENCOUNTER — Other Ambulatory Visit: Payer: Self-pay | Admitting: Family Medicine

## 2013-06-28 ENCOUNTER — Other Ambulatory Visit: Payer: Self-pay

## 2013-06-28 ENCOUNTER — Other Ambulatory Visit: Payer: Self-pay | Admitting: Gynecologic Oncology

## 2013-06-28 MED ORDER — LOSARTAN POTASSIUM 25 MG PO TABS: 25.0000 mg | ORAL_TABLET | Freq: Every morning | ORAL | Status: DC

## 2013-07-02 ENCOUNTER — Ambulatory Visit: Payer: Medicare Other | Admitting: Gynecologic Oncology

## 2013-07-02 ENCOUNTER — Encounter: Payer: Self-pay | Admitting: Family Medicine

## 2013-07-02 ENCOUNTER — Ambulatory Visit (INDEPENDENT_AMBULATORY_CARE_PROVIDER_SITE_OTHER): Payer: Medicare Other | Admitting: Family Medicine

## 2013-07-02 ENCOUNTER — Telehealth: Payer: Self-pay | Admitting: *Deleted

## 2013-07-02 VITALS — BP 116/67 | HR 62 | Wt 196.0 lb

## 2013-07-02 DIAGNOSIS — IMO0002 Reserved for concepts with insufficient information to code with codable children: Secondary | ICD-10-CM

## 2013-07-02 DIAGNOSIS — S76012A Strain of muscle, fascia and tendon of left hip, initial encounter: Secondary | ICD-10-CM

## 2013-07-02 DIAGNOSIS — M47818 Spondylosis without myelopathy or radiculopathy, sacral and sacrococcygeal region: Secondary | ICD-10-CM

## 2013-07-02 DIAGNOSIS — M47817 Spondylosis without myelopathy or radiculopathy, lumbosacral region: Secondary | ICD-10-CM

## 2013-07-02 MED ORDER — MELOXICAM 7.5 MG PO TABS: 7.5000 mg | ORAL_TABLET | Freq: Every day | ORAL | Status: DC | PRN

## 2013-07-02 MED ORDER — TIZANIDINE HCL 4 MG PO TABS: 2.0000 mg | ORAL_TABLET | Freq: Three times a day (TID) | ORAL | Status: DC | PRN

## 2013-07-02 NOTE — Telephone Encounter (Signed)
Pt friend Christen Bame on behalf of pt, called regarding today's appt for pt. Discussed with Johnetta, appt scheduled as MD would be in the office and able to see her to reevaluate the leg/groin concerns pt was having. Johnetta advised pt was seen by Dr. Madilyn Fireman today who addressed pt's concerns. Appt cancelled/r/s post op check for jun 25 per her request. No further concerns at this time.

## 2013-07-02 NOTE — Progress Notes (Signed)
   Subjective:    Patient ID: Michelle [name], female    DOB: 1932/04/25, 80 y.o.   MRN: [id number]  HPI She complains of pain x 3 weeks that start in the left groin and shots to her left low back area near the spine between her hip and the spine. Over-the-counter treatments include heat and tylenol.  No ice. h Waking her up multiple times at night.  Throbs some at times. Her hysterectomy about 3 weeks ago. Using some oxycodone as well at night.  No pain over the trochanteric bursa.     Review of Systems     Objective:   Physical Exam  Constitutional: She is oriented to person, place, and time. She appears well-developed and well-nourished.  HENT:  Head: Normocephalic and atraumatic.  Musculoskeletal:  Mildly tender over the lower lumbar spine. She's very tender over the left SI joint and surrounding area. Mildly tender over the right SI joint. Hip flexion and extension internal and external rotation are normal and are not painful. There when she did try to flex her hip against resistance she did have some discomfort. Knee and ankle strength 5 out of 5 bilaterally. Patellar reflexes 1+ bilaterally.  Neurological: She is alert and oriented to person, place, and time.  Skin: Skin is warm and dry.  Psychiatric: She has a normal mood and affect. Her behavior is normal.          Assessment & Plan:  SI joint pain - recommend Mobic for inflammation. Can take twice a day for the first couple days if needed. Make sure take with food and water to avoid any GI upset or irritation. I discussed prednisone as an option she taken years ago and did not feel well on it. We'll also add a muscle relaxer. Warned about potential for sedation and to stop it immediately if she does not feel well on it. We'll give her handout for hip flexor strain and 4 SI joint inflammation. If she's not improving over 3 weeks. She might benefit from SI joint injection.  Hip flexion strain - please see note above. I expect a 50%  reduction in symptoms over 3 weeks. If she does not reach the skull the recommend she see a sports medicine doctor, Dr. Aundria Mems for further evaluation and treatment.

## 2013-07-24 ENCOUNTER — Telehealth: Payer: Self-pay | Admitting: Gynecologic Oncology

## 2013-07-24 NOTE — Telephone Encounter (Signed)
GYN ONC OFFICE NOTE  Michelle [name] 80 y.o. female  CC:  Chief Complaint  Patient presents with  . Abstract   Assessment/Plan: 80 y.o. with a grade 1 endometrioid carcinoma. S/P Robotic TAH BSO 06/11/13.  Stage IA grade 1 endometrial cancer  Follow upwith Gyn Onc in 6 months Follow up with Dr. Silas Sacramento in 12 months Annual pap with Dr.  Lottie [name]  Imaging based upon clinical symptoms  HPI:  Patient is a 80 y.o.  gravida 2 para 2 has been menopausal for 25+ years. Late in November 2014 she began experiencing a dark vaginal discharge. In January of 2015 she began having some bleeding and spotting. Attempt was made at an endometrial biopsy in the office. However, secondary to her anatomy it could not be performed. She subsequently underwent a D&C on 04/25/2013. Findings are consistent with a grade 1 endometrioid adenocarcinoma. She did have a transvaginal ultrasound on March 5. It revealed the uterus to be 3 x 3.8 x 4.7 cm with no fibroids. There is a thickened endometrial stripe of 1 cm. The right adnexa could not be seen. The left adnexa measured 1.2 x 1.7 x 2.4 cm and was unremarkable.   She's overall doing fairly well. She states that she's had 2-3 episodes of spotting since her D&C. She denies any pain. She's had no change in her bowel or bladder habits. She did have a stroke in August of 2009. She does have some memory loss issues. She's not sure to [place] because of the stroke her age. She otherwise has no neurologic motor or sensory deficits. She is followed by Dr.Metheney. She is up-to-date on her mammograms. Should a colonoscopy in 2014. Her family history is remarkable that she has a personal history of breast cancer. Her sister also had breast cancer. She has 2 daughters currently aged 22 and 55. One daughter had breast cancer at age of 20. The other daughter had her first breast cancer at age of 78 and a recurrence of 35. They have undergone BRCA testing and they are BRCA negative.  One daughter underwent Lynch testing and similarly was negative for that as well.  She underwent [place] BSO BPLND 06/11/2013.  Path: 1. ONCOLOGY TABLE-UTERUS, CARCINOMA Specimen: Uterus, cervix and bilateral fallopian tubes and ovaries Procedure: Total hysterectomy and bilateral salpingo-oophorectomy Lymph node sampling performed: yes Specimen integrity: intact Maximum tumor size: 2.0 cm Histologic type: Invasive endometrioid carcinoma Grade: FIGO grade I Myometrial invasion: 0.5 cm where myometrium is 1.6 cm in thickness Cervical stromal involvement: No involved Extent of involvement of other organs: No Lymph - vascular invasion: Not identified Peritoneal washings: N/A Lymph nodes: # examined 13 ; # positive 0 Pelvic lymph nodes: 0 involved of 13 lymph nodes.  Review of Systems:  Constitutional: No unintentional weight loss or weight gain. Denies fever. Skin: No rash Cardiovascular: No chest pain, shortness of breath, or edema. Does experience some shortness going up a steep flight of stairs but recovers well. She is limited in terms of pain in her knees. She knows that she needs to do more exercise. She does her ADLs without difficulty. Pulmonary: No cough or wheeze.  Gastro Intestinal:  No nausea, vomiting, constipation, or diarrhea reported. No bright red blood per rectum or change in bowel movement.  Genitourinary: No frequency, urgency, or dysuria.  Denies vaginal bleeding and discharge.  Musculoskeletal: No myalgia, arthralgia, joint swelling or pain.  Neurologic: No weakness, numbness, or change in gait.  Psychology: Memory decline  Social Hx:   History   Social History  . Marital Status: Married    Spouse Name: N/A    Number of Children: N/A  . Years of Education: N/A   Occupational History  . retired    Social History Main Topics  . Smoking status: Former Smoker -- 1.00 packs/day for 30 years    Quit date: 02/01/1996  . Smokeless tobacco: Never Used  .  Alcohol Use: Yes     Comment: rare  . Drug Use: No  . Sexual Activity: Yes    Partners: Male     Comment: Menarche age 57, menopause age 64, HRT x 1year   Other Topics Concern  . Not on file   Social History Narrative  . No narrative on file    Past Surgical Hx:  Past Surgical History  Procedure Laterality Date  . Cholecystectomy  2004  . Biospy  10/28/09    Shave Biospy skin Left hand(Acitinic Keratoses), Right Upper Arm (superficial Basal Cell), Right Upper Back(Superficial Basal Cell), Left Neck ( Solar Lentigo & Seborrheic Keratoses)  . Vocal cord poylps  1969    excision  . Tonsillectomy    . Colonoscopy    . Breast mammosite  03/14/2011    Procedure: MAMMOSITE BREAST;  Surgeon: Rolm Bookbinder, MD;  Location: [facility];  Service: General;  Laterality: Right;  . Breast biopsy  02/11/11    Right Breast Needle Core Biospy - Upper Outer Quadrant; ER/PR 100%, Her-2 Neu neg.; Ki-67 10%  . Breast surgery      right breast lumpectomy snbx  . Basal cell carcinoma excision    . Squamous cell carcinoma excision    . Colonoscopy w/ polypectomy    . Tear duct probing    . Dilation and curettage of uterus N/A 04/24/2013    Procedure: DILATATION AND CURETTAGE;  Surgeon: Guss Bunde, MD;  Location: [facility];  Service: Gynecology;  Laterality: N/A;  . Hysteroscopy N/A 04/24/2013    Procedure: HYSTEROSCOPY;  Surgeon: Guss Bunde, MD;  Location: [facility];  Service: Gynecology;  Laterality: N/A;  . Robotic assisted total hysterectomy with bilateral salpingo oopherectomy Bilateral 06/11/2013    Procedure: ROBOTIC ASSISTED TOTAL HYSTERECTOMY WITH BILATERAL SALPINGO OOPHORECTOMY WITH POSSIBLE STAGING, EPISIOTOMY;  Surgeon: Janie Morning, MD;  Location: [facility];  Service: Gynecology;  Laterality: Bilateral;    Past Medical Hx:  Past Medical History  Diagnosis Date  . GERD (gastroesophageal reflux disease)   . Hyperlipidemia   . Shingles 2005  . Hypertension   . History  of cerebrovascular accident   . Hiatal hernia 2008  . Bradycardia   . Polyp of colon   . Cataract   . Macular degeneration   . Vocal cord polyp   . Stroke 09/2007    Slight memory problems residual  . Tendonitis     rt hand  . History of breast cancer   . Cancer 10/28/2009    squamous cell - upper right arm  . Cancer 10/28/09    basal cell -upper right back  . Breast cancer   . Endometrial cancer   . Endometrial cancer     Oncology Hx:    Breast cancer, female   03/17/2011 Initial Diagnosis Breast cancer, female    Endometrial cancer   04/25/2013 Initial Diagnosis Endometrial cancer    Family Hx:  Family History  Problem Relation Age of Onset  . Cancer Sister 26    Lymphoma  . Cancer Paternal Aunt  Breast cancer  . Cancer Daughter 12    Bilateral Breast Cancer  . Cancer Daughter     Breast Cancer  . Cancer Sister     2005 - Renal Cell Cancer and Breast Cancer  . Heart disease Father     Vitals:  Blood pressure 147/73, pulse 52, temperature 97.6 F (36.4 C), resp. rate 20, height 5' 7"  (1.702 m), weight 195 lb 12.8 oz (88.814 kg).  Physical Exam: Well-nourished well-developed female in no acute distress.  Neck: Supple, no lymphadenopathy no thyromegaly.  Lungs: Clear to auscultation bilaterally.  Cardiovascular: Regular rate and rhythm.  Abdomen: Well-healed laparoscopic incisions. Abdomen is soft, nontender, nondistended. There are no palpable masses or hepatosplenomegaly.  Groins: No lymphadenopathy.  Extremities: No edema.  Pelvic: External genitalia atrophic. The vagina is markedly atrophic the cervix is visualized. There are no visible lesions. There is scant [employer] discharge. Bimanual examination reveals the uterus to be of normal size, shape, and consistency. There are no adnexal masses. Rectal confirms.

## 2013-07-25 ENCOUNTER — Ambulatory Visit: Payer: Medicare Other

## 2013-07-25 ENCOUNTER — Encounter: Payer: Self-pay | Admitting: Gynecologic Oncology

## 2013-07-25 ENCOUNTER — Ambulatory Visit: Payer: Medicare Other | Attending: Gynecologic Oncology | Admitting: Gynecologic Oncology

## 2013-07-25 VITALS — BP 146/68 | HR 66 | Temp 98.4°F | Resp 18 | Ht 67.0 in | Wt 193.1 lb

## 2013-07-25 DIAGNOSIS — R5383 Other fatigue: Secondary | ICD-10-CM

## 2013-07-25 DIAGNOSIS — Z853 Personal history of malignant neoplasm of breast: Secondary | ICD-10-CM | POA: Insufficient documentation

## 2013-07-25 DIAGNOSIS — Z8542 Personal history of malignant neoplasm of other parts of uterus: Secondary | ICD-10-CM

## 2013-07-25 DIAGNOSIS — R5381 Other malaise: Secondary | ICD-10-CM | POA: Insufficient documentation

## 2013-07-25 DIAGNOSIS — C541 Malignant neoplasm of endometrium: Secondary | ICD-10-CM

## 2013-07-25 DIAGNOSIS — K219 Gastro-esophageal reflux disease without esophagitis: Secondary | ICD-10-CM | POA: Insufficient documentation

## 2013-07-25 DIAGNOSIS — Z803 Family history of malignant neoplasm of breast: Secondary | ICD-10-CM | POA: Insufficient documentation

## 2013-07-25 DIAGNOSIS — Z90710 Acquired absence of both cervix and uterus: Secondary | ICD-10-CM | POA: Insufficient documentation

## 2013-07-25 DIAGNOSIS — I10 Essential (primary) hypertension: Secondary | ICD-10-CM | POA: Insufficient documentation

## 2013-07-25 DIAGNOSIS — Z8673 Personal history of transient ischemic attack (TIA), and cerebral infarction without residual deficits: Secondary | ICD-10-CM | POA: Insufficient documentation

## 2013-07-25 DIAGNOSIS — Z87891 Personal history of nicotine dependence: Secondary | ICD-10-CM | POA: Insufficient documentation

## 2013-07-25 DIAGNOSIS — C549 Malignant neoplasm of corpus uteri, unspecified: Secondary | ICD-10-CM | POA: Insufficient documentation

## 2013-07-25 DIAGNOSIS — E785 Hyperlipidemia, unspecified: Secondary | ICD-10-CM | POA: Insufficient documentation

## 2013-07-25 LAB — CBC WITH DIFFERENTIAL/PLATELET
BASO%: 1.4 % (ref 0.0–2.0)
Basophils Absolute: 0.1 10*3/uL (ref 0.0–0.1)
EOS%: 4 % (ref 0.0–7.0)
Eosinophils Absolute: 0.3 10*3/uL (ref 0.0–0.5)
HCT: 37.2 % (ref 34.8–46.6)
HGB: 12.4 g/dL (ref 11.6–15.9)
LYMPH%: 18.5 % (ref 14.0–49.7)
MCH: 33 pg (ref 25.1–34.0)
MCHC: 33.4 g/dL (ref 31.5–36.0)
MCV: 98.7 fL (ref 79.5–101.0)
MONO#: 0.7 10*3/uL (ref 0.1–0.9)
MONO%: 8.1 % (ref 0.0–14.0)
NEUT#: 5.9 10*3/uL (ref 1.5–6.5)
NEUT%: 68 % (ref 38.4–76.8)
Platelets: 225 10*3/uL (ref 145–400)
RBC: 3.76 10*6/uL (ref 3.70–5.45)
RDW: 12.4 % (ref 11.2–14.5)
WBC: 8.7 10*3/uL (ref 3.9–10.3)
lymph#: 1.6 10*3/uL (ref 0.9–3.3)

## 2013-07-25 LAB — BUN AND CREATININE (CC13)
BUN: 10.6 mg/dL (ref 7.0–26.0)
Creatinine: 1 mg/dL (ref 0.6–1.1)

## 2013-07-25 NOTE — Progress Notes (Signed)
GYN ONC OFFICE NOTE  Michelle [name] 79 y.o. female  CC:  Chief Complaint  Patient presents with  . ENDO CA    Follow up    Assessment/Plan: 80 y.o. with a grade 1 endometrioid carcinoma. S/P Robotic TAH BSO 06/11/13.  Stage IA grade 1 endometrial cancer  Follow upwith Gyn Onc in 6 months Follow up with Dr. Silas [place] in 12 months Annual pap with Dr.  [name]   Fatigue and malaise Unusual  That she feels so poorly  6 weeks after staging.  Will check CBC and CT abdomen and pelvis to evaluate.  HPI:  Patient is a 80 y.o.  gravida 2 para 2 has been menopausal for 25+ years. Late in November 2014 she began experiencing a dark vaginal discharge. In January of 2015 she began having some bleeding and spotting. Attempt was made at an endometrial biopsy in the office. However, secondary to her anatomy it could not be performed. She subsequently underwent a D&C on 04/25/2013. Findings are consistent with a grade 1 endometrioid adenocarcinoma. She did have a transvaginal ultrasound on March 5. It revealed the uterus to be 3 x 3.8 x 4.7 cm with no fibroids. There is a thickened endometrial stripe of 1 cm. The right adnexa could not be seen. The left adnexa measured 1.2 x 1.7 x 2.4 cm and was unremarkable.   She's overall doing fairly well. She states that she's had 2-3 episodes of spotting since her D&C. She denies any pain. She's had no change in her bowel or bladder habits. She did have a stroke in August of 2009. She does have some memory loss issues. She's not sure to [place] because of the stroke her age. She otherwise has no neurologic motor or sensory deficits. She is followed by Dr.Metheney. She is up-to-date on her mammograms. Should a colonoscopy in 2014. Her family history is remarkable that she has a personal history of breast cancer. Her sister also had breast cancer. She has 2 daughters currently aged 23 and 88. One daughter had breast cancer at age of 70. The other daughter had her first  breast cancer at age of 30 and a recurrence of 25. They have undergone BRCA testing and they are BRCA negative. One daughter underwent Lynch testing and similarly was negative for that as well.  She underwent [place] BSO BPLND 06/11/2013.  Path: 1. ONCOLOGY TABLE-UTERUS, CARCINOMA Specimen: Uterus, cervix and bilateral fallopian tubes and ovaries Procedure: Total hysterectomy and bilateral salpingo-oophorectomy Lymph node sampling performed: yes Specimen integrity: intact Maximum tumor size: 2.0 cm Histologic type: Invasive endometrioid carcinoma Grade: FIGO grade I Myometrial invasion: 0.5 cm where myometrium is 1.6 cm in thickness Cervical stromal involvement: No involved Extent of involvement of other organs: No Lymph - vascular invasion: Not identified Peritoneal washings: N/A Lymph nodes: # examined 13 ; # positive 0 Pelvic lymph nodes: 0 involved of 13 lymph nodes.  Feels poorly, RLQ pain, no nausea or vomiting. No back pain, no incontinence, no fever.   Social Hx:   History   Social History  . Marital Status: Married    Spouse Name: N/A    Number of Children: N/A  . Years of Education: N/A   Occupational History  . retired    Social History Main Topics  . Smoking status: Former Smoker -- 1.00 packs/day for 30 years    Quit date: 02/01/1996  . Smokeless tobacco: Never Used  . Alcohol Use: 0.6 oz/week    1 Glasses of  wine per week     Comment: Ocassionaly once a year .  Marland Kitchen Drug Use: No  . Sexual Activity: Yes    Partners: Male     Comment: Menarche age 52, menopause age 70, HRT x 1year   Other Topics Concern  . Not on file   Social History Narrative  . No narrative on file    Past Surgical Hx:  Past Surgical History  Procedure Laterality Date  . Cholecystectomy  2004  . Biospy  10/28/09    Shave Biospy skin Left hand(Acitinic Keratoses), Right Upper Arm (superficial Basal Cell), Right Upper Back(Superficial Basal Cell), Left Neck ( Solar Lentigo & Seborrheic  Keratoses)  . Vocal cord poylps  1969    excision  . Tonsillectomy    . Colonoscopy    . Breast mammosite  03/14/2011    Procedure: MAMMOSITE BREAST;  Surgeon: Rolm Bookbinder, MD;  Location: [facility];  Service: General;  Laterality: Right;  . Breast biopsy  02/11/11    Right Breast Needle Core Biospy - Upper Outer Quadrant; ER/PR 100%, Her-2 Neu neg.; Ki-67 10%  . Breast surgery      right breast lumpectomy snbx  . Basal cell carcinoma excision    . Squamous cell carcinoma excision    . Colonoscopy w/ polypectomy    . Tear duct probing    . Dilation and curettage of uterus N/A 04/24/2013    Procedure: DILATATION AND CURETTAGE;  Surgeon: Guss Bunde, MD;  Location: [facility];  Service: Gynecology;  Laterality: N/A;  . Hysteroscopy N/A 04/24/2013    Procedure: HYSTEROSCOPY;  Surgeon: Guss Bunde, MD;  Location: [facility];  Service: Gynecology;  Laterality: N/A;  . Robotic assisted total hysterectomy with bilateral salpingo oopherectomy Bilateral 06/11/2013    Procedure: ROBOTIC ASSISTED TOTAL HYSTERECTOMY WITH BILATERAL SALPINGO OOPHORECTOMY WITH POSSIBLE STAGING, EPISIOTOMY;  Surgeon: Janie Morning, MD;  Location: [facility];  Service: Gynecology;  Laterality: Bilateral;    Past Medical Hx:  Past Medical History  Diagnosis Date  . GERD (gastroesophageal reflux disease)   . Hyperlipidemia   . Shingles 2005  . Hypertension   . History of cerebrovascular accident   . Hiatal hernia 2008  . Bradycardia   . Polyp of colon   . Cataract   . Macular degeneration   . Vocal cord polyp   . Stroke 09/2007    Slight memory problems residual  . Tendonitis     rt hand  . History of breast cancer   . Cancer 10/28/2009    squamous cell - upper right arm  . Cancer 10/28/09    basal cell -upper right back  . Breast cancer   . Endometrial cancer   . Endometrial cancer     Oncology Hx:    Breast cancer, female   03/17/2011 Initial Diagnosis Breast cancer, female     Endometrial cancer   04/25/2013 Initial Diagnosis Endometrial cancer    Family Hx:  Family History  Problem Relation Age of Onset  . Cancer Sister 15    Lymphoma  . Cancer Paternal Aunt     Breast cancer  . Cancer Daughter 17    Bilateral Breast Cancer  . Cancer Daughter     Breast Cancer  . Cancer Sister     2005 - Renal Cell Cancer and Breast Cancer  . Heart disease Father   Review of Systems:  Constitutional: Felels very tired.  Feels less healthy than before the surgical procedure.. Cardiovascular: No  chest pain, shortness of breath, or edema. Pulmonary: No cough or wheeze.  Gastro Intestinal:  No nausea, vomiting, constipation, or diarrhea reported. Fair appetite, right lower quadrant pain.  Pain is worse when driving on a bumpy road.  No nausea or vomiting.   No bright red blood per rectum or change in bowel movement.  Genitourinary: No frequency, urgency, or dysuria.  Denies vaginal bleeding and discharge.  Musculoskeletal: No myalgia, arthralgia, joint swelling or pain.  Neurologic: No weakness, numbness, or change in gait.  Psychology: Memory decline    Vitals:  Blood pressure 146/68, pulse 66, temperature 98.4 F (36.9 C), temperature source Oral, resp. rate 18, height 5' 7"  (1.702 m), weight 193 lb 1.6 oz (87.59 kg).  Physical Exam: Well-nourished well-developed female in no acute distress.  Neck: Supple, no lymphadenopathy no thyromegaly.  Lungs: Clear to auscultation bilaterally.  Cardiovascular: Regular rate and rhythm.  Abdomen: Well-healed laparoscopic incisions. Abdomen is soft, RLQ tenderness, nondistended. There are no palpable masses or hepatosplenomegaly.  Groins: No lymphadenopathy.  Extremities: No edema.  Pelvic: External genitalia atrophic. The vaginal cuff is intact, no discharge or masses.  No cul de sac nodularity.

## 2013-07-25 NOTE — Patient Instructions (Addendum)
CT scan of the abdomen and pelvis to evaluate abdominal pain  Stage IA grade 1 endometrial cancer  No evidence of disease Follow upwith Gyn Onc in 6 months Follow up with Dr. Silas Sacramento in 12 months Annual pap with Dr.  [name]  Imaging based upon clinical symptoms   Thank you very much Michelle [name] for allowing me to provide care for you today.  I appreciate your confidence in choosing our Gynecologic Oncology team.  If you have any questions about your visit today please call our office and we will get back to you as soon as possible.  Francetta Found. Brewster MD., PhD Gynecologic Oncology

## 2013-07-29 ENCOUNTER — Encounter (HOSPITAL_COMMUNITY): Payer: Self-pay

## 2013-07-29 ENCOUNTER — Ambulatory Visit (HOSPITAL_COMMUNITY)
Admission: RE | Admit: 2013-07-29 | Discharge: 2013-07-29 | Disposition: A | Discharge status: Home / Self Care | Payer: Medicare Other | Source: Ambulatory Visit | Attending: Gynecologic Oncology | Admitting: Gynecologic Oncology

## 2013-07-29 DIAGNOSIS — Z923 Personal history of irradiation: Secondary | ICD-10-CM | POA: Insufficient documentation

## 2013-07-29 DIAGNOSIS — C55 Malignant neoplasm of uterus, part unspecified: Secondary | ICD-10-CM | POA: Insufficient documentation

## 2013-07-29 DIAGNOSIS — R599 Enlarged lymph nodes, unspecified: Secondary | ICD-10-CM | POA: Insufficient documentation

## 2013-07-29 DIAGNOSIS — C541 Malignant neoplasm of endometrium: Secondary | ICD-10-CM

## 2013-07-29 DIAGNOSIS — Z9010 Acquired absence of unspecified breast and nipple: Secondary | ICD-10-CM | POA: Insufficient documentation

## 2013-07-29 DIAGNOSIS — K5900 Constipation, unspecified: Secondary | ICD-10-CM | POA: Insufficient documentation

## 2013-07-29 DIAGNOSIS — N949 Unspecified condition associated with female genital organs and menstrual cycle: Secondary | ICD-10-CM | POA: Insufficient documentation

## 2013-07-29 DIAGNOSIS — C50919 Malignant neoplasm of unspecified site of unspecified female breast: Secondary | ICD-10-CM | POA: Insufficient documentation

## 2013-07-29 DIAGNOSIS — J984 Other disorders of lung: Secondary | ICD-10-CM | POA: Insufficient documentation

## 2013-07-29 DIAGNOSIS — Z90710 Acquired absence of both cervix and uterus: Secondary | ICD-10-CM | POA: Insufficient documentation

## 2013-07-29 DIAGNOSIS — I700 Atherosclerosis of aorta: Secondary | ICD-10-CM | POA: Insufficient documentation

## 2013-07-29 DIAGNOSIS — K5730 Diverticulosis of large intestine without perforation or abscess without bleeding: Secondary | ICD-10-CM | POA: Insufficient documentation

## 2013-07-29 MED ORDER — IOHEXOL 300 MG/ML  SOLN
100.0000 mL | Freq: Once | INTRAMUSCULAR | Status: AC | PRN
  Administered 2013-07-29: 100 mL via INTRAVENOUS

## 2013-07-30 ENCOUNTER — Telehealth: Payer: Self-pay | Admitting: *Deleted

## 2013-07-30 NOTE — Telephone Encounter (Signed)
Message copied by Lucile Crater on Tue Jul 30, 2013 10:44 AM ------      Message from: Janie Morning      Created: Tue Jul 30, 2013 10:29 AM      Regarding: CT scan       Hello Mary:            Could you let her know that there is no obstruction.  A lymph node is enlarged but all her nodes at surgery were negative.  We will repeat the CT in 3 months.  ------

## 2013-07-30 NOTE — Telephone Encounter (Signed)
Called pt regarding scan results. Pt unavailable lmovm

## 2013-08-01 ENCOUNTER — Telehealth: Payer: Self-pay | Admitting: *Deleted

## 2013-08-01 NOTE — Telephone Encounter (Signed)
Contacted pt discussed scan results. Pt verbalized understanding. No further concerns.

## 2013-08-08 ENCOUNTER — Ambulatory Visit: Payer: Medicare Other | Admitting: Gynecologic Oncology

## 2013-08-22 ENCOUNTER — Encounter: Payer: Self-pay | Admitting: Gynecologic Oncology

## 2013-08-22 ENCOUNTER — Telehealth: Payer: Self-pay | Admitting: *Deleted

## 2013-08-22 ENCOUNTER — Ambulatory Visit: Payer: Medicare Other | Attending: Gynecologic Oncology | Admitting: Gynecologic Oncology

## 2013-08-22 VITALS — BP 147/69 | HR 61 | Temp 98.5°F | Resp 16 | Wt 192.4 lb

## 2013-08-22 DIAGNOSIS — E785 Hyperlipidemia, unspecified: Secondary | ICD-10-CM | POA: Diagnosis not present

## 2013-08-22 DIAGNOSIS — I10 Essential (primary) hypertension: Secondary | ICD-10-CM | POA: Diagnosis not present

## 2013-08-22 DIAGNOSIS — H3530 Unspecified macular degeneration: Secondary | ICD-10-CM | POA: Diagnosis not present

## 2013-08-22 DIAGNOSIS — Z803 Family history of malignant neoplasm of breast: Secondary | ICD-10-CM | POA: Diagnosis not present

## 2013-08-22 DIAGNOSIS — C541 Malignant neoplasm of endometrium: Secondary | ICD-10-CM

## 2013-08-22 DIAGNOSIS — Z87891 Personal history of nicotine dependence: Secondary | ICD-10-CM | POA: Insufficient documentation

## 2013-08-22 DIAGNOSIS — K219 Gastro-esophageal reflux disease without esophagitis: Secondary | ICD-10-CM | POA: Insufficient documentation

## 2013-08-22 DIAGNOSIS — Z853 Personal history of malignant neoplasm of breast: Secondary | ICD-10-CM | POA: Diagnosis not present

## 2013-08-22 DIAGNOSIS — Z8673 Personal history of transient ischemic attack (TIA), and cerebral infarction without residual deficits: Secondary | ICD-10-CM | POA: Diagnosis not present

## 2013-08-22 DIAGNOSIS — R599 Enlarged lymph nodes, unspecified: Secondary | ICD-10-CM

## 2013-08-22 DIAGNOSIS — C549 Malignant neoplasm of corpus uteri, unspecified: Secondary | ICD-10-CM | POA: Diagnosis present

## 2013-08-22 DIAGNOSIS — Z90710 Acquired absence of both cervix and uterus: Secondary | ICD-10-CM

## 2013-08-22 DIAGNOSIS — N898 Other specified noninflammatory disorders of vagina: Secondary | ICD-10-CM

## 2013-08-22 NOTE — Addendum Note (Signed)
Addended by: Lucile Crater on: 08/22/2013 04:19 PM   Modules accepted: Orders

## 2013-08-22 NOTE — Progress Notes (Signed)
GYN ONC OFFICE NOTE  Michelle [name] 80 y.o. female  CC:  Chief Complaint  Patient presents with  . Endometrial adenocarcinoma    vaginal bleeding/spotting   Assessment/Plan: 80 y.o. with a grade 1 endometrioid carcinoma. S/P Robotic TAH BSO 06/11/13.  Stage IA grade 1 endometrial cancer  Follow upwith Gyn Onc in 3 months Repeat CT abd/pelvis in 3 months to re evaluate LN  Follow up with Dr. Silas Sacramento in 12 months Annual pap with Dr.  [name]   Vaginal bleeding. Possible passage of old clot caused by plavix.  Cuff intact, no blood or discharge noted.  HPI:  Patient is a 80 y.o.  gravida 2 para 2 has been menopausal for 25+ years. Late in November 2014 she began experiencing a dark vaginal discharge. In January of 2015 she began having some bleeding and spotting. Attempt was made at an endometrial biopsy in the office. However, secondary to her anatomy it could not be performed. She subsequently underwent a D&C on 04/25/2013. Findings are consistent with a grade 1 endometrioid adenocarcinoma. She did have a transvaginal ultrasound on March 5. It revealed the uterus to be 3 x 3.8 x 4.7 cm with no fibroids. There is a thickened endometrial stripe of 1 cm. The right adnexa could not be seen. The left adnexa measured 1.2 x 1.7 x 2.4 cm and was unremarkable.   She's overall doing fairly well. She states that she's had 2-3 episodes of spotting since her D&C. She denies any pain. She's had no change in her bowel or bladder habits. She did have a stroke in August of 2009. She does have some memory loss issues. She's not sure to [place] because of the stroke her age. She otherwise has no neurologic motor or sensory deficits. She is followed by Dr.Metheney. She is up-to-date on her mammograms. Should a colonoscopy in 2014. Her family history is remarkable that she has a personal history of breast cancer. Her sister also had breast cancer. She has 2 daughters currently aged 63 and 35. One daughter had  breast cancer at age of 78. The other daughter had her first breast cancer at age of 45 and a recurrence of 66. They have undergone BRCA testing and they are BRCA negative. One daughter underwent Lynch testing and similarly was negative for that as well.  She underwent [place] BSO BPLND 06/11/2013.  Path: 1. ONCOLOGY TABLE-UTERUS, CARCINOMA Specimen: Uterus, cervix and bilateral fallopian tubes and ovaries Procedure: Total hysterectomy and bilateral salpingo-oophorectomy Lymph node sampling performed: yes Specimen integrity: intact Maximum tumor size: 2.0 cm Histologic type: Invasive endometrioid carcinoma Grade: FIGO grade I Myometrial invasion: 0.5 cm where myometrium is 1.6 cm in thickness Cervical stromal involvement: No involved Extent of involvement of other organs: No Lymph - vascular invasion: Not identified Peritoneal washings: N/A Lymph nodes: # examined 13 ; # positive 0 Pelvic lymph nodes: 0 involved of 13 lymph nodes.  At the post op visit 07/25/2013. Patient reported significant RLQ discomfort, malaise and pain when riding in an SUV.  CT abd/pelvis collected and was notable for diverticulosis and a single enlarged pelvic LN.    Patient now presents with c/o heavy brown vaginal discharge earlier this week.  Reports that she is on plavix.   Social Hx:   History   Social History  . Marital Status: Married    Spouse Name: N/A    Number of Children: N/A  . Years of Education: N/A   Occupational History  . retired  Social History Main Topics  . Smoking status: Former Smoker -- 1.00 packs/day for 30 years    Quit date: 02/01/1996  . Smokeless tobacco: Never Used  . Alcohol Use: 0.6 oz/week    1 Glasses of wine per week     Comment: Ocassionaly once a year .  Marland Kitchen Drug Use: No  . Sexual Activity: Yes    Partners: Male     Comment: Menarche age 63, menopause age 72, HRT x 1year   Other Topics Concern  . Not on file   Social History Narrative  . No narrative on  file    Past Surgical Hx:  Past Surgical History  Procedure Laterality Date  . Cholecystectomy  2004  . Biospy  10/28/09    Shave Biospy skin Left hand(Acitinic Keratoses), Right Upper Arm (superficial Basal Cell), Right Upper Back(Superficial Basal Cell), Left Neck ( Solar Lentigo & Seborrheic Keratoses)  . Vocal cord poylps  1969    excision  . Tonsillectomy    . Colonoscopy    . Breast mammosite  03/14/2011    Procedure: MAMMOSITE BREAST;  Surgeon: Rolm Bookbinder, MD;  Location: [facility];  Service: General;  Laterality: Right;  . Breast biopsy  02/11/11    Right Breast Needle Core Biospy - Upper Outer Quadrant; ER/PR 100%, Her-2 Neu neg.; Ki-67 10%  . Breast surgery      right breast lumpectomy snbx  . Basal cell carcinoma excision    . Squamous cell carcinoma excision    . Colonoscopy w/ polypectomy    . Tear duct probing    . Dilation and curettage of uterus N/A 04/24/2013    Procedure: DILATATION AND CURETTAGE;  Surgeon: Guss Bunde, MD;  Location: [facility];  Service: Gynecology;  Laterality: N/A;  . Hysteroscopy N/A 04/24/2013    Procedure: HYSTEROSCOPY;  Surgeon: Guss Bunde, MD;  Location: [facility];  Service: Gynecology;  Laterality: N/A;  . Robotic assisted total hysterectomy with bilateral salpingo oopherectomy Bilateral 06/11/2013    Procedure: ROBOTIC ASSISTED TOTAL HYSTERECTOMY WITH BILATERAL SALPINGO OOPHORECTOMY WITH POSSIBLE STAGING, EPISIOTOMY;  Surgeon: Janie Morning, MD;  Location: [facility];  Service: Gynecology;  Laterality: Bilateral;    Past Medical Hx:  Past Medical History  Diagnosis Date  . GERD (gastroesophageal reflux disease)   . Hyperlipidemia   . Shingles 2005  . Hypertension   . History of cerebrovascular accident   . Hiatal hernia 2008  . Bradycardia   . Polyp of colon   . Cataract   . Macular degeneration   . Vocal cord polyp   . Stroke 09/2007    Slight memory problems residual  . Tendonitis     rt hand  . History of  breast cancer   . Cancer 10/28/2009    squamous cell - upper right arm  . Cancer 10/28/09    basal cell -upper right back  . Breast cancer 03/2011  . Endometrial cancer 03/2013    uterine ca  . Endometrial cancer     Oncology Hx:    Breast cancer, female   03/17/2011 Initial Diagnosis Breast cancer, female    Endometrial cancer   04/25/2013 Initial Diagnosis Endometrial cancer    Family Hx:  Family History  Problem Relation Age of Onset  . Cancer Sister 62    Lymphoma  . Cancer Paternal Aunt     Breast cancer  . Cancer Daughter 7    Bilateral Breast Cancer  . Cancer Daughter  Breast Cancer  . Cancer Sister     2005 - Renal Cell Cancer and Breast Cancer  . Heart disease Father   Review of Systems:  Genitourinary: No frequency, urgency, or dysuria.  Reports heavy brown vaginal discharge this week.   Psychology: Memory decline since surgery, reports significant worry.    Vitals:  Blood pressure 147/69, pulse 61, temperature 98.5 F (36.9 C), temperature source Oral, resp. rate 16, weight 192 lb 6.4 oz (87.272 kg).  Physical Exam: Well-nourished well-developed female in no acute distress. 6 item Cognitive impairment test:  Excellent. Pelvic: External genitalia atrophic. The vaginal cuff is intact, no discharge or masses.  Suture present.

## 2013-08-22 NOTE — Patient Instructions (Signed)
Follow-up in 3 months  Please don't worry.  Thank you very much Ms. Michelle [name] for allowing me to provide care for you today.  I appreciate your confidence in choosing our Gynecologic Oncology team.  If you have any questions about your visit today please call our office and we will get back to you as soon as possible.  Please consider using the website Medlineplus.gov as an Geneticist, molecular.   Francetta Found. Jaleyah Longhi MD., PhD Gynecologic Oncology

## 2013-08-22 NOTE — Telephone Encounter (Signed)
Pt called with concerns regarding dark brown bleeding Monday after standing for approximately 2 or more hours while going through her papers/throwing. Previously she had some mild spotting for approx. 10 days not enough to use a pad but then it had stopped. Pt taking Plavix and 325mg  Aspirin at night. Reviewed with Dr. Skeet Latch, instructed pt she will need to come in to be seen. Pt verbalized understanding will be here soon.

## 2013-09-25 ENCOUNTER — Telehealth: Payer: Self-pay

## 2013-09-25 NOTE — Telephone Encounter (Signed)
Pt lvm asking if Dr. Madilyn Fireman could change her Crestor to a generic.Michelle [name] [place]

## 2013-09-25 NOTE — Telephone Encounter (Signed)
Patient states the cost of Crestor is 175 dollars for a thirty day supply. She would like to switch to something cheaper. "As long as Dr Madilyn Fireman is ok with the change. " per patient.

## 2013-09-26 MED ORDER — ATORVASTATIN CALCIUM 40 MG PO TABS: 40.0000 mg | ORAL_TABLET | Freq: Every day | ORAL | Status: DC

## 2013-09-26 NOTE — Telephone Encounter (Signed)
No problems. Lets try atorvastatin 40mg . New rx sent.

## 2013-09-26 NOTE — Telephone Encounter (Signed)
Patient is going to take Lipitor 40 mg tablet, half a tablet every other day as she did with her Crestor.

## 2013-09-26 NOTE — Telephone Encounter (Signed)
LMOM

## 2013-09-27 ENCOUNTER — Encounter (INDEPENDENT_AMBULATORY_CARE_PROVIDER_SITE_OTHER): Payer: Self-pay | Admitting: General Surgery

## 2013-10-21 ENCOUNTER — Ambulatory Visit (INDEPENDENT_AMBULATORY_CARE_PROVIDER_SITE_OTHER): Payer: Medicare Other | Admitting: Family Medicine

## 2013-10-21 ENCOUNTER — Encounter: Payer: Self-pay | Admitting: Family Medicine

## 2013-10-21 VITALS — BP 142/83 | HR 57 | Temp 97.7°F | Wt 198.0 lb

## 2013-10-21 DIAGNOSIS — M7989 Other specified soft tissue disorders: Secondary | ICD-10-CM

## 2013-10-21 NOTE — Progress Notes (Signed)
Subjective:    Patient ID: Michelle [name], female    DOB: [birth year], 80 y.o.   MRN: [id number]  HPI Noticed bilat LE swelling x 6 mo.  Dulls feet feel numb on the top. More swelling on the right compared to the left.  Has been keeping feet elevated and helps some not not as much lately.  Hx of old frature in the left ankle years ago.   No SOB.  No CP. Feels like she gets weak in her chest and arms at night, but says not really painful.  Noticed it more since has been taking care of her great grandchild 2 days per week. No recent surgery.  Not been sedentary.  No HRT. She is on plavix.     Review of Systems  BP 142/83  Pulse 57  Temp(Src) 97.7 F (36.5 C)  Wt 198 lb (89.812 kg)    Allergies  Allergen Reactions  . Benadryl [Diphenhydramine Hcl]     Hyper  . Doxycycline Other (See Comments)    acid reflux  . Simvastatin Other (See Comments)    REACTION: memory loss  . Lisinopril Other (See Comments)    Dry cough    Past Medical History  Diagnosis Date  . GERD (gastroesophageal reflux disease)   . Hyperlipidemia   . Shingles 2005  . Hypertension   . History of cerebrovascular accident   . Hiatal hernia 2008  . Bradycardia   . Polyp of colon   . Cataract   . Macular degeneration   . Vocal cord polyp   . Stroke 09/2007    Slight memory problems residual  . Tendonitis     rt hand  . History of breast cancer   . Cancer 10/28/2009    squamous cell - upper right arm  . Cancer 10/28/09    basal cell -upper right back  . Breast cancer 03/2011  . Endometrial cancer 03/2013    uterine ca  . Endometrial cancer     Past Surgical History  Procedure Laterality Date  . Cholecystectomy  2004  . Biospy  10/28/09    Shave Biospy skin Left hand(Acitinic Keratoses), Right Upper Arm (superficial Basal Cell), Right Upper Back(Superficial Basal Cell), Left Neck ( Solar Lentigo & Seborrheic Keratoses)  . Vocal cord poylps  1969    excision  . Tonsillectomy    . Colonoscopy    .  Breast mammosite  03/14/2011    Procedure: MAMMOSITE BREAST;  Surgeon: Rolm Bookbinder, MD;  Location: [facility];  Service: General;  Laterality: Right;  . Breast biopsy  02/11/11    Right Breast Needle Core Biospy - Upper Outer Quadrant; ER/PR 100%, Her-2 Neu neg.; Ki-67 10%  . Breast surgery      right breast lumpectomy snbx  . Basal cell carcinoma excision    . Squamous cell carcinoma excision    . Colonoscopy w/ polypectomy    . Tear duct probing    . Dilation and curettage of uterus N/A 04/24/2013    Procedure: DILATATION AND CURETTAGE;  Surgeon: Guss Bunde, MD;  Location: [facility];  Service: Gynecology;  Laterality: N/A;  . Hysteroscopy N/A 04/24/2013    Procedure: HYSTEROSCOPY;  Surgeon: Guss Bunde, MD;  Location: [facility];  Service: Gynecology;  Laterality: N/A;  . Robotic assisted total hysterectomy with bilateral salpingo oopherectomy Bilateral 06/11/2013    Procedure: ROBOTIC ASSISTED TOTAL HYSTERECTOMY WITH BILATERAL SALPINGO OOPHORECTOMY WITH POSSIBLE STAGING, EPISIOTOMY;  Surgeon: Janie Morning, MD;  Location: [facility];  Service: Gynecology;  Laterality: Bilateral;    History   Social History  . Marital Status: Married    Spouse Name: N/A    Number of Children: N/A  . Years of Education: N/A   Occupational History  . retired    Social History Main Topics  . Smoking status: Former Smoker -- 1.00 packs/day for 30 years    Quit date: 02/01/1996  . Smokeless tobacco: Never Used  . Alcohol Use: 0.6 oz/week    1 Glasses of wine per week     Comment: Ocassionaly once a year .  Marland Kitchen Drug Use: No  . Sexual Activity: Yes    Partners: Male     Comment: Menarche age 44, menopause age 39, HRT x 1year   Other Topics Concern  . Not on file   Social History Narrative  . No narrative on file    Family History  Problem Relation Age of Onset  . Cancer Sister 15    Lymphoma  . Cancer Paternal Aunt     Breast cancer  . Cancer Daughter 22    Bilateral  Breast Cancer  . Cancer Daughter     Breast Cancer  . Cancer Sister     2005 - Renal Cell Cancer and Breast Cancer  . Heart disease Father     Outpatient Encounter Prescriptions as of 10/21/2013  Medication Sig  . aspirin 325 MG tablet Take 325 mg by mouth at bedtime.    Marland Kitchen atorvastatin (LIPITOR) 40 MG tablet Take 1 tablet (40 mg total) by mouth daily.  . clopidogrel (PLAVIX) 75 MG tablet TAKE 1 TABLET (75 MG TOTAL) BY MOUTH DAILY.  . fluticasone (FLONASE) 50 MCG/ACT nasal spray Place into both nostrils daily.  . lansoprazole (PREVACID) 15 MG capsule Take 30 mg by mouth daily at 12 noon.  Marland Kitchen losartan (COZAAR) 25 MG tablet Take 1 tablet (25 mg total) by mouth every morning.  . meloxicam (MOBIC) 7.5 MG tablet Take 1 tablet (7.5 mg total) by mouth daily as needed for pain.  . Multiple Vitamins-Minerals (PRESERVISION AREDS 2 PO) Take 1 capsule by mouth 2 (two) times daily.  . [DISCONTINUED] oxyCODONE-acetaminophen (PERCOCET/ROXICET) 5-325 MG per tablet Take 1-2 tablets by mouth every 4 (four) hours as needed for severe pain (moderate to severe pain).  . [DISCONTINUED] tiZANidine (ZANAFLEX) 4 MG tablet Take 0.5-1 tablets (2-4 mg total) by mouth every 8 (eight) hours as needed for muscle spasms.          Objective:   Physical Exam  Constitutional: She is oriented to person, place, and time. She appears well-developed and well-nourished.  HENT:  Head: Normocephalic and atraumatic.  Right Ear: External ear normal.  Left Ear: External ear normal.  Nose: Nose normal.  Mouth/Throat: Oropharynx is clear and moist.  TMs and canals are clear.   Eyes: Conjunctivae and EOM are normal. Pupils are equal, round, and reactive to light.  Neck: Neck supple. No thyromegaly present.  Cardiovascular: Normal rate, regular rhythm and normal heart sounds.   Pulmonary/Chest: Effort normal and breath sounds normal. She has no wheezes.  Musculoskeletal: She exhibits edema.  1+ pitting edema from knee down on  the right leg.  From ankle down on the left leg.  Unable to palpate dorsal pedal pulses. Unable to palpate posterior tibial pulses. With ultrasound I was able to pick up the posterior tibial pulses bilaterally but not the dorsal pedal pulses. Capillary refill is less than 3 seconds on both  feet on all toes.  Able to flex and extend the ankle with no sig pain.    Lymphadenopathy:    She has no cervical adenopathy.  Neurological: She is alert and oriented to person, place, and time.  Skin: Skin is warm and dry.  Psychiatric: She has a normal mood and affect.          Assessment & Plan:  LE edema - Worse on the right compared ot the left.  It has been on and off for 6 months but has been more persistent over the last 2 and half weeks. We will evaluate for thyroid problems, kidney function pounds. We'll check a proteinuria.  I think dvt is less likely as no current risk factors and is on Plavix.  Unable to pick up DP pulses so will schedule for ABIs.  Consider venous stassis as well.

## 2013-10-22 ENCOUNTER — Ambulatory Visit (HOSPITAL_COMMUNITY)
Admission: RE | Admit: 2013-10-22 | Discharge: 2013-10-22 | Disposition: A | Discharge status: Home / Self Care | Payer: Medicare Other | Source: Ambulatory Visit | Attending: Family Medicine | Admitting: Family Medicine

## 2013-10-22 ENCOUNTER — Other Ambulatory Visit: Payer: Self-pay | Admitting: Family Medicine

## 2013-10-22 ENCOUNTER — Other Ambulatory Visit: Payer: Self-pay | Admitting: *Deleted

## 2013-10-22 DIAGNOSIS — E669 Obesity, unspecified: Secondary | ICD-10-CM

## 2013-10-22 DIAGNOSIS — R791 Abnormal coagulation profile: Secondary | ICD-10-CM | POA: Insufficient documentation

## 2013-10-22 DIAGNOSIS — R7989 Other specified abnormal findings of blood chemistry: Secondary | ICD-10-CM

## 2013-10-22 DIAGNOSIS — M7989 Other specified soft tissue disorders: Secondary | ICD-10-CM | POA: Diagnosis not present

## 2013-10-22 DIAGNOSIS — C50919 Malignant neoplasm of unspecified site of unspecified female breast: Secondary | ICD-10-CM | POA: Diagnosis not present

## 2013-10-22 DIAGNOSIS — R600 Localized edema: Secondary | ICD-10-CM

## 2013-10-22 LAB — URINALYSIS, ROUTINE W REFLEX MICROSCOPIC
Bilirubin Urine: NEGATIVE
Glucose, UA: NEGATIVE mg/dL
HGB URINE DIPSTICK: NEGATIVE
Ketones, ur: NEGATIVE mg/dL
NITRITE: NEGATIVE
PH: 5.5 (ref 5.0–8.0)
Protein, ur: NEGATIVE mg/dL
SPECIFIC GRAVITY, URINE: 1.013 (ref 1.005–1.030)
Urobilinogen, UA: 0.2 mg/dL (ref 0.0–1.0)

## 2013-10-22 LAB — COMPLETE METABOLIC PANEL WITH GFR
ALK PHOS: 92 U/L (ref 39–117)
ALT: 12 U/L (ref 0–35)
AST: 19 U/L (ref 0–37)
Albumin: 4 g/dL (ref 3.5–5.2)
BILIRUBIN TOTAL: 0.3 mg/dL (ref 0.2–1.2)
BUN: 12 mg/dL (ref 6–23)
CALCIUM: 9.5 mg/dL (ref 8.4–10.5)
CO2: 30 mEq/L (ref 19–32)
CREATININE: 0.91 mg/dL (ref 0.50–1.10)
Chloride: 105 mEq/L (ref 96–112)
GFR, Est African American: 70 mL/min
GFR, Est Non African American: 61 mL/min
Glucose, Bld: 87 mg/dL (ref 70–99)
Potassium: 4.3 mEq/L (ref 3.5–5.3)
Sodium: 141 mEq/L (ref 135–145)
Total Protein: 6.9 g/dL (ref 6.0–8.3)

## 2013-10-22 LAB — URINALYSIS, MICROSCOPIC ONLY
Bacteria, UA: NONE SEEN
CASTS: NONE SEEN
Crystals: NONE SEEN
Squamous Epithelial / LPF: NONE SEEN

## 2013-10-22 LAB — MICROALBUMIN / CREATININE URINE RATIO
Creatinine, Urine: 63.7 mg/dL
MICROALB UR: 0.93 mg/dL (ref 0.00–1.89)
MICROALB/CREAT RATIO: 14.6 mg/g (ref 0.0–30.0)

## 2013-10-22 LAB — TSH: TSH: 2.609 u[IU]/mL (ref 0.350–4.500)

## 2013-10-22 LAB — BRAIN NATRIURETIC PEPTIDE: BRAIN NATRIURETIC PEPTIDE: 51.1 pg/mL (ref 0.0–100.0)

## 2013-10-22 LAB — D-DIMER, QUANTITATIVE: D-Dimer, Quant: 1.04 ug/mL-FEU — ABNORMAL HIGH (ref 0.00–0.48)

## 2013-10-22 NOTE — Progress Notes (Signed)
Right lower extremity venous duplex completed.  Right:  No evidence of DVT, superficial thrombosis, or Baker's cyst.  Left:  Negative for DVT in the common femoral vein.  

## 2013-10-23 ENCOUNTER — Other Ambulatory Visit (HOSPITAL_COMMUNITY): Payer: Self-pay | Admitting: Family Medicine

## 2013-10-23 ENCOUNTER — Other Ambulatory Visit: Payer: Self-pay | Admitting: *Deleted

## 2013-10-23 DIAGNOSIS — M7989 Other specified soft tissue disorders: Secondary | ICD-10-CM

## 2013-10-23 DIAGNOSIS — R7989 Other specified abnormal findings of blood chemistry: Secondary | ICD-10-CM

## 2013-10-23 MED ORDER — LOSARTAN POTASSIUM 25 MG PO TABS: 25.0000 mg | ORAL_TABLET | Freq: Every morning | ORAL | Status: DC

## 2013-10-23 MED ORDER — CLOPIDOGREL BISULFATE 75 MG PO TABS: ORAL_TABLET | ORAL | Status: DC

## 2013-10-24 ENCOUNTER — Telehealth: Payer: Self-pay | Admitting: *Deleted

## 2013-10-24 NOTE — Telephone Encounter (Signed)
Pt called and lvm asking about an appt to have ABI's done? Please advise.Michelle KitchenMarland KitchenAudelia Hives [place]

## 2013-10-25 ENCOUNTER — Other Ambulatory Visit: Payer: Self-pay | Admitting: Family Medicine

## 2013-10-25 DIAGNOSIS — R0989 Other specified symptoms and signs involving the circulatory and respiratory systems: Secondary | ICD-10-CM

## 2013-10-25 NOTE — Telephone Encounter (Signed)
Please have Anderson Malta check on it.

## 2013-10-31 ENCOUNTER — Ambulatory Visit (HOSPITAL_COMMUNITY)
Admission: RE | Admit: 2013-10-31 | Discharge: 2013-10-31 | Disposition: A | Discharge status: Home / Self Care | Payer: Medicare Other | Source: Ambulatory Visit | Attending: Family Medicine | Admitting: Family Medicine

## 2013-10-31 DIAGNOSIS — R0989 Other specified symptoms and signs involving the circulatory and respiratory systems: Secondary | ICD-10-CM

## 2013-10-31 DIAGNOSIS — M79609 Pain in unspecified limb: Secondary | ICD-10-CM

## 2013-10-31 DIAGNOSIS — I714 Abdominal aortic aneurysm, without rupture, unspecified: Secondary | ICD-10-CM

## 2013-10-31 DIAGNOSIS — M79661 Pain in right lower leg: Secondary | ICD-10-CM | POA: Diagnosis present

## 2013-10-31 HISTORY — DX: Abdominal aortic aneurysm, without rupture, unspecified: I71.40

## 2013-10-31 HISTORY — DX: Abdominal aortic aneurysm, without rupture: I71.4

## 2013-10-31 NOTE — Progress Notes (Signed)
VASCULAR LAB PRELIMINARY  ARTERIAL  ABI completed:    RIGHT    LEFT    PRESSURE WAVEFORM  PRESSURE WAVEFORM  BRACHIAL  Triphasic BRACHIAL 129 Triphasic  DP 148 Biphasic DP 145 Triphasic  PT 139 Triphasic PT 154 Triphasic    RIGHT LEFT  ABI 1.15 1.19   ABIs and Doppler waveforms are within normal limits bilaterally  Kery Haltiwanger, RVS 10/31/2013, 2:15 PM

## 2013-11-01 ENCOUNTER — Other Ambulatory Visit: Payer: Medicare Other

## 2013-11-01 DIAGNOSIS — C541 Malignant neoplasm of endometrium: Secondary | ICD-10-CM

## 2013-11-01 LAB — BUN AND CREATININE (CC13)
BUN: 12.3 mg/dL (ref 7.0–26.0)
Creatinine: 0.9 mg/dL (ref 0.6–1.1)

## 2013-11-04 ENCOUNTER — Other Ambulatory Visit: Payer: Self-pay | Admitting: Family Medicine

## 2013-11-04 MED ORDER — AMBULATORY NON FORMULARY MEDICATION: Status: DC

## 2013-11-04 NOTE — Telephone Encounter (Signed)
Routed to J. Todd for f/u.Audelia Hives [place]

## 2013-11-06 ENCOUNTER — Encounter (HOSPITAL_COMMUNITY): Payer: Self-pay

## 2013-11-06 ENCOUNTER — Ambulatory Visit (HOSPITAL_COMMUNITY)
Admission: RE | Admit: 2013-11-06 | Discharge: 2013-11-06 | Disposition: A | Discharge status: Home / Self Care | Payer: Medicare Other | Source: Ambulatory Visit | Attending: Gynecologic Oncology | Admitting: Gynecologic Oncology

## 2013-11-06 DIAGNOSIS — R590 Localized enlarged lymph nodes: Secondary | ICD-10-CM | POA: Diagnosis not present

## 2013-11-06 DIAGNOSIS — C541 Malignant neoplasm of endometrium: Secondary | ICD-10-CM | POA: Diagnosis present

## 2013-11-06 MED ORDER — IOHEXOL 300 MG/ML  SOLN
100.0000 mL | Freq: Once | INTRAMUSCULAR | Status: AC | PRN
  Administered 2013-11-06: 100 mL via INTRAVENOUS

## 2013-11-10 ENCOUNTER — Telehealth: Payer: Self-pay | Admitting: Gynecologic Oncology

## 2013-11-10 NOTE — Telephone Encounter (Signed)
GYN ONC OFFICE NOTE  Michelle [name] 80 y.o. female  CC:  Chief Complaint  Patient presents with  . Abstract   Assessment/Plan: 80 y.o. with a grade 1 endometrioid carcinoma. S/P Robotic TAH BSO 06/11/13.  Stage IA grade 1 endometrial cancer  Follow upwith Gyn Onc in 12 months   Follow up with Dr. Silas Sacramento in 06/2014 Annual pap with Dr.  [name]    HPI:  Patient is a 80 y.o.  gravida 2 para 2 has been menopausal for 25+ years. Late in November 2014 she began experiencing a dark vaginal discharge. In January of 2015 she began having some bleeding and spotting. Attempt was made at an endometrial biopsy in the office. However, secondary to her anatomy it could not be performed. She subsequently underwent a D&C on 04/25/2013. Findings are consistent with a grade 1 endometrioid adenocarcinoma. She did have a transvaginal ultrasound on March 5. It revealed the uterus to be 3 x 3.8 x 4.7 cm with no fibroids. There is a thickened endometrial stripe of 1 cm. The right adnexa could not be seen. The left adnexa measured 1.2 x 1.7 x 2.4 cm and was unremarkable.   She's overall doing fairly well. She states that she's had 2-3 episodes of spotting since her D&C. She denies any pain. She's had no change in her bowel or bladder habits. She did have a stroke in August of 2009. She does have some memory loss issues. She's not sure to [place] because of the stroke her age. She otherwise has no neurologic motor or sensory deficits. She is followed by Dr.Metheney. She is up-to-date on her mammograms. Should a colonoscopy in 2014. Her family history is remarkable that she has a personal history of breast cancer. Her sister also had breast cancer. She has 2 daughters currently aged 62 and 93. One daughter had breast cancer at age of 31. The other daughter had her first breast cancer at age of 70 and a recurrence of 24. They have undergone BRCA testing and they are BRCA negative. One daughter underwent Lynch testing  and similarly was negative for that as well.  She underwent [place]illo BSO BPLND 06/11/2013.  Path: 1. ONCOLOGY TABLE-UTERUS, CARCINOMA Specimen: Uterus, cervix and bilateral fallopian tubes and ovaries Procedure: Total hysterectomy and bilateral salpingo-oophorectomy Lymph node sampling performed: yes Specimen integrity: intact Maximum tumor size: 2.0 cm Histologic type: Invasive endometrioid carcinoma Grade: FIGO grade I Myometrial invasion: 0.5 cm where myometrium is 1.6 cm in thickness Cervical stromal involvement: No involved Extent of involvement of other organs: No Lymph - vascular invasion: Not identified Peritoneal washings: N/A Lymph nodes: # examined 13 ; # positive 0 Pelvic lymph nodes: 0 involved of 13 lymph nodes.  At the post op visit 07/25/2013. Patient reported significant RLQ discomfort, malaise and pain when riding in an SUV.  CT abd/pelvis collected and was notable for diverticulosis and a single enlarged pelvic LN.    Repeat CT 10/2013 Vascular/Lymphatic: Previously seen low-attenuation lymphadenopathy in the right external iliac chain has decreased since previous  study, now measuring 8 mm on image 69 compared with 1.7 cm in short axis on prior study. No other pathologically enlarged lymph nodes  identified. A 3.0 cm infrarenal abdominal aortic aneurysm is noted in shows no significant change since recent exam.    Social Hx:   History   Social History  . Marital Status: Married    Spouse Name: N/A    Number of Children: N/A  . Years  of Education: N/A   Occupational History  . retired    Social History Main Topics  . Smoking status: Former Smoker -- 1.00 packs/day for 30 years    Quit date: 02/01/1996  . Smokeless tobacco: Never Used  . Alcohol Use: 0.6 oz/week    1 Glasses of wine per week     Comment: Ocassionaly once a year .  Marland Kitchen Drug Use: No  . Sexual Activity: Yes    Partners: Male     Comment: Menarche age 57, menopause age 40, HRT x 1year   Other  Topics Concern  . Not on file   Social History Narrative  . No narrative on file    Past Surgical Hx:  Past Surgical History  Procedure Laterality Date  . Cholecystectomy  2004  . Biospy  10/28/09    Shave Biospy skin Left hand(Acitinic Keratoses), Right Upper Arm (superficial Basal Cell), Right Upper Back(Superficial Basal Cell), Left Neck ( Solar Lentigo & Seborrheic Keratoses)  . Vocal cord poylps  1969    excision  . Tonsillectomy    . Colonoscopy    . Breast mammosite  03/14/2011    Procedure: MAMMOSITE BREAST;  Surgeon: Rolm Bookbinder, MD;  Location: [facility];  Service: General;  Laterality: Right;  . Breast biopsy  02/11/11    Right Breast Needle Core Biospy - Upper Outer Quadrant; ER/PR 100%, Her-2 Neu neg.; Ki-67 10%  . Breast surgery      right breast lumpectomy snbx  . Basal cell carcinoma excision    . Squamous cell carcinoma excision    . Colonoscopy w/ polypectomy    . Tear duct probing    . Dilation and curettage of uterus N/A 04/24/2013    Procedure: DILATATION AND CURETTAGE;  Surgeon: Guss Bunde, MD;  Location: [facility];  Service: Gynecology;  Laterality: N/A;  . Hysteroscopy N/A 04/24/2013    Procedure: HYSTEROSCOPY;  Surgeon: Guss Bunde, MD;  Location: [facility];  Service: Gynecology;  Laterality: N/A;  . Robotic assisted total hysterectomy with bilateral salpingo oopherectomy Bilateral 06/11/2013    Procedure: ROBOTIC ASSISTED TOTAL HYSTERECTOMY WITH BILATERAL SALPINGO OOPHORECTOMY WITH POSSIBLE STAGING, EPISIOTOMY;  Surgeon: Janie Morning, MD;  Location: [facility];  Service: Gynecology;  Laterality: Bilateral;    Past Medical Hx:  Past Medical History  Diagnosis Date  . GERD (gastroesophageal reflux disease)   . Hyperlipidemia   . Shingles 2005  . Hypertension   . History of cerebrovascular accident   . Hiatal hernia 2008  . Bradycardia   . Polyp of colon   . Cataract   . Macular degeneration   . Vocal cord polyp   . Stroke 09/2007     Slight memory problems residual  . Tendonitis     rt hand  . History of breast cancer   . Cancer 10/28/2009    squamous cell - upper right arm  . Cancer 10/28/09    basal cell -upper right back  . Breast cancer 03/2011  . Endometrial cancer 03/2013    uterine ca  . Endometrial cancer     Oncology Hx:    Breast cancer, female   03/17/2011 Initial Diagnosis Breast cancer, female    Endometrial cancer   04/25/2013 Initial Diagnosis Endometrial cancer    Family Hx:  Family History  Problem Relation Age of Onset  . Cancer Sister 62    Lymphoma  . Cancer Paternal Aunt     Breast cancer  . Cancer Daughter 93  Bilateral Breast Cancer  . Cancer Daughter     Breast Cancer  . Cancer Sister     2005 - Renal Cell Cancer and Breast Cancer  . Heart disease Father   Review of Systems:  Genitourinary: No frequency, urgency, or dysuria.  Reports heavy brown vaginal discharge this week.   Psychology: Memory decline since surgery, reports significant worry.    Vitals:  Blood pressure 147/69, pulse 61, temperature 98.5 F (36.9 C), temperature source Oral, resp. rate 16, weight 192 lb 6.4 oz (87.272 kg).  Physical Exam: Well-nourished well-developed female in no acute distress. 6 item Cognitive impairment test:  Excellent. Pelvic: External genitalia atrophic. The vaginal cuff is intact, no discharge or masses.  Suture present.

## 2013-11-11 ENCOUNTER — Ambulatory Visit: Payer: Medicare Other | Attending: Gynecologic Oncology | Admitting: Gynecologic Oncology

## 2013-11-11 ENCOUNTER — Encounter: Payer: Self-pay | Admitting: Gynecologic Oncology

## 2013-11-11 VITALS — BP 148/76 | HR 57 | Temp 97.9°F | Resp 22 | Ht 67.0 in | Wt 196.3 lb

## 2013-11-11 DIAGNOSIS — K219 Gastro-esophageal reflux disease without esophagitis: Secondary | ICD-10-CM | POA: Insufficient documentation

## 2013-11-11 DIAGNOSIS — I10 Essential (primary) hypertension: Secondary | ICD-10-CM | POA: Insufficient documentation

## 2013-11-11 DIAGNOSIS — C541 Malignant neoplasm of endometrium: Secondary | ICD-10-CM | POA: Diagnosis not present

## 2013-11-11 DIAGNOSIS — Z8673 Personal history of transient ischemic attack (TIA), and cerebral infarction without residual deficits: Secondary | ICD-10-CM | POA: Diagnosis not present

## 2013-11-11 DIAGNOSIS — I890 Lymphedema, not elsewhere classified: Secondary | ICD-10-CM | POA: Diagnosis not present

## 2013-11-11 DIAGNOSIS — Z87891 Personal history of nicotine dependence: Secondary | ICD-10-CM | POA: Diagnosis not present

## 2013-11-11 DIAGNOSIS — Z90710 Acquired absence of both cervix and uterus: Secondary | ICD-10-CM | POA: Insufficient documentation

## 2013-11-11 DIAGNOSIS — Z90722 Acquired absence of ovaries, bilateral: Secondary | ICD-10-CM | POA: Insufficient documentation

## 2013-11-11 DIAGNOSIS — Z85828 Personal history of other malignant neoplasm of skin: Secondary | ICD-10-CM | POA: Diagnosis not present

## 2013-11-11 DIAGNOSIS — Z807 Family history of other malignant neoplasms of lymphoid, hematopoietic and related tissues: Secondary | ICD-10-CM | POA: Diagnosis not present

## 2013-11-11 DIAGNOSIS — I714 Abdominal aortic aneurysm, without rupture: Secondary | ICD-10-CM | POA: Diagnosis not present

## 2013-11-11 DIAGNOSIS — Z853 Personal history of malignant neoplasm of breast: Secondary | ICD-10-CM | POA: Diagnosis not present

## 2013-11-11 DIAGNOSIS — E785 Hyperlipidemia, unspecified: Secondary | ICD-10-CM | POA: Diagnosis not present

## 2013-11-11 DIAGNOSIS — Z803 Family history of malignant neoplasm of breast: Secondary | ICD-10-CM | POA: Diagnosis not present

## 2013-11-11 DIAGNOSIS — Z8051 Family history of malignant neoplasm of kidney: Secondary | ICD-10-CM | POA: Insufficient documentation

## 2013-11-11 NOTE — Addendum Note (Signed)
Addended by: Lucile Crater on: 11/11/2013 11:29 AM   Modules accepted: Orders

## 2013-11-11 NOTE — Patient Instructions (Addendum)
Follow upwith Gyn Onc in 12 months  Follow up with Dr. Silas Sacramento in 06/2014 Annual pap with Dr.  [name]    Abdominal Aortic Aneurysm  Blood pumps away from the heart through tubes (blood vessels) called arteries. Aneurysms are weak or damaged places in the wall of an artery. It bulges out like a balloon. An abdominal aortic aneurysm happens in the main artery of the body (aorta). It can burst or tear, causing bleeding inside the body. This is an emergency. It needs treatment right away. CAUSES  The exact cause is unknown. Things that could cause this problem include:  Fat and other substances building up in the lining of a tube.  Swelling of the walls of a blood vessel.  Certain tissue diseases.  Belly (abdominal) trauma.  An infection in the main artery of the body. RISK FACTORS There are things that make it more likely for you to have an aneurysm. These include:  Being over the age of 78 years old.  Having high blood pressure (hypertension).  Being a female.  Being white.  Being very overweight (obese).  Having a family history of aneurysm.  Using tobacco products. PREVENTION To lessen your chance of getting this condition:  Stop smoking. Stop chewing tobacco.  Limit or avoid alcohol.  Keep your blood pressure, blood sugar, and cholesterol within normal limits.  Eat less salt.  Eat foods low in saturated fats and cholesterol. These are found in animal and whole dairy products.  Eat more fiber. Fiber is found in whole grains, vegetables, and fruits.  Keep a healthy weight.  Stay active and exercise often. SYMPTOMS Symptoms depend on the size of the aneurysm and how fast it grows. There may not be symptoms. If symptoms occur, they can include:  Pain (belly, side, lower back, or groin).  Feeling full after eating a small amount of food.  Feeling sick to your stomach (nauseous), throwing up (vomiting), or both.  Feeling a lump in your belly that  feels like it is beating (pulsating).  Feeling like you will pass out (faint). TREATMENT   Medicine to control blood pressure and pain.  Imaging tests to see if the aneurysm gets bigger.  Surgery. MAKE SURE YOU:   Understand these instructions.  Will watch your condition.  Will get help right away if you are not doing well or get worse. Document Released: 05/14/2012 Document Reviewed: 05/14/2012 [facility] Patient Information 2015 [place]. This information is not intended to replace advice given to you by your health care provider. Make sure you discuss any questions you have with your health care provider.

## 2013-11-11 NOTE — Progress Notes (Signed)
GYN ONC OFFICE NOTE  Michelle [name] 80 y.o. female  CC:  Chief Complaint  Patient presents with  . Endometrial Adenocarcinoma   Assessment/Plan: 80 y.o. with a grade 1 endometrioid carcinoma. S/P Robotic TAH BSO 06/11/13.  Stage IA grade 1 endometrial cancer  Follow upwith Gyn Onc in 12 months Physical therapy referral Follow up with Dr. Silas [place] in 06/2014 Annual pap with Dr.  [name]   Lymphedema: Advised to use compression stockings If worsens will refer to lymphedema clinic  Abdominal aneurysm Stable since last imaging Recommendations regarding management deferred to Dr.Metheny   HPI:  Patient is a 80 y.o.  gravida 2 para 2 has been menopausal for 25+ years. Late in November 2014 she began experiencing a dark vaginal discharge. In January of 2015 she began having some bleeding and spotting. Attempt was made at an endometrial biopsy in the office. However, secondary to her anatomy it could not be performed. She subsequently underwent a D&C on 04/25/2013. Findings are consistent with a grade 1 endometrioid adenocarcinoma. She did have a transvaginal ultrasound on March 5. It revealed the uterus to be 3 x 3.8 x 4.7 cm with no fibroids. There is a thickened endometrial stripe of 1 cm. The right adnexa could not be seen. The left adnexa measured 1.2 x 1.7 x 2.4 cm and was unremarkable.   She's overall doing fairly well. She states that she's had 2-3 episodes of spotting since her D&C. She denies any pain. She's had no change in her bowel or bladder habits. She did have a stroke in August of 2009. She does have some memory loss issues. She's not sure to [place] because of the stroke her age. She otherwise has no neurologic motor or sensory deficits. She is followed by Dr.Metheney. She is up-to-date on her mammograms. Should a colonoscopy in 2014. Her family history is remarkable that she has a personal history of breast cancer. Her sister also had breast cancer. She has 2 daughters  currently aged 67 and 89. One daughter had breast cancer at age of 39. The other daughter had her first breast cancer at age of 52 and a recurrence of 29. They have undergone BRCA testing and they are BRCA negative. One daughter underwent Lynch testing and similarly was negative for that as well.  She underwent [place] BSO B[facility]ND 06/11/2013.  Myometrial invasion: 0.5 cm where myometrium is 1.6 cm in thickness Lymph - vascular invasion: Not identified Stage 1A grade 1  At the post op visit 07/25/2013. Patient reported significant RLQ discomfort, malaise and pain when riding in an SUV.  CT abd/pelvis collected and was notable for diverticulosis and a single enlarged pelvic LN.    Repeat CT 10/2013 Vascular/Lymphatic: Previously seen low-attenuation lymphadenopathy in the right external iliac chain has decreased since previous  study, now measuring 8 mm on image 69 compared with 1.7 cm in short axis on prior study. No other pathologically enlarged lymph nodes  identified. A 3.0 cm infrarenal abdominal aortic aneurysm is noted in shows no significant change since recent exam.  10/2013 Venous doppler studies negative  Social Hx:   History   Social History  . Marital Status: Married    Spouse Name: N/A    Number of Children: N/A  . Years of Education: N/A   Occupational History  . retired    Social History Main Topics  . Smoking status: Former Smoker -- 1.00 packs/day for 30 years    Quit date: 02/01/1996  . Smokeless  tobacco: Never Used  . Alcohol Use: 0.6 oz/week    1 Glasses of wine per week     Comment: Ocassionaly once a year .  Marland Kitchen Drug Use: No  . Sexual Activity: Yes    Partners: Male     Comment: Menarche age 70, menopause age 59, HRT x 1year   Other Topics Concern  . Not on file   Social History Narrative  . No narrative on file    Past Surgical Hx:  Past Surgical History  Procedure Laterality Date  . Cholecystectomy  2004  . Biospy  10/28/09    Shave Biospy skin Left  hand(Acitinic Keratoses), Right Upper Arm (superficial Basal Cell), Right Upper Back(Superficial Basal Cell), Left Neck ( Solar Lentigo & Seborrheic Keratoses)  . Vocal cord poylps  1969    excision  . Tonsillectomy    . Colonoscopy    . Breast mammosite  03/14/2011    Procedure: MAMMOSITE BREAST;  Surgeon: Rolm Bookbinder, MD;  Location: [facility];  Service: General;  Laterality: Right;  . Breast biopsy  02/11/11    Right Breast Needle Core Biospy - Upper Outer Quadrant; ER/PR 100%, Her-2 Neu neg.; Ki-67 10%  . Breast surgery      right breast lumpectomy snbx  . Basal cell carcinoma excision    . Squamous cell carcinoma excision    . Colonoscopy w/ polypectomy    . Tear duct probing    . Dilation and curettage of uterus N/A 04/24/2013    Procedure: DILATATION AND CURETTAGE;  Surgeon: Guss Bunde, MD;  Location: [facility];  Service: Gynecology;  Laterality: N/A;  . Hysteroscopy N/A 04/24/2013    Procedure: HYSTEROSCOPY;  Surgeon: Guss Bunde, MD;  Location: [facility];  Service: Gynecology;  Laterality: N/A;  . Robotic assisted total hysterectomy with bilateral salpingo oopherectomy Bilateral 06/11/2013    Procedure: ROBOTIC ASSISTED TOTAL HYSTERECTOMY WITH BILATERAL SALPINGO OOPHORECTOMY WITH POSSIBLE STAGING, EPISIOTOMY;  Surgeon: Janie Morning, MD;  Location: [facility];  Service: Gynecology;  Laterality: Bilateral;    Past Medical Hx:  Past Medical History  Diagnosis Date  . GERD (gastroesophageal reflux disease)   . Hyperlipidemia   . Shingles 2005  . Hypertension   . History of cerebrovascular accident   . Hiatal hernia 2008  . Bradycardia   . Polyp of colon   . Cataract   . Macular degeneration   . Vocal cord polyp   . Stroke 09/2007    Slight memory problems residual  . Tendonitis     rt hand  . History of breast cancer   . Cancer 10/28/2009    squamous cell - upper right arm  . Cancer 10/28/09    basal cell -upper right back  . Breast cancer 03/2011  .  Endometrial cancer 03/2013    uterine ca  . Endometrial cancer     Oncology Hx:    Breast cancer, female   03/17/2011 Initial Diagnosis Breast cancer, female    Endometrial cancer   04/25/2013 Initial Diagnosis Endometrial cancer    Family Hx:  Family History  Problem Relation Age of Onset  . Cancer Sister 38    Lymphoma  . Cancer Paternal Aunt     Breast cancer  . Cancer Daughter 41    Bilateral Breast Cancer  . Cancer Daughter     Breast Cancer  . Cancer Sister     2005 - Renal Cell Cancer and Breast Cancer  . Heart disease Father  Review of Systems:  Genitourinary: No frequency, urgency, or dysuria.   Psychology: Memory decline since surgery, reports significant worry. MS:  States that she has to push on a surface in order to stand. Neuro:  Generalized weakness of the legs, numbness of the inner thigh GI:  No n/v no abdominal pain, normal BM    Vitals:  Blood pressure 148/76, pulse 57, temperature 97.9 F (36.6 C), temperature source Oral, resp. rate 22, height 5' 7"  (1.702 m), weight 196 lb 4.8 oz (89.041 kg).  Physical Exam: Well-nourished well-developed female in no acute distress. LN  No cervical supra clavicular or inguinal adenopathy Chest:  CTA Abd:  Soft NT, incision sites intact without hernia Back No CVAT Pelvic: External genitalia atrophic. The vaginal cuff is intact, no discharge or masses.  Suture present. Rectal:  Good tone no masses EXT 2-3+ edema B R>L

## 2013-11-19 ENCOUNTER — Encounter: Payer: Self-pay | Admitting: Family Medicine

## 2013-11-19 ENCOUNTER — Other Ambulatory Visit: Payer: Self-pay | Admitting: Family Medicine

## 2013-11-19 ENCOUNTER — Ambulatory Visit (INDEPENDENT_AMBULATORY_CARE_PROVIDER_SITE_OTHER): Payer: Medicare Other | Admitting: Family Medicine

## 2013-11-19 ENCOUNTER — Ambulatory Visit (INDEPENDENT_AMBULATORY_CARE_PROVIDER_SITE_OTHER): Payer: Medicare Other

## 2013-11-19 VITALS — BP 121/68 | HR 64 | Wt 196.0 lb

## 2013-11-19 DIAGNOSIS — I714 Abdominal aortic aneurysm, without rupture: Secondary | ICD-10-CM | POA: Insufficient documentation

## 2013-11-19 DIAGNOSIS — M25552 Pain in left hip: Secondary | ICD-10-CM

## 2013-11-19 DIAGNOSIS — I10 Essential (primary) hypertension: Secondary | ICD-10-CM

## 2013-11-19 DIAGNOSIS — I7143 Infrarenal abdominal aortic aneurysm, without rupture: Secondary | ICD-10-CM

## 2013-11-19 DIAGNOSIS — M79675 Pain in left toe(s): Secondary | ICD-10-CM

## 2013-11-19 DIAGNOSIS — R413 Other amnesia: Secondary | ICD-10-CM

## 2013-11-19 DIAGNOSIS — L84 Corns and callosities: Secondary | ICD-10-CM

## 2013-11-19 DIAGNOSIS — Z23 Encounter for immunization: Secondary | ICD-10-CM

## 2013-11-19 DIAGNOSIS — E785 Hyperlipidemia, unspecified: Secondary | ICD-10-CM

## 2013-11-19 HISTORY — DX: Infrarenal abdominal aortic aneurysm, without rupture: I71.43

## 2013-11-19 MED ORDER — GABAPENTIN 100 MG PO CAPS: ORAL_CAPSULE | ORAL | Status: DC

## 2013-11-19 NOTE — Progress Notes (Signed)
   Subjective:    Patient ID: Michelle [name], female    DOB: 10/19/1932, 80 y.o.   MRN: [id number]  Hypertension   Followup lower extremity swelling-we did do ABIs which were normal. Have recommended custom grade compression stockings.  Saw her oncologist whoe felt likely from lymphedema since has multple LN removed in the pelvis.   Hypertension- Pt denies chest pain, SOB, dizziness, or heart palpitations.  Taking meds as directed w/o problems.  Denies medication side effects.    Hyperlipidemia-tolerating statin well without any side effects or problems. She's currently taking atorvastatin 40 mg. Last lipid level was a year ago.  Grabbing pain has started again in her left groin. Started immediately after her pelvic surgery.  Seemed to be getting better but now occuring again. Seen for this 4 months ago. Treated initially with muscle relaxerand NSAIDs and stretches.   Also hd SI joint pain at that time.    Left toe has a red and irritated spot that is tender at night.  Denies nay friction of her shoe, etc.  It has been a little better the last few days.  Review of Systems     Objective:   Physical Exam  Constitutional: She is oriented to person, place, and time. She appears well-developed and well-nourished.  HENT:  Head: Normocephalic and atraumatic.  Cardiovascular: Normal rate, regular rhythm and normal heart sounds.   Pulmonary/Chest: Effort normal and breath sounds normal.  Musculoskeletal:  Left hip is painfulwith external rotation  Neurological: She is alert and oriented to person, place, and time.  Skin: Skin is warm and dry.  5th toe on the left, laterally has a thick callous.  Some mild erythema but no induration, streaking, open wound or drainage.  This was debrided with a blade. Pt tolerated well. Small amount of bleeding.  Wound covered with gauze and coban dressing.    Psychiatric: She has a normal mood and affect. Her behavior is normal.          Assessment & Plan:   Left hip pain - Will get xrays today to eval of sig OA vs femoral cutaneous nerve pain.    Left toe pain - callous formation. Treatment performed today. Recommend topical antibiotic ointment for 3 days. Call if not better in one week. Any signs of infection.   HTN - well controlled. F/U in 6 months.    Aneurysm infrarenal - dictated to  have an infrarenal aneurysm on CT that she had done recently. She did have a CT scan back in 2006 which noted a dilatation at that time. It was approximately 2.6 cm wide. Now present meters wide. I will look up current protocols but I think it's to repeat the ultrasound in 5 years but I will verify this and call the patient back. Discussed the importance of risk reduction including controlling blood pressure, diet, weight loss, avoiding smoking etc. appear  Hyperlipidemia - tolerating that okay. She has noticed a little bit more difficulty with concentration. She wants to continue her current regimen for now.  She wants to exercise to help strengthen her LE.  Exercises for hamstrings and quads given.

## 2013-11-19 NOTE — Assessment & Plan Note (Signed)
Controlled. Continue current regimen. Followup in 6 months. Due for CMP to

## 2013-11-19 NOTE — Assessment & Plan Note (Signed)
Continue current regimen. Due to repeat lipid panel.

## 2013-11-20 LAB — VITAMIN D 25 HYDROXY (VIT D DEFICIENCY, FRACTURES): VIT D 25 HYDROXY: 26 ng/mL — AB (ref 30–89)

## 2013-11-21 ENCOUNTER — Ambulatory Visit: Payer: Medicare Other | Attending: Gynecologic Oncology | Admitting: Physical Therapy

## 2013-11-21 DIAGNOSIS — C541 Malignant neoplasm of endometrium: Secondary | ICD-10-CM | POA: Diagnosis not present

## 2013-11-21 DIAGNOSIS — I890 Lymphedema, not elsewhere classified: Secondary | ICD-10-CM | POA: Insufficient documentation

## 2013-12-02 ENCOUNTER — Encounter: Payer: Self-pay | Admitting: Family Medicine

## 2014-02-03 ENCOUNTER — Other Ambulatory Visit: Payer: Self-pay | Admitting: Family Medicine

## 2014-02-18 ENCOUNTER — Ambulatory Visit (INDEPENDENT_AMBULATORY_CARE_PROVIDER_SITE_OTHER): Payer: Medicare Other | Admitting: Family Medicine

## 2014-02-18 ENCOUNTER — Encounter: Payer: Self-pay | Admitting: Family Medicine

## 2014-02-18 VITALS — BP 133/77 | HR 61 | Ht 67.0 in | Wt 200.0 lb

## 2014-02-18 DIAGNOSIS — I714 Abdominal aortic aneurysm, without rupture: Secondary | ICD-10-CM | POA: Diagnosis not present

## 2014-02-18 DIAGNOSIS — I10 Essential (primary) hypertension: Secondary | ICD-10-CM | POA: Diagnosis not present

## 2014-02-18 DIAGNOSIS — Z8673 Personal history of transient ischemic attack (TIA), and cerebral infarction without residual deficits: Secondary | ICD-10-CM

## 2014-02-18 DIAGNOSIS — I890 Lymphedema, not elsewhere classified: Secondary | ICD-10-CM

## 2014-02-18 DIAGNOSIS — E785 Hyperlipidemia, unspecified: Secondary | ICD-10-CM

## 2014-02-18 DIAGNOSIS — Z23 Encounter for immunization: Secondary | ICD-10-CM | POA: Diagnosis not present

## 2014-02-18 DIAGNOSIS — I7143 Infrarenal abdominal aortic aneurysm, without rupture: Secondary | ICD-10-CM

## 2014-02-18 DIAGNOSIS — R0602 Shortness of breath: Secondary | ICD-10-CM | POA: Diagnosis not present

## 2014-02-18 DIAGNOSIS — G5710 Meralgia paresthetica, unspecified lower limb: Secondary | ICD-10-CM

## 2014-02-18 NOTE — Progress Notes (Signed)
Subjective:    Patient ID: Michelle [name], female    DOB: May 19, 1932, 81 y.o.   MRN: [id number]  HPI Hypertension- Pt denies chest pain, SOB, dizziness, or heart palpitations.  Taking meds as directed w/o problems.  Denies medication side effects.  She is not one her compression stockings for about 3 days because she wanted me to see the swelling in her right ankle and foot.  Was having right hip pain in Oct Had normal xray . I thought is was or consistant with femoral cutaneous nerve injury and put hre on neurontin. She has been taking it at night. She is not taking the medication for the last week or two since she was taking cold medication. She didn't want to mix the two. Says her pain has been well controlled off the medication. Still has some numbness over both groin crases      Hyperlipidemia-she says when she restarted the atorvastatin she felt a little off mentally. She felt she was a little bit more forgetful. But she feels like that has gotten better. She was worried that it would not work as well as the Crestor and wanted to make sure this was the right medication for her. Her last lipid panel was a little over a year ago.  She also wanted me to check her lungs today. She had 2 days last week where she was cleaning her house and felt a little bit more short of breath than usual. She denies any chest pain or palpitations or headache or dizziness at that time.  She has felt well since then. She thinks she may have just overdone it. Though did say has some clear phelgm that occ comes up from her upper chest. Had a bad cold a few weeks ago.  Review of Systems     Objective:   Physical Exam  Constitutional: She is oriented to person, place, and time. She appears well-developed and well-nourished.  HENT:  Head: Normocephalic and atraumatic.  Cardiovascular: Normal rate, regular rhythm and normal heart sounds.   Pulmonary/Chest: Effort normal and breath sounds normal.  Musculoskeletal:   1+ edema of the right lower leg, ankle and foot.   Neurological: She is alert and oriented to person, place, and time.  Skin: Skin is warm and dry.  Psychiatric: She has a normal mood and affect. Her behavior is normal.          Assessment & Plan:  HTN - well controlled. Continue current regimen. Follow-up in 4 months. Infrarenal aortic aneurysm - repeat US in 3 years.   Lymphedema-gave her reassurance about the swelling in her foot. This will continue be chronic for the rest of her life. It's important that she do her best to control this with compression stockings. She probably also has some component of lymphedema adding to the swelling. She is going to work on doing some massage techniques to help with this as well. She has a video that her daughter is going to get for her.  Femoral cutaneous neuropathy-much improved after a course of Neurontin over the last couple of months. Since she has done well over the last week or so without it then we will discontinue medication for now. We can always restart it if needed.  Discussed need for Prevnar 13 today.    Hyperlipidemia-gave her reassurance about the reduction and morbidity mortality seen with the drug Lipitor. I think so far she's tolerating it. And with her history of stroke and aneurysm it's extremely  important for her to continue this type of medication. Given lab slip today to repeat lipid panel.  Shortness of breath-seems to be brief and has resolved. She's not had any other symptoms such as chest pain. Lungs sound completely clear today. She did have a cold a couple of weeks ago. She may still be recovering from that. Consider CXR if sxs recur.

## 2014-02-18 NOTE — Patient Instructions (Addendum)
Will be due for repeat US of the aorta for a dilatation in October of 2018

## 2014-02-20 NOTE — Addendum Note (Signed)
Addended by: Teddy Spike on: 02/20/2014 07:51 AM   Modules accepted: Orders

## 2014-05-08 ENCOUNTER — Other Ambulatory Visit: Payer: Self-pay | Admitting: Dermatology

## 2014-05-08 DIAGNOSIS — D485 Neoplasm of uncertain behavior of skin: Secondary | ICD-10-CM | POA: Diagnosis not present

## 2014-05-08 DIAGNOSIS — D2239 Melanocytic nevi of other parts of face: Secondary | ICD-10-CM | POA: Diagnosis not present

## 2014-05-08 DIAGNOSIS — Z85828 Personal history of other malignant neoplasm of skin: Secondary | ICD-10-CM | POA: Diagnosis not present

## 2014-05-08 DIAGNOSIS — L814 Other melanin hyperpigmentation: Secondary | ICD-10-CM | POA: Diagnosis not present

## 2014-05-08 DIAGNOSIS — D045 Carcinoma in situ of skin of trunk: Secondary | ICD-10-CM | POA: Diagnosis not present

## 2014-05-08 DIAGNOSIS — L570 Actinic keratosis: Secondary | ICD-10-CM | POA: Diagnosis not present

## 2014-05-08 DIAGNOSIS — D0462 Carcinoma in situ of skin of left upper limb, including shoulder: Secondary | ICD-10-CM | POA: Diagnosis not present

## 2014-05-08 DIAGNOSIS — L821 Other seborrheic keratosis: Secondary | ICD-10-CM | POA: Diagnosis not present

## 2014-06-15 ENCOUNTER — Encounter: Payer: Self-pay | Admitting: Family Medicine

## 2014-06-17 DIAGNOSIS — I10 Essential (primary) hypertension: Secondary | ICD-10-CM | POA: Diagnosis not present

## 2014-06-17 DIAGNOSIS — Z8673 Personal history of transient ischemic attack (TIA), and cerebral infarction without residual deficits: Secondary | ICD-10-CM | POA: Diagnosis not present

## 2014-06-18 LAB — BASIC METABOLIC PANEL
BUN: 13 mg/dL (ref 6–23)
CALCIUM: 9.1 mg/dL (ref 8.4–10.5)
CO2: 24 mEq/L (ref 19–32)
CREATININE: 0.93 mg/dL (ref 0.50–1.10)
Chloride: 106 mEq/L (ref 96–112)
Glucose, Bld: 89 mg/dL (ref 70–99)
Potassium: 4.4 mEq/L (ref 3.5–5.3)
Sodium: 143 mEq/L (ref 135–145)

## 2014-06-18 LAB — LIPID PANEL
Cholesterol: 146 mg/dL (ref 0–200)
HDL: 56 mg/dL (ref >=46)
LDL CALC: 70 mg/dL (ref 0–99)
TRIGLYCERIDES: 99 mg/dL (ref <150)
Total CHOL/HDL Ratio: 2.6 Ratio
VLDL: 20 mg/dL (ref 0–40)

## 2014-06-18 NOTE — Progress Notes (Signed)
Quick Note:  All labs are normal. ______ 

## 2014-06-19 ENCOUNTER — Ambulatory Visit (INDEPENDENT_AMBULATORY_CARE_PROVIDER_SITE_OTHER): Payer: Medicare Other | Admitting: Family Medicine

## 2014-06-19 ENCOUNTER — Encounter: Payer: Self-pay | Admitting: Family Medicine

## 2014-06-19 VITALS — BP 139/77 | HR 60 | Ht 67.0 in | Wt 198.0 lb

## 2014-06-19 DIAGNOSIS — I10 Essential (primary) hypertension: Secondary | ICD-10-CM

## 2014-06-19 DIAGNOSIS — E559 Vitamin D deficiency, unspecified: Secondary | ICD-10-CM | POA: Diagnosis not present

## 2014-06-19 DIAGNOSIS — Z8673 Personal history of transient ischemic attack (TIA), and cerebral infarction without residual deficits: Secondary | ICD-10-CM | POA: Diagnosis not present

## 2014-06-19 DIAGNOSIS — R6889 Other general symptoms and signs: Secondary | ICD-10-CM

## 2014-06-19 DIAGNOSIS — E785 Hyperlipidemia, unspecified: Secondary | ICD-10-CM

## 2014-06-19 NOTE — Progress Notes (Signed)
   Subjective:    Patient ID: Michelle [name], female    DOB: 1932-05-20, 81 y.o.   MRN: [id number]  HPI Hypertension- Pt denies chest pain, SOB, dizziness, or heart palpitations.  Taking meds as directed w/o problems.  Denies medication side effects.    Hyperlpidemia - Still feels like still having some forgetfullness.  Thought not as bad as when was taking simvastatin.  She is taking 1/2 tab every other day.    Femoral cutaneous neuropathy-she's now been off of them are Neurontin for a couple months and is doing fantastic without it. She's not had any recurrence of pain.  She says she forgot where she put her lipstick and her lipstick pencil the other day and it worried her that she might be getting early Alzheimer's. She wasn't sure if it was related to her statin or not. Her last many mental status exam was about 4 years ago when she was having memory issues when she was on simvastatin.  Review of Systems     Objective:   Physical Exam  Constitutional: She is oriented to person, place, and time. She appears well-developed and well-nourished.  HENT:  Head: Normocephalic and atraumatic.  Cardiovascular: Normal rate, regular rhythm and normal heart sounds.   Pulmonary/Chest: Effort normal and breath sounds normal.  Neurological: She is alert and oriented to person, place, and time.  Skin: Skin is warm and dry.  Psychiatric: She has a normal mood and affect. Her behavior is normal.          Assessment & Plan:  HTN - well controlled.  F/u in 6 months.    Hyperlipidemia- HDL came up.  cotninue lipitor. Try to increas to daily and see if affectes memory.   Lab Results  Component Value Date   LDLCALC 70 06/17/2014   Concerns for memory issues-we had a long discussion today about things to look out for. If she's driving and forgets where she is going, if she's losing things very frequently, if she's missed placing things into our locations like putting the dishes up in a strange  location then please let me know. We can always repeat a Mini-Mental Status exam at any point in time and follow her very closely.  History of CVA-reminded her of the importance of being on a statin to reduce her risk of recurrence.  Vit d def- due to recheck levels.

## 2014-07-11 DIAGNOSIS — L821 Other seborrheic keratosis: Secondary | ICD-10-CM | POA: Diagnosis not present

## 2014-07-11 DIAGNOSIS — Z85828 Personal history of other malignant neoplasm of skin: Secondary | ICD-10-CM | POA: Diagnosis not present

## 2014-07-11 DIAGNOSIS — L814 Other melanin hyperpigmentation: Secondary | ICD-10-CM | POA: Diagnosis not present

## 2014-08-05 ENCOUNTER — Other Ambulatory Visit: Payer: Self-pay | Admitting: Family Medicine

## 2014-09-16 ENCOUNTER — Encounter: Payer: Self-pay | Admitting: Obstetrics & Gynecology

## 2014-09-16 ENCOUNTER — Ambulatory Visit (INDEPENDENT_AMBULATORY_CARE_PROVIDER_SITE_OTHER): Payer: Medicare Other | Admitting: Obstetrics & Gynecology

## 2014-09-16 ENCOUNTER — Other Ambulatory Visit (HOSPITAL_COMMUNITY)
Admission: RE | Admit: 2014-09-16 | Discharge: 2014-09-16 | Disposition: A | Discharge status: Home / Self Care | Payer: Medicare Other | Source: Ambulatory Visit | Attending: Obstetrics & Gynecology | Admitting: Obstetrics & Gynecology

## 2014-09-16 VITALS — BP 123/69 | HR 62 | Resp 16 | Ht 67.0 in | Wt 195.0 lb

## 2014-09-16 DIAGNOSIS — Z01419 Encounter for gynecological examination (general) (routine) without abnormal findings: Secondary | ICD-10-CM

## 2014-09-16 DIAGNOSIS — Z1272 Encounter for screening for malignant neoplasm of vagina: Secondary | ICD-10-CM | POA: Diagnosis not present

## 2014-09-16 DIAGNOSIS — M8580 Other specified disorders of bone density and structure, unspecified site: Secondary | ICD-10-CM

## 2014-09-16 DIAGNOSIS — Z1151 Encounter for screening for human papillomavirus (HPV): Secondary | ICD-10-CM | POA: Insufficient documentation

## 2014-09-16 DIAGNOSIS — Z124 Encounter for screening for malignant neoplasm of cervix: Secondary | ICD-10-CM | POA: Insufficient documentation

## 2014-09-16 DIAGNOSIS — Z8542 Personal history of malignant neoplasm of other parts of uterus: Secondary | ICD-10-CM

## 2014-09-16 MED ORDER — CALCIUM-VITAMIN D-VITAMIN K 500-100-40 MG-UNT-MCG PO CHEW: 1.0000 | CHEWABLE_TABLET | Freq: Every morning | ORAL | Status: DC

## 2014-09-16 NOTE — Progress Notes (Signed)
   Subjective:    Patient ID: Michelle [name], female    DOB: [birth year], 81 y.o.   MRN: [id number]  HPI  81 year old female who presents for Pap smear. She has a history of endometrial cancer and treated with hysterectomy by Dr. Janie Morning. Operative note is in the chart. Patient denies any vaginal bleeding. She denies any pelvic discomfort. She sometimes has pain in her right groin from lymphedema. She's been to physical therapy and it has helped this. Patient is not sexually active. Her husband is being treated for prostate cancer at this time. Patient only complaint is intermittent pain over lower left rib. Patient will follow Dr. Charise Carwin about this. She is a former smoker.  Review of Systems  Constitutional: Negative.   HENT: Negative.   Respiratory: Negative.   Cardiovascular: Negative.   Gastrointestinal: Negative.   Genitourinary: Negative.   Musculoskeletal:       Pain over left lower rib  Skin: Negative.   Allergic/Immunologic: Negative.   Neurological: Negative.   Psychiatric/Behavioral: Negative.        Objective:   Physical Exam  Constitutional: She is oriented to person, place, and time. She appears well-developed and well-nourished. No distress.  HENT:  Head: Normocephalic and atraumatic.  Eyes: Conjunctivae are normal.  Cardiovascular: Normal rate and regular rhythm.   Pulmonary/Chest: Effort normal. No respiratory distress. She has no wheezes.  Abdominal: Soft. She exhibits no distension. There is no tenderness. There is no rebound.  Genitourinary: Vagina normal.  Tanner V Atrohpic vagina, cuff intact with no mass  No bleeding following papsmear No pelvic amsses (uterus and ovaries are surgically absent)   Musculoskeletal: She exhibits edema.  Neurological: She is alert and oriented to person, place, and time.  Skin: Skin is warm and dry.  Psychiatric: She has a normal mood and affect.  Vitals reviewed.         Assessment & Plan:  81 yo female for  pap smear following endometrial cnacer No symptoms  1-mammogram in September 2016 2-Needs Dexa and increase calcium 3-Dr. Joellyn Quails to evaluate left rib pain. 4-Viactiv calcium 1 tablet daily

## 2014-09-23 ENCOUNTER — Telehealth: Payer: Self-pay | Admitting: *Deleted

## 2014-09-23 LAB — CYTOLOGY - PAP

## 2014-09-23 NOTE — Telephone Encounter (Signed)
-----   Message from Guss Bunde, MD sent at 09/23/2014  1:53 PM EDT ----- Pt needs repeat pap due to lack of cells.  Can we please have her come back again.

## 2014-09-23 NOTE — Telephone Encounter (Signed)
Pt notified that not enough cells were collected for her pap smear.  She is to return @ no charge to her for a repeat.

## 2014-09-29 ENCOUNTER — Encounter: Payer: Self-pay | Admitting: Obstetrics & Gynecology

## 2014-09-29 ENCOUNTER — Ambulatory Visit: Payer: Medicare Other | Admitting: Obstetrics & Gynecology

## 2014-09-29 VITALS — BP 122/78 | HR 78 | Resp 16 | Ht 67.0 in | Wt 195.0 lb

## 2014-09-29 DIAGNOSIS — Z8542 Personal history of malignant neoplasm of other parts of uterus: Secondary | ICD-10-CM

## 2014-09-29 NOTE — Progress Notes (Signed)
Pap returned acellular.  Pap taken again today.  Two paddles used to assure good specimen.  Dr. Joya Martyr notified about need for specimen to be looked at carefully.  No charge.  Michelle [name] H.

## 2014-10-01 LAB — CYTOLOGY - PAP

## 2014-10-02 ENCOUNTER — Telehealth: Payer: Self-pay | Admitting: *Deleted

## 2014-10-02 NOTE — Telephone Encounter (Signed)
-----   Message from Guss Bunde, MD sent at 10/01/2014  7:44 PM EDT ----- Pap smear is negative.  Call patient with results.

## 2014-10-02 NOTE — Telephone Encounter (Signed)
LM on voicemail of neg pap smear.

## 2014-10-09 ENCOUNTER — Ambulatory Visit: Payer: Self-pay | Admitting: Obstetrics & Gynecology

## 2014-10-15 ENCOUNTER — Other Ambulatory Visit: Payer: Self-pay | Admitting: Family Medicine

## 2014-10-20 ENCOUNTER — Ambulatory Visit: Payer: Medicare Other | Admitting: Family Medicine

## 2014-10-21 ENCOUNTER — Encounter: Payer: Self-pay | Admitting: Family Medicine

## 2014-10-21 ENCOUNTER — Ambulatory Visit (INDEPENDENT_AMBULATORY_CARE_PROVIDER_SITE_OTHER): Payer: Medicare Other | Admitting: Family Medicine

## 2014-10-21 VITALS — BP 125/75 | HR 62 | Temp 98.4°F | Ht 67.0 in | Wt 195.0 lb

## 2014-10-21 DIAGNOSIS — I10 Essential (primary) hypertension: Secondary | ICD-10-CM | POA: Diagnosis not present

## 2014-10-21 DIAGNOSIS — E559 Vitamin D deficiency, unspecified: Secondary | ICD-10-CM

## 2014-10-21 DIAGNOSIS — R079 Chest pain, unspecified: Secondary | ICD-10-CM

## 2014-10-21 DIAGNOSIS — Z23 Encounter for immunization: Secondary | ICD-10-CM | POA: Diagnosis not present

## 2014-10-21 NOTE — Progress Notes (Signed)
   Subjective:    Patient ID: Michelle [name], female    DOB: 1932-03-18, 81 y.o.   MRN: [id number]  HPI Hypertension- Pt denies chest pain, SOB, dizziness, or heart palpitations.  Taking meds as directed w/o problems.  Denies medication side effects.    Vitamin D def - she is taking Viativ chews and extra vitamin D 1000 IU. She wanted to make sure it was ok.    Has had some intermittant pain in the LUQ just her her breast. Has been going on for years.  Says recently it was more intense and almost called 911. Lasted about 10 min.  Then suddenly stopped. Hx of hiatal hernia    Review of Systems     Objective:   Physical Exam  Constitutional: She is oriented to person, place, and time. She appears well-developed and well-nourished.  HENT:  Head: Normocephalic and atraumatic.  Cardiovascular: Normal rate, regular rhythm and normal heart sounds.   Pulmonary/Chest: Effort normal and breath sounds normal.  Abdominal: Soft. Bowel sounds are normal. She exhibits no distension and no mass. There is no tenderness. There is no rebound and no guarding.  Neurological: She is alert and oriented to person, place, and time.  Skin: Skin is warm and dry.  Psychiatric: She has a normal mood and affect. Her behavior is normal.          Assessment & Plan:  HTN - well controlled. F/U in 6 months. Labs up to date.   Vit D - due to recheck levels.  Ok to go down ot maintenance dose.    Left side chest pain - most suspicious for hiatal hernia.  Her sxs hae resolved. Let me know if recurs.  Doesn't sound caonsistant with cardiac dz.

## 2014-10-22 LAB — VITAMIN D 25 HYDROXY (VIT D DEFICIENCY, FRACTURES): Vit D, 25-Hydroxy: 25 ng/mL — ABNORMAL LOW (ref 30–100)

## 2014-10-23 ENCOUNTER — Other Ambulatory Visit: Payer: Self-pay | Admitting: *Deleted

## 2014-10-23 DIAGNOSIS — E559 Vitamin D deficiency, unspecified: Secondary | ICD-10-CM

## 2014-10-27 DIAGNOSIS — M899 Disorder of bone, unspecified: Secondary | ICD-10-CM | POA: Diagnosis not present

## 2014-10-27 DIAGNOSIS — M810 Age-related osteoporosis without current pathological fracture: Secondary | ICD-10-CM | POA: Diagnosis not present

## 2014-10-27 DIAGNOSIS — Z853 Personal history of malignant neoplasm of breast: Secondary | ICD-10-CM | POA: Diagnosis not present

## 2014-10-27 DIAGNOSIS — R921 Mammographic calcification found on diagnostic imaging of breast: Secondary | ICD-10-CM | POA: Diagnosis not present

## 2014-11-02 ENCOUNTER — Telehealth: Payer: Self-pay | Admitting: Family Medicine

## 2014-11-02 NOTE — Telephone Encounter (Signed)
Call pt: T score of -1.7 on bone density. Still in the mildly thin range.  Continue with diet, exercise and regular calcium with vit D intake. F/U in 2 years.

## 2014-11-03 ENCOUNTER — Other Ambulatory Visit: Payer: Self-pay | Admitting: Family Medicine

## 2014-11-04 NOTE — Telephone Encounter (Signed)
Its ok to take both

## 2014-11-04 NOTE — Telephone Encounter (Signed)
Pt informed. She is taking the vit D 1000 IU and the viactive chew at night that has 500 IU of D and 825 mg calicium. She wanted to know if she should take both?Michelle [name], [name]

## 2014-11-06 NOTE — Telephone Encounter (Signed)
Pt informed.Michelle [name]  

## 2014-11-18 ENCOUNTER — Encounter: Payer: Self-pay | Admitting: Family Medicine

## 2014-11-19 ENCOUNTER — Telehealth: Payer: Self-pay | Admitting: Family Medicine

## 2014-11-19 ENCOUNTER — Encounter: Payer: Self-pay | Admitting: Family Medicine

## 2014-11-19 DIAGNOSIS — M810 Age-related osteoporosis without current pathological fracture: Secondary | ICD-10-CM

## 2014-11-19 HISTORY — DX: Age-related osteoporosis without current pathological fracture: M81.0

## 2014-11-19 NOTE — Telephone Encounter (Signed)
Please call patient: Bone density shows that she is osteopenic. This means her bones are mildly thin. Though her risk for fracture is elevated. Based on the risk of fracture it is recommended that she had she start a bisphosphonate which is a Chief Executive Officer. If she is okay with doing so then please let me know and I can send in a prescription to the pharmacy. These are typically taken once a week. And she must remain upright for couple hours after taking the medication. There is a small risk of osteonecrosis of the jaw if she is going to have dental work done.

## 2014-11-20 MED ORDER — ALENDRONATE SODIUM 70 MG PO TABS: 70.0000 mg | ORAL_TABLET | ORAL | Status: DC

## 2014-11-20 NOTE — Telephone Encounter (Signed)
rx sent

## 2014-11-20 NOTE — Telephone Encounter (Signed)
Pt informed and she is ok with starting new med.Michelle [name] [place]

## 2014-12-11 ENCOUNTER — Encounter: Payer: Self-pay | Admitting: Family Medicine

## 2015-04-21 ENCOUNTER — Ambulatory Visit: Payer: Self-pay | Admitting: Family Medicine

## 2015-04-26 ENCOUNTER — Other Ambulatory Visit: Payer: Self-pay | Admitting: Family Medicine

## 2015-05-12 ENCOUNTER — Encounter: Payer: Self-pay | Admitting: Family Medicine

## 2015-05-12 ENCOUNTER — Ambulatory Visit (INDEPENDENT_AMBULATORY_CARE_PROVIDER_SITE_OTHER): Payer: Medicare Other | Admitting: Family Medicine

## 2015-05-12 VITALS — BP 139/78 | HR 60

## 2015-05-12 DIAGNOSIS — M8580 Other specified disorders of bone density and structure, unspecified site: Secondary | ICD-10-CM

## 2015-05-12 DIAGNOSIS — E559 Vitamin D deficiency, unspecified: Secondary | ICD-10-CM

## 2015-05-12 DIAGNOSIS — E785 Hyperlipidemia, unspecified: Secondary | ICD-10-CM

## 2015-05-12 DIAGNOSIS — I10 Essential (primary) hypertension: Secondary | ICD-10-CM | POA: Diagnosis not present

## 2015-05-12 DIAGNOSIS — Z0000 Encounter for general adult medical examination without abnormal findings: Secondary | ICD-10-CM

## 2015-05-12 DIAGNOSIS — R238 Other skin changes: Secondary | ICD-10-CM | POA: Diagnosis not present

## 2015-05-12 DIAGNOSIS — R233 Spontaneous ecchymoses: Secondary | ICD-10-CM

## 2015-05-12 DIAGNOSIS — M7051 Other bursitis of knee, right knee: Secondary | ICD-10-CM

## 2015-05-12 NOTE — Progress Notes (Signed)
Subjective:    Michelle [name] is a 82 y.o. female who presents for Medicare Annual/Subsequent preventive examination.  Preventive Screening-Counseling & Management  Tobacco History  Smoking status  . Former Smoker -- 1.00 packs/day for 30 years  . Quit date: 02/01/1996  Smokeless tobacco  . Never Used     Problems Prior to Visit 1. C/O pain on The inside of her right knee. She says it bothers her when she goes up and down steps at times. She also noticed a knot behind the knee a couple of months ago and has had some soreness there is well but that actually seems to a little bit better. She's not currently taking any medications for it.    Current Problems (verified) Patient Active Problem List   Diagnosis Date Noted  . Osteopenia 11/19/2014  . Aneurysm of infrarenal abdominal aorta (HCC) 11/19/2013  . History of endometrial cancer 04/30/2013  . Squamous cell skin cancer, face 03/22/2012  . Macular degeneration 09/13/2011  . Obese 09/13/2011  . Breast cancer, female ([place]) 03/17/2011  . Essential hypertension, benign 09/26/2007  . OBESITY 09/24/2007  . History of CVA (cerebrovascular accident) 09/24/2007  . Hyperlipidemia LDL goal <100 11/03/2005  . GERD 11/03/2005    Medications Prior to Visit Current Outpatient Prescriptions on File Prior to Visit  Medication Sig Dispense Refill  . acetaminophen (TYLENOL) 325 MG tablet Take 650 mg by mouth as needed.    Marland Kitchen alendronate (FOSAMAX) 70 MG tablet Take 1 tablet (70 mg total) by mouth every 7 (seven) days. Take with a full glass of water on an empty stomach. 4 tablet 11  . AMBULATORY NON FORMULARY MEDICATION Medication Name: Moderate grade compression stocking to just below the knee.  Dx is venous stasis. 20-30mmgHg. 2 Units PRN  . aspirin 325 MG tablet Take 325 mg by mouth at bedtime.      Marland Kitchen atorvastatin (LIPITOR) 40 MG tablet Take 20 mg by mouth every other day.     . cholecalciferol (VITAMIN D) 1000 UNITS tablet Take 1,000 Units  by mouth daily.    . clopidogrel (PLAVIX) 75 MG tablet TAKE 1 TABLET BY MOUTH EVERY DAY 90 tablet 2  . fluticasone (FLONASE) 50 MCG/ACT nasal spray SPRITZ 2 SPRAYS INTO EACH NOSTRIL DAILY 16 g 2  . lansoprazole (PREVACID) 15 MG capsule Take 30 mg by mouth daily at 12 noon.    Marland Kitchen losartan (COZAAR) 25 MG tablet TAKE 1 TABLET (25 MG TOTAL) BY MOUTH DAILY. 90 tablet 3  . VIACTIV [id number] MG-UNT-MCG CHEW CHEW 1 TABLET BY MOUTH EVERY MORNING.  12   No current facility-administered medications on file prior to visit.    Current Medications (verified) Current Outpatient Prescriptions  Medication Sig Dispense Refill  . acetaminophen (TYLENOL) 325 MG tablet Take 650 mg by mouth as needed.    Marland Kitchen alendronate (FOSAMAX) 70 MG tablet Take 1 tablet (70 mg total) by mouth every 7 (seven) days. Take with a full glass of water on an empty stomach. 4 tablet 11  . AMBULATORY NON FORMULARY MEDICATION Medication Name: Moderate grade compression stocking to just below the knee.  Dx is venous stasis. 20-29mmgHg. 2 Units PRN  . aspirin 325 MG tablet Take 325 mg by mouth at bedtime.      Marland Kitchen atorvastatin (LIPITOR) 40 MG tablet Take 20 mg by mouth every other day.     . cholecalciferol (VITAMIN D) 1000 UNITS tablet Take 1,000 Units by mouth daily.    . clopidogrel (PLAVIX) 75  MG tablet TAKE 1 TABLET BY MOUTH EVERY DAY 90 tablet 2  . fluticasone (FLONASE) 50 MCG/ACT nasal spray SPRITZ 2 SPRAYS INTO EACH NOSTRIL DAILY 16 g 2  . lansoprazole (PREVACID) 15 MG capsule Take 30 mg by mouth daily at 12 noon.    Marland Kitchen losartan (COZAAR) 25 MG tablet TAKE 1 TABLET (25 MG TOTAL) BY MOUTH DAILY. 90 tablet 3  . VIACTIV [id number] MG-UNT-MCG CHEW CHEW 1 TABLET BY MOUTH EVERY MORNING.  12   No current facility-administered medications for this visit.     Allergies (verified) Benadryl; Doxycycline; Simvastatin; and Lisinopril   PAST HISTORY  Family History Family History  Problem Relation Age of Onset  . Cancer Sister 49     Lymphoma  . Cancer Paternal Aunt     Breast cancer  . Cancer Daughter 57    Bilateral Breast Cancer  . Cancer Daughter     Breast Cancer  . Cancer Sister     2005 - Renal Cell Cancer and Breast Cancer  . Heart disease Father     Social History Social History  Substance Use Topics  . Smoking status: Former Smoker -- 1.00 packs/day for 30 years    Quit date: 02/01/1996  . Smokeless tobacco: Never Used  . Alcohol Use: 0.6 oz/week    1 Glasses of wine per week     Comment: Ocassionaly once a year .     Are there smokers in your home (other than you)? No  Risk Factors Current exercise habits: The patient does not participate in regular exercise at present. though she plans to start Silver Sneakers soon.  Dietary issues discussed: None   Cardiac risk factors: advanced age (older than 58 for men, 24 for women) and hypertension.  Depression Screen (Note: if answer to either of the following is "Yes", a more complete depression screening is indicated)   Over the past two weeks, have you felt down, depressed or hopeless? No  Over the past two weeks, have you felt little interest or pleasure in doing things? No  Have you lost interest or pleasure in daily life? No  Do you often feel hopeless? No  Do you cry easily over simple problems? No  Activities of Daily Living In your present state of health, do you have any difficulty performing the following activities?:  Driving? No Managing money?  No Feeding yourself? No Getting from bed to chair? No   Climbing a flight of stairs? Yes Preparing food and eating?: No Bathing or showering? No Getting dressed: No Getting to the toilet? No Using the toilet:No Moving around from place to place: No In the past year have you fallen or had a near fall?:No   Hearing Difficulties: No Do you often ask people to speak up or repeat themselves? No Do you experience ringing or noises in your ears? No Do you have difficulty understanding soft  or whispered voices? No   Do you feel that you have a problem with memory? Yes  Do you often misplace items? No  Do you feel safe at home?  Yes  Cognitive Testing  Alert? Yes  Normal Appearance?Yes  Oriented to person? Yes  Place? Yes   Time? Yes  Recall of three objects?  Yes  Can perform simple calculations? Yes  Displays appropriate judgment?Yes  Can read the correct time from a watch face?Yes   Advanced Directives have been discussed with the patient? Yes  List the Names of Other Physician/Practitioners you currently use: 1.  Indicate any recent Medical Services you may have received from other than [facility] providers in the past year (date may be approximate).  Immunization History  Administered Date(s) Administered  . Influenza Split 12/08/2010, 11/01/2011  . Influenza Whole 12/13/2006, 11/19/2008, 11/11/2009  . Influenza,inj,Quad PF,36+ Mos 11/22/2012, 11/19/2013, 10/21/2014  . Pneumococcal Conjugate-13 02/18/2014  . Pneumococcal Polysaccharide-23 12/13/2006  . Tdap 10/06/2010  . Zoster 12/08/2010    Screening Tests Health Maintenance  Topic Date Due  . INFLUENZA VACCINE  09/01/2015  . COLONOSCOPY  09/19/2015  . TETANUS/TDAP  10/05/2020  . DEXA SCAN  Completed  . ZOSTAVAX  Completed  . PNA vac Low Risk Adult  Completed    All answers were reviewed with the patient and necessary referrals were made:  METHENEY,CATHERINE, MD   05/12/2015   History reviewed: allergies, current medications, past family history, past medical history, past social history, past surgical history and problem list  Review of Systems A comprehensive review of systems was negative.    Objective:     Vision by Snellen chart: right eye:20/30, left eye:20/30 with correction   There is no weight on file to calculate BMI. BP 139/78 mmHg  Pulse 60  SpO2 99%  BP 139/78 mmHg  Pulse 60  SpO2 99% General appearance: alert, cooperative and appears stated age Head: Normocephalic, without  obvious abnormality, atraumatic Eyes: conj clear, EOMI, PEERLA Ears: normal TM's and external ear canals both ears Nose: Nares normal. Septum midline. Mucosa normal. No drainage or sinus tenderness. Throat: lips, mucosa, and tongue normal; teeth and gums normal Neck: no adenopathy, no carotid bruit, no JVD, supple, symmetrical, trachea midline and thyroid not enlarged, symmetric, no tenderness/mass/nodules Back: symmetric, no curvature. ROM normal. No CVA tenderness. Lungs: clear to auscultation bilaterally Heart: regular rate and rhythm, S1, S2 normal, no murmur, click, rub or gallop Abdomen: soft, non-tender; bowel sounds normal; no masses,  no organomegaly Extremities: Trace edema around the right ankle Pulses: 2+ and symmetric Skin: Skin color, texture, turgor normal. No rashes or lesions Lymph nodes: Cervical, supraclavicular, and axillary nodes normal. Neurologic: Alert and oriented X 3, normal strength and tone. Normal symmetric reflexes. Normal coordination and gait     Knee with normal range of motion. She is tender over the right has anserine bursa.   Assessment:     Medicare Wellness Exam       Plan:     During the course of the visit the patient was educated and counseled about appropriate screening and preventive services including:    Needs up to date labs  Vit D def/osteopenia  - due to recheck levels.   HTN_ well controlled. On ARB. Due for BMP.   Has anserine bursitis-given exercises and stretches to do on her own at home.  Diet review for nutrition referral? Yes ____  Not Indicated _X__   Patient Instructions (the written plan) was given to the patient.  Medicare Attestation I have personally reviewed: The patient's medical and social history Their use of alcohol, tobacco or illicit drugs Their current medications and supplements The patient's functional ability including ADLs,fall risks, home safety risks, cognitive, and hearing and visual  impairment Diet and physical activities Evidence for depression or mood disorders  The patient's weight, height, BMI, and visual acuity have been recorded in the chart.  I have made referrals, counseling, and provided education to the patient based on review of the above and I have provided the patient with a written personalized care plan for preventive services.  METHENEY,CATHERINE, MD   05/12/2015

## 2015-05-13 LAB — LIPID PANEL
Cholesterol: 148 mg/dL (ref 125–200)
HDL: 54 mg/dL (ref >=46)
LDL CALC: 66 mg/dL (ref <130)
TRIGLYCERIDES: 140 mg/dL (ref <150)
Total CHOL/HDL Ratio: 2.7 Ratio (ref <=5.0)
VLDL: 28 mg/dL (ref <30)

## 2015-05-13 LAB — BASIC METABOLIC PANEL WITH GFR
BUN: 9 mg/dL (ref 7–25)
CHLORIDE: 104 mmol/L (ref 98–110)
CO2: 26 mmol/L (ref 20–31)
Calcium: 9.6 mg/dL (ref 8.6–10.4)
Creat: 0.93 mg/dL — ABNORMAL HIGH (ref 0.60–0.88)
GFR, EST AFRICAN AMERICAN: 67 mL/min (ref >=60)
GFR, EST NON AFRICAN AMERICAN: 58 mL/min — AB (ref >=60)
GLUCOSE: 87 mg/dL (ref 65–99)
POTASSIUM: 4.6 mmol/L (ref 3.5–5.3)
Sodium: 140 mmol/L (ref 135–146)

## 2015-05-13 LAB — CBC
HEMATOCRIT: 39.2 % (ref 35.0–45.0)
HEMOGLOBIN: 13.1 g/dL (ref 11.7–15.5)
MCH: 32.8 pg (ref 27.0–33.0)
MCHC: 33.4 g/dL (ref 32.0–36.0)
MCV: 98.2 fL (ref 80.0–100.0)
MPV: 9.7 fL (ref 7.5–12.5)
Platelets: 269 10*3/uL (ref 140–400)
RBC: 3.99 MIL/uL (ref 3.80–5.10)
RDW: 12.7 % (ref 11.0–15.0)
WBC: 9 10*3/uL (ref 3.8–10.8)

## 2015-05-13 LAB — VITAMIN D 25 HYDROXY (VIT D DEFICIENCY, FRACTURES): Vit D, 25-Hydroxy: 32 ng/mL (ref 30–100)

## 2015-05-14 ENCOUNTER — Telehealth: Payer: Self-pay | Admitting: *Deleted

## 2015-05-14 NOTE — Telephone Encounter (Signed)
Pt wanted to know about how she should go about dc'ing Prevacid. She understands that she will need to change her diet or if there may be something else she can take in its place. Please advise.[name], [name]

## 2015-05-17 NOTE — Telephone Encounter (Signed)
If she is taking 30mg  then can start by dec to 15mg  daily for 10 days, then decrease to every other day for 14 days, then every 3rd day for 14 days, then stop.

## 2015-05-18 NOTE — Telephone Encounter (Signed)
Pt advised of recommendation, verbalized understanding. No further questions.  

## 2015-05-20 ENCOUNTER — Telehealth: Payer: Self-pay | Admitting: Family Medicine

## 2015-05-20 NOTE — Telephone Encounter (Signed)
Please call patient and let her know that I did look over the add that she brought to me about laser treatment for different types of low back pain and neuropathy. I could not find any current literature that actually supports its use. Though it does not seem harmful. I would not recommend spending out of pocket money on this but if it is covered through her insurance she could certainly try it if she would like, since it's not harmful.

## 2015-05-21 NOTE — Telephone Encounter (Signed)
Pt advised of PCP recommendation, verbalized understanding. She is under the impression it is covered by insurance, if so she may proceed. Pt had no further questions at this time.

## 2015-06-19 ENCOUNTER — Telehealth: Payer: Self-pay | Admitting: Family Medicine

## 2015-06-19 NOTE — Telephone Encounter (Signed)
Pt called clinic stating she was advised she has knee bursitis. Pt wants to know what she should do about this? She has tried both hot and cold compresses which help temporarily but overall her knee is worse. Pt states she is fine with going to Ortho in [place] if that is what PCP recommends.

## 2015-06-22 ENCOUNTER — Ambulatory Visit (INDEPENDENT_AMBULATORY_CARE_PROVIDER_SITE_OTHER): Payer: Medicare Other | Admitting: Sports Medicine

## 2015-06-22 ENCOUNTER — Ambulatory Visit (INDEPENDENT_AMBULATORY_CARE_PROVIDER_SITE_OTHER): Payer: Medicare Other

## 2015-06-22 VITALS — BP 130/71 | HR 64 | Resp 18 | Wt 187.7 lb

## 2015-06-22 DIAGNOSIS — M1711 Unilateral primary osteoarthritis, right knee: Secondary | ICD-10-CM | POA: Diagnosis not present

## 2015-06-22 DIAGNOSIS — M7651 Patellar tendinitis, right knee: Secondary | ICD-10-CM | POA: Diagnosis not present

## 2015-06-22 DIAGNOSIS — M170 Bilateral primary osteoarthritis of knee: Secondary | ICD-10-CM | POA: Diagnosis not present

## 2015-06-22 DIAGNOSIS — M25562 Pain in left knee: Secondary | ICD-10-CM

## 2015-06-22 HISTORY — DX: Unilateral primary osteoarthritis, right knee: M17.11

## 2015-06-22 MED ORDER — MELOXICAM 15 MG PO TABS: ORAL_TABLET | ORAL | Status: DC

## 2015-06-22 NOTE — Assessment & Plan Note (Signed)
Starting with meloxicam, x-rays, formal physical therapy. Return in one month, injection if no better.

## 2015-06-22 NOTE — Progress Notes (Signed)
   Subjective:    I'm seeing this patient as a consultation for:  Dr. Beatrice Lecher  CC: Right knee pain  HPI: For 2 weeks this pleasant 82 year old female has had pain that she localizes along the medial joint line of the right knee, moderate, persistent, hasn't taken any NSAIDs or done any therapy, has not had injections. No mechanical symptoms. No trauma.  Past medical history, Surgical history, Family history not pertinant except as noted below, Social history, Allergies, and medications have been entered into the medical record, reviewed, and no changes needed.   Review of Systems: No headache, visual changes, nausea, vomiting, diarrhea, constipation, dizziness, abdominal pain, skin rash, fevers, chills, night sweats, weight loss, swollen lymph nodes, body aches, joint swelling, muscle aches, chest pain, shortness of breath, mood changes, visual or auditory hallucinations.   Objective:   General: Well Developed, well nourished, and in no acute distress.  Neuro/Psych: Alert and oriented x3, extra-ocular muscles intact, able to move all 4 extremities, sensation grossly intact. Skin: Warm and dry, no rashes noted.  Respiratory: Not using accessory muscles, speaking in full sentences, trachea midline.  Cardiovascular: Pulses palpable, no extremity edema. Abdomen: Does not appear distended. Right Knee: Normal to inspection with no erythema or effusion or obvious bony abnormalities. Tender to palpation along the medial joint line ROM normal in flexion and extension and lower leg rotation. Ligaments with solid consistent endpoints including ACL, PCL, LCL, MCL. Negative Mcmurray's and provocative meniscal tests. Non painful patellar compression. Patellar and quadriceps tendons unremarkable. Hamstring and quadriceps strength is normal.  Impression and Recommendations:   This case required medical decision making of moderate complexity.

## 2015-06-22 NOTE — Telephone Encounter (Signed)
Lets try to get her in with one of our sports med docs if she is okay with that.

## 2015-06-22 NOTE — Telephone Encounter (Signed)
Pt advised of PCP recommendation. Pt is considering going to ortho in [place] anyway. Advised Pt to let us know if that's what she decides to do so we can place a referral. Verbalized understanding.

## 2015-07-01 ENCOUNTER — Ambulatory Visit: Payer: Medicare Other | Attending: Sports Medicine | Admitting: Physical Therapy

## 2015-07-01 DIAGNOSIS — M25561 Pain in right knee: Secondary | ICD-10-CM | POA: Diagnosis not present

## 2015-07-01 DIAGNOSIS — R2689 Other abnormalities of gait and mobility: Secondary | ICD-10-CM | POA: Insufficient documentation

## 2015-07-01 DIAGNOSIS — M6281 Muscle weakness (generalized): Secondary | ICD-10-CM | POA: Diagnosis not present

## 2015-07-01 NOTE — Therapy (Addendum)
[place], [place], [address] Phone: [phone]   Fax:  [phone]  Physical Therapy Evaluation / Discharge Note  Patient Details  Name: Michelle [name] MRN: [id number] Date of Birth: 1932-03-10 Referring Provider: Silverio Decamp, MD  Encounter Date: 07/01/2015      PT End of Session - 07/01/15 1746    Visit Number 1   Number of Visits 4   Date for PT Re-Evaluation 08/12/15   Authorization Type Medicare:Kx mod by 15th visit, progress note by 10th visit   PT Start Time 1415   PT Stop Time 1504   PT Time Calculation (min) 49 min   Activity Tolerance Patient tolerated treatment well   Behavior During Therapy [facility] for tasks assessed/performed      Past Medical History  Diagnosis Date  . GERD (gastroesophageal reflux disease)   . Hyperlipidemia   . Shingles 2005  . Hypertension   . History of cerebrovascular accident   . Hiatal hernia 2008  . Bradycardia   . Polyp of colon   . Cataract   . Macular degeneration   . Vocal cord polyp   . Stroke ([place]) 09/2007    Slight memory problems residual  . Tendonitis     rt hand  . History of breast cancer   . Cancer ([place]) 10/28/2009    squamous cell - upper right arm  . Cancer ([place]) 10/28/09    basal cell -upper right back  . Breast cancer ([place]) 03/2011  . Endometrial cancer ([place]) 03/2013    uterine ca  . Endometrial cancer [facility])     Past Surgical History  Procedure Laterality Date  . Cholecystectomy  2004  . Biospy  10/28/09    Shave Biospy skin Left hand(Acitinic Keratoses), Right Upper Arm (superficial Basal Cell), Right Upper Back(Superficial Basal Cell), Left Neck ( Solar Lentigo & Seborrheic Keratoses)  . Vocal cord poylps  1969    excision  . Tonsillectomy    . Colonoscopy    . Breast mammosite  03/14/2011    Procedure: MAMMOSITE BREAST;  Surgeon: Rolm Bookbinder, MD;  Location: [facility];  Service: General;  Laterality:  Right;  . Breast biopsy  02/11/11    Right Breast Needle Core Biospy - Upper Outer Quadrant; ER/PR 100%, Her-2 Neu neg.; Ki-67 10%  . Breast surgery      right breast lumpectomy snbx  . Basal cell carcinoma excision    . Squamous cell carcinoma excision    . Colonoscopy w/ polypectomy    . Tear duct probing    . Dilation and curettage of uterus N/A 04/24/2013    Procedure: DILATATION AND CURETTAGE;  Surgeon: Guss Bunde, MD;  Location: [facility];  Service: Gynecology;  Laterality: N/A;  . Hysteroscopy N/A 04/24/2013    Procedure: HYSTEROSCOPY;  Surgeon: Guss Bunde, MD;  Location: [facility];  Service: Gynecology;  Laterality: N/A;  . Robotic assisted total hysterectomy with bilateral salpingo oopherectomy Bilateral 06/11/2013    Procedure: ROBOTIC ASSISTED TOTAL HYSTERECTOMY WITH BILATERAL SALPINGO OOPHORECTOMY WITH POSSIBLE STAGING, EPISIOTOMY;  Surgeon: Janie Morning, MD;  Location: [facility];  Service: Gynecology;  Laterality: Bilateral;    There were no vitals filed for this visit.       Subjective Assessment - 07/01/15 1431    Subjective pt is a 82 y.o F with CC of R knee pain that has been present since January 2017 with non-truamatic onset with recentl exacerbation in the last  3 weeks doing more walking and standing and her knee has been worse since. s Since the recent onset it hasn't gotten any better and is worse at night. pain starts in inside of the knee and radiates down to the mid shin.    Pertinent History breast cancer and cancer of utuerus , hx of lymphedema   Limitations Sitting;Lifting;Standing;Walking;House hold activities   How long can you sit comfortably? 5-10 min   How long can you stand comfortably? varys but typicall 15 min   How long can you walk comfortably? 20 min   Diagnostic tests x-rays on  R knee   Patient Stated Goals to have no pain, be able ot go up/down stair / inclines without difficulty,    Currently in Pain? Yes   Pain Score 2   took pain medication  this AM   Pain Location Knee   Pain Orientation Right   Pain Descriptors / Indicators Burning;Throbbing   Pain Type --  sub-acute on chronic   Pain Radiating Towards mid lateral shin   Pain Onset More than a month ago   Pain Frequency Constant   Aggravating Factors  going from a sitting to standing postion, prolonged standing/ walking,    Pain Relieving Factors propping the foot up on stool, cold and heat,    Effect of Pain on Daily Activities limited endurance with standing/ walking            [facility] PT Assessment - 07/01/15 1430    Assessment   Medical Diagnosis R knee OA   Referring Provider Silverio Decamp, MD   Onset Date/Surgical Date --  January/2017 recent exacerbation last 3 weeks   Hand Dominance Left   Next MD Visit --  1 month   Prior Therapy No   Precautions   Precautions None   Restrictions   Weight Bearing Restrictions No   Balance Screen   Has the patient fallen in the past 6 months No   Has the patient had a decrease in activity level because of a fear of falling?  No   Is the patient reluctant to leave their home because of a fear of falling?  No   Home Environment   Living Environment Private residence   Living Arrangements Spouse/significant other   Available Help at Discharge Available PRN/intermittently   Type of [place] Access Level entry   [place] One level   Prior Function   Level of Independence Independent;Independent with basic ADLs   Vocation Retired   Leisure going on trips, seeing family   Cognition   Overall Cognitive Status Within Functional Limits for tasks assessed   Observation/Other Assessments   Focus on Therapeutic Outcomes (FOTO)  59% limited  predicted 45% limited   Posture/Postural Control   Posture/Postural Control Postural limitations   Postural Limitations Rounded Shoulders;Forward head   ROM / Strength   AROM / PROM / Strength AROM;Strength;PROM   AROM   AROM Assessment Site Knee   Right/Left  Knee Right;Left   Right Knee Extension -1   Right Knee Flexion 128   Left Knee Extension 130   Left Knee Flexion 0   PROM   Overall PROM  Within functional limits for tasks performed   PROM Assessment Site Knee   Right/Left Knee Right   Strength   Strength Assessment Site Hip;Knee   Right/Left Hip Right;Left   Right Hip Flexion 4-/5   Right Hip Extension 3+/5   Right Hip ABduction 3/5  Right Hip ADduction 4/5   Left Hip Flexion 4-/5   Left Hip Extension 3/5   Left Hip ABduction 3+/5   Left Hip ADduction 4/5   Right/Left Knee Right;Left   Right Knee Flexion 4/5   Right Knee Extension 4/5  pain during testing   Left Knee Flexion 4/5   Left Knee Extension 4/5   Palpation   Palpation comment tendnerness located at the medial joint line, and soreness at the pes anersine and medial patellar retinaculum   Ambulation/Gait   Gait Pattern Step-through pattern;Decreased stride length;Antalgic                           PT Education - 07/01/15 1745    Education provided Yes   Education Details evaluation findings, anatomy of knee in regard to muscles and mechanics of the patella, goals, HEP reps/ sets and proper form and progression.   Person(s) Educated Patient   Methods Explanation;Demonstration;Handout   Comprehension Verbalized understanding;Returned demonstration          PT Short Term Goals - 07/01/15 1758    PT SHORT TERM GOAL #1   Title STG=LTG           PT Long Term Goals - 07/01/15 1758    PT LONG TERM GOAL #1   Title Pt will be I with all HEP as of last visit (08/12/2015)   Time 6   Period Weeks   Status New   PT LONG TERM GOAL #2   Title pt will improve overall hip/ knee strength to >/=4/5 with </=2/10 pain to promote assist with walking/ standing activities and ADLs (08/12/2015)   Time 6   Period Weeks   Status New   PT LONG TERM GOAL #3   Title pt will be able to walk/standing for >/= 45 min and go up/ down >/= 10 steps with </= 2/10  pain to assist with functional mobility and personal goals (08/12/2015)   Time 6   Period Weeks   Status New   PT LONG TERM GOAL #4   Title pt will improver her FOTO score to >/= 55 to demonstrate improvement in function at discharge (08/12/2015)   Time 6               Plan - 07/01/15 1746    Clinical Impression Statement Mrs. Vassel presents to OPPT as a moderate complexity evaluation with CC of chronic knee pain with recent exacerbation 3 weeks ago. she demonstrates functional knee AROM/ PROM, with weakness with bil hips and pain during assessment with R knee extension. pt exhibits anatalgic gait pattern with limited endurance with walking/ standing. pt reported only wanting to do a couple of visits due to insurance cost and limited budget. She would benefit from physical therapy to decrease pain the knee, improve hip strength, improve endurance and mobility and return pt to PLOF by addressing the impairments listed.   Moderate complexity based on PMHx of HTN and cx, exam: weakness, posture and gait abnormalities, limited endurance, evolving symptoms   Rehab Potential Good   PT Frequency Biweekly   PT Duration 6 weeks   PT Treatment/Interventions ADLs/Self Care Home Management;Cryotherapy;Electrical Stimulation;Iontophoresis 62m/ml Dexamethasone;Moist Heat;Therapeutic exercise;Manual techniques;Therapeutic activities;Taping;Dry needling;Patient/family education;Ultrasound;Balance training   PT Next Visit Plan assess/ review HEP, hip/ knee strengthening, manual PRN, updated HEP as need.    PT Home Exercise Plan clamshells, bridge, standing hip abduction, extension, hip flexor stretch, sit to stand, LAQ   Consulted  and Agree with Plan of Care Patient      Patient will benefit from skilled therapeutic intervention in order to improve the following deficits and impairments:  Pain, Improper body mechanics, Postural dysfunction, Decreased endurance, Decreased activity tolerance, Difficulty  walking, Decreased strength, Abnormal gait  Visit Diagnosis: Pain in right knee - Plan: PT plan of care cert/re-cert  Muscle weakness (generalized) - Plan: PT plan of care cert/re-cert  Other abnormalities of gait and mobility - Plan: PT plan of care cert/re-cert      G-Codes - 82/51/89 1801    Functional Assessment Tool Used FOTO/ clinical jugement   Functional Limitation Mobility: Walking and moving around   Mobility: Walking and Moving Around Current Status [phone]) At least 20 percent but less than 40 percent impaired, limited or restricted   Mobility: Walking and Moving Around Goal Status [phone]) At least 1 percent but less than 20 percent impaired, limited or restricted       Problem List Patient Active Problem List   Diagnosis Date Noted  . Primary osteoarthritis of right knee 06/22/2015  . Osteopenia 11/19/2014  . Aneurysm of infrarenal abdominal aorta (HCC) 11/19/2013  . History of endometrial cancer 04/30/2013  . Squamous cell skin cancer, face 03/22/2012  . Macular degeneration 09/13/2011  . Obese 09/13/2011  . Breast cancer, female ([place]) 03/17/2011  . Essential hypertension, benign 09/26/2007  . OBESITY 09/24/2007  . History of CVA (cerebrovascular accident) 09/24/2007  . Hyperlipidemia LDL goal <100 11/03/2005  . GERD 11/03/2005   Starr Lake PT, DPT, LAT, ATC  07/01/2015  6:03 PM      [facility] [address] [place], [place], [address] Phone: [phone]   Fax:  [phone]  Name: Michelle [name] MRN: [id number] Date of Birth: [birth year]   PHYSICAL THERAPY DISCHARGE SUMMARY  Visits from Start of Care: 1  Current functional level related to goals / functional outcomes: See goals   Remaining deficits: unknown   Education / Equipment: HEP  Plan:                                                    Patient goals were not met. Patient is being discharged due to not returning since the last  visit.  ?????         Saachi Zale PT, DPT, LAT, ATC  08/05/2015  4:19 PM

## 2015-07-02 DIAGNOSIS — Z5189 Encounter for other specified aftercare: Secondary | ICD-10-CM

## 2015-07-02 HISTORY — DX: Encounter for other specified aftercare: Z51.89

## 2015-07-04 ENCOUNTER — Inpatient Hospital Stay (HOSPITAL_COMMUNITY)
Admission: EM | Admit: 2015-07-04 | Discharge: 2015-07-09 | DRG: 378 | Disposition: A | Discharge status: Home Health | Payer: Medicare Other | Attending: Oncology | Admitting: Oncology

## 2015-07-04 ENCOUNTER — Emergency Department (HOSPITAL_COMMUNITY): Payer: Medicare Other

## 2015-07-04 ENCOUNTER — Encounter (HOSPITAL_COMMUNITY): Payer: Self-pay | Admitting: Internal Medicine

## 2015-07-04 DIAGNOSIS — I10 Essential (primary) hypertension: Secondary | ICD-10-CM | POA: Diagnosis not present

## 2015-07-04 DIAGNOSIS — K449 Diaphragmatic hernia without obstruction or gangrene: Secondary | ICD-10-CM | POA: Diagnosis not present

## 2015-07-04 DIAGNOSIS — Z7982 Long term (current) use of aspirin: Secondary | ICD-10-CM

## 2015-07-04 DIAGNOSIS — D62 Acute posthemorrhagic anemia: Secondary | ICD-10-CM | POA: Diagnosis present

## 2015-07-04 DIAGNOSIS — Z803 Family history of malignant neoplasm of breast: Secondary | ICD-10-CM

## 2015-07-04 DIAGNOSIS — Z85828 Personal history of other malignant neoplasm of skin: Secondary | ICD-10-CM | POA: Diagnosis not present

## 2015-07-04 DIAGNOSIS — Z7902 Long term (current) use of antithrombotics/antiplatelets: Secondary | ICD-10-CM

## 2015-07-04 DIAGNOSIS — Z881 Allergy status to other antibiotic agents status: Secondary | ICD-10-CM | POA: Diagnosis not present

## 2015-07-04 DIAGNOSIS — H3530 Unspecified macular degeneration: Secondary | ICD-10-CM | POA: Diagnosis present

## 2015-07-04 DIAGNOSIS — R000 Tachycardia, unspecified: Secondary | ICD-10-CM | POA: Diagnosis present

## 2015-07-04 DIAGNOSIS — K922 Gastrointestinal hemorrhage, unspecified: Secondary | ICD-10-CM | POA: Diagnosis not present

## 2015-07-04 DIAGNOSIS — I959 Hypotension, unspecified: Secondary | ICD-10-CM | POA: Diagnosis not present

## 2015-07-04 DIAGNOSIS — M810 Age-related osteoporosis without current pathological fracture: Secondary | ICD-10-CM | POA: Diagnosis present

## 2015-07-04 DIAGNOSIS — Z888 Allergy status to other drugs, medicaments and biological substances status: Secondary | ICD-10-CM

## 2015-07-04 DIAGNOSIS — Z8249 Family history of ischemic heart disease and other diseases of the circulatory system: Secondary | ICD-10-CM

## 2015-07-04 DIAGNOSIS — K2970 Gastritis, unspecified, without bleeding: Secondary | ICD-10-CM | POA: Diagnosis not present

## 2015-07-04 DIAGNOSIS — Z8542 Personal history of malignant neoplasm of other parts of uterus: Secondary | ICD-10-CM

## 2015-07-04 DIAGNOSIS — Z8719 Personal history of other diseases of the digestive system: Secondary | ICD-10-CM

## 2015-07-04 DIAGNOSIS — Z8673 Personal history of transient ischemic attack (TIA), and cerebral infarction without residual deficits: Secondary | ICD-10-CM | POA: Diagnosis not present

## 2015-07-04 DIAGNOSIS — R1012 Left upper quadrant pain: Secondary | ICD-10-CM | POA: Diagnosis not present

## 2015-07-04 DIAGNOSIS — M170 Bilateral primary osteoarthritis of knee: Secondary | ICD-10-CM | POA: Diagnosis present

## 2015-07-04 DIAGNOSIS — Z853 Personal history of malignant neoplasm of breast: Secondary | ICD-10-CM

## 2015-07-04 DIAGNOSIS — Z79899 Other long term (current) drug therapy: Secondary | ICD-10-CM

## 2015-07-04 DIAGNOSIS — D649 Anemia, unspecified: Secondary | ICD-10-CM

## 2015-07-04 DIAGNOSIS — K2971 Gastritis, unspecified, with bleeding: Secondary | ICD-10-CM | POA: Diagnosis not present

## 2015-07-04 DIAGNOSIS — Z87891 Personal history of nicotine dependence: Secondary | ICD-10-CM | POA: Diagnosis not present

## 2015-07-04 DIAGNOSIS — Z9889 Other specified postprocedural states: Secondary | ICD-10-CM | POA: Diagnosis not present

## 2015-07-04 DIAGNOSIS — K2900 Acute gastritis without bleeding: Secondary | ICD-10-CM | POA: Diagnosis not present

## 2015-07-04 DIAGNOSIS — I951 Orthostatic hypotension: Secondary | ICD-10-CM | POA: Diagnosis not present

## 2015-07-04 DIAGNOSIS — Z90710 Acquired absence of both cervix and uterus: Secondary | ICD-10-CM

## 2015-07-04 DIAGNOSIS — K5730 Diverticulosis of large intestine without perforation or abscess without bleeding: Secondary | ICD-10-CM | POA: Diagnosis not present

## 2015-07-04 DIAGNOSIS — R0602 Shortness of breath: Secondary | ICD-10-CM | POA: Diagnosis not present

## 2015-07-04 DIAGNOSIS — R195 Other fecal abnormalities: Secondary | ICD-10-CM | POA: Diagnosis not present

## 2015-07-04 DIAGNOSIS — T39395A Adverse effect of other nonsteroidal anti-inflammatory drugs [NSAID], initial encounter: Secondary | ICD-10-CM | POA: Diagnosis present

## 2015-07-04 DIAGNOSIS — K5790 Diverticulosis of intestine, part unspecified, without perforation or abscess without bleeding: Secondary | ICD-10-CM | POA: Diagnosis not present

## 2015-07-04 DIAGNOSIS — I714 Abdominal aortic aneurysm, without rupture: Secondary | ICD-10-CM | POA: Diagnosis not present

## 2015-07-04 DIAGNOSIS — E785 Hyperlipidemia, unspecified: Secondary | ICD-10-CM | POA: Diagnosis present

## 2015-07-04 DIAGNOSIS — K219 Gastro-esophageal reflux disease without esophagitis: Secondary | ICD-10-CM | POA: Diagnosis not present

## 2015-07-04 DIAGNOSIS — D6489 Other specified anemias: Secondary | ICD-10-CM | POA: Diagnosis not present

## 2015-07-04 DIAGNOSIS — D509 Iron deficiency anemia, unspecified: Secondary | ICD-10-CM | POA: Diagnosis not present

## 2015-07-04 DIAGNOSIS — I7143 Infrarenal abdominal aortic aneurysm, without rupture: Secondary | ICD-10-CM | POA: Diagnosis present

## 2015-07-04 DIAGNOSIS — R42 Dizziness and giddiness: Secondary | ICD-10-CM | POA: Diagnosis not present

## 2015-07-04 HISTORY — DX: Basal cell carcinoma of skin, unspecified: C44.91

## 2015-07-04 HISTORY — DX: Reserved for concepts with insufficient information to code with codable children: IMO0002

## 2015-07-04 HISTORY — DX: Abdominal aortic aneurysm, without rupture: I71.4

## 2015-07-04 HISTORY — DX: Personal history of other diseases of the digestive system: Z87.19

## 2015-07-04 LAB — COMPREHENSIVE METABOLIC PANEL
ALT: 11 U/L — ABNORMAL LOW (ref 14–54)
ANION GAP: 4 — AB (ref 5–15)
AST: 15 U/L (ref 15–41)
Albumin: 2.9 g/dL — ABNORMAL LOW (ref 3.5–5.0)
Alkaline Phosphatase: 42 U/L (ref 38–126)
BILIRUBIN TOTAL: 0.3 mg/dL (ref 0.3–1.2)
BUN: 66 mg/dL — ABNORMAL HIGH (ref 6–20)
CALCIUM: 8.4 mg/dL — AB (ref 8.9–10.3)
CO2: 24 mmol/L (ref 22–32)
Chloride: 111 mmol/L (ref 101–111)
Creatinine, Ser: 0.9 mg/dL (ref 0.44–1.00)
GFR calc Af Amer: >60 mL/min (ref >60)
GFR, EST NON AFRICAN AMERICAN: 59 mL/min — AB (ref >60)
Glucose, Bld: 124 mg/dL — ABNORMAL HIGH (ref 65–99)
POTASSIUM: 4.5 mmol/L (ref 3.5–5.1)
Sodium: 139 mmol/L (ref 135–145)
TOTAL PROTEIN: 5.3 g/dL — AB (ref 6.5–8.1)

## 2015-07-04 LAB — CBC WITH DIFFERENTIAL/PLATELET
Basophils Absolute: 0.1 10*3/uL (ref 0.0–0.1)
Basophils Relative: 1 %
Eosinophils Absolute: 0.1 10*3/uL (ref 0.0–0.7)
Eosinophils Relative: 2 %
HEMATOCRIT: 23.1 % — AB (ref 36.0–46.0)
Hemoglobin: 7.5 g/dL — ABNORMAL LOW (ref 12.0–15.0)
LYMPHS ABS: 1.5 10*3/uL (ref 0.7–4.0)
LYMPHS PCT: 16 %
MCH: 31.6 pg (ref 26.0–34.0)
MCHC: 32.5 g/dL (ref 30.0–36.0)
MCV: 97.5 fL (ref 78.0–100.0)
MONO ABS: 0.7 10*3/uL (ref 0.1–1.0)
MONOS PCT: 7 %
NEUTROS ABS: 7.1 10*3/uL (ref 1.7–7.7)
Neutrophils Relative %: 74 %
Platelets: 212 10*3/uL (ref 150–400)
RBC: 2.37 MIL/uL — ABNORMAL LOW (ref 3.87–5.11)
RDW: 12.7 % (ref 11.5–15.5)
WBC: 9.5 10*3/uL (ref 4.0–10.5)

## 2015-07-04 LAB — LACTATE DEHYDROGENASE: LDH: 131 U/L (ref 98–192)

## 2015-07-04 LAB — FERRITIN: Ferritin: 35 ng/mL (ref 11–307)

## 2015-07-04 LAB — I-STAT TROPONIN, ED: TROPONIN I, POC: 0 ng/mL (ref 0.00–0.08)

## 2015-07-04 LAB — IRON AND TIBC
Iron: 179 ug/dL — ABNORMAL HIGH (ref 28–170)
Saturation Ratios: 72 % — ABNORMAL HIGH (ref 10.4–31.8)
TIBC: 249 ug/dL — ABNORMAL LOW (ref 250–450)
UIBC: 70 ug/dL

## 2015-07-04 LAB — RETICULOCYTES
RBC.: 2.36 MIL/uL — ABNORMAL LOW (ref 3.87–5.11)
RETIC CT PCT: 1.6 % (ref 0.4–3.1)
Retic Count, Absolute: 37.8 10*3/uL (ref 19.0–186.0)

## 2015-07-04 LAB — BRAIN NATRIURETIC PEPTIDE: B NATRIURETIC PEPTIDE 5: 10.6 pg/mL (ref 0.0–100.0)

## 2015-07-04 LAB — DIRECT ANTIGLOBULIN TEST (NOT AT ARMC)
DAT, IgG: NEGATIVE
DAT, complement: NEGATIVE

## 2015-07-04 LAB — PREPARE RBC (CROSSMATCH)

## 2015-07-04 LAB — SAVE SMEAR

## 2015-07-04 LAB — ABO/RH: ABO/RH(D): B POS

## 2015-07-04 LAB — POC OCCULT BLOOD, ED: Fecal Occult Bld: POSITIVE — AB

## 2015-07-04 MED ORDER — SODIUM CHLORIDE 0.9 % IV BOLUS (SEPSIS): 1000.0000 mL | Freq: Once | INTRAVENOUS | Status: DC

## 2015-07-04 MED ORDER — DICLOFENAC SODIUM 1 % TD GEL
2.0000 g | Freq: Four times a day (QID) | TRANSDERMAL | Status: DC
  Administered 2015-07-04 – 2015-07-08 (×9): 2 g via TOPICAL
  Filled 2015-07-04 (×3): qty 100

## 2015-07-04 MED ORDER — PANTOPRAZOLE SODIUM 40 MG IV SOLR: 40.0000 mg | Freq: Two times a day (BID) | INTRAVENOUS | Status: DC

## 2015-07-04 MED ORDER — SODIUM CHLORIDE 0.9 % IV SOLN
INTRAVENOUS | Status: AC
  Administered 2015-07-04: 17:00:00 via INTRAVENOUS

## 2015-07-04 MED ORDER — PANTOPRAZOLE SODIUM 40 MG PO TBEC
40.0000 mg | DELAYED_RELEASE_TABLET | Freq: Two times a day (BID) | ORAL | Status: DC
  Administered 2015-07-04 – 2015-07-08 (×8): 40 mg via ORAL
  Filled 2015-07-04 (×9): qty 1

## 2015-07-04 MED ORDER — SODIUM CHLORIDE 0.9 % IV SOLN: Freq: Once | INTRAVENOUS | Status: DC

## 2015-07-04 MED ORDER — PANTOPRAZOLE SODIUM 40 MG IV SOLR
40.0000 mg | Freq: Every day | INTRAVENOUS | Status: DC
  Administered 2015-07-04: 40 mg via INTRAVENOUS
  Filled 2015-07-04: qty 40

## 2015-07-04 NOTE — Progress Notes (Signed)
Patient trasfered from ED to 5W06 via stretcher; alert and oriented x 4; no complaints of pain; IV saline locked in LFA  running fluids; skin intact. Orient patient to room and unit; gave patient care guide; instructed how to use the call bell and  fall risk precautions. Will continue to monitor the patient.

## 2015-07-04 NOTE — Consult Note (Addendum)
Referring Provider: Dr. Beryle Beams Primary Care Physician:  Beatrice Lecher, MD Primary Gastroenterologist:  Dr. Cristina Gong  Reason for Consultation:  Anemia; Heme positive stool  HPI: Michelle [name] is a 82 y.o. female seen for a consult due to symptomatic anemia. Has been having 2 days of fatigue, dizziness, and dyspnea on exertion. Denies melena, hematochezia, hematemesis, nausea, or vomiting. Has been having a "burning" sensation in her LUQ and epigastric area that started when she started Meloxicam two weeks ago. Was taking Meloxicam for arthritis and was taking it BID (states took the evening dose on an empty stomach). LUQ pain has occurred intermittently for years. Denies chest pain. Also, on Aspirin 325 mg/day and Plavix. Chronic history of GERD and has been on Prevacid for years until 2 weeks ago. Restarted the Prevacid 3 days ago. History of a large hiatal hernia on an EGD in 2008. Three adenomatous polyps removed on a colonoscopy in 2014. Large tubulovillous adenoma removed from proximal ascending colon in 2008. Hgb 7.5 (13.1 in April 2017). Elevated BUN with normal Cr. SBP 100's-120's on presentation. Family at bedside (husband, daughter, and son-in-law).    Past Medical History  Diagnosis Date  . GERD (gastroesophageal reflux disease)   . Hyperlipidemia   . Shingles 2008  . Hypertension 2012  . History of cerebrovascular accident 2009  . Hiatal hernia 2008  . Bradycardia   . Polyp of colon   . Cataract   . Macular degeneration   . Vocal cord polyp   . Stroke ([place]) 09/2007    Slight memory problems residual  . Tendonitis     rt hand  . History of breast cancer   . Breast cancer ([place]) 02/2011    US-guided biopsy  . Endometrial cancer ([place]) 05/2013    uterine ca  . Squamous cell carcinoma ([place])     2010-2015  . AAA (abdominal aortic aneurysm) ([place]) 10/2013  . Basal cell carcinoma 2011    R back    Past Surgical History  Procedure Laterality Date  . Cholecystectomy   2004  . Biospy  10/28/09    Shave Biospy skin Left hand(Acitinic Keratoses), Right Upper Arm (superficial Basal Cell), Right Upper Back(Superficial Basal Cell), Left Neck ( Solar Lentigo & Seborrheic Keratoses)  . Vocal cord poylps  1969    excision  . Tonsillectomy    . Colonoscopy    . Breast mammosite  03/14/2011    Procedure: MAMMOSITE BREAST;  Surgeon: Rolm Bookbinder, MD;  Location: [facility];  Service: General;  Laterality: Right;  . Breast biopsy  02/11/11    Right Breast Needle Core Biospy - Upper Outer Quadrant; ER/PR 100%, Her-2 Neu neg.; Ki-67 10%  . Breast surgery      right breast lumpectomy snbx  . Basal cell carcinoma excision    . Squamous cell carcinoma excision    . Colonoscopy w/ polypectomy      2008, 2014 (benign)  . Tear duct probing    . Dilation and curettage of uterus N/A 04/24/2013    Procedure: DILATATION AND CURETTAGE;  Surgeon: Guss Bunde, MD;  Location: [facility];  Service: Gynecology;  Laterality: N/A;  . Hysteroscopy N/A 04/24/2013    Procedure: HYSTEROSCOPY;  Surgeon: Guss Bunde, MD;  Location: [facility];  Service: Gynecology;  Laterality: N/A;  . Robotic assisted total hysterectomy with bilateral salpingo oopherectomy Bilateral 06/11/2013    Procedure: ROBOTIC ASSISTED TOTAL HYSTERECTOMY WITH BILATERAL SALPINGO OOPHORECTOMY WITH POSSIBLE STAGING, EPISIOTOMY;  Surgeon: Janie Morning, MD;  Location: [facility];  Service: Gynecology;  Laterality: Bilateral;    Prior to Admission medications   Medication Sig Start Date End Date Taking? Authorizing Provider  acetaminophen (TYLENOL) 325 MG tablet Take 650 mg by mouth as needed for mild pain.    Yes Historical Provider, MD  alendronate (FOSAMAX) 70 MG tablet Take 1 tablet (70 mg total) by mouth every 7 (seven) days. Take with a full glass of water on an empty stomach. 11/20/14  Yes Hali Marry, MD  AMBULATORY NON FORMULARY MEDICATION Medication Name: Moderate grade compression stocking  to just below the knee.  Dx is venous stasis. 20-72mgHg. 11/04/13  Yes CHali Marry MD  aspirin 325 MG tablet Take 325 mg by mouth at bedtime.     Yes Historical Provider, MD  atorvastatin (LIPITOR) 40 MG tablet Take 20 mg by mouth every other day.    Yes Historical Provider, MD  cholecalciferol (VITAMIN D) 1000 UNITS tablet Take 1,000 Units by mouth daily.   Yes Historical Provider, MD  clopidogrel (PLAVIX) 75 MG tablet TAKE 1 TABLET BY MOUTH EVERY DAY 04/27/15  Yes CHali Marry MD  fluticasone ([facility] 50 MCG/ACT nasal spray SPRITZ 2 SPRAYS INTO EACH NOSTRIL DAILY 10/15/14  Yes CHali Marry MD  lansoprazole (PREVACID) 15 MG capsule Take 30 mg by mouth daily at 12 noon. Reported on 07/01/2015   Yes Historical Provider, MD  losartan (COZAAR) 25 MG tablet TAKE 1 TABLET (25 MG TOTAL) BY MOUTH DAILY. 11/03/14  Yes CHali Marry MD  meloxicam (MOBIC) 15 MG tablet One tab PO qAM with breakfast for 2 weeks, then daily prn pain. 06/22/15  Yes TSilverio Decamp MD  Multiple Vitamins-Minerals (ICAPS AREDS 2 PO) Take by mouth.   Yes Historical Provider, MD  VIACTIV [phone]-UNT-MCG CHEW CHEW 1 TABLET BY MOUTH EVERY MORNING. 09/16/14  Yes Historical Provider, MD    Scheduled Meds: . diclofenac sodium  2 g Topical QID  . pantoprazole  40 mg Oral BID   Continuous Infusions: . sodium chloride    . sodium chloride    . sodium chloride     PRN Meds:.  Allergies as of 07/04/2015 - Review Complete 07/04/2015  Allergen Reaction Noted  . Benadryl [diphenhydramine hcl]  02/21/2011  . Doxycycline Other (See Comments) 09/24/2007  . Simvastatin Other (See Comments) 09/24/2007  . Lisinopril Other (See Comments) 04/19/2012    Family History  Problem Relation Age of Onset  . Cancer Sister 636   Lymphoma  . Cancer Paternal Aunt     Breast cancer  . Cancer Daughter 556   Bilateral Breast Cancer  . Cancer Daughter     Breast Cancer  . Cancer Sister     2005 -  Renal Cell Cancer and Breast Cancer  . Heart disease Father     Social History   Social History  . Marital Status: Married    Spouse Name: N/A  . Number of Children: N/A  . Years of Education: N/A   Occupational History  . retired    Social History Main Topics  . Smoking status: Former Smoker -- 1.00 packs/day for 30 years    Quit date: 02/01/1996  . Smokeless tobacco: Never Used  . Alcohol Use: 0.6 oz/week    1 Glasses of wine per week     Comment: Ocassionaly once a year .  .Marland KitchenDrug Use: No  . Sexual Activity:    Partners: Male     Comment: Menarche  age 79, menopause age 81, HRT x 1year   Other Topics Concern  . Not on file   Social History Narrative    Review of Systems: All negative except as stated above in HPI.  Physical Exam: Vital signs: Filed Vitals:   07/04/15 1505 07/04/15 1533  BP: 108/74 118/66  Pulse: 93 89  Temp: 98.4 F (36.9 C) 98.4 F (36.9 C)  Resp: 18 16     General:  Elderly, pale, Well-developed, well-nourished, pleasant and cooperative in NAD Head: atraumatic Eyes: anicteric sclera ENT: oropharynx clear Neck: supple, nontender Lungs:  Clear throughout to auscultation.   No wheezes, crackles, or rhonchi. No acute distress. Heart:  Regular rate and rhythm; no murmurs, clicks, rubs,  or gallops. Abdomen: LUQ tenderness with guarding, soft, nondistended, +BS, obese Rectal:  Deferred Ext: no edema  GI:  Lab Results:  Recent Labs  07/04/15 1029  WBC 9.5  HGB 7.5*  HCT 23.1*  PLT 212   BMET  Recent Labs  07/04/15 1029  NA 139  K 4.5  CL 111  CO2 24  GLUCOSE 124*  BUN 66*  CREATININE 0.90  CALCIUM 8.4*   LFT  Recent Labs  07/04/15 1029  PROT 5.3*  ALBUMIN 2.9*  AST 15  ALT 11*  ALKPHOS 42  BILITOT 0.3   PT/INR No results for input(s): LABPROT, INR in the last 72 hours.   Studies/Results: Dg Chest 2 View  07/04/2015  CLINICAL DATA:  Shortness of breath beginning yesterday. EXAM: CHEST  2 VIEW COMPARISON:   05/30/2013 FINDINGS: Diffuse interstitial prominence throughout the lungs, likely chronic interstitial lung disease. Heart is normal size. No confluent opacities or effusions. No acute bony abnormality. IMPRESSION: Chronic interstitial lung changes.  No active disease. Electronically Signed   By: Rolm Baptise M.D.   On: 07/04/2015 11:06    Impression/Plan: 82 yo with severe symptomatic anemia in the setting of NSAIDs concerning for a peptic ulcer source. No overt bleeding to suggest an ongoing active bleed. PPI PO BID. Clear liquid diet. Blood transfusion and IVFs. EGD with Propofol on 07/06/15 AM. If stable will give full liquids tomorrow and then NPO p MN on Sunday night for EGD. Hold Aspirin and Plavix. Answered patient's and family questions.    LOS: 0 days   [place] C.  07/04/2015, 4:08 PM  Pager [phone]  If no answer or after 5 PM call [phone]

## 2015-07-04 NOTE — H&P (Signed)
Date: 07/04/2015               Patient Name:  Michelle [name] MRN: [id number]  DOB: 06-17-1932 Age / Sex: 82 y.o., female   PCP: Hali Marry, MD         Medical Service: Internal Medicine Teaching Service         Attending Physician: Dr. Annia Belt, MD    First Contact: Dr. Lovena Le Pager: [phone]  Second Contact: Dr. Posey Pronto Pager: [phone]       After Hours (After 5p/  First Contact Pager: [phone]  weekends / holidays): Second Contact Pager: [phone]   Chief Complaint: dizziness, fatigue, DOE  History of Present Illness: Ms. Lull is an 82 yo female with hiatal hernia, OA, HTN, HLD, 3 cm AAA, and h/o CVA, presenting with 2 day h/o dizziness, fatigue, and DOE.  She states she noticed it yesterday when she became SOB while walking in the grocery store.  She would also become acutely dizzy after standing.  She has never had symptoms like this before.  She endorses LUQ/epigastric pain after starting Meloxicam 2 weeks ago for knee OA.  She reports initially taking 15 mg daily, but was then told to split the pill into 7.30m BID.  After starting, she noticed worsening "burning" and pain similar to her previous episodes of GERD.  She had also been on Lansaprazole for the GERD, but stopped it two weeks ago at the direction of her physician.  After she stopped it, she noticed worsening GERD and restarted it herself 3 days ago.  She endorses cough and nausea, without vomiting.  She denies changes in bowel habits, hematochezia, or melena.  She has a h/o moderately large hiatal hernia seen on EGD in 2008 without signs of reflux or ulcer.  She had a colonoscopy in 2014 showing diverticulosis and polyps.  She has a h/o CVA for which she is currently on ASA 325 mg and Plavix daily.  She denies h/o MI.  She has a h/o infrarenal AAA, stable at 3 cm when last measured in 2015.  3 year repeat [place] recommended at that time.  In the ED, her BP 100-120/60-70.  Hgb was found to have fallen from 13 in  April to 7.5.  Patient was symptomatically dizzy when sitting up.  FOBT was positive.  She was given 1 U pRBCs.  Meds: Current Facility-Administered Medications  Medication Dose Route Frequency Provider Last Rate Last Dose  . 0.9 %  sodium chloride infusion   Intravenous Once DDeno Etienne DO      . 0.9 %  sodium chloride infusion   Intravenous Continuous Rushil PSherrye Payor MD      . diclofenac sodium (VOLTAREN) 1 % transdermal gel 2 g  2 g Topical QID Rushil Patel V, MD      . pantoprazole (PROTONIX) injection 40 mg  40 mg Intravenous Daily DDeno Etienne DO   40 mg at 07/04/15 1328   Current Outpatient Prescriptions  Medication Sig Dispense Refill  . acetaminophen (TYLENOL) 325 MG tablet Take 650 mg by mouth as needed for mild pain.     .Marland Kitchenalendronate (FOSAMAX) 70 MG tablet Take 1 tablet (70 mg total) by mouth every 7 (seven) days. Take with a full glass of water on an empty stomach. 4 tablet 11  . AMBULATORY NON FORMULARY MEDICATION Medication Name: Moderate grade compression stocking to just below the knee.  Dx is venous stasis. 20-358mHg. 2 Units PRN  .  aspirin 325 MG tablet Take 325 mg by mouth at bedtime.      Marland Kitchen atorvastatin (LIPITOR) 40 MG tablet Take 20 mg by mouth every other day.     . cholecalciferol (VITAMIN D) 1000 UNITS tablet Take 1,000 Units by mouth daily.    . clopidogrel (PLAVIX) 75 MG tablet TAKE 1 TABLET BY MOUTH EVERY DAY 90 tablet 2  . fluticasone (FLONASE) 50 MCG/ACT nasal spray SPRITZ 2 SPRAYS INTO EACH NOSTRIL DAILY 16 g 2  . lansoprazole (PREVACID) 15 MG capsule Take 30 mg by mouth daily at 12 noon. Reported on 07/01/2015    . losartan (COZAAR) 25 MG tablet TAKE 1 TABLET (25 MG TOTAL) BY MOUTH DAILY. 90 tablet 3  . meloxicam (MOBIC) 15 MG tablet One tab PO qAM with breakfast for 2 weeks, then daily prn pain. 30 tablet 3  . Multiple Vitamins-Minerals (ICAPS AREDS 2 PO) Take by mouth.    Marland Kitchen VIACTIV [phone] MG-UNT-MCG CHEW CHEW 1 TABLET BY MOUTH EVERY MORNING.  12     Allergies: Allergies as of 07/04/2015 - Review Complete 07/04/2015  Allergen Reaction Noted  . Benadryl [diphenhydramine hcl]  02/21/2011  . Doxycycline Other (See Comments) 09/24/2007  . Simvastatin Other (See Comments) 09/24/2007  . Lisinopril Other (See Comments) 04/19/2012   Past Medical History  Diagnosis Date  . GERD (gastroesophageal reflux disease)   . Hyperlipidemia   . Shingles 2008  . Hypertension 2012  . History of cerebrovascular accident 2009  . Hiatal hernia 2008  . Bradycardia   . Polyp of colon   . Cataract   . Macular degeneration   . Vocal cord polyp   . Stroke ([place]) 09/2007    Slight memory problems residual  . Tendonitis     rt hand  . History of breast cancer   . Breast cancer ([place]) 02/2011    US-guided biopsy  . Endometrial cancer ([place]) 05/2013    uterine ca  . Squamous cell carcinoma ([place])     2010-2015  . AAA (abdominal aortic aneurysm) ([place]) 10/2013  . Basal cell carcinoma 2011    R back   Past Surgical History  Procedure Laterality Date  . Cholecystectomy  2004  . Biospy  10/28/09    Shave Biospy skin Left hand(Acitinic Keratoses), Right Upper Arm (superficial Basal Cell), Right Upper Back(Superficial Basal Cell), Left Neck ( Solar Lentigo & Seborrheic Keratoses)  . Vocal cord poylps  1969    excision  . Tonsillectomy    . Colonoscopy    . Breast mammosite  03/14/2011    Procedure: MAMMOSITE BREAST;  Surgeon: Rolm Bookbinder, MD;  Location: [facility];  Service: General;  Laterality: Right;  . Breast biopsy  02/11/11    Right Breast Needle Core Biospy - Upper Outer Quadrant; ER/PR 100%, Her-2 Neu neg.; Ki-67 10%  . Breast surgery      right breast lumpectomy snbx  . Basal cell carcinoma excision    . Squamous cell carcinoma excision    . Colonoscopy w/ polypectomy      2008, 2014 (benign)  . Tear duct probing    . Dilation and curettage of uterus N/A 04/24/2013    Procedure: DILATATION AND CURETTAGE;  Surgeon: Guss Bunde, MD;  Location: [facility];  Service: Gynecology;  Laterality: N/A;  . Hysteroscopy N/A 04/24/2013    Procedure: HYSTEROSCOPY;  Surgeon: Guss Bunde, MD;  Location: [facility];  Service: Gynecology;  Laterality: N/A;  . Robotic assisted total hysterectomy  with bilateral salpingo oopherectomy Bilateral 06/11/2013    Procedure: ROBOTIC ASSISTED TOTAL HYSTERECTOMY WITH BILATERAL SALPINGO OOPHORECTOMY WITH POSSIBLE STAGING, EPISIOTOMY;  Surgeon: Janie Morning, MD;  Location: [facility];  Service: Gynecology;  Laterality: Bilateral;   Family History  Problem Relation Age of Onset  . Cancer Sister 92    Lymphoma  . Cancer Paternal Aunt     Breast cancer  . Cancer Daughter 78    Bilateral Breast Cancer  . Cancer Daughter     Breast Cancer  . Cancer Sister     2005 - Renal Cell Cancer and Breast Cancer  . Heart disease Father    Social History   Social History  . Marital Status: Married    Spouse Name: N/A  . Number of Children: N/A  . Years of Education: N/A   Occupational History  . retired    Social History Main Topics  . Smoking status: Former Smoker -- 1.00 packs/day for 30 years    Quit date: 02/01/1996  . Smokeless tobacco: Never Used  . Alcohol Use: 0.6 oz/week    1 Glasses of wine per week     Comment: Ocassionaly once a year .  Marland Kitchen Drug Use: No  . Sexual Activity:    Partners: Male     Comment: Menarche age 80, menopause age 26, HRT x 1year   Other Topics Concern  . Not on file   Social History Narrative    Review of Systems: Pertinent items are noted in HPI. She otherwise denies CP, palpitations, fevers, chills, weight loss or gain, or weakness.  She endorses intermittent tingling and decreased sensation on the bottom of her feet.  Physical Exam: Blood pressure 106/64, pulse 100, temperature 98.6 F (37 C), temperature source Oral, resp. rate 16, height 5' 7"  (1.702 m), weight 180 lb (81.647 kg), SpO2 100 %. Physical Exam  Constitutional: She is oriented to  person, place, and time and well-developed, well-nourished, and in no distress. No distress.  HENT:  Head: Normocephalic and atraumatic.  Mouth/Throat: Oropharynx is clear and moist.  Eyes: EOM are normal. No scleral icterus.  Pale conjuctiva.  Neck: No JVD present. No tracheal deviation present.  Cardiovascular:  Borderline tachycardic. Regular rhythm. No murmurs or gallops appreciated.  Delayed capillary refill.  DP pulses 1+ and symmetric.  Pulmonary/Chest: Effort normal and breath sounds normal. No stridor. No respiratory distress. She has no wheezes. She has no rales.  Abdominal: Soft. She exhibits no distension. There is no rebound and no guarding.  Minimally painful to deep palpation in LUQ/epigastrum.  No Cullen or Grey Turner signs.  Musculoskeletal: She exhibits no edema.  Neurological: She is alert and oriented to person, place, and time.  Skin: Skin is warm and dry. She is not diaphoretic. There is pallor.     Lab results: Basic Metabolic Panel:  Recent Labs  07/04/15 1029  NA 139  K 4.5  CL 111  CO2 24  GLUCOSE 124*  BUN 66*  CREATININE 0.90  CALCIUM 8.4*   Liver Function Tests:  Recent Labs  07/04/15 1029  AST 15  ALT 11*  ALKPHOS 42  BILITOT 0.3  PROT 5.3*  ALBUMIN 2.9*   No results for input(s): LIPASE, AMYLASE in the last 72 hours. No results for input(s): AMMONIA in the last 72 hours. CBC:  Recent Labs  07/04/15 1029  WBC 9.5  NEUTROABS 7.1  HGB 7.5*  HCT 23.1*  MCV 97.5  PLT 212   Cardiac Enzymes: No results  for input(s): CKTOTAL, CKMB, CKMBINDEX, TROPONINI in the last 72 hours. BNP: No results for input(s): PROBNP in the last 72 hours. D-Dimer: No results for input(s): DDIMER in the last 72 hours. CBG: No results for input(s): GLUCAP in the last 72 hours. Hemoglobin A1C: No results for input(s): HGBA1C in the last 72 hours. Fasting Lipid Panel: No results for input(s): CHOL, HDL, LDLCALC, TRIG, CHOLHDL, LDLDIRECT in the last 72  hours. Thyroid Function Tests: No results for input(s): TSH, T4TOTAL, FREET4, T3FREE, THYROIDAB in the last 72 hours. Anemia Panel: No results for input(s): VITAMINB12, FOLATE, FERRITIN, TIBC, IRON, RETICCTPCT in the last 72 hours. Coagulation: No results for input(s): LABPROT, INR in the last 72 hours. Urine Drug Screen: Drugs of Abuse  No results found for: LABOPIA, COCAINSCRNUR, LABBENZ, AMPHETMU, THCU, LABBARB  Alcohol Level: No results for input(s): ETH in the last 72 hours. Urinalysis: No results for input(s): COLORURINE, LABSPEC, PHURINE, GLUCOSEU, HGBUR, BILIRUBINUR, KETONESUR, PROTEINUR, UROBILINOGEN, NITRITE, LEUKOCYTESUR in the last 72 hours.  Invalid input(s): APPERANCEUR Misc. Labs:   Imaging results:  Dg Chest 2 View  07/04/2015  CLINICAL DATA:  Shortness of breath beginning yesterday. EXAM: CHEST  2 VIEW COMPARISON:  05/30/2013 FINDINGS: Diffuse interstitial prominence throughout the lungs, likely chronic interstitial lung disease. Heart is normal size. No confluent opacities or effusions. No acute bony abnormality. IMPRESSION: Chronic interstitial lung changes.  No active disease. Electronically Signed   By: Rolm Baptise M.D.   On: 07/04/2015 11:06    Other results: EKG: normal sinus rhythm, LAD.  Assessment & Plan by Problem: Active Problems:   Upper GI bleed  Ms. Hefty is an 82 yo female with hiatal hernia, OA, HTN, HLD, 3 cm AAA, and h/o CVA, presenting with 2 day h/o dizziness, fatigue, and DOE.   Symptomatic Anemia 2/2 UGI Bleed/PUD: Patient with h/o hiatal hernia and GERD on ASA 31m and Plavix presents with 6g hemoglobin, orthostasis, fatigue, DOE, and positive FOBT after starting Meloxicam 15 mg daily and stopping Lansaprazole therapy.  Etiology most likely PUD 2/2 high dose NSAID in the setting of hiatal hernia.  Ddx includes ruptured AAA, but no apparent bruising, patient is hemodynamically stable, and aneurysm is far below cutoff worrisome for spontaneous  rupture.  Therefore, will not pursue repeat imaging unless EGD is negative.  No h/o hemolytic disease or evidence of decreased production.  Will consult GI and f/u rec's. - GI consult, appreciate rec's [ ]  Fe, ferritin, retic, LDH, smear - Transfuse 1 U pRBCs - NS 1L bolus and 100 ml/hr x12 hours - Protonix 40 mg PO BID - Clear liquid diet - HOLD ASA/Plavix - STOP Meloxicam - Teley - PT - Transfuse Hgb <8 2/2 symptomatic anemia  HTN: Patient orthostatic on presentation, but otherwise hemodynamically stable.  Will hold BP meds and monitor. - HOLD Losartan  Knee OA: Patient with b/l knee pain 2/2 OA for which she was prescribed Meloxicam.  Patient understands role of NSAIDs in PUD and is agreeable to stopping therapy. - Voltaren gel - Tylenol  HLD: Atorvastatin 40 mg  FEN/GI: - NS 100 cc/hr - Clears  DVT Ppx: SCDs  Dispo: Disposition is deferred at this time, awaiting improvement of current medical problems. Anticipated discharge in approximately 2-3 day(s).   The patient does have a current PCP (Hali Marry MD) and [facility].  The patient does not have transportation limitations that hinder transportation to clinic appointments.  Signed: NIline Oven MD,  PhD 07/04/2015, 2:30 PM

## 2015-07-04 NOTE — ED Notes (Signed)
Pt reports that starting on Thursday evening she did not sleep well due to Knee pain from OA. And felt weak and had SHOB when standing. Pt has been taking meloxicam for 10 days.

## 2015-07-04 NOTE — H&P (Signed)
Date: 07/04/2015               Patient Name:  Michelle [name] MRN: [id number]  DOB: 1932-10-22 Age / Sex: 82 y.o., female   PCP: Hali Marry, MD              Medical Service: Internal Medicine Teaching Service              Attending Physician: Dr. Annia Belt, MD    First Contact: Roselyn Meier, Michelle 3 Pager: [phone]  Second Contact: Dr. Lindon Romp Pager: [phone]  Third Contact Dr. Charlott Rakes Pager: [phone]       After Hours (After 5p/  First Contact Pager: [phone]  weekends / holidays): Second Contact Pager: [phone]   Chief Complaint: shortness of breath, dizziness  History of Present Illness: Michelle [name] is an 82 year-old female with a history of GERD, hiatal hernia, HTN, HLD, CVA, breast cancer s/p right lumpectomy in 2013 in remission, endometrial cancer s/p total hysterectomy (2015) in remission, and 3 cm AAA who presents with new onset shortness of breath and dizziness. Her family is at the bedside and helps provide additional history.  Patient was in her usual state of health until yesterday when she noticed shortness of breath with exertion and light headedness while grocery shopping with her husband.   According to her husband, she went home and immediately laid down in bed and fell asleep.  She denies loss of consciousness.  Her symptoms improved after laying down.  Since this episode, she describes increased fatigue and notices continued dizziness and shortness of breath when she stands up to walk.  At baseline she is able to [place] without any shortness of breath and perform all ADLs.  She denies chest pain but does endorse one brief episode of palpitations yesterday.    Patient reports burning LUQ and epigastric pain intermittently for the last two weeks.  She claims the pain is similar to what she typically feels with her reflux.  Two weeks ago she was weaned off of her lansoprazole which she has taken for >20 years due to unknown reasons.  A  few days later she was started on meloxicam 15 mg daily for knee OA.  Patient claims her abdominal pain begins after taking the meloxicam every morning but eventually resolves on its own.  Patient restarted the lansoprazole 2 or 3 days ago for the pain with some relief.  She has tried taking it with food but states the pain still occurs.  She denies melena, hematochezia, diarrhea, and constipation.  She endorse mild nausea over the last few days but denies vomiting.  Also denies fevers, chills, and weight loss.    Her last colonoscopy was in 2014 which was significant for 3 sessile polyps that were found to be tubular adenoma (x1) and serrated adenoma (x2) on pathology.  No high grade dysplasia or malignancy was found.  Her last EGD was in 2008 which showed a large hiatal hernia without erosions or ulcerations.    Her workup in the ED was significant for a positive FOBT and a Hgb of 7.5, down from 13.1 in April 2017.    Meds: Current Facility-Administered Medications  Medication Dose Route Frequency Provider Last Rate Last Dose  . 0.9 %  sodium chloride infusion   Intravenous Once Deno Etienne, DO      . 0.9 %  sodium chloride infusion   Intravenous Continuous Rushil Sherrye Payor, MD      .  diclofenac sodium (VOLTAREN) 1 % transdermal gel 2 g  2 g Topical QID Rushil Patel V, MD      . pantoprazole (PROTONIX) injection 40 mg  40 mg Intravenous Daily Deno Etienne, DO   40 mg at 07/04/15 1328   Current Outpatient Prescriptions  Medication Sig Dispense Refill  . acetaminophen (TYLENOL) 325 MG tablet Take 650 mg by mouth as needed for mild pain.     Marland Kitchen alendronate (FOSAMAX) 70 MG tablet Take 1 tablet (70 mg total) by mouth every 7 (seven) days. Take with a full glass of water on an empty stomach. 4 tablet 11  . AMBULATORY NON FORMULARY MEDICATION Medication Name: Moderate grade compression stocking to just below the knee.  Dx is venous stasis. 20-81mgHg. 2 Units PRN  . aspirin 325 MG tablet Take 325 mg by mouth  at bedtime.      .Marland Kitchenatorvastatin (LIPITOR) 40 MG tablet Take 20 mg by mouth every other day.     . cholecalciferol (VITAMIN D) 1000 UNITS tablet Take 1,000 Units by mouth daily.    . clopidogrel (PLAVIX) 75 MG tablet TAKE 1 TABLET BY MOUTH EVERY DAY 90 tablet 2  . fluticasone (FLONASE) 50 MCG/ACT nasal spray SPRITZ 2 SPRAYS INTO EACH NOSTRIL DAILY 16 g 2  . lansoprazole (PREVACID) 15 MG capsule Take 30 mg by mouth daily at 12 noon. Reported on 07/01/2015    . losartan (COZAAR) 25 MG tablet TAKE 1 TABLET (25 MG TOTAL) BY MOUTH DAILY. 90 tablet 3  . meloxicam (MOBIC) 15 MG tablet One tab PO qAM with breakfast for 2 weeks, then daily prn pain. 30 tablet 3  . Multiple Vitamins-Minerals (ICAPS AREDS 2 PO) Take by mouth.    .Marland KitchenVIACTIV 5916-606-00MG-UNT-MCG CHEW CHEW 1 TABLET BY MOUTH EVERY MORNING.  12    Allergies: Allergies as of 07/04/2015 - Review Complete 07/04/2015  Allergen Reaction Noted  . Benadryl [diphenhydramine hcl]  02/21/2011  . Doxycycline Other (See Comments) 09/24/2007  . Simvastatin Other (See Comments) 09/24/2007  . Lisinopril Other (See Comments) 04/19/2012   Past Medical History  Diagnosis Date  . GERD (gastroesophageal reflux disease)   . Hyperlipidemia   . Shingles 2008  . Hypertension 2012  . History of cerebrovascular accident 2009  . Hiatal hernia 2008  . Bradycardia   . Polyp of colon   . Cataract   . Macular degeneration   . Vocal cord polyp   . [place]    Slight memory problems residual  . Tendonitis     rt hand  . History of breast cancer   . Breast [place]    US-guided biopsy  . [place]ometrial cancer [place] 05/[place]    uterine ca  . Squamous cell carcinoma ([facility]     2010-2015  . AAA (abdominal aortic aneurysm) ([facility] 10/2013  . Basal cell carcinoma 2011    R back   Past Surgical History  Procedure Laterality Date  . Cholecystectomy  2004  . Biospy  10/28/09    Shave Biospy skin Left hand(Acitinic Keratoses), Right Upper Arm  (superficial Basal Cell), Right Upper Back(Superficial Basal Cell), Left Neck ( Solar Lentigo & Seborrheic Keratoses)  . Vocal cord poylps  1969    excision  . Tonsillectomy    . Colonoscopy    . Breast mammosite  03/14/2011    Procedure: MAMMOSITE BREAST;  Surgeon: MRolm Bookbinder MD;  [place]  Service: General;  Laterality: Right;  . Breast  biopsy  02/11/11    Right Breast Needle Core Biospy - Upper Outer Quadrant; ER/PR 100%, Her-2 Neu neg.; Ki-67 10%  . Breast surgery      right breast lumpectomy snbx  . Basal cell carcinoma excision    . Squamous cell carcinoma excision    . Colonoscopy w/ polypectomy      2008, 2014 (benign)  . Tear duct probing    . Dilation and curettage of uterus N/A 04/24/2013    Procedure: DILATATION AND CURETTAGE;  Surgeon: Guss Bunde, MD;  Location: [facility];  Service: Gynecology;  Laterality: N/A;  . Hysteroscopy N/A 04/24/2013    Procedure: HYSTEROSCOPY;  Surgeon: Guss Bunde, MD;  Location: [facility];  Service: Gynecology;  Laterality: N/A;  . Robotic assisted total hysterectomy with bilateral salpingo oopherectomy Bilateral 06/11/2013    Procedure: ROBOTIC ASSISTED TOTAL HYSTERECTOMY WITH BILATERAL SALPINGO OOPHORECTOMY WITH POSSIBLE STAGING, EPISIOTOMY;  Surgeon: Janie Morning, MD;  [facility];  Service: Gynecology;  Laterality: Bilateral;   Family History  Problem Relation Age of Onset  . Cancer Sister 90    Lymphoma  . Cancer Paternal Aunt     Breast cancer  . Cancer Daughter 53    Bilateral Breast Cancer  . Cancer Daughter     Breast Cancer  . Cancer Sister     2005 - Renal Cell Cancer and Breast Cancer  . Heart disease Father    Social History   Social History  . Marital Status: Married    Spouse Name: N/A  . Number of Children: N/A  . Years of Education: N/A   Occupational History  . retired    Social History Main Topics  . Smoking status: Former Smoker -- 1.00 packs/day for 30 years     Quit date: 02/01/1996  . Smokeless tobacco: Never Used  . Alcohol Use: 0.6 oz/week    1 Glasses of wine per week     Comment: Ocassionaly once a year .  Marland Kitchen Drug Use: No  . Sexual Activity:    Partners: Male     Comment: Menarche age 61, menopause age 40, HRT x 1year   Other Topics Concern  . Not on file   Social History Narrative    Review of Systems: Constitutional: negative for fever, chills, weight changes HEENT:  Negative for vision changes CV: per HPI.  Negative for chest pain.   Lungs: per HPI.  Negative for cough GI: per HPI.  Positive for nausea.  Negative for vomiting Neuro: per HPI.  Negative for weakness, numbness, tingling, syncope  Physical Exam: Blood pressure 106/64, pulse 100, temperature 98.6 F (37 C), temperature source Oral, resp. rate 16, height 5' 7" (1.702 m), weight 81.647 kg (180 lb), SpO2 100 %. General: Pleasant, well appearing female in NAD.  Lying comfortably in bed HEENT:  Pale conjunctiva bilaterally.   CV:  RRR without murmurs.  Normal S1 and S2 Lungs:  Normal work of breathing.  CTA bilaterally without wheezes or crackles Abd: mild tenderness in the LUG and epigastric areas.  No rebound or guarding. Soft, nondistended.  No palpable masses.  Normoactive bowel sounds Ext: No peripheral edema.  DP pulses 1+ bilaterally.  Radial pulses 2+ bilaterally.  Heberden nodules present both hands  Neuro: A&O x3.  Appropriate mentation, follows commands Pysch: Appropriate mood and affect  Lab results: Recent Labs Lab 07/04/15 1029  WBC 9.5  HGB 7.5*  HCT 23.1*  PLT 212    Recent Labs Lab 07/04/15 1029  NA 139  K 4.5  CL 111  CO2 24  BUN 66*  CREATININE 0.90  CALCIUM 8.4*  PROT 5.3*  BILITOT 0.3  ALKPHOS 42  ALT 11*  AST 15  GLUCOSE 124*    Imaging results:  Dg Chest 2 View  07/04/2015  CLINICAL DATA:  Shortness of breath beginning yesterday. EXAM: CHEST  2 VIEW COMPARISON:  05/30/2013 FINDINGS: Diffuse interstitial prominence  throughout the lungs, likely chronic interstitial lung disease. Heart is normal size. No confluent opacities or effusions. No acute bony abnormality. IMPRESSION: Chronic interstitial lung changes.  No active disease. Electronically Signed   By: Rolm Baptise M.D.   On: 07/04/2015 11:06    Other results: EKG: normal sinus rhythm, no ST segment changes.  Assessment & Plan by Problem: Active Problems:   Upper GI bleed Chronic Problems   Hypertension   Knee OA   History of AAA  Michelle [name] is an 82 year-old female with a history of hiatal hernia, GERD, AAA, and history of endometrial and breast cancer who presents with new onset shortness of breath, dizziness, and fatigue.    # Anemia secondary to GI bleed: Given her long history of GERD with a hiatal hernia in the setting of discontinuing a PPI and starting meloxicam, the most likely etiology of her symptoms is from peptic ulcer disease secondary to NSAID use.  Her bleeding has likely been exacerbated by dual antilatelet therapy with aspirin 325 mg and plavix.  With her history of breast and endometrial cancer we are also concerned for colon cancer, though this is less likely without hematochezia and systemic signs like weight loss.  Diverticulitis is also on the differential given her diverticulosis on previous colonoscopy but this is less likely without lower abdominal pain, leukocytosis, and fever.  Ruptured AAA is on the differential but unlikely with stable vital signs, lack of abdominal bruising, and subacute presentation of symptoms.  Hgb in the ED was 7.5, from 13.1 in April 2017.   Currently on DAPT (aspirin 325 mg and clopidogrel 75 mg daily) for secondary prophylaxis s/p CVA.  - admit to telemetry  - Consult GI, recs appreciated.  EGD likely not until Monday - 1 unit of blood typed and crossed, transfused in the ED.  H &H post transfusion - 1 L NS bolus, then 100 mL/hr infusion for 12 hours - CBC tomorrow am - Transfuse if Hgb <7 - Hold  home aspirin and clopidogrel - Hold home meloxicam - Pantoprazole 40 mg PO BID - Clear liquid diet if emergency procedure is needed  # Hypertension:  Soft BPs in the 440N to 027O systolic and 53G diastolic.   - hold home losartan 25 mg daily  # Knee OA: Recently switched to meloxicam 15 mg daily 2 weeks ago.   - Hold meloxicam as above - Diclofenac gel QID - tylenol prn  # History of AAA: 3 cm AAA noted in 2015.  Was told to re-image in 2018.  Given stable vitals and subacute presentation ruptured AAA is unlikely.  Will not re-image unless further workup is unremarkable  # HLD - Continue home atorvastatin 20 mg every other day  This is a Careers information officer Note.  The care of the patient was discussed with Dr. Lindon Romp and the assessment and plan was formulated with their assistance.  Please see their note for official documentation of the patient encounter.   Signed: Jobie Quaker, Med Student 07/04/2015, 2:38 PM

## 2015-07-04 NOTE — ED Provider Notes (Signed)
CSN: [id number]     Arrival date & time 07/04/15  4742 History   First MD Initiated Contact with Patient 07/04/15 [phone]     Chief Complaint  Patient presents with  . Shortness of Breath     (Consider location/radiation/quality/duration/timing/severity/associated sxs/prior Treatment) Patient is a 82 y.o. female presenting with shortness of breath. The history is provided by the patient and the spouse.  Shortness of Breath Severity:  Severe Onset quality:  Gradual Duration:  1 week Timing:  Constant Progression:  Worsening Chronicity:  New Relieved by:  Nothing Worsened by:  Nothing tried Ineffective treatments:  None tried Associated symptoms: abdominal pain (mild epigastic burning) and diaphoresis   Associated symptoms: no chest pain, no fever, no headaches, no vomiting and no wheezing    82 yo F The chief complaint shortness of breath on exertion. This been going on for the past week or so. Patient had been taking meloxicam for a right knee pain. Patient states that she started having a little bit of epigastric burning and noted that she was very weak. Gets diaphoretic and short of breath with these episodes. Can barely walk to the bathroom without feeling like she is to pass out. Denies similar presentation.  Past Medical History  Diagnosis Date  . GERD (gastroesophageal reflux disease)   . Hyperlipidemia   . Shingles 2005  . Hypertension   . History of cerebrovascular accident   . Hiatal hernia 2008  . Bradycardia   . Polyp of colon   . Cataract   . Macular degeneration   . Vocal cord polyp   . Stroke ([place]) 09/2007    Slight memory problems residual  . Tendonitis     rt hand  . History of breast cancer   . Cancer ([place]) 10/28/2009    squamous cell - upper right arm  . Cancer ([place]) 10/28/09    basal cell -upper right back  . Breast cancer ([place]) 03/2011  . Endometrial cancer ([place]) 03/2013    uterine ca  . Endometrial cancer [facility])    Past Surgical History  Procedure  Laterality Date  . Cholecystectomy  2004  . Biospy  10/28/09    Shave Biospy skin Left hand(Acitinic Keratoses), Right Upper Arm (superficial Basal Cell), Right Upper Back(Superficial Basal Cell), Left Neck ( Solar Lentigo & Seborrheic Keratoses)  . Vocal cord poylps  1969    excision  . Tonsillectomy    . Colonoscopy    . Breast mammosite  03/14/2011    Procedure: MAMMOSITE BREAST;  Surgeon: Rolm Bookbinder, MD;  Location: [facility];  Service: General;  Laterality: Right;  . Breast biopsy  02/11/11    Right Breast Needle Core Biospy - Upper Outer Quadrant; ER/PR 100%, Her-2 Neu neg.; Ki-67 10%  . Breast surgery      right breast lumpectomy snbx  . Basal cell carcinoma excision    . Squamous cell carcinoma excision    . Colonoscopy w/ polypectomy    . Tear duct probing    . Dilation and curettage of uterus N/A 04/24/2013    Procedure: DILATATION AND CURETTAGE;  Surgeon: Guss Bunde, MD;  Location: [facility];  Service: Gynecology;  Laterality: N/A;  . Hysteroscopy N/A 04/24/2013    Procedure: HYSTEROSCOPY;  Surgeon: Guss Bunde, MD;  Location: [facility];  Service: Gynecology;  Laterality: N/A;  . Robotic assisted total hysterectomy with bilateral salpingo oopherectomy Bilateral 06/11/2013    Procedure: ROBOTIC ASSISTED TOTAL HYSTERECTOMY WITH BILATERAL SALPINGO OOPHORECTOMY WITH  POSSIBLE STAGING, EPISIOTOMY;  Surgeon: Janie Morning, MD;  Location[facility];  Service: Gynecology;  Laterality: Bilateral;   Family History  Problem Relation Age of Onset  . Cancer Sister 41    Lymphoma  . Cancer Paternal Aunt     Breast cancer  . Cancer Daughter 31    Bilateral Breast Cancer  . Cancer Daughter     Breast Cancer  . Cancer Sister     2005 - Renal Cell Cancer and Breast Cancer  . Heart disease Father    Social History  Substance Use Topics  . Smoking status: Former Smoker -- 1.00 packs/day for 30 years    Quit date: 02/01/1996  . Smokeless tobacco: Never Used  .  Alcohol Use: 0.6 oz/week    1 Glasses of wine per week     Comment: Ocassionaly once a year .   OB History    Gravida Para Term Preterm AB TAB SAB Ectopic Multiple Living   _0 Review of Systems  Constitutional: Positive for diaphoresis. Negative for fever and chills.  HENT: Negative for congestion and rhinorrhea.   Eyes: Negative for redness and visual disturbance.  Respiratory: Positive for shortness of breath. Negative for wheezing.   Cardiovascular: Negative for chest pain and palpitations.  Gastrointestinal: Positive for nausea and abdominal pain (mild epigastic burning). Negative for vomiting.  Genitourinary: Negative for dysuria and urgency.  Musculoskeletal: Negative for myalgias and arthralgias.  Skin: Negative for pallor and wound.  Neurological: Negative for dizziness and headaches.      Allergies  Benadryl; Doxycycline; Simvastatin; and Lisinopril  Home Medications   Prior to Admission medications   Medication Sig Start Date End Date Taking? Authorizing Provider  acetaminophen (TYLENOL) 325 MG tablet Take 650 mg by mouth as needed for mild pain.    Yes Historical Provider, MD  alendronate (FOSAMAX) 70 MG tablet Take 1 tablet (70 mg total) by mouth every 7 (seven) days. Take with a full glass of water on an empty stomach. 11/20/14  Yes Hali Marry, MD  AMBULATORY NON FORMULARY MEDICATION Medication Name: Moderate grade compression stocking to just below the knee.  Dx is venous stasis. 20-29mgHg. 11/04/13  Yes CHali Marry MD  aspirin 325 MG tablet Take 325 mg by mouth at bedtime.     Yes Historical Provider, MD  atorvastatin (LIPITOR) 40 MG tablet Take 20 mg by mouth every other day.    Yes Historical Provider, MD  cholecalciferol (VITAMIN D) 1000 UNITS tablet Take 1,000 Units by mouth daily.   Yes Historical Provider, MD  clopidogrel (PLAVIX) 75 MG tablet TAKE 1 TABLET BY MOUTH EVERY DAY 04/27/15  Yes CHali Marry MD   fluticasone ([facility] 50 MCG/ACT nasal spray SPRITZ 2 SPRAYS INTO EACH NOSTRIL DAILY 10/15/14  Yes CHali Marry MD  lansoprazole (PREVACID) 15 MG capsule Take 30 mg by mouth daily at 12 noon. Reported on 07/01/2015   Yes Historical Provider, MD  losartan (COZAAR) 25 MG tablet TAKE 1 TABLET (25 MG TOTAL) BY MOUTH DAILY. 11/03/14  Yes CHali Marry MD  meloxicam (MOBIC) 15 MG tablet One tab PO qAM with breakfast for 2 weeks, then daily prn pain. 06/22/15  Yes TSilverio Decamp MD  Multiple Vitamins-Minerals (ICAPS AREDS 2 PO) Take by mouth.   Yes Historical Provider, MD  VIACTIV 5517-616-07MG-UNT-MCG CHEW CHEW 1 TABLET BY MOUTH EVERY MORNING. 09/16/14  Yes Historical Provider, MD  BP 124/64 mmHg  Pulse 86  Temp(Src) 98.6 F (37 C) (Oral)  Resp 18  Ht _0  (1.702 m)  Wt 180 lb (81.647 kg)  BMI 28.19 kg/m2  SpO2 100% Physical Exam  Constitutional: She is oriented to person, place, and time. She appears well-developed and well-nourished. No distress.  Marked pallor  HENT:  Head: Normocephalic and atraumatic.  Eyes: EOM are normal. Pupils are equal, round, and reactive to light.  Neck: Normal range of motion. Neck supple.  Cardiovascular: Normal rate and regular rhythm.  Exam reveals no gallop and no friction rub.   No murmur heard. Pulmonary/Chest: Effort normal. She has no wheezes. She has no rales.  Abdominal: Soft. She exhibits no distension. There is no tenderness. There is no rebound and no guarding.  Musculoskeletal: She exhibits no edema or tenderness.  Neurological: She is alert and oriented to person, place, and time.  Skin: Skin is warm and dry. She is not diaphoretic.  Psychiatric: She has a normal mood and affect. Her behavior is normal.  Nursing note and vitals reviewed.   ED Course  Procedures (including critical care time) Labs Review Labs Reviewed  CBC WITH DIFFERENTIAL/PLATELET - Abnormal; Notable for the following:    RBC 2.37 (*)     Hemoglobin 7.5 (*)    HCT 23.1 (*)    All other components within normal limits  COMPREHENSIVE METABOLIC PANEL - Abnormal; Notable for the following:    Glucose, Bld 124 (*)    BUN 66 (*)    Calcium 8.4 (*)    Total Protein 5.3 (*)    Albumin 2.9 (*)    ALT 11 (*)    GFR calc non Af Amer 59 (*)    Anion gap 4 (*)    All other components within normal limits  POC OCCULT BLOOD, ED - Abnormal; Notable for the following:    Fecal Occult Bld POSITIVE (*)    All other components within normal limits  BRAIN NATRIURETIC PEPTIDE  OCCULT BLOOD X 1 CARD TO LAB, STOOL  LACTATE DEHYDROGENASE  RETICULOCYTES  SAVE SMEAR  FERRITIN  IRON AND TIBC  I-STAT TROPOININ, ED  TYPE AND SCREEN  PREPARE RBC (CROSSMATCH)  ABO/RH  DIRECT ANTIGLOBULIN TEST (NOT AT [facility])    Imaging Review Dg Chest 2 View  07/04/2015  CLINICAL DATA:  Shortness of breath beginning yesterday. EXAM: CHEST  2 VIEW COMPARISON:  05/30/2013 FINDINGS: Diffuse interstitial prominence throughout the lungs, likely chronic interstitial lung disease. Heart is normal size. No confluent opacities or effusions. No acute bony abnormality. IMPRESSION: Chronic interstitial lung changes.  No active disease. Electronically Signed   By: Rolm Baptise M.D.   On: 07/04/2015 11:06   I have personally reviewed and evaluated these images and lab results as part of my medical decision-making.   EKG Interpretation   Date/Time:  Saturday July 04 2015 09:58:03 EDT Ventricular Rate:  88 PR Interval:  132 QRS Duration: 79 QT Interval:  362 QTC Calculation: 438 R Axis:   -8 Text Interpretation:  Sinus rhythm Low voltage, precordial leads  Borderline T wave abnormalities No significant change since last tracing  Confirmed by Chrisy Hillebrand MD, DANIEL ([id number]) on 07/04/2015 10:51:15 AM      MDM   Final diagnoses:  Symptomatic anemia    82 yo F with a chief complaint of shortness breath on exertion. Patient is found to have symptomatic anemia. Has a 6 g  hemoglobin drop in the past month and a half. Will  start Protonix given a unit of blood. Admit to the hospital.  CRITICAL CARE Performed by: Cecilio Asper   Total critical care time: 35 minutes  Critical care time was exclusive of separately billable procedures and treating other patients.  Critical care was necessary to treat or prevent imminent or life-threatening deterioration.  Critical care was time spent personally by me on the following activities: development of treatment plan with patient and/or surrogate as well as nursing, discussions with consultants, evaluation of patient's response to treatment, examination of patient, obtaining history from patient or surrogate, ordering and performing treatments and interventions, ordering and review of laboratory studies, ordering and review of radiographic studies, pulse oximetry and re-evaluation of patient's condition.  IM to admit.   The patients results and plan were reviewed and discussed.   Any x-rays performed were independently reviewed by myself.   Differential diagnosis were considered with the presenting HPI.  Medications  0.9 %  sodium chloride infusion (not administered)  pantoprazole (PROTONIX) injection 40 mg (40 mg Intravenous Given 07/04/15 1328)    Filed Vitals:   07/04/15 1000 07/04/15 1003  BP: 124/64   Pulse: 86   Temp:  98.6 F (37 C)  TempSrc: Oral Oral  Resp: 18   Height: _0  (1.702 m)   Weight: 180 lb (81.647 kg)   SpO2: 100%     Final diagnoses:  Symptomatic anemia    Admission/ observation were discussed with the admitting physician, patient and/or family and they are comfortable with the plan.     Deno Etienne, DO 07/04/15 1408

## 2015-07-05 DIAGNOSIS — I951 Orthostatic hypotension: Secondary | ICD-10-CM

## 2015-07-05 DIAGNOSIS — K922 Gastrointestinal hemorrhage, unspecified: Secondary | ICD-10-CM

## 2015-07-05 DIAGNOSIS — M170 Bilateral primary osteoarthritis of knee: Secondary | ICD-10-CM

## 2015-07-05 DIAGNOSIS — E785 Hyperlipidemia, unspecified: Secondary | ICD-10-CM

## 2015-07-05 DIAGNOSIS — I10 Essential (primary) hypertension: Secondary | ICD-10-CM

## 2015-07-05 DIAGNOSIS — Z8673 Personal history of transient ischemic attack (TIA), and cerebral infarction without residual deficits: Secondary | ICD-10-CM

## 2015-07-05 DIAGNOSIS — R000 Tachycardia, unspecified: Secondary | ICD-10-CM

## 2015-07-05 DIAGNOSIS — Z7982 Long term (current) use of aspirin: Secondary | ICD-10-CM

## 2015-07-05 DIAGNOSIS — D649 Anemia, unspecified: Secondary | ICD-10-CM | POA: Insufficient documentation

## 2015-07-05 DIAGNOSIS — D62 Acute posthemorrhagic anemia: Secondary | ICD-10-CM

## 2015-07-05 LAB — CBC
HCT: 23.7 % — ABNORMAL LOW (ref 36.0–46.0)
Hemoglobin: 7.8 g/dL — ABNORMAL LOW (ref 12.0–15.0)
MCH: 30.8 pg (ref 26.0–34.0)
MCHC: 32.9 g/dL (ref 30.0–36.0)
MCV: 93.7 fL (ref 78.0–100.0)
PLATELETS: 175 10*3/uL (ref 150–400)
RBC: 2.53 MIL/uL — ABNORMAL LOW (ref 3.87–5.11)
RDW: 15.9 % — AB (ref 11.5–15.5)
WBC: 8.8 10*3/uL (ref 4.0–10.5)

## 2015-07-05 LAB — BASIC METABOLIC PANEL
ANION GAP: 3 — AB (ref 5–15)
BUN: 40 mg/dL — ABNORMAL HIGH (ref 6–20)
CALCIUM: 7.8 mg/dL — AB (ref 8.9–10.3)
CO2: 22 mmol/L (ref 22–32)
CREATININE: 0.81 mg/dL (ref 0.44–1.00)
Chloride: 116 mmol/L — ABNORMAL HIGH (ref 101–111)
Glucose, Bld: 106 mg/dL — ABNORMAL HIGH (ref 65–99)
Potassium: 3.9 mmol/L (ref 3.5–5.1)
Sodium: 141 mmol/L (ref 135–145)

## 2015-07-05 LAB — HEMOGLOBIN AND HEMATOCRIT, BLOOD
HEMATOCRIT: 26.2 % — AB (ref 36.0–46.0)
HEMOGLOBIN: 8.6 g/dL — AB (ref 12.0–15.0)

## 2015-07-05 LAB — PREPARE RBC (CROSSMATCH)

## 2015-07-05 MED ORDER — SODIUM CHLORIDE 0.9 % IV SOLN
Freq: Once | INTRAVENOUS | Status: AC
  Administered 2015-07-05: 10:00:00 via INTRAVENOUS

## 2015-07-05 MED ORDER — SODIUM CHLORIDE 0.9 % IV SOLN: INTRAVENOUS | Status: AC

## 2015-07-05 NOTE — Progress Notes (Signed)
Subjective: Michelle [name].  Patient denies dizziness yesterday when sitting on side of bed, but she is still easily SOB.  She tolerated blood transfusion without complication.  She has not had a BM since admission.  Objective: Vital signs in last 24 hours: Filed Vitals:   07/05/15 0522 07/05/15 1017 07/05/15 1049 07/05/15 1114  BP: 112/51 138/50 125/52 125/62  Pulse: 97 72 70 68  Temp: 98.2 F (36.8 C) 98.3 F (36.8 C) 98.4 F (36.9 C) 98.3 F (36.8 C)  TempSrc: Oral Oral Oral Oral  Resp: 16 18 18 18   Height:      Weight:      SpO2: 100% 100% 100% 100%   Weight change:   Intake/Output Summary (Last 24 hours) at 07/05/15 1136 Last data filed at 07/05/15 1053  Gross per 24 hour  Intake    400 ml  Output    400 ml  Net      0 ml   Physical Exam  Constitutional: She is oriented to person, place, and time and well-developed, well-nourished, and in no distress. No distress.  HENT:  Head: Normocephalic and atraumatic.  Eyes: EOM are normal. No scleral icterus.  Neck: No tracheal deviation present.  Cardiovascular: Normal rate, regular rhythm and normal heart sounds.   Pulmonary/Chest: Effort normal and breath sounds normal. No stridor. No respiratory distress. She has no wheezes. She has no rales.  Abdominal: Soft. Bowel sounds are normal. She exhibits no distension. There is no rebound and no guarding.  Moderately tender to deep palpation in epigastrum.  Musculoskeletal: She exhibits no edema.  Neurological: She is alert and oriented to person, place, and time.  Skin: Skin is warm and dry. She is not diaphoretic. There is pallor.    Lab Results: Basic Metabolic Panel:  Recent Labs Lab 07/04/15 1029 07/05/15 0426  NA 139 141  K 4.5 3.9  CL 111 116*  CO2 24 22  GLUCOSE 124* 106*  BUN 66* 40*  CREATININE 0.90 0.81  CALCIUM 8.4* 7.8*   Liver Function Tests:  Recent Labs Lab 07/04/15 1029  AST 15  ALT 11*  ALKPHOS 42  BILITOT 0.3  PROT 5.3*  ALBUMIN 2.9*   No  results for input(s): LIPASE, AMYLASE in the last 168 hours. No results for input(s): AMMONIA in the last 168 hours. CBC:  Recent Labs Lab 07/04/15 1029 07/05/15 0426  WBC 9.5 8.8  NEUTROABS 7.1  --   HGB 7.5* 7.8*  HCT 23.1* 23.7*  MCV 97.5 93.7  PLT 212 175   Cardiac Enzymes: No results for input(s): CKTOTAL, CKMB, CKMBINDEX, TROPONINI in the last 168 hours. BNP: No results for input(s): PROBNP in the last 168 hours. D-Dimer: No results for input(s): DDIMER in the last 168 hours. CBG: No results for input(s): GLUCAP in the last 168 hours. Hemoglobin A1C: No results for input(s): HGBA1C in the last 168 hours. Fasting Lipid Panel: No results for input(s): CHOL, HDL, LDLCALC, TRIG, CHOLHDL, LDLDIRECT in the last 168 hours. Thyroid Function Tests: No results for input(s): TSH, T4TOTAL, FREET4, T3FREE, THYROIDAB in the last 168 hours. Coagulation: No results for input(s): LABPROT, INR in the last 168 hours. Anemia Panel:  Recent Labs Lab 07/04/15 1432  FERRITIN 35  TIBC 249*  IRON 179*  RETICCTPCT 1.6   Urine Drug Screen: Drugs of Abuse  No results found for: LABOPIA, COCAINSCRNUR, LABBENZ, AMPHETMU, THCU, LABBARB  Alcohol Level: No results for input(s): ETH in the last 168 hours. Urinalysis: No results for input(s):  COLORURINE, LABSPEC, [place], GLUCOSEU, HGBUR, BILIRUBINUR, KETONESUR, PROTEINUR, UROBILINOGEN, NITRITE, LEUKOCYTESUR in the last 168 hours.  Invalid input(s): APPERANCEUR Misc. Labs:   Micro Results: No results found for this or any previous visit (from the past 240 hour(s)). Studies/Results: Dg Chest 2 View  07/04/2015  CLINICAL DATA:  Shortness of breath beginning yesterday. EXAM: CHEST  2 VIEW COMPARISON:  05/30/2013 FINDINGS: Diffuse interstitial prominence throughout the lungs, likely chronic interstitial lung disease. Heart is normal size. No confluent opacities or effusions. No acute bony abnormality. IMPRESSION: Chronic interstitial lung  changes.  No active disease. Electronically Signed   By: Rolm Baptise M.D.   On: 07/04/2015 11:06   Medications: I have reviewed the patient's current medications. Scheduled Meds: . sodium chloride   Intravenous Once  . sodium chloride   Intravenous Once  . diclofenac sodium  2 g Topical QID  . pantoprazole  40 mg Oral BID  . sodium chloride  1,000 mL Intravenous Once   Continuous Infusions: . sodium chloride     PRN Meds:. Assessment/Plan: Principal Problem:   Upper GI bleed Active Problems:   Essential hypertension, benign   History of CVA (cerebrovascular accident)   GERD   History of breast cancer in female   History of endometrial cancer   Aneurysm of infrarenal abdominal aorta ([place])   Osteoporosis  Ms. Megan is an 82 yo female with hiatal hernia, OA, HTN, HLD, 3 cm AAA, and h/o CVA, presenting with 2 day h/o dizziness, fatigue, and DOE.   Symptomatic Anemia 2/2 UGI Bleed/PUD: Patient with h/o hiatal hernia and GERD on ASA 325mg  and Plavix presents with 6g hemoglobin drop, orthostasis, fatigue, DOE, and positive FOBT after starting Meloxicam 15 mg daily and stopping Lansaprazole therapy. Etiology most likely PUD 2/2 high dose NSAID in the setting of hiatal hernia. Labs suggestive of AOCD vs iron def. No e/o hemolysis.  Hgb corrected less than expected. This may be affected by IVF dilution, but she is likely still bleeding and remains symptomatic.  Therefore, we will transfuse another unit pRBC.  GI planning for EGD on 6/5. - GI consult, appreciate rec's - Protonix 40 mg PO BID - Full liquid diet, NPO at midnight - HOLD ASA/Plavix - STOP Meloxicam - Te[place] - PT - NS 100 mL/hr - Transfuse 1 U pRBC (now 2 total)  HTN: Patient orthostatic on presentation, but otherwise hemodynamically stable. Will hold BP meds and monitor. - HOLD Losartan  Knee OA: Patient with b/l knee pain 2/2 OA for which she was prescribed Meloxicam. Patient understands role of NSAIDs in PUD and is  agreeable to stopping therapy. - Voltaren gel - Tylenol  HLD: Atorvastatin 40 mg  FEN/GI: - NS 100 cc/hr - Full liquids, then NPO at midnight  DVT Ppx: SCDs  Dispo: Disposition is deferred at this time, awaiting improvement of current medical problems.  Anticipated discharge in approximately 2-3 day(s).   The patient does have a current PCP Hali Marry, MD) and does need an [facility] hospital follow-up appointment after discharge.  The patient does not have transportation limitations that hinder transportation to clinic appointments.  .Services Needed at time of discharge: Y = Yes, Blank = No PT:   OT:   RN:   Equipment:   Other:     LOS: 1 day   Iline Oven, MD 07/05/2015, 11:36 AM

## 2015-07-05 NOTE — Progress Notes (Signed)
Patient ID: Michelle [name], female   DOB: 08/15/1932, 82 y.o.   MRN: [id number] [facility] Progress Note  LEVY SCOTTO 82 y.o. 09/04/1932   Subjective: Denies abdominal pain. No BMs overnight. Nurse placing a new IV. Family in room.  Objective: Vital signs in last 24 hours: Filed Vitals:   07/05/15 1049 07/05/15 1114  BP: 125/52 125/62  Pulse: 70 68  Temp: 98.4 F (36.9 C) 98.3 F (36.8 C)  Resp: 18 18    Physical Exam: Gen: alert, no acute distress, elderly, frail HEENT: anicteric sclera CV: RRR Chest: CTA B Abd: LUQ tenderness with guarding, soft, nondistended, +BS Ext: no edema  Lab Results:  Recent Labs  07/04/15 1029 07/05/15 0426  NA 139 141  K 4.5 3.9  CL 111 116*  CO2 24 22  GLUCOSE 124* 106*  BUN 66* 40*  CREATININE 0.90 0.81  CALCIUM 8.4* 7.8*    Recent Labs  07/04/15 1029  AST 15  ALT 11*  ALKPHOS 42  BILITOT 0.3  PROT 5.3*  ALBUMIN 2.9*    Recent Labs  07/04/15 1029 07/05/15 0426  WBC 9.5 8.8  NEUTROABS 7.1  --   HGB 7.5* 7.8*  HCT 23.1* 23.7*  MCV 97.5 93.7  PLT 212 175   No results for input(s): LABPROT, INR in the last 72 hours.    Assessment/Plan: Symptomatic anemia in setting of NSAIDs concerning for a peptic ulcer source. I do not think she is having any active bleeding despite the lack of change in Hgb after 1 U PRBCs yesterday. Agree with additional unit of PRBCs. Full liquid diet. NPO p MN. EGD tomorrow morning at 0800. Continue PPI PO BID. Supportive care.   Humphreys C. 07/05/2015, 12:00 PM  Pager [phone]  If no answer or after 5 PM call [phone]

## 2015-07-05 NOTE — Evaluation (Signed)
Physical Therapy Evaluation Patient Details Name: Michelle [name] MRN: [id number] DOB: 1932/10/05 Today's Date: 07/05/2015   History of Present Illness  Pt is an 82 y/o female who presents with symptomatic anemia x2 days. Other symptoms included fatigue, dizziness, and DOE.   Clinical Impression  Pt admitted with above diagnosis. Pt currently with functional limitations due to the deficits listed below (see PT Problem List). At the time of PT eval pt was able to perform transfers and minimal ambulation in room with gross min guard to supervision for safety. Per RN, MD wanting pt to limit activity due to symptoms previously reported (until unit of blood was given and pt reassessed). Feel pt could have tolerated much more however limited OOB evaluation at this time. Anticipate that pt will progress well. Pt will benefit from skilled PT to increase their independence and safety with mobility to allow discharge to the venue listed below.       Follow Up Recommendations No PT follow up;Supervision for mobility/OOB    Equipment Recommendations  None recommended by PT    Recommendations for Other Services       Precautions / Restrictions Precautions Precautions: Fall Restrictions Weight Bearing Restrictions: No      Mobility  Bed Mobility Overal bed mobility: Modified Independent             General bed mobility comments: Pt was able to transition to/from EOB with no assist. HOB was flat and rails lowered to simulate home environment.   Transfers Overall transfer level: Needs assistance Equipment used: None Transfers: Sit to/from Stand Sit to Stand: Min guard         General transfer comment: Hands-on guarding provided as pt has been very dizzy with transfers since admission. Pt was able to achieve full stand without therapist assist.   Ambulation/Gait Ambulation/Gait assistance: Min guard Ambulation Distance (Feet): 8 Feet Assistive device: 1 person hand held assist Gait  Pattern/deviations: Step-through pattern;Decreased stride length;Trunk flexed Gait velocity: Decreased Gait velocity interpretation: Below normal speed for age/gender General Gait Details: Pt was able to ambulate a short distance in room with HHA for safety/support. Therapist limited activity in room per RN request. Pt did not complain of dizziness or lightheadedness throughout gait training.   Stairs            Wheelchair Mobility    Modified Rankin (Stroke Patients Only)       Balance Overall balance assessment: Needs assistance Sitting-balance support: Feet supported;No upper extremity supported Sitting balance-Leahy Scale: Good     Standing balance support: No upper extremity supported;During functional activity Standing balance-Leahy Scale: Fair                               Pertinent Vitals/Pain Pain Assessment: No/denies pain    Home Living Family/patient expects to be discharged to:: Private residence Living Arrangements: Spouse/significant other Available Help at Discharge: Family;Available 24 hours/day Type of Home: House Home Access: Level entry     Home Layout: One level Home Equipment: None      Prior Function Level of Independence: Independent               Hand Dominance   Dominant Hand: Right    Extremity/Trunk Assessment   Upper Extremity Assessment: Defer to OT evaluation           Lower Extremity Assessment: Overall WFL for tasks assessed (Good strength noted bilaterally)  Cervical / Trunk Assessment: Kyphotic  Communication   Communication: No difficulties  Cognition Arousal/Alertness: Awake/alert Behavior During Therapy: WFL for tasks assessed/performed Overall Cognitive Status: Within Functional Limits for tasks assessed                      General Comments      Exercises        Assessment/Plan    PT Assessment Patient needs continued PT services  PT Diagnosis Difficulty walking    PT Problem List Decreased strength;Decreased range of motion;Decreased activity tolerance;Decreased balance;Decreased mobility;Decreased knowledge of use of DME;Decreased safety awareness;Decreased knowledge of precautions  PT Treatment Interventions DME instruction;Gait training;Stair training;Functional mobility training;Therapeutic activities;Therapeutic exercise;Neuromuscular re-education;Patient/family education   PT Goals (Current goals can be found in the Care Plan section) Acute Rehab PT Goals Patient Stated Goal: Back to PLOF PT Goal Formulation: With patient/family Time For Goal Achievement: 07/12/15 Potential to Achieve Goals: Good    Frequency Min 3X/week   Barriers to discharge        Co-evaluation               End of Session Equipment Utilized During Treatment: Gait belt Activity Tolerance: Patient tolerated treatment well Patient left: in bed;with call bell/phone within reach;with bed alarm set;with family/visitor present Nurse Communication: Mobility status         Time: [id number] PT Time Calculation (min) (ACUTE ONLY): 32 min   Charges:   PT Evaluation $PT Eval Moderate Complexity: 1 Procedure PT Treatments $Gait Training: 8-22 mins   PT G Codes:        Rolinda Roan Jul 09, 2015, 1:26 PM   Rolinda Roan, PT, DPT Acute Rehabilitation Services Pager: [phone]

## 2015-07-05 NOTE — Progress Notes (Signed)
Subjective: No acute events overnight.  She tolerated her blood transfusion well.  Denies dizziness while sitting up.  She was able to sit up in a chair for 5 minutes but needed to lay back down after becoming short of breath.  Continues to reports dyspnea with any movement.  No bowel movements since admission to the hospital.  Denies nausea, vomiting, and abdominal pain.    Objective: Vital signs in last 24 hours: Filed Vitals:   07/04/15 2200 07/05/15 0522 07/05/15 1017 07/05/15 1049  BP: 114/60 112/51 138/50 125/52  Pulse: 77 97 72 70  Temp: 98 F (36.7 C) 98.2 F (36.8 C) 98.3 F (36.8 C) 98.4 F (36.9 C)  TempSrc: Oral Oral Oral Oral  Resp: 18 16 18 18   Height:      Weight:      SpO2: 100% 100% 100% 100%   Weight change:   Intake/Output Summary (Last 24 hours) at 07/05/15 1051 Last data filed at 07/04/15 1722  Gross per 24 hour  Intake    350 ml  Output    400 ml  Net    -50 ml   Physical Exam: General: Pleasant, well appearing female in NAD.  CV: RRR without murmurs. Normal S1 and S2 Lungs: Normal work of breathing. CTA bilaterally without wheezes or crackles Abd: Soft, nontender, nondistended. Normoactive bowel sounds Ext: No peripheral edema. Heberden nodules present both hands  Neuro: A&O x3. Appropriate mentation, follows commands Pysch: Appropriate mood and affect Skin: warm and dry with evidence of pallor  Lab Results:  Recent Labs Lab 07/04/15 1029 07/05/15 0426  WBC 9.5 8.8  HGB 7.5* 7.8*  HCT 23.1* 23.7*  PLT 212 175    Recent Labs Lab 07/04/15 1029 07/05/15 0426  NA 139 141  K 4.5 3.9  CL 111 116*  CO2 24 22  BUN 66* 40*  CREATININE 0.90 0.81  CALCIUM 8.4* 7.8*  PROT 5.3*  --   BILITOT 0.3  --   ALKPHOS 42  --   ALT 11*  --   AST 15  --   GLUCOSE 124* 106*    Studies/Results: Dg Chest 2 View  07/04/2015  CLINICAL DATA:  Shortness of breath beginning yesterday. EXAM: CHEST  2 VIEW COMPARISON:  05/30/2013 FINDINGS:  Diffuse interstitial prominence throughout the lungs, likely chronic interstitial lung disease. Heart is normal size. No confluent opacities or effusions. No acute bony abnormality. IMPRESSION: Chronic interstitial lung changes.  No active disease. Electronically Signed   By: Rolm Baptise M.D.   On: 07/04/2015 11:06   Medications: I have reviewed the patient's current medications. Scheduled Meds: . sodium chloride   Intravenous Once  . sodium chloride   Intravenous Once  . diclofenac sodium  2 g Topical QID  . pantoprazole  40 mg Oral BID  . sodium chloride  1,000 mL Intravenous Once   Continuous Infusions: . sodium chloride     PRN Meds:. Assessment/Plan: Principal Problem:   Upper GI bleed Active Problems:   Essential hypertension, benign   History of CVA (cerebrovascular accident)   GERD   History of breast cancer in female   History of endometrial cancer   Aneurysm of infrarenal abdominal aorta ([facility])   Osteoporosis  Michelle [name] is an 82 year-old female with a history of GERD, AAA, and CVA on prophylactic DAPT in the setting of recent initiation of meloxicam therapy for OA who presented with new onset shortness of breath, dizziness, and fatigue. In the ED she was found  to have a Hgb of 7.5 with a positive FOBT.    # Acute symptomatic anemia secondary to GI bleed: likely secondary to peptic ulcer disease.  She recently stopped her lansoprazole and was started on meloxicam for OA while still taking Asprin 325 and clopidogrel for secondary CVA prophylaxis.  With her history of breast and endometrial cancer we are also concerned for colon cancer, though this is less likely without hematochezia and systemic signs like weight loss. Diverticulosis is on the differential because it was seen on previous colonoscopy but this is less likely without hematochezia. Ruptured AAA is unlikely with stable vital signs, lack of abdominal bruising, and subacute presentation of symptoms. Hgb in the ED was  7.5, from 13.1 in April 2017. Iron studies suggestive of acute bleed based on normal ferritin levels.  Received 1 unit pRBCs and 1 L NS bolus with 100 mL/hr x12 hr infusion.  Hgb trended to 7.8, which is less than expected after 1 unit RBCs.In addition, soft BP and HR in the 90s with a history of bradycardia suggests she is volume down and would benefit from additional blood transfusion.  - Consult GI, recs appreciated. Plan EGD Monday 6/5 - Transfuse additional 1 unit pRBCs - CBC this afternoon.  CBC daily - Transfuse if Hgb <7 - Hold home aspirin and clopidogrel - Stop home meloxicam - Pantoprazole 40 mg PO BID - Full liquid diet if emergency procedure is needed. NPO at midnight  # Hypertension: Soft BPs in the [id number] to [id number] systolic and 0000000 diastolic.  - hold home losartan 25 mg daily  # Knee OA: Recently switched to meloxicam 15 mg daily 2 weeks ago.  - Hold meloxicam as above - Diclofenac gel QID - tylenol prn  # History of AAA: 3 cm AAA noted in 2015, was told to re-image in 2018. Given stable vitals and subacute presentation ruptured AAA is very unlikely. Will not re-image unless further workup is unremarkable  Prophylaxis: SCDs  FEN/GI - full liquid diet, NPO at midnight - 100 mL/hr NS infusion  This is a Careers information officer Note.  The care of the patient was discussed with Dr. Lindon Romp and the assessment and plan formulated with their assistance.  Please see their attached note for official documentation of the daily encounter.   LOS: 1 day   Michelle [name], Med Student 07/05/2015, 10:51 AM

## 2015-07-05 NOTE — Progress Notes (Signed)
Subjective: Michelle [name].  Patient denies dizziness yesterday when sitting on side of bed, but she is still easily SOB.  She tolerated blood transfusion without complication.  She has not had a BM since admission.  Objective: Vital signs in last 24 hours: Filed Vitals:   07/04/15 1719 07/04/15 1751 07/04/15 2200 07/05/15 0522  BP: 111/54 113/71 114/60 112/51  Pulse: 80 83 77 97  Temp: 98 F (36.7 C) 98.5 F (36.9 C) 98 F (36.7 C) 98.2 F (36.8 C)  TempSrc: Oral  Oral Oral  Resp: 16 19 18 16   Height:      Weight:      SpO2: 100% 100% 100% 100%   Weight change:   Intake/Output Summary (Last 24 hours) at 07/05/15 [id number] Last data filed at 07/04/15 1722  Gross per 24 hour  Intake    350 ml  Output    400 ml  Net    -50 ml   Physical Exam  Constitutional: She is oriented to person, place, and time and well-developed, well-nourished, and in no distress. No distress.  HENT:  Head: Normocephalic and atraumatic.  Eyes: EOM are normal. No scleral icterus.  Neck: No tracheal deviation present.  Cardiovascular: Normal rate, regular rhythm and normal heart sounds.   Pulmonary/Chest: Effort normal and breath sounds normal. No stridor. No respiratory distress. She has no wheezes. She has no rales.  Abdominal: Soft. Bowel sounds are normal. She exhibits no distension. There is no rebound and no guarding.  Moderately tender to deep palpation in epigastrum.  Musculoskeletal: She exhibits no edema.  Neurological: She is alert and oriented to person, place, and time.  Skin: Skin is warm and dry. She is not diaphoretic. There is pallor.    Lab Results: Basic Metabolic Panel:  Recent Labs Lab 07/04/15 1029  NA 139  K 4.5  CL 111  CO2 24  GLUCOSE 124*  BUN 66*  CREATININE 0.90  CALCIUM 8.4*   Liver Function Tests:  Recent Labs Lab 07/04/15 1029  AST 15  ALT 11*  ALKPHOS 42  BILITOT 0.3  PROT 5.3*  ALBUMIN 2.9*   No results for input(s): LIPASE, AMYLASE in the last 168  hours. No results for input(s): AMMONIA in the last 168 hours. CBC:  Recent Labs Lab 07/04/15 1029 07/05/15 0426  WBC 9.5 8.8  NEUTROABS 7.1  --   HGB 7.5* 7.8*  HCT 23.1* 23.7*  MCV 97.5 93.7  PLT 212 175   Cardiac Enzymes: No results for input(s): CKTOTAL, CKMB, CKMBINDEX, TROPONINI in the last 168 hours. BNP: No results for input(s): PROBNP in the last 168 hours. D-Dimer: No results for input(s): DDIMER in the last 168 hours. CBG: No results for input(s): GLUCAP in the last 168 hours. Hemoglobin A1C: No results for input(s): HGBA1C in the last 168 hours. Fasting Lipid Panel: No results for input(s): CHOL, HDL, LDLCALC, TRIG, CHOLHDL, LDLDIRECT in the last 168 hours. Thyroid Function Tests: No results for input(s): TSH, T4TOTAL, FREET4, T3FREE, THYROIDAB in the last 168 hours. Coagulation: No results for input(s): LABPROT, INR in the last 168 hours. Anemia Panel:  Recent Labs Lab 07/04/15 1432  FERRITIN 35  TIBC 249*  IRON 179*  RETICCTPCT 1.6   Urine Drug Screen: Drugs of Abuse  No results found for: LABOPIA, COCAINSCRNUR, LABBENZ, AMPHETMU, THCU, LABBARB  Alcohol Level: No results for input(s): ETH in the last 168 hours. Urinalysis: No results for input(s): COLORURINE, LABSPEC, PHURINE, GLUCOSEU, HGBUR, BILIRUBINUR, KETONESUR, PROTEINUR, UROBILINOGEN, NITRITE, LEUKOCYTESUR in  the last 168 hours.  Invalid input(s): APPERANCEUR Misc. Labs:   Micro Results: No results found for this or any previous visit (from the past 240 hour(s)). Studies/Results: Dg Chest 2 View  07/04/2015  CLINICAL DATA:  Shortness of breath beginning yesterday. EXAM: CHEST  2 VIEW COMPARISON:  05/30/2013 FINDINGS: Diffuse interstitial prominence throughout the lungs, likely chronic interstitial lung disease. Heart is normal size. No confluent opacities or effusions. No acute bony abnormality. IMPRESSION: Chronic interstitial lung changes.  No active disease. Electronically Signed   By:  Rolm Baptise M.D.   On: 07/04/2015 11:06   Medications: I have reviewed the patient's current medications. Scheduled Meds: . sodium chloride   Intravenous Once  . diclofenac sodium  2 g Topical QID  . pantoprazole  40 mg Oral BID  . sodium chloride  1,000 mL Intravenous Once   Continuous Infusions:  PRN Meds:. Assessment/Plan: Principal Problem:   Upper GI bleed Active Problems:   Essential hypertension, benign   History of CVA (cerebrovascular accident)   GERD   History of breast cancer in female   History of endometrial cancer   Aneurysm of infrarenal abdominal aorta ([place])   Osteoporosis  Ms. Halford is an 82 yo female with hiatal hernia, OA, HTN, HLD, 3 cm AAA, and h/o CVA, presenting with 2 day h/o dizziness, fatigue, and DOE.   Symptomatic Anemia 2/2 UGI Bleed/PUD: Patient with h/o hiatal hernia and GERD on ASA 325mg  and Plavix presents with 6g hemoglobin drop, orthostasis, fatigue, DOE, and positive FOBT after starting Meloxicam 15 mg daily and stopping Lansaprazole therapy. Etiology most likely PUD 2/2 high dose NSAID in the setting of hiatal hernia. Labs suggestive of AOCD vs iron def. No e/o hemolysis.  Hgb corrected less than expected. This may be affected by IVF dilution, but she is likely still bleeding and remains symptomatic.  Therefore, we will transfuse another unit pRBC.  GI planning for EGD on 6/5. - GI consult, appreciate rec's - Protonix 40 mg PO BID - Full liquid diet, NPO at midnight - HOLD ASA/Plavix - STOP Meloxicam - [place] - PT - NS 100 mL/hr - Transfuse 1 U pRBC (now 2 total)  HTN: Patient orthostatic on presentation, but otherwise hemodynamically stable. Will hold BP meds and monitor. - HOLD Losartan  Knee OA: Patient with b/l knee pain 2/2 OA for which she was prescribed Meloxicam. Patient understands role of NSAIDs in PUD and is agreeable to stopping therapy. - Voltaren gel - Tylenol  HLD: Atorvastatin 40 mg  FEN/GI: - NS 100 cc/hr - Full  liquids, then NPO at midnight  DVT Ppx: SCDs  Dispo: Disposition is deferred at this time, awaiting improvement of current medical problems.  Anticipated discharge in approximately 2-3 day(s).   The patient does have a current PCP Hali Marry, MD) and does need an [facility] hospital follow-up appointment after discharge.  The patient does not have transportation limitations that hinder transportation to clinic appointments.  .Services Needed at time of discharge: Y = Yes, Blank = No PT:   OT:   RN:   Equipment:   Other:     LOS: 1 day   Iline Oven, MD 07/05/2015, 6:38 AM

## 2015-07-05 NOTE — Anesthesia Preprocedure Evaluation (Addendum)
Anesthesia Evaluation  Patient identified by MRN, date of birth, ID band Patient awake    Reviewed: Allergy & Precautions, NPO status , Patient's Chart, lab work & pertinent test results  History of Anesthesia Complications Negative for: history of anesthetic complications  Airway Mallampati: I  TM Distance: >3 FB Neck ROM: Full    Dental  (+) Dental Advisory Given, Teeth Intact   Pulmonary former smoker (quit 1998),    breath sounds clear to auscultation       Cardiovascular hypertension, Pt. on medications (-) angina+ Peripheral Vascular Disease ('15 CT: AAA 3cm)   Rhythm:Regular Rate:Normal  '09 ECHO: EF 60%, valves OK   Neuro/Psych CVA (plavix, memory deficits), Residual Symptoms    GI/Hepatic Neg liver ROS, hiatal hernia, GERD  Controlled and Medicated,  Endo/Other  negative endocrine ROS  Renal/GU negative Renal ROS     Musculoskeletal   Abdominal   Peds  Hematology  (+) Blood dyscrasia (Hb 7.8), ,   Anesthesia Other Findings H/o breast cancer  Reproductive/Obstetrics                          Anesthesia Physical Anesthesia Plan  ASA: III  Anesthesia Plan: MAC   Post-op Pain Management:    Induction: Intravenous  Airway Management Planned: Nasal Cannula  Additional Equipment:   Intra-op Plan:   Post-operative Plan:   Informed Consent: I have reviewed the patients History and Physical, chart, labs and discussed the procedure including the risks, benefits and alternatives for the proposed anesthesia with the patient or authorized representative who has indicated his/her understanding and acceptance.   Dental advisory given  Plan Discussed with: CRNA and Surgeon  Anesthesia Plan Comments: (Plan routine monitors, MAC)        Anesthesia Quick Evaluation

## 2015-07-06 ENCOUNTER — Encounter (HOSPITAL_COMMUNITY)
Admission: EM | Disposition: A | Discharge status: Home Health | Payer: Self-pay | Source: Home / Self Care | Attending: Oncology

## 2015-07-06 ENCOUNTER — Encounter (HOSPITAL_COMMUNITY): Payer: Self-pay | Admitting: Anesthesiology

## 2015-07-06 ENCOUNTER — Inpatient Hospital Stay (HOSPITAL_COMMUNITY): Payer: Medicare Other | Admitting: Anesthesiology

## 2015-07-06 ENCOUNTER — Telehealth: Payer: Self-pay | Admitting: Family Medicine

## 2015-07-06 DIAGNOSIS — Z9889 Other specified postprocedural states: Secondary | ICD-10-CM

## 2015-07-06 DIAGNOSIS — D509 Iron deficiency anemia, unspecified: Secondary | ICD-10-CM | POA: Diagnosis present

## 2015-07-06 HISTORY — PX: ESOPHAGOGASTRODUODENOSCOPY: SHX5428

## 2015-07-06 LAB — TYPE AND SCREEN
ABO/RH(D): B POS
ANTIBODY SCREEN: NEGATIVE
UNIT DIVISION: 0
UNIT DIVISION: 0

## 2015-07-06 LAB — BASIC METABOLIC PANEL
ANION GAP: 4 — AB (ref 5–15)
BUN: 14 mg/dL (ref 6–20)
CALCIUM: 7.6 mg/dL — AB (ref 8.9–10.3)
CO2: 22 mmol/L (ref 22–32)
Chloride: 116 mmol/L — ABNORMAL HIGH (ref 101–111)
Creatinine, Ser: 0.71 mg/dL (ref 0.44–1.00)
Glucose, Bld: 112 mg/dL — ABNORMAL HIGH (ref 65–99)
Potassium: 3.9 mmol/L (ref 3.5–5.1)
Sodium: 142 mmol/L (ref 135–145)

## 2015-07-06 LAB — CBC
HEMATOCRIT: 24.5 % — AB (ref 36.0–46.0)
Hemoglobin: 8.1 g/dL — ABNORMAL LOW (ref 12.0–15.0)
MCH: 29.7 pg (ref 26.0–34.0)
MCHC: 33.1 g/dL (ref 30.0–36.0)
MCV: 89.7 fL (ref 78.0–100.0)
Platelets: 159 10*3/uL (ref 150–400)
RBC: 2.73 MIL/uL — ABNORMAL LOW (ref 3.87–5.11)
RDW: 18.7 % — AB (ref 11.5–15.5)
WBC: 7.2 10*3/uL (ref 4.0–10.5)

## 2015-07-06 SURGERY — EGD (ESOPHAGOGASTRODUODENOSCOPY)
Anesthesia: Monitor Anesthesia Care

## 2015-07-06 MED ORDER — PROPOFOL 500 MG/50ML IV EMUL
INTRAVENOUS | Status: DC | PRN
  Administered 2015-07-06: 100 ug/kg/min via INTRAVENOUS

## 2015-07-06 MED ORDER — PEG 3350-KCL-NA BICARB-NACL 420 G PO SOLR
4000.0000 mL | Freq: Once | ORAL | Status: AC
  Administered 2015-07-06: 4000 mL via ORAL
  Filled 2015-07-06: qty 4000

## 2015-07-06 MED ORDER — PROMETHAZINE HCL 25 MG/ML IJ SOLN: 6.2500 mg | INTRAMUSCULAR | Status: DC | PRN

## 2015-07-06 MED ORDER — MIDAZOLAM HCL 2 MG/2ML IJ SOLN: 0.5000 mg | Freq: Once | INTRAMUSCULAR | Status: DC | PRN

## 2015-07-06 MED ORDER — SODIUM CHLORIDE 0.9 % IV SOLN
INTRAVENOUS | Status: DC
  Administered 2015-07-06: 08:00:00 via INTRAVENOUS

## 2015-07-06 MED ORDER — FENTANYL CITRATE (PF) 100 MCG/2ML IJ SOLN: 25.0000 ug | INTRAMUSCULAR | Status: DC | PRN

## 2015-07-06 MED ORDER — LIDOCAINE HCL (CARDIAC) 20 MG/ML IV SOLN
INTRAVENOUS | Status: DC | PRN
  Administered 2015-07-06: 10 mg via INTRAVENOUS

## 2015-07-06 MED ORDER — DEXTROSE-NACL 5-0.45 % IV SOLN
INTRAVENOUS | Status: AC
  Administered 2015-07-06: 22:00:00 via INTRAVENOUS

## 2015-07-06 MED ORDER — MEPERIDINE HCL 25 MG/ML IJ SOLN: 6.2500 mg | INTRAMUSCULAR | Status: DC | PRN

## 2015-07-06 MED ORDER — SODIUM CHLORIDE 0.9 % IV SOLN: INTRAVENOUS | Status: DC

## 2015-07-06 NOTE — Anesthesia Procedure Notes (Signed)
Procedure Name: MAC Date/Time: 07/06/2015 8:05 AM Performed by: Jenne Campus Pre-anesthesia Checklist: Patient identified, Emergency Drugs available, Suction available, Patient being monitored and Timeout performed Patient Re-evaluated:Patient Re-evaluated prior to inductionOxygen Delivery Method: Nasal cannula

## 2015-07-06 NOTE — Telephone Encounter (Signed)
Called and spoke w/pt's husband and she is still is in the hospital. I told him to call if we can do anything.Elouise Munroe ]

## 2015-07-06 NOTE — Interval H&P Note (Signed)
History and Physical Interval Note:  07/06/2015 8:03 AM  Michelle [name]  has presented today for surgery, with the diagnosis of GI Bleed, anemia  The various methods of treatment have been discussed with the patient and family. After consideration of risks, benefits and other options for treatment, the patient has consented to  Procedure(s): ESOPHAGOGASTRODUODENOSCOPY (EGD) (N/A) as a surgical intervention .  The patient's history has been reviewed, patient examined, no change in status, stable for surgery.  I have reviewed the patient's chart and labs.  Questions were answered to the patient's satisfaction.     [place] C.

## 2015-07-06 NOTE — Progress Notes (Signed)
Patient ID: Michelle [name], female   DOB: [birth year], 82 y.o.   MRN: [id number] Medicine attending: I examined this patient today together with resident physician Dr. Viviano Simas and I concur with his evaluation and management plan which we discussed together. Upper endoscopy with minimal gastritis. No active ulceration or bleeding. Colonoscopy planned for tomorrow. She remains mildly tender in the epigastric region. Tender also in the left lower quadrant today. She is hemodynamically stable. Blood pressure now back to her likely baseline 145/56. Pulse rate 58 and regular. Hemoglobin stable following second unit of blood currently 8.1 g. Impression: Acute GI blood loss. Ongoing evaluation in progress. Multiple family members present. Status and plan reviewed.

## 2015-07-06 NOTE — Op Note (Signed)
[facility] Patient Name: Michelle [name] Procedure Date : 07/06/2015 MRN: [id number] Attending MD: Lear Ng , MD Date of Birth: 1932/05/18 CSN: [id number] Age: 82 Admit Type: Inpatient Procedure:                Upper GI endoscopy Indications:              Suspected upper gastrointestinal bleeding in                            patient with unexplained iron deficiency anemia,                            Abdominal pain in the left upper quadrant, Iron                            deficiency anemia Providers:                Lear Ng, MD, Zenon Mayo, RN, Cleda Daub, RN, Elspeth Cho, Technician, Luciana Axe, CRNA Referring MD:              Medicines:                Propofol per Anesthesia, Monitored Anesthesia Care Complications:            No immediate complications. Estimated Blood Loss:     Estimated blood loss: none. Procedure:                Pre-Anesthesia Assessment:                           - Prior to the procedure, a History and Physical                            was performed, and patient medications and                            allergies were reviewed. The patient's tolerance of                            previous anesthesia was also reviewed. The risks                            and benefits of the procedure and the sedation                            options and risks were discussed with the patient.                            All questions were answered, and informed consent  was obtained. Prior Anticoagulants: The patient has                            taken Plavix (clopidogrel), last dose was 3 days                            prior to procedure. ASA Grade Assessment: III - A                            patient with severe systemic disease. After                            reviewing the risks and benefits, the patient was                            deemed  in satisfactory condition to undergo the                            procedure.                           After obtaining informed consent, the endoscope was                            passed under direct vision. Throughout the                            procedure, the patient's blood pressure, pulse, and                            oxygen saturations were monitored continuously. The                            [id number]) scope was introduced through the                            mouth, and advanced to the second part of duodenum.                            The upper GI endoscopy was accomplished without                            difficulty. The patient tolerated the procedure                            well. Scope In: Scope Out: Findings:      The oropharynx was normal.      The Z-line was regular and was found 40 cm from the incisors.      The examined esophagus was normal.      Segmental minimal inflammation characterized by congestion (edema) and       linear erosions was found in the gastric antrum.      The cardia and gastric fundus were normal on retroflexion.      The exam of the stomach was  otherwise normal.      The examined duodenum was normal. Impression:               - Normal oropharynx.                           - Z-line regular, 40 cm from the incisors.                           - Normal esophagus.                           - Acute gastritis.                           - Normal examined duodenum.                           - No specimens collected. Moderate Sedation:      Moderate (conscious) sedation was administered by the endoscopy nurse       and supervised by the endoscopist. The following parameters were       monitored: oxygen saturation, heart rate, blood pressure, and response       to care. Recommendation:           - Follow an antireflux regimen.                           - Use Prilosec (omeprazole) 40 mg PO daily.                           - Perform  a colonoscopy tomorrow.                           - Post procedure medication orders were given. Procedure Code(s):        --- Professional ---                           229 424 1500, Esophagogastroduodenoscopy, flexible,                            transoral; diagnostic, including collection of                            specimen(s) by brushing or washing, when performed                            (separate procedure) Diagnosis Code(s):        --- Professional ---                           D50.9, Iron deficiency anemia, unspecified                           R10.12, Left upper quadrant pain                           K29.00, Acute gastritis without bleeding CPT copyright 2016 [employer]. All rights  reserved. The codes documented in this report are preliminary and upon coder review may  be revised to meet current compliance requirements. Lear Ng, MD 07/06/2015 8:28:34 AM This report has been signed electronically. Number of Addenda: 0

## 2015-07-06 NOTE — Care Management Note (Signed)
Case Management Note  Patient Details  Name: Michelle [name] MRN: [id number] Date of Birth: 1932-08-13  Subjective/Objective:                 SPoke with patient in the room. She states she is from home with her husband of 77 years. They both still drive. She denies needing medication assistance or resources. Is scheduled for colonoscopy tomorrow for GI Bleed of unknown source.    Action/Plan:  DC to home self care when medically stable.  Expected Discharge Date:                  Expected Discharge Plan:  Home/Self Care  In-House Referral:     Discharge planning Services  CM Consult  Post Acute Care Choice:    Choice offered to:     DME Arranged:    DME Agency:     HH Arranged:    HH Agency:     Status of Service:  In process, will continue to follow  Medicare Important Message Given:  Yes Date Medicare IM Given:    Medicare IM give by:    Date Additional Medicare IM Given:    Additional Medicare Important Message give by:     If discussed at [place] of Stay Meetings, dates discussed:    Additional Comments:  Carles Collet, RN 07/06/2015, 3:39 PM

## 2015-07-06 NOTE — Anesthesia Postprocedure Evaluation (Signed)
Anesthesia Post Note  Patient: Michelle [name]  Procedure(s) Performed: Procedure(s) (LRB): ESOPHAGOGASTRODUODENOSCOPY (EGD) (N/A)  Patient location during evaluation: Endoscopy Anesthesia Type: MAC Level of consciousness: awake and alert, oriented and patient cooperative Pain management: pain level controlled Vital Signs Assessment: post-procedure vital signs reviewed and stable Respiratory status: spontaneous breathing, nonlabored ventilation and respiratory function stable Cardiovascular status: blood pressure returned to baseline Postop Assessment: no signs of nausea or vomiting Anesthetic complications: no    Last Vitals:  Filed Vitals:   07/06/15 0850 07/06/15 0905  BP: 141/54 145/56  Pulse: 57 58  Temp:    Resp: 14 17    Last Pain:  Filed Vitals:   07/06/15 0907  PainSc: 3                  Damia Bobrowski,E. Hendrix Console

## 2015-07-06 NOTE — Brief Op Note (Signed)
Minimal gastritis otherwise normal EGD. Colonoscopy needed as next step due to severe anemia. Colon prep this afternoon and plan to do colonoscopy tomorrow.

## 2015-07-06 NOTE — Telephone Encounter (Signed)
Please call pt to get  Her schedule for hosptial f/u, either at end of this week or early next week.

## 2015-07-06 NOTE — Progress Notes (Signed)
Subjective: No acute events overnight.  She feels better this morning than previous few days.  She underwent EGD this morning without problems.  Denies dizziness when sitting up.  Claims she feels a little sleepy since her procedure.  No BM to date during this admission.  Denies fever, chills, nausea, vomiting, and abdominal pain.    Objective: Vital signs in last 24 hours: Filed Vitals:   07/06/15 0830 07/06/15 0840 07/06/15 0850 07/06/15 0905  BP: 136/55 145/56 141/54 145/56  Pulse: 62 58 57 58  Temp: 97.7 F (36.5 C)     TempSrc: Oral     Resp: 17 25 14 17   Height:      Weight:      SpO2: 100% 100% 100% 99%   Weight change:   Intake/Output Summary (Last 24 hours) at 07/06/15 1151 Last data filed at 07/06/15 1104  Gross per 24 hour  Intake    504 ml  Output   1200 ml  Net   -696 ml   Physical Exam:  General: Pleasant, well appearing female in NAD.  CV: RRR without murmurs. Normal S1 and S2 Lungs: Normal work of breathing. CTA bilaterally without wheezes or crackles Abd: Soft, nontender, nondistended. Normoactive bowel sounds Ext: No peripheral edema. [place] nodules present in both hands  Neuro: A&O x3. Appropriate mentation, follows commands Pysch: Appropriate mood and affect Skin: warm and dry with evidence of pallor, improving  Lab Results: Recent Labs Lab 07/04/15 1029 07/05/15 0426 07/05/15 1617 07/06/15 0558  WBC 9.5 8.8  --  7.2  HGB 7.5* 7.8* 8.6* 8.1*  HCT 23.1* 23.7* 26.2* 24.5*  PLT 212 175  --  159    Recent Labs Lab 07/04/15 1029 07/05/15 0426 07/06/15 0558  NA 139 141 142  K 4.5 3.9 3.9  CL 111 116* 116*  CO2 24 22 22   BUN 66* 40* 14  CREATININE 0.90 0.81 0.71  CALCIUM 8.4* 7.8* 7.6*  PROT 5.3*  --   --   BILITOT 0.3  --   --   ALKPHOS 42  --   --   ALT 11*  --   --   AST 15  --   --   GLUCOSE 124* 106* 112*   EGD 07/06/15:  Significant for minimal acute gastritis in the gastric antrum without evidence of bleeding     Medications: I have reviewed the patient's current medications. Scheduled Meds: . sodium chloride   Intravenous Once  . diclofenac sodium  2 g Topical QID  . pantoprazole  40 mg Oral BID  . polyethylene glycol-electrolytes  4,000 mL Oral Once   Continuous Infusions: . sodium chloride     PRN Meds:.fentaNYL (SUBLIMAZE) injection, meperidine (DEMEROL) injection, midazolam, promethazine Assessment/Plan: Principal Problem:   Symptomatic anemia secondary to GI bleed Active Problems   Hypertension   Bilateral Knee Osteoarthritis   Hyperlipidemia   Ms Leonhart is an 82 year-old female with a history of GERD, AAA, and CVA on prophylactic DAPT in the setting of recent initiation of meloxicam therapy for OA who presented with new onset shortness of breath, dizziness, and fatigue. In the ED she was found to have a Hgb of 7.5 with a positive FOBT.   # Acute symptomatic anemia secondary to GI bleed: She recently stopped her lansoprazole and was started on meloxicam for OA while still taking Asprin 325 and clopidogrel for secondary CVA prophylaxis. Iron studies suggestive of acute iron deficiency anemia without microscytosis.  Hgb in the ED was 7.5,  down from 13.1 in April 2017. Received 2 units pRBCs to date.  EGD 6/5 showed minimal antral gastritis without evidence of active bleeding.  GI plans for colonoscopy tomorrow morning to evaluate lower GI causes of bleeding.  Diverticular bleeding is most likely on the differential because of her known diverticulosis and age. With her history of breast and endometrial cancer we are also concerned for colon cancer, though this is less likely without hematochezia and systemic signs like weight loss.In addition, her acute iron deficiency anemia without evidence of microcytosis is more suggestive of an subacute bleed over the past 2 months which isn't likely caused by malignancy.  Ruptured AAA is unlikely with stable vital signs, lack of abdominal bruising, and  subacute presentation of symptoms. - Colonoscopy as above - CBC daily - Transfuse if Hgb <7 - Hold home aspirin and clopidogrel - Stop home meloxicam - Pantoprazole 40 mg PO BID.  Start omeprazole 40 mg daily at time of discharge per GI - Clear liquid diet with bowel prep today, NPO at midnight for procedure  # Hypertension: Soft BPs in the [id number] to [id number] systolic and 0000000 diastolic.  - hold home losartan 25 mg daily  # Knee OA: Recently switched to meloxicam 15 mg daily 2 weeks ago.  - Hold meloxicam as above - Diclofenac gel QID - tylenol prn  # History of AAA: 3 cm AAA noted in 2015, was told to re-image in 2018. Given stable vitals and subacute presentation ruptured AAA is very unlikely. Will not re-image unless further workup is unremarkable  Prophylaxis: SCDs  FEN/GI - full liquid diet, NPO at midnight - No IVF  This is a Careers information officer Note.  The care of the patient was discussed with Dr. Lindon Romp and the assessment and plan formulated with their assistance.  Please see their attached note for official documentation of the daily encounter.   LOS: 2 days   Michelle [name], Med Student 07/06/2015, 11:51 AM

## 2015-07-06 NOTE — Transfer of Care (Signed)
Immediate Anesthesia Transfer of Care Note  Patient: Michelle [name]  Procedure(s) Performed: Procedure(s): ESOPHAGOGASTRODUODENOSCOPY (EGD) (N/A)  Patient Location: Endoscopy Unit  Anesthesia Type:MAC  Level of Consciousness: awake, oriented and patient cooperative  Airway & Oxygen Therapy: Patient Spontanous Breathing and Patient connected to nasal cannula oxygen  Post-op Assessment: Report given to RN and Post -op Vital signs reviewed and stable  Post vital signs: Reviewed  Last Vitals:  Filed Vitals:   07/06/15 0829 07/06/15 0830  BP: 136/55   Pulse: 59   Temp:  36.5 C  Resp: 9     Last Pain:  Filed Vitals:   07/06/15 0830  PainSc: 3          Complications: No apparent anesthesia complications

## 2015-07-06 NOTE — H&P (View-Only) (Signed)
Patient ID: Michelle [name], female   DOB: 1932/04/19, 82 y.o.   MRN: [id number] [facility] Gastroenterology Progress Note  Michelle [name] 82 y.o. 02-11-1932   Subjective: Denies abdominal pain. No BMs overnight. Nurse placing a new IV. Family in room.  Objective: Vital signs in last 24 hours: Filed Vitals:   07/05/15 1049 07/05/15 1114  BP: 125/52 125/62  Pulse: 70 68  Temp: 98.4 F (36.9 C) 98.3 F (36.8 C)  Resp: 18 18    Physical Exam: Gen: alert, no acute distress, elderly, frail HEENT: anicteric sclera CV: RRR Chest: CTA B Abd: LUQ tenderness with guarding, soft, nondistended, +BS Ext: no edema  Lab Results:  Recent Labs  07/04/15 1029 07/05/15 0426  NA 139 141  K 4.5 3.9  CL 111 116*  CO2 24 22  GLUCOSE 124* 106*  BUN 66* 40*  CREATININE 0.90 0.81  CALCIUM 8.4* 7.8*    Recent Labs  07/04/15 1029  AST 15  ALT 11*  ALKPHOS 42  BILITOT 0.3  PROT 5.3*  ALBUMIN 2.9*    Recent Labs  07/04/15 1029 07/05/15 0426  WBC 9.5 8.8  NEUTROABS 7.1  --   HGB 7.5* 7.8*  HCT 23.1* 23.7*  MCV 97.5 93.7  PLT 212 175   No results for input(s): LABPROT, INR in the last 72 hours.    Assessment/Plan: Symptomatic anemia in setting of NSAIDs concerning for a peptic ulcer source. I do not think she is having any active bleeding despite the lack of change in Hgb after 1 U PRBCs yesterday. Agree with additional unit of PRBCs. Full liquid diet. NPO p MN. EGD tomorrow morning at 0800. Continue PPI PO BID. Supportive care.   Travis C. 07/05/2015, 12:00 PM  Pager [phone]  If no answer or after 5 PM call [phone]

## 2015-07-06 NOTE — Care Management Important Message (Signed)
Important Message  Patient Details  Name: Michelle [name] MRN: [id number] Date of Birth: January 19, 1933   Medicare Important Message Given:  Yes    Nathen May 07/06/2015, 12:10 PM

## 2015-07-06 NOTE — Progress Notes (Signed)
Subjective: NAEON.  Patient underwent EGD this morning without complication, showing minimal gastritis.  Currently, she feels a little sleepy an relaxed. She denies dizziness.  She has not had a BM since admission.  Objective: Vital signs in last 24 hours: Filed Vitals:   07/06/15 0549 07/06/15 0736 07/06/15 0829 07/06/15 0830  BP: 112/51 140/58 136/55   Pulse: 64 65 59   Temp: 98.1 F (36.7 C)   97.7 F (36.5 C)  TempSrc: Oral Oral  Oral  Resp: 16 10 9    Height:      Weight:      SpO2: 100% 100% 100%    Weight change:   Intake/Output Summary (Last 24 hours) at 07/06/15 0847 Last data filed at 07/05/15 2224  Gross per 24 hour  Intake    814 ml  Output   1200 ml  Net   -386 ml   Physical Exam  Constitutional: She is oriented to person, place, and time and well-developed, well-nourished, and in no distress. No distress.  HENT:  Head: Normocephalic and atraumatic.  Eyes: EOM are normal. No scleral icterus.  Neck: No tracheal deviation present.  Cardiovascular: Normal rate, regular rhythm and normal heart sounds.   Pulmonary/Chest: Effort normal and breath sounds normal. No stridor. No respiratory distress. She has no wheezes. She has no rales.  Abdominal: Soft. Bowel sounds are normal. She exhibits no distension. There is no rebound and no guarding.  Minimally tender to deep palpation in epigastrum.  Musculoskeletal: She exhibits no edema.  Neurological: She is alert and oriented to person, place, and time.  Skin: Skin is warm and dry. She is not diaphoretic.  Pallor improving.    Lab Results: Basic Metabolic Panel:  Recent Labs Lab 07/05/15 0426 07/06/15 0558  NA 141 142  K 3.9 3.9  CL 116* 116*  CO2 22 22  GLUCOSE 106* 112*  BUN 40* 14  CREATININE 0.81 0.71  CALCIUM 7.8* 7.6*   Liver Function Tests:  Recent Labs Lab 07/04/15 1029  AST 15  ALT 11*  ALKPHOS 42  BILITOT 0.3  PROT 5.3*  ALBUMIN 2.9*   No results for input(s): LIPASE, AMYLASE in  the last 168 hours. No results for input(s): AMMONIA in the last 168 hours. CBC:  Recent Labs Lab 07/04/15 1029 07/05/15 0426 07/05/15 1617 07/06/15 0558  WBC 9.5 8.8  --  7.2  NEUTROABS 7.1  --   --   --   HGB 7.5* 7.8* 8.6* 8.1*  HCT 23.1* 23.7* 26.2* 24.5*  MCV 97.5 93.7  --  89.7  PLT 212 175  --  159   Cardiac Enzymes: No results for input(s): CKTOTAL, CKMB, CKMBINDEX, TROPONINI in the last 168 hours. BNP: No results for input(s): PROBNP in the last 168 hours. D-Dimer: No results for input(s): DDIMER in the last 168 hours. CBG: No results for input(s): GLUCAP in the last 168 hours. Hemoglobin A1C: No results for input(s): HGBA1C in the last 168 hours. Fasting Lipid Panel: No results for input(s): CHOL, HDL, LDLCALC, TRIG, CHOLHDL, LDLDIRECT in the last 168 hours. Thyroid Function Tests: No results for input(s): TSH, T4TOTAL, FREET4, T3FREE, THYROIDAB in the last 168 hours. Coagulation: No results for input(s): LABPROT, INR in the last 168 hours. Anemia Panel:  Recent Labs Lab 07/04/15 1432  FERRITIN 35  TIBC 249*  IRON 179*  RETICCTPCT 1.6   Urine Drug Screen: Drugs of Abuse  No results found for: LABOPIA, COCAINSCRNUR, LABBENZ, AMPHETMU, THCU, LABBARB  Alcohol Level:  No results for input(s): ETH in the last 168 hours. Urinalysis: No results for input(s): COLORURINE, LABSPEC, PHURINE, GLUCOSEU, HGBUR, BILIRUBINUR, KETONESUR, PROTEINUR, UROBILINOGEN, NITRITE, LEUKOCYTESUR in the last 168 hours.  Invalid input(s): APPERANCEUR Misc. Labs:   Micro Results: No results found for this or any previous visit (from the past 240 hour(s)). Studies/Results: Dg Chest 2 View  07/04/2015  CLINICAL DATA:  Shortness of breath beginning yesterday. EXAM: CHEST  2 VIEW COMPARISON:  05/30/2013 FINDINGS: Diffuse interstitial prominence throughout the lungs, likely chronic interstitial lung disease. Heart is normal size. No confluent opacities or effusions. No acute bony  abnormality. IMPRESSION: Chronic interstitial lung changes.  No active disease. Electronically Signed   By: Rolm Baptise M.D.   On: 07/04/2015 11:06   Medications: I have reviewed the patient's current medications. Scheduled Meds: . [MAR Hold] sodium chloride   Intravenous Once  . [MAR Hold] diclofenac sodium  2 g Topical QID  . [MAR Hold] pantoprazole  40 mg Oral BID  . [MAR Hold] sodium chloride  1,000 mL Intravenous Once   Continuous Infusions: . sodium chloride     PRN Meds:. Assessment/Plan: Principal Problem:   Upper GI bleed Active Problems:   Essential hypertension, benign   History of CVA (cerebrovascular accident)   GERD   History of breast cancer in female   History of endometrial cancer   Aneurysm of infrarenal abdominal aorta (HCC)   Osteoporosis   Symptomatic anemia   Iron deficiency anemia, unspecified  Michelle [name] is an 82 yo female with hiatal hernia, OA, HTN, HLD, 3 cm AAA, and h/o CVA, presenting with 2 day h/o dizziness, fatigue, and DOE.   Symptomatic Anemia 2/2 GI Bleed, improving: Patient with h/o hiatal hernia and GERD on ASA 325mg  and Plavix presents with 6g hemoglobin drop, orthostasis, fatigue, DOE, and positive FOBT after starting Meloxicam 15 mg daily and stopping Lansaprazole therapy. EGD this morning shows minimal gastritis without evidence of active bleeding.  GI plans for colonoscopy tomorrow to r/o LGI causes - GI consult, appreciate rec's - Protonix 40 mg PO BID.  Discharge on Omeprazole 40 mg daily - Clear liquid diet, NPO at midnight - HOLD ASA/Plavix - STOP Meloxicam - Teley - s/p 2 U pRBC  HTN: Patient orthostatic on presentation, but otherwise hemodynamically stable. Will hold BP meds and monitor. - HOLD Losartan  Knee OA: Patient with b/l knee pain 2/2 OA for which she was prescribed Meloxicam. Patient understands role of NSAIDs in PUD and is agreeable to stopping therapy. - Voltaren gel - Tylenol  HLD: Atorvastatin 40  mg  FEN/GI: - Clear liquids, then NPO at midnight  DVT Ppx: SCDs  Dispo: Disposition is deferred at this time, awaiting improvement of current medical problems.  Anticipated discharge in approximately 2-3 day(s).   The patient does have a current PCP Hali Marry, MD) and does need an [facility] hospital follow-up appointment after discharge.  The patient does not have transportation limitations that hinder transportation to clinic appointments.  .Services Needed at time of discharge: Y = Yes, Blank = No PT:   OT:   RN:   Equipment:   Other:     LOS: 2 days   Michelle Oven, MD 07/06/2015, 8:47 AM

## 2015-07-07 ENCOUNTER — Inpatient Hospital Stay (HOSPITAL_COMMUNITY): Payer: Medicare Other | Admitting: Anesthesiology

## 2015-07-07 ENCOUNTER — Encounter (HOSPITAL_COMMUNITY)
Admission: EM | Disposition: A | Discharge status: Home Health | Payer: Self-pay | Source: Home / Self Care | Attending: Oncology

## 2015-07-07 ENCOUNTER — Encounter (HOSPITAL_COMMUNITY): Payer: Self-pay | Admitting: Gastroenterology

## 2015-07-07 LAB — CBC
HCT: 23.3 % — ABNORMAL LOW (ref 36.0–46.0)
Hemoglobin: 7.7 g/dL — ABNORMAL LOW (ref 12.0–15.0)
MCH: 29.8 pg (ref 26.0–34.0)
MCHC: 33 g/dL (ref 30.0–36.0)
MCV: 90.3 fL (ref 78.0–100.0)
PLATELETS: 183 10*3/uL (ref 150–400)
RBC: 2.58 MIL/uL — AB (ref 3.87–5.11)
RDW: 17.7 % — ABNORMAL HIGH (ref 11.5–15.5)
WBC: 7.2 10*3/uL (ref 4.0–10.5)

## 2015-07-07 SURGERY — INVASIVE LAB ABORTED CASE
Anesthesia: Monitor Anesthesia Care

## 2015-07-07 MED ORDER — LACTATED RINGERS IV SOLN
INTRAVENOUS | Status: DC | PRN
  Administered 2015-07-07: 14:00:00 via INTRAVENOUS

## 2015-07-07 MED ORDER — PROPOFOL 500 MG/50ML IV EMUL
INTRAVENOUS | Status: DC | PRN
  Administered 2015-07-07: 100 ug/kg/min via INTRAVENOUS

## 2015-07-07 MED ORDER — LIDOCAINE HCL (CARDIAC) 20 MG/ML IV SOLN
INTRAVENOUS | Status: DC | PRN
  Administered 2015-07-07: 20 mg via INTRATRACHEAL

## 2015-07-07 NOTE — Anesthesia Postprocedure Evaluation (Signed)
Anesthesia Post Note  Patient: Michelle [name]  Procedure(s) Performed: Procedure(s) (LRB): COLONOSCOPY WITH PROPOFOL (N/A)  Patient location during evaluation: PACU Anesthesia Type: MAC Level of consciousness: awake and alert Pain management: pain level controlled Vital Signs Assessment: post-procedure vital signs reviewed and stable Respiratory status: spontaneous breathing, nonlabored ventilation, respiratory function stable and patient connected to nasal cannula oxygen Cardiovascular status: stable and blood pressure returned to baseline Anesthetic complications: no    Last Vitals:  Filed Vitals:   07/07/15 1301 07/07/15 1413  BP: 143/48   Pulse: 54   Temp: 36.7 C   Resp: 11 8    Last Pain:  Filed Vitals:   07/07/15 1414  PainSc: 0-No pain                 [name] DAVID

## 2015-07-07 NOTE — Progress Notes (Signed)
Patient ID: Michelle [name], female   DOB: March 02, 1932, 82 y.o.   MRN: [id number] Medicine attending: I examined this patient today together with resident physician Dr. Viviano Simas and I concur with his evaluation and management plan which we discussed together. Her exam remains stable with minimal to no tenderness on palpation in the epigastric and left lower quadrant. She reports melena following the bowel preparation for colonoscopy today. There was a transient dip in her blood pressure which rebounded quickly with normal saline. Hemoglobin remained overall stable at 7.7 g compared with 8.1 yesterday. She proceeded with colonoscopy but unfortunately there was too much stool for a good exam. She will be put on a clear liquid diet and the procedure will be rescheduled.

## 2015-07-07 NOTE — Transfer of Care (Signed)
Immediate Anesthesia Transfer of Care Note  Patient: Michelle [name]  Procedure(s) Performed: Procedure(s): COLONOSCOPY WITH PROPOFOL (N/A)  Patient Location: Endoscopy Unit  Anesthesia Type:MAC  Level of Consciousness: awake, alert  and oriented  Airway & Oxygen Therapy: Patient Spontanous Breathing  Post-op Assessment: Report given to RN  Post vital signs: Reviewed and stable  Last Vitals:  Filed Vitals:   07/07/15 1301 07/07/15 1413  BP: 143/48   Pulse: 54   Temp: 36.7 C   Resp: 11 8    Last Pain:  Filed Vitals:   07/07/15 1414  PainSc: 0-No pain         Complications: No apparent anesthesia complications

## 2015-07-07 NOTE — Progress Notes (Signed)
Subjective: No acute events overnight.  She denies dizziness, shortness of breath, and abdominal pain.  She reports passing multiple black stools while on the bowel prep for colonoscopy this afternoon.  No other complaints at this time.    Objective: Vital signs in last 24 hours: Filed Vitals:   07/06/15 1432 07/06/15 2156 07/07/15 0528 07/07/15 0756  BP: 116/44 135/60 94/49 124/44  Pulse: 48 69 64 60  Temp:  97.6 F (36.4 C) 97.6 F (36.4 C)   TempSrc:   Oral   Resp: 19 18 16 17   Height:      Weight:      SpO2: 100% 100% 100% 100%   Weight change:   Intake/Output Summary (Last 24 hours) at 07/07/15 1105 Last data filed at 07/07/15 0900  Gross per 24 hour  Intake   4000 ml  Output      0 ml  Net   4000 ml   Physical Exam:  General: Pleasant, well appearing female in NAD.  CV: RRR without murmurs. Normal S1 and S2 Lungs: Normal work of breathing. CTA bilaterally without wheezes or crackles Abd: Soft, nontender, nondistended. Normoactive bowel sounds Ext: No peripheral edema. Neuro: A&O x3.  Skin: warm and dry with evidence of pallor, improving  Lab Results: Recent Labs Lab 07/05/15 0426 07/05/15 1617 07/06/15 0558 07/07/15 0554  WBC 8.8  --  7.2 7.2  HGB 7.8* 8.6* 8.1* 7.7*  HCT 23.7* 26.2* 24.5* 23.3*  PLT 175  --  159 183    Recent Labs Lab 07/04/15 1029 07/05/15 0426 07/06/15 0558  NA 139 141 142  K 4.5 3.9 3.9  CL 111 116* 116*  CO2 24 22 22   BUN 66* 40* 14  CREATININE 0.90 0.81 0.71  CALCIUM 8.4* 7.8* 7.6*  PROT 5.3*  --   --   BILITOT 0.3  --   --   ALKPHOS 42  --   --   ALT 11*  --   --   AST 15  --   --   GLUCOSE 124* 106* 112*    Medications: I have reviewed the patient's current medications. Scheduled Meds: . sodium chloride   Intravenous Once  . diclofenac sodium  2 g Topical QID  . pantoprazole  40 mg Oral BID   Continuous Infusions: . sodium chloride     PRN Meds:.fentaNYL (SUBLIMAZE) injection, meperidine (DEMEROL)  injection, midazolam, promethazine Assessment/Plan: Principal Problem:  Symptomatic anemia secondary to GI bleed Active Problems  Hypertension  Bilateral Knee Osteoarthritis  Hyperlipidemia  Michelle [name] is an 82 year-old female with a history of GERD, AAA, and CVA on prophylactic DAPT in the setting of recent initiation of NSAIDs for OA who presented with new onset shortness of breath, dizziness, and fatigue. In the ED she was found to have a Hgb of 7.5 with a positive FOBT.   # Acute symptomatic anemia secondary to GI bleed: She recently stopped her lansoprazole and was started on meloxicam for OA while still taking Asprin 325 and clopidogrel for secondary CVA prophylaxis. Hgb in the ED was 7.5, down from 13.1 in April 2017. Received 2 units pRBCs to date. EGD 6/5 showed minimal antral gastritis without evidence of active bleeding. Hgb stable at 7.7.  GI plans for colonoscopy this afternoon to evaluate lower GI causes of bleeding. Diverticular bleeding is most likely on the differential because of her known diverticulosis and age. With her history of breast and endometrial cancer we are also concerned for colon cancer, though  this is less likely without hematochezia and systemic signs like weight loss.In addition, normocytic RBCs are more suggestive of an subacute bleed over the past 2 months which isn't likely caused by malignancy. Ruptured AAA is unlikely with stable vital signs, lack of abdominal bruising, and subacute presentation of symptoms. - Colonoscopy as above - CBC daily - Transfuse if Hgb <7 - Hold home aspirin and clopidogrel - Stop home meloxicam - Pantoprazole 40 mg PO BID. Start omeprazole 40 mg daily at time of discharge per GI   # Hypertension: Soft BPs in the [id number] to [id number] systolic and 0000000 diastolic.  - hold home losartan 25 mg daily  # Knee OA: Recently switched to meloxicam 15 mg daily 2 weeks ago.  - Hold meloxicam as above - Diclofenac gel QID -  tylenol prn  # History of AAA: 3 cm AAA noted in 2015, was told to re-image in 2018. Given stable vitals and subacute presentation ruptured AAA is very unlikely. Will not re-image unless further workup is unremarkable.  Prophylaxis: SCDs  FEN/GI - Currently NPO, normal diet post procedure - IVF  This is a Careers information officer Note.  The care of the patient was discussed with Dr. Lovena Le and the assessment and plan formulated with their assistance.  Please see their attached note for official documentation of the daily encounter.   LOS: 3 days   Michelle [name], Med Student 07/07/2015, 11:05 AM

## 2015-07-07 NOTE — Progress Notes (Signed)
Physical Therapy Note Patient is making gradual progress toward mobility goals. Continues to be limited by fatigue and generalized weakness. Recommending HHPT for further skilled PT services to maximize independence and safety with mobility.     07/07/15 1300  PT Visit Information  Last PT Received On 07/07/15  Assistance Needed +1  History of Present Illness Pt is an 82 y/o female who presents with symptomatic anemia x2 days. Other symptoms included fatigue, dizziness, and DOE.   Subjective Data  Patient Stated Goal Back to PLOF  Precautions  Precautions Fall  Restrictions  Weight Bearing Restrictions No  Pain Assessment  Pain Assessment No/denies pain  Cognition  Arousal/Alertness Awake/alert  Behavior During Therapy WFL for tasks assessed/performed  Overall Cognitive Status Within Functional Limits for tasks assessed  Bed Mobility  Overal bed mobility Modified Independent  General bed mobility comments Pt was able to transition to/from EOB with no assist. HOB was flat and rails lowered to simulate home environment.   Transfers  Overall transfer level Needs assistance  Equipment used None  Transfers Sit to/from Stand  Sit to Stand Min guard  General transfer comment min guard for safety; no unsteadiness noted and no c/o dizziness upon stand  Ambulation/Gait  Ambulation/Gait assistance Min guard  Ambulation Distance (Feet) 80 Feet  Assistive device (pt used hand rail intermittently)  Gait Pattern/deviations Step-through pattern;Decreased stride length;Drifts right/left  General Gait Details cues for cadence and step length; slow, guarded gait with use of handrail intermittently in hallway; unsteady at times with fatigue; standing rest breaks needed  Gait velocity Decreased  Balance  Overall balance assessment Needs assistance  Sitting-balance support No upper extremity supported;Feet supported  Sitting balance-Leahy Scale Good  Standing balance support No upper extremity  supported  Standing balance-Leahy Scale Fair  PT - End of Session  Equipment Utilized During Treatment Gait belt  Activity Tolerance Patient tolerated treatment well  Patient left in bed;with call bell/phone within reach;with bed alarm set;with family/visitor present  Nurse Communication Mobility status  PT - Assessment/Plan  PT Plan Discharge plan needs to be updated  PT Frequency (ACUTE ONLY) Min 3X/week  Follow Up Recommendations HHPT;Supervision for mobility/OOB  PT equipment None recommended by PT  PT Goal Progression  Progress towards PT goals Progressing toward goals  Acute Rehab PT Goals  PT Goal Formulation With patient/family  Time For Goal Achievement 07/12/15  Potential to Achieve Goals Good  PT Time Calculation  PT Start Time (ACUTE ONLY) 1205  PT Stop Time (ACUTE ONLY) 1229  PT Time Calculation (min) (ACUTE ONLY) 24 min  PT General Charges  $$ ACUTE PT VISIT 1 Procedure  PT Treatments  $Gait Training 8-22 mins  $Therapeutic Activity 8-22 mins  Earney Navy, PTA Pager: [phone]

## 2015-07-07 NOTE — Anesthesia Preprocedure Evaluation (Signed)
Anesthesia Evaluation  Patient identified by MRN, date of birth, ID band Patient awake    Reviewed: Allergy & Precautions, NPO status , Patient's Chart, lab work & pertinent test results  Airway Mallampati: I  TM Distance: >3 FB Neck ROM: Full    Dental   Pulmonary former smoker,    Pulmonary exam normal        Cardiovascular hypertension, Pt. on medications Normal cardiovascular exam     Neuro/Psych CVA    GI/Hepatic GERD  Medicated and Controlled,  Endo/Other    Renal/GU      Musculoskeletal   Abdominal   Peds  Hematology   Anesthesia Other Findings   Reproductive/Obstetrics                             Anesthesia Physical Anesthesia Plan  ASA: II  Anesthesia Plan: MAC   Post-op Pain Management:    Induction: Intravenous  Airway Management Planned: Natural Airway  Additional Equipment:   Intra-op Plan:   Post-operative Plan:   Informed Consent: I have reviewed the patients History and Physical, chart, labs and discussed the procedure including the risks, benefits and alternatives for the proposed anesthesia with the patient or authorized representative who has indicated his/her understanding and acceptance.     Plan Discussed with: CRNA and Surgeon  Anesthesia Plan Comments:         Anesthesia Quick Evaluation

## 2015-07-07 NOTE — Progress Notes (Signed)
Subjective: Michelle [name].  She denies CP or SOB.  She was able to ambulate to bathroom without issue.  She tolerated bowel prep but reports continued black stools.  Objective: Vital signs in last 24 hours: Filed Vitals:   07/06/15 0905 07/06/15 1432 07/06/15 2156 07/07/15 0528  BP: 145/56 116/44 135/60 94/49  Pulse: 58 48 69 64  Temp:   97.6 F (36.4 C) 97.6 F (36.4 C)  TempSrc:    Oral  Resp: 17 19 18 16   Height:      Weight:      SpO2: 99% 100% 100% 100%   Weight change:   Intake/Output Summary (Last 24 hours) at 07/07/15 0724 Last data filed at 07/06/15 1104  Gross per 24 hour  Intake    100 ml  Output      0 ml  Net    100 ml   Physical Exam  Constitutional: She is oriented to person, place, and time and well-developed, well-nourished, and in no distress. No distress.  HENT:  Head: Normocephalic and atraumatic.  Eyes: EOM are normal. No scleral icterus.  Neck: No tracheal deviation present.  Cardiovascular: Normal rate, regular rhythm and normal heart sounds.   Pulmonary/Chest: Effort normal and breath sounds normal. No stridor. No respiratory distress. She has no wheezes. She has no rales.  Abdominal: Soft. Bowel sounds are normal. She exhibits no distension. There is no rebound and no guarding.  Nontender to palpation.  Musculoskeletal: She exhibits no edema.  Neurological: She is alert and oriented to person, place, and time.  Skin: Skin is warm and dry. She is not diaphoretic.  Pallor improving.    Lab Results: Basic Metabolic Panel:  Recent Labs Lab 07/05/15 0426 07/06/15 0558  NA 141 142  K 3.9 3.9  CL 116* 116*  CO2 22 22  GLUCOSE 106* 112*  BUN 40* 14  CREATININE 0.81 0.71  CALCIUM 7.8* 7.6*   Liver Function Tests:  Recent Labs Lab 07/04/15 1029  AST 15  ALT 11*  ALKPHOS 42  BILITOT 0.3  PROT 5.3*  ALBUMIN 2.9*   No results for input(s): LIPASE, AMYLASE in the last 168 hours. No results for input(s): AMMONIA in the last 168  hours. CBC:  Recent Labs Lab 07/04/15 1029 07/05/15 0426 07/05/15 1617 07/06/15 0558  WBC 9.5 8.8  --  7.2  NEUTROABS 7.1  --   --   --   HGB 7.5* 7.8* 8.6* 8.1*  HCT 23.1* 23.7* 26.2* 24.5*  MCV 97.5 93.7  --  89.7  PLT 212 175  --  159   Cardiac Enzymes: No results for input(s): CKTOTAL, CKMB, CKMBINDEX, TROPONINI in the last 168 hours. BNP: No results for input(s): PROBNP in the last 168 hours. D-Dimer: No results for input(s): DDIMER in the last 168 hours. CBG: No results for input(s): GLUCAP in the last 168 hours. Hemoglobin A1C: No results for input(s): HGBA1C in the last 168 hours. Fasting Lipid Panel: No results for input(s): CHOL, HDL, LDLCALC, TRIG, CHOLHDL, LDLDIRECT in the last 168 hours. Thyroid Function Tests: No results for input(s): TSH, T4TOTAL, FREET4, T3FREE, THYROIDAB in the last 168 hours. Coagulation: No results for input(s): LABPROT, INR in the last 168 hours. Anemia Panel:  Recent Labs Lab 07/04/15 1432  FERRITIN 35  TIBC 249*  IRON 179*  RETICCTPCT 1.6   Urine Drug Screen: Drugs of Abuse  No results found for: LABOPIA, COCAINSCRNUR, LABBENZ, AMPHETMU, THCU, LABBARB  Alcohol Level: No results for input(s): ETH in  the last 168 hours. Urinalysis: No results for input(s): COLORURINE, LABSPEC, PHURINE, GLUCOSEU, HGBUR, BILIRUBINUR, KETONESUR, PROTEINUR, UROBILINOGEN, NITRITE, LEUKOCYTESUR in the last 168 hours.  Invalid input(s): APPERANCEUR Misc. Labs:   Micro Results: No results found for this or any previous visit (from the past 240 hour(s)). Studies/Results: No results found. Medications: I have reviewed the patient's current medications. Scheduled Meds: . sodium chloride   Intravenous Once  . diclofenac sodium  2 g Topical QID  . pantoprazole  40 mg Oral BID   Continuous Infusions: . sodium chloride    . dextrose 5 % and 0.45% NaCl 75 mL/hr at 07/06/15 2220   PRN Meds:. Assessment/Plan: Principal Problem:   Upper GI  bleed Active Problems:   Essential hypertension, benign   History of CVA (cerebrovascular accident)   GERD   History of breast cancer in female   History of endometrial cancer   Aneurysm of infrarenal abdominal aorta (HCC)   Osteoporosis   Symptomatic anemia   Iron deficiency anemia, unspecified  Michelle [name] is an 82 yo female with hiatal hernia, OA, HTN, HLD, 3 cm AAA, and h/o CVA, presenting with 2 day h/o dizziness, fatigue, and DOE.   Symptomatic Anemia 2/2 GI Bleed, improving: Patient with h/o hiatal hernia and GERD on ASA 325mg  and Plavix presents with 6g hemoglobin drop, orthostasis, fatigue, DOE, and positive FOBT after starting Meloxicam 15 mg daily and stopping Lansaprazole therapy. EGD showed minimal gastritis without evidence of active bleeding.  GI plans for colonoscopy to r/o LGI causes. Hemoglobin continuing to trend down, however, so we will monitor patient overnight and recheck labs tomorrow. - GI consult, appreciate rec's - Protonix 40 mg PO BID.  Discharge on Omeprazole 40 mg daily - NPO for colonoscopy - HOLD ASA/Plavix - STOP Meloxicam - Teley - s/p 2 U pRBC [ ]  Colonoscopy  HTN: Patient orthostatic on presentation, but otherwise hemodynamically stable. Will hold BP meds and monitor. - HOLD Losartan  Knee OA: Patient with b/l knee pain 2/2 OA for which she was prescribed Meloxicam. Patient understands role of NSAIDs in PUD and is agreeable to stopping therapy. - Voltaren gel - Tylenol  HLD: Atorvastatin 40 mg  FEN/GI: - NPO  DVT Ppx: SCDs  Dispo: Disposition is deferred at this time, awaiting improvement of current medical problems.  Anticipated discharge in approximately 2-3 day(s).   The patient does have a current PCP Michelle Marry, MD) and does need an [facility] hospital follow-up appointment after discharge.  The patient does not have transportation limitations that hinder transportation to clinic appointments.  .Services Needed at time of  discharge: Y = Yes, Blank = No PT:   OT:   RN:   Equipment:   Other:     LOS: 3 days   Iline Oven, MD 07/07/2015, 7:24 AM

## 2015-07-07 NOTE — Op Note (Signed)
[facility] Patient Name: Michelle [name] Procedure Date : 07/07/2015 MRN: [id number] Attending MD: Wonda Horner , MD Date of Birth: 10-01-1932 CSN: [id number] Age: 82 Admit Type: Inpatient Procedure:                Colonoscopy Indications:              Gastrointestinal bleeding Providers:                Wonda Horner, MD, Cleda Daub, RN, [facility], Technician Referring MD:              Medicines:                Propofol per Anesthesia Complications:            No immediate complications. Estimated Blood Loss:     Estimated blood loss: none. Procedure:                Pre-Anesthesia Assessment:                           - Prior to the procedure, a History and Physical                            was performed, and patient medications and                            allergies were reviewed. The patient's tolerance of                            previous anesthesia was also reviewed. The risks                            and benefits of the procedure and the sedation                            options and risks were discussed with the patient.                            All questions were answered, and informed consent                            was obtained. Prior Anticoagulants: The patient has                            taken Plavix (clopidogrel), last dose was 3 days                            prior to procedure. ASA Grade Assessment: II - A                            patient with mild systemic disease. After reviewing  the risks and benefits, the patient was deemed in                            satisfactory condition to undergo the procedure.                           After obtaining informed consent, the colonoscope                            was passed under direct vision. Throughout the                            procedure, the patient's blood pressure, pulse, and                            oxygen saturations  were monitored continuously. The                            EC-[id number]) scope was introduced through                            the anus with the intention of advancing to the                            cecum. The scope was advanced to the sigmoid colon                            before the procedure was aborted. Medications were                            given. No anatomical landmarks were photographed.                            The colonoscopy was performed without difficulty.                            The patient tolerated the procedure well. The                            quality of the bowel preparation was 100 percent                            obscured. Scope In: 2:03:22 PM Scope Out: 2:05:35 PM Total Procedure Duration: 0 hours 2 minutes 13 seconds  Findings:      The perianal and digital rectal examinations were normal.      Extensive amounts of stool was found in the rectum and in the       recto-sigmoid colon, precluding visualization. Impression:               - Stool in the rectum and in the recto-sigmoid                            colon.                           -  No specimens collected. Moderate Sedation:      . Recommendation:           - Clear liquid diet.                           - Repeat colonoscopy tomorrow.                           - Continue present medications. Procedure Code(s):        --- Professional ---                           [phone], 58, Colonoscopy, flexible; diagnostic,                            including collection of specimen(s) by brushing or                            washing, when performed (separate procedure) Diagnosis Code(s):        --- Professional ---                           K92.2, Gastrointestinal hemorrhage, unspecified CPT copyright 2016 [employer]. All rights reserved. The codes documented in this report are preliminary and upon coder review may  be revised to meet current compliance requirements. Anson Fret, MD Wonda Horner, MD 07/07/2015 2:14:06 PM This report has been signed electronically. Number of Addenda: 0

## 2015-07-08 LAB — CBC
HEMATOCRIT: 24.1 % — AB (ref 36.0–46.0)
Hemoglobin: 7.8 g/dL — ABNORMAL LOW (ref 12.0–15.0)
MCH: 29.7 pg (ref 26.0–34.0)
MCHC: 32.4 g/dL (ref 30.0–36.0)
MCV: 91.6 fL (ref 78.0–100.0)
Platelets: 193 10*3/uL (ref 150–400)
RBC: 2.63 MIL/uL — ABNORMAL LOW (ref 3.87–5.11)
RDW: 17.4 % — AB (ref 11.5–15.5)
WBC: 6.3 10*3/uL (ref 4.0–10.5)

## 2015-07-08 MED ORDER — BISACODYL 5 MG PO TBEC
10.0000 mg | DELAYED_RELEASE_TABLET | Freq: Once | ORAL | Status: AC
  Administered 2015-07-08: 10 mg via ORAL
  Filled 2015-07-08: qty 2

## 2015-07-08 MED ORDER — PEG 3350-KCL-NA BICARB-NACL 420 G PO SOLR
4000.0000 mL | Freq: Once | ORAL | Status: AC
Start: 2015-07-08 — End: 2015-07-08
  Administered 2015-07-08: 4000 mL via ORAL
  Filled 2015-07-08: qty 4000

## 2015-07-08 NOTE — Progress Notes (Signed)
Subjective: Michelle [name].  She denies CP or SOB.  She worked with PT yesterday, only needing to stop to rest once.  Her BMs this continue to be black and sticky.  Due to stool in the colon yesterday, colonoscopy could not be completed and she will go again on Thursday.  Objective: Vital signs in last 24 hours: Filed Vitals:   07/07/15 1440 07/07/15 1512 07/07/15 2148 07/08/15 0549  BP:  143/60 120/58 124/49  Pulse: 49 61 57 61  Temp:  98.2 F (36.8 C) 98.3 F (36.8 C) 98.8 F (37.1 C)  TempSrc:  Oral Oral Oral  Resp: 13 18 16 16   Height:      Weight:      SpO2: 95% 99% 100% 97%   Weight change:   Intake/Output Summary (Last 24 hours) at 07/08/15 0805 Last data filed at 07/08/15 0015  Gross per 24 hour  Intake    210 ml  Output   1050 ml  Net   -840 ml   Physical Exam  Constitutional: She is oriented to person, place, and time. No distress.  HENT:  Head: Normocephalic and atraumatic.  Eyes: EOM are normal. No scleral icterus.  Neck: No tracheal deviation present.  Cardiovascular: Normal rate, regular rhythm and normal heart sounds.   Pulmonary/Chest: Effort normal and breath sounds normal. No stridor. No respiratory distress. She has no wheezes. She has no rales.  Abdominal: Soft. Bowel sounds are normal. She exhibits no distension. There is no rebound and no guarding.  Nontender to palpation.  Musculoskeletal: She exhibits no edema.  Neurological: She is alert and oriented to person, place, and time.  Skin: Skin is warm and dry. She is not diaphoretic.  Pallor improving.    Lab Results: Basic Metabolic Panel:  Recent Labs Lab 07/05/15 0426 07/06/15 0558  NA 141 142  K 3.9 3.9  CL 116* 116*  CO2 22 22  GLUCOSE 106* 112*  BUN 40* 14  CREATININE 0.81 0.71  CALCIUM 7.8* 7.6*   Liver Function Tests:  Recent Labs Lab 07/04/15 1029  AST 15  ALT 11*  ALKPHOS 42  BILITOT 0.3  PROT 5.3*  ALBUMIN 2.9*   No results for input(s): LIPASE, AMYLASE in the last 168  hours. No results for input(s): AMMONIA in the last 168 hours. CBC:  Recent Labs Lab 07/04/15 1029  07/07/15 0554 07/08/15 0538  WBC 9.5  < > 7.2 6.3  NEUTROABS 7.1  --   --   --   HGB 7.5*  < > 7.7* 7.8*  HCT 23.1*  < > 23.3* 24.1*  MCV 97.5  < > 90.3 91.6  PLT 212  < > 183 193  < > = values in this interval not displayed. Cardiac Enzymes: No results for input(s): CKTOTAL, CKMB, CKMBINDEX, TROPONINI in the last 168 hours. BNP: No results for input(s): PROBNP in the last 168 hours. D-Dimer: No results for input(s): DDIMER in the last 168 hours. CBG: No results for input(s): GLUCAP in the last 168 hours. Hemoglobin A1C: No results for input(s): HGBA1C in the last 168 hours. Fasting Lipid Panel: No results for input(s): CHOL, HDL, LDLCALC, TRIG, CHOLHDL, LDLDIRECT in the last 168 hours. Thyroid Function Tests: No results for input(s): TSH, T4TOTAL, FREET4, T3FREE, THYROIDAB in the last 168 hours. Coagulation: No results for input(s): LABPROT, INR in the last 168 hours. Anemia Panel:  Recent Labs Lab 07/04/15 1432  FERRITIN 35  TIBC 249*  IRON 179*  RETICCTPCT 1.6  Urine Drug Screen: Drugs of Abuse  No results found for: LABOPIA, COCAINSCRNUR, LABBENZ, AMPHETMU, THCU, LABBARB  Alcohol Level: No results for input(s): ETH in the last 168 hours. Urinalysis: No results for input(s): COLORURINE, LABSPEC, PHURINE, GLUCOSEU, HGBUR, BILIRUBINUR, KETONESUR, PROTEINUR, UROBILINOGEN, NITRITE, LEUKOCYTESUR in the last 168 hours.  Invalid input(s): APPERANCEUR Misc. Labs:   Micro Results: No results found for this or any previous visit (from the past 240 hour(s)). Studies/Results: No results found. Medications: I have reviewed the patient's current medications. Scheduled Meds: . sodium chloride   Intravenous Once  . diclofenac sodium  2 g Topical QID  . pantoprazole  40 mg Oral BID   Continuous Infusions: . sodium chloride     PRN  Meds:. Assessment/Plan: Principal Problem:   Upper GI bleed Active Problems:   Essential hypertension, benign   History of CVA (cerebrovascular accident)   GERD   History of breast cancer in female   History of endometrial cancer   Aneurysm of infrarenal abdominal aorta (HCC)   Osteoporosis   Symptomatic anemia   Iron deficiency anemia, unspecified  Ms. Bartos is an 82 yo female with hiatal hernia, OA, HTN, HLD, 3 cm AAA, and h/o CVA, presenting with 2 day h/o dizziness, fatigue, and DOE.   Symptomatic Anemia 2/2 GI Bleed, improving: Patient with h/o hiatal hernia and GERD on ASA 325mg  and Plavix presents with 6g hemoglobin drop, orthostasis, fatigue, DOE, and positive FOBT after starting Meloxicam 15 mg daily and stopping Lansaprazole therapy. EGD showed minimal gastritis without evidence of active bleeding.  GI plans for colonoscopy to r/o LGI causes. Hemoglobin stable.  Repeat colonoscopy 6/8. - GI consult, appreciate rec's - Protonix 40 mg PO BID.  Discharge on Omeprazole 40 mg daily - NPO for colonoscopy - HOLD ASA/Plavix - STOP Meloxicam - Teley - s/p 2 U pRBC [ ]  Colonoscopy  HTN: Patient orthostatic on presentation, but otherwise hemodynamically stable. Will hold BP meds and monitor. - HOLD Losartan  Knee OA: Patient with b/l knee pain 2/2 OA for which she was prescribed Meloxicam. Patient understands role of NSAIDs in PUD and is agreeable to stopping therapy. - Voltaren gel - Tylenol  HLD: Atorvastatin 40 mg  FEN/GI: - NPO  DVT Ppx: SCDs  Dispo: Disposition is deferred at this time, awaiting improvement of current medical problems.  Anticipated discharge in approximately 2-3 day(s).   The patient does have a current PCP Hali Marry, MD) and does need an [facility] hospital follow-up appointment after discharge.  The patient does not have transportation limitations that hinder transportation to clinic appointments.  .Services Needed at time of discharge: Y  = Yes, Blank = No PT:   OT:   RN:   Equipment:   Other:     LOS: 4 days   Iline Oven, MD 07/08/2015, 8:05 AM

## 2015-07-08 NOTE — Discharge Summary (Signed)
Name: Michelle [name] MRN: [id number] DOB: [birth year] 82 y.o. PCP: Hali Marry, MD  Date of Admission: 07/04/2015  9:49 AM Date of Discharge: 07/09/2015 Attending Physician: Annia Belt, MD  Discharge Diagnosis: 1. GI Bleed   Principal Problem:   Upper GI bleed Active Problems:   Essential hypertension, benign   History of CVA (cerebrovascular accident)   GERD   History of breast cancer in female   History of endometrial cancer   Aneurysm of infrarenal abdominal aorta (HCC)   Osteoporosis   Symptomatic anemia   Iron deficiency anemia, unspecified  Discharge Medications:   Medication List    STOP taking these medications        aspirin 325 MG tablet     lansoprazole 15 MG capsule  Commonly known as:  PREVACID     meloxicam 15 MG tablet  Commonly known as:  MOBIC      TAKE these medications        acetaminophen 325 MG tablet  Commonly known as:  TYLENOL  Take 650 mg by mouth as needed for mild pain.     alendronate 70 MG tablet  Commonly known as:  FOSAMAX  Take 1 tablet (70 mg total) by mouth every 7 (seven) days. Take with a full glass of water on an empty stomach.     AMBULATORY NON FORMULARY MEDICATION  Medication Name: Moderate grade compression stocking to just below the knee.  Dx is venous stasis. 20-21mmgHg.     atorvastatin 40 MG tablet  Commonly known as:  LIPITOR  Take 20 mg by mouth every other day.     cholecalciferol 1000 units tablet  Commonly known as:  VITAMIN D  Take 1,000 Units by mouth daily.     clopidogrel 75 MG tablet  Commonly known as:  PLAVIX  TAKE 1 TABLET BY MOUTH EVERY DAY     diclofenac sodium 1 % Gel  Commonly known as:  VOLTAREN  Apply 2 g topically 4 (four) times daily.     ferrous sulfate 325 (65 FE) MG tablet  Take 1 tablet (325 mg total) by mouth daily with breakfast.     fluticasone 50 MCG/ACT nasal spray  Commonly known as:  FLONASE  SPRITZ 2 SPRAYS INTO EACH NOSTRIL DAILY     ICAPS AREDS 2 PO   Take by mouth.     losartan 25 MG tablet  Commonly known as:  COZAAR  TAKE 1 TABLET (25 MG TOTAL) BY MOUTH DAILY.     omeprazole 40 MG capsule  Commonly known as:  PRILOSEC  Take 1 capsule (40 mg total) by mouth daily.     VIACTIV [id number] MG-UNT-MCG Chew  Generic drug:  Calcium-Vitamin D-Vitamin K  CHEW 1 TABLET BY MOUTH EVERY MORNING.        Disposition and follow-up:   Michelle [name] was discharged from [facility] in Good condition.  At the hospital follow up visit please address:  1.  Continued GI bleeding, PPI adherence, avoidance of NSAIDs, ASA, and Plavix  2.  Labs / imaging needed at time of follow-up: CBC  3.  Pending labs/ test needing follow-up: none  Follow-up Appointments: Follow-up Information    Follow up with METHENEY,CATHERINE, MD. Go on 07/30/2015.   Specialty:  Family Medicine   Why:  @ 1:45 for hospital follow up.  Will call if they can see you sooner.     Contact information:   [place]  [place] [address] [phone]       Discharge Instructions: Discharge Instructions    Call MD for:  difficulty breathing, headache or visual disturbances    Complete by:  As directed      Call MD for:  extreme fatigue    Complete by:  As directed      Call MD for:  persistant dizziness or light-headedness    Complete by:  As directed      Diet - low sodium heart healthy    Complete by:  As directed      Increase activity slowly    Complete by:  As directed            Consultations: Treatment Team:  Wilford Corner, MD  Procedures Performed:  Dg Chest 2 View  07/04/2015  CLINICAL DATA:  Shortness of breath beginning yesterday. EXAM: CHEST  2 VIEW COMPARISON:  05/30/2013 FINDINGS: Diffuse interstitial prominence throughout the lungs, likely chronic interstitial lung disease. Heart is normal size. No confluent opacities or effusions. No acute bony abnormality. IMPRESSION: Chronic interstitial lung changes.   No active disease. Electronically Signed   By: Rolm Baptise M.D.   On: 07/04/2015 11:06   Dg Knee 1-2 Views Left  06/23/2015  CLINICAL DATA:  Bilateral AP standing views of both knees with right lateral and sunrise views of the right knee. EXAM: LEFT KNEE - 1-2 VIEW; RIGHT KNEE - COMPLETE 4+ VIEW COMPARISON:  None in PACs FINDINGS: The bones are adequately mineralized. There is narrowing of the medial joint compartments bilaterally greatest on the left. The lateral compartments are preserved. There is mild narrowing of the lateral aspect of the right patellofemoral compartment. There is beaking of the tibial spines on the right. There is a small spur arising from the superior and lateral articular margins of the patella. There is no joint effusion. There is no acute fracture nor dislocation. IMPRESSION: There are degenerative changes of both knees centered on the medial joint compartments. In addition on the right there is spurring of the tibial spines and the articular margins of the patella with mild narrowing of the lateral aspect of the patellofemoral joint. Electronically Signed   By: David  Martinique M.D.   On: 06/23/2015 08:26   Dg Knee Complete 4 Views Right  06/23/2015  CLINICAL DATA:  Bilateral AP standing views of both knees with right lateral and sunrise views of the right knee. EXAM: LEFT KNEE - 1-2 VIEW; RIGHT KNEE - COMPLETE 4+ VIEW COMPARISON:  None in PACs FINDINGS: The bones are adequately mineralized. There is narrowing of the medial joint compartments bilaterally greatest on the left. The lateral compartments are preserved. There is mild narrowing of the lateral aspect of the right patellofemoral compartment. There is beaking of the tibial spines on the right. There is a small spur arising from the superior and lateral articular margins of the patella. There is no joint effusion. There is no acute fracture nor dislocation. IMPRESSION: There are degenerative changes of both knees centered on  the medial joint compartments. In addition on the right there is spurring of the tibial spines and the articular margins of the patella with mild narrowing of the lateral aspect of the patellofemoral joint. Electronically Signed   By: David  Martinique M.D.   On: 06/23/2015 08:26    2D Echo:   Cardiac Cath:   Admission HPI: Michelle [name] is an 82 yo female with hiatal hernia, OA, HTN, HLD, 3 cm AAA, and h/o CVA, presenting with  2 day h/o dizziness, fatigue, and DOE. She states she noticed it yesterday when she became SOB while walking in the grocery store. She would also become acutely dizzy after standing. She has never had symptoms like this before. She endorses LUQ/epigastric pain after starting Meloxicam 2 weeks ago for knee OA. She reports initially taking 15 mg daily, but was then told to split the pill into 7.5mg  BID. After starting, she noticed worsening "burning" and pain similar to her previous episodes of GERD. She had also been on Lansaprazole for the GERD, but stopped it two weeks ago at the direction of her physician. After she stopped it, she noticed worsening GERD and restarted it herself 3 days ago. She endorses cough and nausea, without vomiting. She denies changes in bowel habits, hematochezia, or melena. She has a h/o moderately large hiatal hernia seen on EGD in 2008 without signs of reflux or ulcer. She had a colonoscopy in 2014 showing diverticulosis and polyps. She has a h/o CVA for which she is currently on ASA 325 mg and Plavix daily. She denies h/o MI. She has a h/o infrarenal AAA, stable at 3 cm when last measured in 2015. 3 year repeat US was recommended at that time.  In the ED, her BP 100-120/60-70. Hgb was found to have fallen from 13 in April to 7.5. Patient was symptomatically dizzy when sitting up. FOBT was positive. She was given 1 U pRBCs.  Hospital Course by problem list: Principal Problem:   Upper GI bleed Active Problems:   Essential hypertension,  benign   History of CVA (cerebrovascular accident)   GERD   History of breast cancer in female   History of endometrial cancer   Aneurysm of infrarenal abdominal aorta (HCC)   Osteoporosis   Symptomatic anemia   Iron deficiency anemia, unspecified   GI Bleed 2/2 Gastritis: Patient received 2 U pRBCs and monitored overnight without signs of continued bleeding. EGD demonstrated mild gastritis without signs of overt bleeding.  She then underwent bowel prep for colonoscopy, which was initially incomplete and colonoscopy could not be performed.  She underwent repeat colonoscopy on 6/8, which showed diverticulosis without evidence of active bleeding.  Patient's hemoglobin remained stable during admission following blood transfusions and she was discharged on Lansoprazole 30 mg daily and daily iron supplementation.  She was directed to avoid Meloxicam and all NSAIDs.  Her ASA was discontinued at discharge, but Plavix was continued for secondary prevention of stroke.  At follow up, please recheck patient's CBC to ensure stable hemoglobin.  Discharge Vitals:   BP 131/52 mmHg  Pulse 60  Temp(Src) 98.3 F (36.8 C) (Oral)  Resp 13  Ht 5\' 7"  (1.702 m)  Wt 180 lb (81.647 kg)  BMI 28.19 kg/m2  SpO2 100%  Discharge Labs:  Results for orders placed or performed during the hospital encounter of 07/04/15 (from the past 24 hour(s))  CBC     Status: Abnormal   Collection Time: 07/09/15  4:50 AM  Result Value Ref Range   WBC 6.8 4.0 - 10.5 K/uL   RBC 2.58 (L) 3.87 - 5.11 MIL/uL   Hemoglobin 7.9 (L) 12.0 - 15.0 g/dL   HCT 23.7 (L) 36.0 - 46.0 %   MCV 91.9 78.0 - 100.0 fL   MCH 30.6 26.0 - 34.0 pg   MCHC 33.3 30.0 - 36.0 g/dL   RDW 17.2 (H) 11.5 - 15.5 %   Platelets 221 150 - 400 K/uL    Signed: Iline Oven, MD  07/09/2015, 1:21 PM    Services Ordered on Discharge: none Equipment Ordered on Discharge: none

## 2015-07-08 NOTE — Progress Notes (Signed)
Pt.had already 4 bowel movements still  black loose stool  after taking Golytely .DR .Amedeo Plenty was called & made aware & no further orders received except to let her keep on going until morning.Will continue to monitor pt.

## 2015-07-08 NOTE — Progress Notes (Signed)
No complaints today. Will prep again for colonoscopy tomorrow.

## 2015-07-08 NOTE — Progress Notes (Signed)
Patient ID: Michelle [name], female   DOB: November 18, 1932, 82 y.o.   MRN: [id number] Medicine attending: I examined this patient today and I concur with the evaluation and management plan as recorded by resident physician Dr. Viviano Simas which we discussed together. Her abdomen is soft and nontender at this time. She continues to pass melena. Hemoglobin remains low but stable at 7.8. She will repeat a bowel preparation today. She is rescheduled for colonoscopy tomorrow.

## 2015-07-08 NOTE — Progress Notes (Signed)
Subjective: No acute events overnight.  Patient denies shortness of breath and dizziness.  She continues to report dark sticky, melenic stools with the most recent being this morning.  Unable to complete colonoscopy yesterday secondary to stool in her rectum and sigmoid colon.  Denies abdominal pain.    Objective: Vital signs in last 24 hours: Filed Vitals:   07/07/15 1440 07/07/15 1512 07/07/15 2148 07/08/15 0549  BP:  143/60 120/58 124/49  Pulse: 49 61 57 61  Temp:  98.2 F (36.8 C) 98.3 F (36.8 C) 98.8 F (37.1 C)  TempSrc:  Oral Oral Oral  Resp: 13 18 16 16   Height:      Weight:      SpO2: 95% 99% 100% 97%   Weight change:   Intake/Output Summary (Last 24 hours) at 07/08/15 1135 Last data filed at 07/08/15 0926  Gross per 24 hour  Intake    690 ml  Output   1650 ml  Net   -960 ml   Physical Exam:  General: Pleasant, well appearing female in NAD.  CV: RRR without murmurs. Normal S1 and S2 Lungs: Normal work of breathing. CTA bilaterally without wheezes or crackles Abd: Soft, nontender, nondistended. Normoactive bowel sounds Ext: No peripheral edema. Neuro: A&O x3.  Skin: warm and dry with evidence of pallor, improving  Lab Results:  Recent Labs Lab 07/06/15 0558 07/07/15 0554 07/08/15 0538  WBC 7.2 7.2 6.3  HGB 8.1* 7.7* 7.8*  HCT 24.5* 23.3* 24.1*  PLT 159 183 193    Recent Labs Lab 07/04/15 1029 07/05/15 0426 07/06/15 0558  NA 139 141 142  K 4.5 3.9 3.9  CL 111 116* 116*  CO2 24 22 22   BUN 66* 40* 14  CREATININE 0.90 0.81 0.71  CALCIUM 8.4* 7.8* 7.6*  PROT 5.3*  --   --   BILITOT 0.3  --   --   ALKPHOS 42  --   --   ALT 11*  --   --   AST 15  --   --   GLUCOSE 124* 106* 112*   Medications: I have reviewed the patient's current medications. Scheduled Meds: . sodium chloride   Intravenous Once  . diclofenac sodium  2 g Topical QID  . pantoprazole  40 mg Oral BID  . polyethylene glycol-electrolytes  4,000 mL Oral Once    Continuous Infusions: . sodium chloride    . Assessment/Plan: Principal Problem:  Symptomatic anemia secondary to GI bleed Active Problems  Hypertension  Bilateral Knee Osteoarthritis  Hyperlipidemia   History of AAA  Michelle [name] is an 82 year-old female with a history of GERD, AAA, and CVA on prophylactic DAPT in the setting of recent initiation of NSAIDs for OA who presented with new onset shortness of breath, dizziness, and fatigue. In the ED she was found to have a Hgb of 7.5 with a positive FOBT.   # Acute symptomatic anemia secondary to GI bleed: She recently stopped her lansoprazole and was started on meloxicam for OA while still taking Asprin 325 and clopidogrel for secondary CVA prophylaxis. Hgb in the ED was 7.5, down from 13.1 in April 2017. Received 2 units pRBCs to date. EGD 6/5 showed minimal antral gastritis without evidence of active bleeding. Colonoscopy 6/6 failed secondary to remaining stool in the rectum and sigmoid colon.  Plan for repeat colonoscopy 6/8.   Hgb stable at 7.8.  Diverticular bleeding is likely on the differential because of her known diverticulosis and age. With her history  of breast and endometrial cancer we are also concerned for colon cancer, though this is less likely without hematochezia and systemic signs like weight loss.In addition, normocytic RBCs are more suggestive of an subacute bleed over the past 2 months which isn't likely caused by malignancy.Peptic ulcer disease is still very likely given her clinical history, even with an EGD showing mild gastritis without active bleeding or ulcers.   - Colonoscopy as above.   - Clear liquid diet with bowel prep later today - CBC daily - Transfuse if Hgb <7 - Hold home aspirin and clopidogrel - Stop home meloxicam - Pantoprazole 40 mg PO BID. Start omeprazole 40 mg daily at time of discharge per GI  # Hypertension: Soft BPs in the [id number] to [id number] systolic and 0000000 diastolic.  - hold home  losartan 25 mg daily  # Knee OA: Recently switched to meloxicam 15 mg daily 2 weeks ago.  - Hold meloxicam as above - Diclofenac gel QID - tylenol prn  # History of AAA: 3 cm AAA noted in 2015, was told to re-image in 2018. Given stable vitals and subacute presentation ruptured AAA is very unlikely. Will not re-image unless further workup is unremarkable.  Prophylaxis: SCDs  FEN/GI - Clear liquid diet with bowel prep this afternoon.  NPO after midnight for am colonoscopy tomorrow.  - IVF  This is a Careers information officer Note.  The care of the patient was discussed with Dr. Lindon Romp and the assessment and plan formulated with their assistance.  Please see their attached note for official documentation of the daily encounter.   LOS: 4 days   Jobie Quaker, Med Student 07/08/2015, 11:35 AM2

## 2015-07-09 ENCOUNTER — Inpatient Hospital Stay (HOSPITAL_COMMUNITY): Payer: Medicare Other | Admitting: Anesthesiology

## 2015-07-09 ENCOUNTER — Encounter (HOSPITAL_COMMUNITY): Payer: Self-pay | Admitting: Anesthesiology

## 2015-07-09 ENCOUNTER — Encounter (HOSPITAL_COMMUNITY)
Admission: EM | Disposition: A | Discharge status: Home Health | Payer: Self-pay | Source: Home / Self Care | Attending: Oncology

## 2015-07-09 HISTORY — PX: COLONOSCOPY WITH PROPOFOL: SHX5780

## 2015-07-09 LAB — CBC
HCT: 23.7 % — ABNORMAL LOW (ref 36.0–46.0)
Hemoglobin: 7.9 g/dL — ABNORMAL LOW (ref 12.0–15.0)
MCH: 30.6 pg (ref 26.0–34.0)
MCHC: 33.3 g/dL (ref 30.0–36.0)
MCV: 91.9 fL (ref 78.0–100.0)
Platelets: 221 10*3/uL (ref 150–400)
RBC: 2.58 MIL/uL — ABNORMAL LOW (ref 3.87–5.11)
RDW: 17.2 % — AB (ref 11.5–15.5)
WBC: 6.8 10*3/uL (ref 4.0–10.5)

## 2015-07-09 SURGERY — COLONOSCOPY WITH PROPOFOL
Anesthesia: Monitor Anesthesia Care

## 2015-07-09 MED ORDER — MIDAZOLAM HCL 5 MG/5ML IJ SOLN
INTRAMUSCULAR | Status: DC | PRN
  Administered 2015-07-09 (×2): 1 mg via INTRAVENOUS

## 2015-07-09 MED ORDER — LANSOPRAZOLE 15 MG PO CPDR: 30.0000 mg | DELAYED_RELEASE_CAPSULE | Freq: Every day | ORAL | Status: DC

## 2015-07-09 MED ORDER — FLEET ENEMA 7-19 GM/118ML RE ENEM
1.0000 | ENEMA | Freq: Once | RECTAL | Status: AC
  Administered 2015-07-09: 1 via RECTAL
  Filled 2015-07-09: qty 1

## 2015-07-09 MED ORDER — PROPOFOL 500 MG/50ML IV EMUL
INTRAVENOUS | Status: DC | PRN
  Administered 2015-07-09: 100 ug/kg/min via INTRAVENOUS

## 2015-07-09 MED ORDER — FERROUS SULFATE 325 (65 FE) MG PO TABS: 325.0000 mg | ORAL_TABLET | Freq: Every day | ORAL | Status: DC

## 2015-07-09 MED ORDER — DICLOFENAC SODIUM 1 % TD GEL: 2.0000 g | Freq: Four times a day (QID) | TRANSDERMAL | Status: DC

## 2015-07-09 MED ORDER — OMEPRAZOLE 40 MG PO CPDR: 40.0000 mg | DELAYED_RELEASE_CAPSULE | Freq: Every day | ORAL | Status: DC

## 2015-07-09 MED ORDER — PANTOPRAZOLE SODIUM 40 MG PO TBEC: 40.0000 mg | DELAYED_RELEASE_TABLET | Freq: Every day | ORAL | Status: DC

## 2015-07-09 MED ORDER — PANTOPRAZOLE SODIUM 40 MG PO TBEC: 40.0000 mg | DELAYED_RELEASE_TABLET | Freq: Two times a day (BID) | ORAL | Status: DC

## 2015-07-09 MED ORDER — FENTANYL CITRATE (PF) 100 MCG/2ML IJ SOLN
INTRAMUSCULAR | Status: DC | PRN
Start: 2015-07-09 — End: 2015-07-09
  Administered 2015-07-09: 50 ug via INTRAVENOUS

## 2015-07-09 MED ORDER — FLEET ENEMA 7-19 GM/118ML RE ENEM
2.0000 | ENEMA | Freq: Once | RECTAL | Status: AC
  Administered 2015-07-09: 2 via RECTAL
  Filled 2015-07-09: qty 2

## 2015-07-09 MED ORDER — LACTATED RINGERS IV SOLN
INTRAVENOUS | Status: DC | PRN
  Administered 2015-07-09: 09:00:00 via INTRAVENOUS

## 2015-07-09 NOTE — Op Note (Signed)
[facility] Patient Name: Michelle [name] Procedure Date : 07/09/2015 MRN: [id number] Attending MD: Wonda Horner , MD Date of Birth: [birth year] CSN: [id number] Age: 82 Admit Type: Inpatient Procedure:                Colonoscopy Indications:              Heme positive stool Providers:                Wonda Horner, MD, Malka So, RN, Cletis Athens, Technician Referring MD:              Medicines:                Propofol per Anesthesia Complications:            No immediate complications. Estimated Blood Loss:     Estimated blood loss: none. Procedure:                Pre-Anesthesia Assessment:                           - Prior to the procedure, a History and Physical                            was performed, and patient medications and                            allergies were reviewed. The patient's tolerance of                            previous anesthesia was also reviewed. The risks                            and benefits of the procedure and the sedation                            options and risks were discussed with the patient.                            All questions were answered, and informed consent                            was obtained. Prior Anticoagulants: The patient has                            taken Plavix (clopidogrel), last dose was 6 days                            prior to procedure. ASA Grade Assessment: II - A                            patient with mild systemic disease. After reviewing  the risks and benefits, the patient was deemed in                            satisfactory condition to undergo the procedure.                           After obtaining informed consent, the colonoscope                            was passed under direct vision. Throughout the                            procedure, the patient's blood pressure, pulse, and                            oxygen saturations were  monitored continuously. The                            [id number]) scope was introduced through                            the anus and advanced to the the cecum, identified                            by appendiceal orifice and ileocecal valve. The                            ileocecal valve, appendiceal orifice, and rectum                            were photographed. The colonoscopy was performed                            without difficulty. The patient tolerated the                            procedure well. The quality of the bowel                            preparation was adequate. Scope In: 9:09:57 AM Scope Out: 9:22:00 AM Scope Withdrawal Time: 0 hours 6 minutes 0 seconds  Total Procedure Duration: 0 hours 12 minutes 3 seconds  Findings:      The perianal and digital rectal examinations were normal.      Multiple diverticula were found in the sigmoid colon.      The exam was otherwise without abnormality.      an area of ink spot was noted in the proximal ascending colon where a       prior polyp was removed. Impression:               - Diverticulosis in the sigmoid colon.                           - The examination was otherwise normal.                           -  No specimens collected. Recommendation:           - Resume regular diet.                           - Continue present medications.                           - Repeat colonoscopy none. Procedure Code(s):        --- Professional ---                           [phone], Colonoscopy, flexible; diagnostic, including                            collection of specimen(s) by brushing or washing,                            when performed (separate procedure) Diagnosis Code(s):        --- Professional ---                           R19.5, Other fecal abnormalities                           K57.30, Diverticulosis of large intestine without                            perforation or abscess without bleeding CPT copyright 2016  American Medical Association. All rights reserved. The codes documented in this report are preliminary and upon coder review may  be revised to meet current compliance requirements. Anson Fret, MD Wonda Horner, MD 07/09/2015 9:33:08 AM This report has been signed electronically. Number of Addenda: 0

## 2015-07-09 NOTE — Discharge Summary (Signed)
Ms. Sybilla presented with new onset shortness of breath, dizziness, and fatigue in the setting of NSAID use while on dual-antiplatelet therapy for secondary ischemic stroke prophylaxis.  In the ED she was found to have a hemoglobin of 7.5 with a positive fecal occult blood test.  Further workup for dyspnea including troponins, chest x ray, and ECG which were all unremarkable.  Iron studies and CBC suggested acute iron deficiency anemia without evidence of microcytosis.  She was given a total of 2 units of pRBCs during the admission.    EGD 6/5 was significant for minimal antral gastritis without evidence of bleeding.  Colonoscopy 6/6 was unable to be completed secondary to residual stool in the rectum and sigmoid colon.  Repeat colonoscopy 6/8 significant for multiple sigmoid diverticula without evidence of ulcers or bleeding.  Gastritis secondary to NSAID use in the setting of dual antiplatelet therapy remains the most likely cause of bleeding.     At the time of discharge, she is feeling much better and her hemoglobin had stabilized at 7.9.  We stopped her meloxicam and aspirin and started her on iron supplements.  She will continue with her home Losartan and Plavix.  We will arrange follow up with her PCP on 6/29.

## 2015-07-09 NOTE — Anesthesia Preprocedure Evaluation (Signed)
Anesthesia Evaluation  Patient identified by MRN, date of birth, ID band Patient awake    Reviewed: Allergy & Precautions, NPO status , Patient's Chart, lab work & pertinent test results  History of Anesthesia Complications Negative for: history of anesthetic complications  Airway Mallampati: I  TM Distance: >3 FB Neck ROM: Full    Dental  (+) Dental Advisory Given, Teeth Intact   Pulmonary former smoker,    breath sounds clear to auscultation       Cardiovascular hypertension, Pt. on medications (-) angina+ Peripheral Vascular Disease ('15 CT: AAA 3cm)   Rhythm:Regular Rate:Normal  '09 ECHO: EF 60%, valves OK   Neuro/Psych CVA (plavix, memory deficits), Residual Symptoms    GI/Hepatic Neg liver ROS, hiatal hernia, GERD  Controlled and Medicated,  Endo/Other  negative endocrine ROS  Renal/GU negative Renal ROS     Musculoskeletal   Abdominal   Peds  Hematology  (+) Blood dyscrasia (Hb 7.8), ,   Anesthesia Other Findings H/o breast cancer  Reproductive/Obstetrics                             Anesthesia Physical  Anesthesia Plan  ASA: III  Anesthesia Plan: MAC   Post-op Pain Management:    Induction: Intravenous  Airway Management Planned: Nasal Cannula  Additional Equipment:   Intra-op Plan:   Post-operative Plan:   Informed Consent: I have reviewed the patients History and Physical, chart, labs and discussed the procedure including the risks, benefits and alternatives for the proposed anesthesia with the patient or authorized representative who has indicated his/her understanding and acceptance.   Dental advisory given  Plan Discussed with: CRNA and Surgeon  Anesthesia Plan Comments: (Plan routine monitors, MAC)        Anesthesia Quick Evaluation

## 2015-07-09 NOTE — Progress Notes (Signed)
Subjective: No acute events overnight.  Patient is feeling well and happy the colonoscopy is completed.  She has no active complaints.  She wants to go home.   Objective: Vital signs in last 24 hours: Filed Vitals:   07/09/15 0528 07/09/15 0846 07/09/15 0930 07/09/15 0940  BP: 102/47 139/60 120/39 131/52  Pulse: 60 75 59 60  Temp: 98.1 F (36.7 C) 98.3 F (36.8 C)    TempSrc: Oral Oral    Resp: 16 11 10 13   Height:      Weight:      SpO2: 100% 98% 100% 100%   Weight change:   Intake/Output Summary (Last 24 hours) at 07/09/15 1130 Last data filed at 07/09/15 0930  Gross per 24 hour  Intake    658 ml  Output      0 ml  Net    658 ml   Physical Exam:  General: Pleasant, well appearing female in NAD.  CV: RRR without murmurs. Normal S1 and S2 Lungs: Normal work of breathing. CTA bilaterally without wheezes or crackles Abd: Soft, nontender, nondistended. Normoactive bowel sounds Ext: No peripheral edema. Neuro: A&O x3.   Lab Results:  Recent Labs Lab 07/07/15 0554 07/08/15 0538 07/09/15 0450  WBC 7.2 6.3 6.8  HGB 7.7* 7.8* 7.9*  HCT 23.3* 24.1* 23.7*  PLT 183 193 221    Recent Labs Lab 07/04/15 1029 07/05/15 0426 07/06/15 0558  NA 139 141 142  K 4.5 3.9 3.9  CL 111 116* 116*  CO2 24 22 22   BUN 66* 40* 14  CREATININE 0.90 0.81 0.71  CALCIUM 8.4* 7.8* 7.6*  PROT 5.3*  --   --   BILITOT 0.3  --   --   ALKPHOS 42  --   --   ALT 11*  --   --   AST 15  --   --   GLUCOSE 124* 106* 112*   Medications: I have reviewed the patient's current medications. Scheduled Meds: . sodium chloride   Intravenous Once  . diclofenac sodium  2 g Topical QID  . pantoprazole  40 mg Oral BID   Continuous Infusions: . sodium chloride     Assessment/Plan: Ms Fornes is an 82 year-old female with a history of GERD, AAA, and CVA on prophylactic DAPT in the setting of recent initiation of NSAIDs for OA who presented with new onset shortness of breath, dizziness, and  fatigue. In the ED she was found to have a Hgb of 7.5 with a positive FOBT.   # Acute symptomatic anemia secondary to GI bleed: She recently stopped her lansoprazole and was started on meloxicam for OA while still taking Asprin 325 and clopidogrel for secondary CVA prophylaxis. Hgb in the ED was 7.5, down from 13.1 in April 2017. Received 2 units pRBCs to date. EGD 6/5 showed minimal antral gastritis without evidence of active bleeding. Colonoscopy 6/6 failed secondary to remaining stool in the rectum and sigmoid colon. Colonoscopy 6/8 significant for multiple sigmoid diverticula without any evidence of bleeding.  Hgb stable at 7.8. Gastritis secondary to NSAID use in the setting of dual antiplatelet therapy most likely cause of bleeding.   - Discontinue home aspirin.  Restart clopidogrel later this week at home - Stop home meloxicam - Pantoprazole 40 mg PO daily at home  # Hypertension: Soft BPs in the [id number] to [id number] systolic and 0000000 diastolic. Symptoms and blood pressure have remained stable - Restart home losartan on discharge.    # Knee OA:  Recently switched to meloxicam 15 mg daily 2 weeks ago.  - Stop meloxicam as above - Tylenol extra strength as needed at home  This is a Careers information officer Note.  The care of the patient was discussed with Dr. Lovena Le and the assessment and plan formulated with their assistance.  Please see their attached note for official documentation of the daily encounter.    LOS: 5 days   Jobie Quaker, Med Student 07/09/2015, 11:30 AM

## 2015-07-09 NOTE — Progress Notes (Signed)
Pt.had a bm & it's still not clear ,grayish to blackish in color.Dr.Hayes was called & made aware & ordered to give fleets enema .

## 2015-07-09 NOTE — Discharge Instructions (Signed)
1. STOP taking Aspirin and Meloxicam. Avoid other NSAIDs (Advil, Aleve, Ibuprofen, Naproxen).  It is OK to take Tylenol. 2. Take Pantoprazole (Protonix) 40 mg daily. 3. Apply Voltaren gel up to FOUR times daily as needed for knee pain. 4. Take Ferrous Sulfate (Iron) 1 tab ONCE daily for iron supplementation. 5. Good medications for constipation are Benefiber and Miralax.  They are both over the counter.

## 2015-07-09 NOTE — Anesthesia Postprocedure Evaluation (Signed)
Anesthesia Post Note  Patient: Michelle [name]  Procedure(s) Performed: Procedure(s) (LRB): COLONOSCOPY WITH PROPOFOL (N/A)  Patient location during evaluation: PACU Anesthesia Type: MAC Level of consciousness: awake and alert Pain management: pain level controlled Vital Signs Assessment: post-procedure vital signs reviewed and stable Respiratory status: spontaneous breathing, nonlabored ventilation, respiratory function stable and patient connected to nasal cannula oxygen Cardiovascular status: stable and blood pressure returned to baseline Anesthetic complications: no    Last Vitals:  Filed Vitals:   07/09/15 0930 07/09/15 0940  BP: 120/39 131/52  Pulse: 59 60  Temp:    Resp: 10 13    Last Pain:  Filed Vitals:   07/09/15 0941  PainSc: 0-No pain                 Michelle [name]

## 2015-07-09 NOTE — Transfer of Care (Signed)
Immediate Anesthesia Transfer of Care Note  Patient: Michelle [name]  Procedure(s) Performed: Procedure(s): COLONOSCOPY WITH PROPOFOL (N/A)  Patient Location: PACU  Anesthesia Type:MAC  Level of Consciousness: awake, alert  and oriented  Airway & Oxygen Therapy: Patient Spontanous Breathing and Patient connected to face mask oxygen  Post-op Assessment: Report given to RN, Post -op Vital signs reviewed and stable and Patient moving all extremities  Post vital signs: Reviewed and stable  Last Vitals:  Filed Vitals:   07/09/15 0528 07/09/15 0846  BP: 102/47 139/60  Pulse: 60 75  Temp: 36.7 C 36.8 C  Resp: 16 11    Last Pain:  Filed Vitals:   07/09/15 0851  PainSc: 0-No pain         Complications: No apparent anesthesia complications

## 2015-07-09 NOTE — Progress Notes (Signed)
Physical Therapy Treatment Patient Details Name: Michelle [name] MRN: [id number] DOB: [birth year] Today's Date: 07/09/2015    History of Present Illness Pt is an 82 y/o female who presents with symptomatic anemia x2 days. Other symptoms included fatigue, dizziness, and DOE.     PT Comments    Patient required Min A (HHA) for ambulation. Pt reported feeling weak and unsteady when standing. LOB to R side with horizontal head turns. Current plan remains appropriate.   Follow Up Recommendations  Supervision for mobility/OOB;Home health PT     Equipment Recommendations  None recommended by PT    Recommendations for Other Services       Precautions / Restrictions Precautions Precautions: Fall Restrictions Weight Bearing Restrictions: No    Mobility  Bed Mobility Overal bed mobility: Modified Independent             General bed mobility comments: increased time  Transfers Overall transfer level: Needs assistance Equipment used: None Transfers: Sit to/from Stand Sit to Stand: Min guard         General transfer comment: min guard for safety; pt c/o feeling unsteady and weak but no dizziness upon standing  Ambulation/Gait Ambulation/Gait assistance: Min assist Ambulation Distance (Feet): 100 Feet Assistive device: 1 person hand held assist Gait Pattern/deviations: Step-through pattern;Decreased stride length;Drifts right/left Gait velocity: Decreased   General Gait Details: assist for balance and cues for cadence, bilat step length, and forward gaze   Stairs            Wheelchair Mobility    Modified Rankin (Stroke Patients Only)       Balance Overall balance assessment: Needs assistance Sitting-balance support: No upper extremity supported;Feet supported Sitting balance-Leahy Scale: Good     Standing balance support: No upper extremity supported Standing balance-Leahy Scale: Fair               High level balance activites: Side  stepping;Backward walking;Head turns High Level Balance Comments: moderate gait deviations with horizontal head turns with LOB to R side; recovered with min A    Cognition Arousal/Alertness: Awake/alert Behavior During Therapy: WFL for tasks assessed/performed Overall Cognitive Status: Within Functional Limits for tasks assessed                      Exercises      General Comments        Pertinent Vitals/Pain Pain Assessment: No/denies pain    Home Living                      Prior Function            PT Goals (current goals can now be found in the care plan section) Acute Rehab PT Goals Patient Stated Goal: Back to PLOF PT Goal Formulation: With patient/family Time For Goal Achievement: 07/12/15 Potential to Achieve Goals: Good Progress towards PT goals: Progressing toward goals    Frequency  Min 3X/week    PT Plan Current plan remains appropriate    Co-evaluation             End of Session Equipment Utilized During Treatment: Gait belt Activity Tolerance: Patient tolerated treatment well Patient left: in bed;with call bell/phone within reach;with bed alarm set;with family/visitor present     Time: 1141-1200 PT Time Calculation (min) (ACUTE ONLY): 19 min  Charges:  $Gait Training: 8-22 mins                    G  Codes:      Salina April, PTA Pager: [phone]   07/09/2015, 12:09 PM

## 2015-07-09 NOTE — Progress Notes (Addendum)
Subjective: NAEON.  She denies CP or SOB.  She is happy to go home.   Objective: Vital signs in last 24 hours: Filed Vitals:   07/09/15 0528 07/09/15 0846 07/09/15 0930 07/09/15 0940  BP: 102/47 139/60 120/39 131/52  Pulse: 60 75 59 60  Temp: 98.1 F (36.7 C) 98.3 F (36.8 C)    TempSrc: Oral Oral    Resp: 16 11 10 13   Height:      Weight:      SpO2: 100% 98% 100% 100%   Weight change:   Intake/Output Summary (Last 24 hours) at 07/09/15 1126 Last data filed at 07/09/15 0930  Gross per 24 hour  Intake    658 ml  Output      0 ml  Net    658 ml   Physical Exam  Constitutional: She is oriented to person, place, and time. No distress.  HENT:  Head: Normocephalic and atraumatic.  Eyes: EOM are normal. No scleral icterus.  Neck: No tracheal deviation present.  Cardiovascular: Normal rate, regular rhythm and normal heart sounds.   Pulmonary/Chest: Effort normal and breath sounds normal. No stridor. No respiratory distress. She has no wheezes. She has no rales.  Abdominal: Soft. Bowel sounds are normal. She exhibits no distension. There is no rebound and no guarding.  Nontender to palpation.  Musculoskeletal: She exhibits no edema.  Neurological: She is alert and oriented to person, place, and time.  Skin: Skin is warm and dry. She is not diaphoretic.  Pallor improving.    Lab Results: Basic Metabolic Panel:  Recent Labs Lab 07/05/15 0426 07/06/15 0558  NA 141 142  K 3.9 3.9  CL 116* 116*  CO2 22 22  GLUCOSE 106* 112*  BUN 40* 14  CREATININE 0.81 0.71  CALCIUM 7.8* 7.6*   Liver Function Tests:  Recent Labs Lab 07/04/15 1029  AST 15  ALT 11*  ALKPHOS 42  BILITOT 0.3  PROT 5.3*  ALBUMIN 2.9*   No results for input(s): LIPASE, AMYLASE in the last 168 hours. No results for input(s): AMMONIA in the last 168 hours. CBC:  Recent Labs Lab 07/04/15 1029  07/08/15 0538 07/09/15 0450  WBC 9.5  < > 6.3 6.8  NEUTROABS 7.1  --   --   --   HGB 7.5*  < >  7.8* 7.9*  HCT 23.1*  < > 24.1* 23.7*  MCV 97.5  < > 91.6 91.9  PLT 212  < > 193 221  < > = values in this interval not displayed. Cardiac Enzymes: No results for input(s): CKTOTAL, CKMB, CKMBINDEX, TROPONINI in the last 168 hours. BNP: No results for input(s): PROBNP in the last 168 hours. D-Dimer: No results for input(s): DDIMER in the last 168 hours. CBG: No results for input(s): GLUCAP in the last 168 hours. Hemoglobin A1C: No results for input(s): HGBA1C in the last 168 hours. Fasting Lipid Panel: No results for input(s): CHOL, HDL, LDLCALC, TRIG, CHOLHDL, LDLDIRECT in the last 168 hours. Thyroid Function Tests: No results for input(s): TSH, T4TOTAL, FREET4, T3FREE, THYROIDAB in the last 168 hours. Coagulation: No results for input(s): LABPROT, INR in the last 168 hours. Anemia Panel:  Recent Labs Lab 07/04/15 1432  FERRITIN 35  TIBC 249*  IRON 179*  RETICCTPCT 1.6   Urine Drug Screen: Drugs of Abuse  No results found for: LABOPIA, COCAINSCRNUR, LABBENZ, AMPHETMU, THCU, LABBARB  Alcohol Level: No results for input(s): ETH in the last 168 hours. Urinalysis: No results for  input(s): COLORURINE, LABSPEC, PHURINE, GLUCOSEU, HGBUR, BILIRUBINUR, KETONESUR, PROTEINUR, UROBILINOGEN, NITRITE, LEUKOCYTESUR in the last 168 hours.  Invalid input(s): APPERANCEUR Misc. Labs:   Micro Results: No results found for this or any previous visit (from the past 240 hour(s)). Studies/Results: No results found. Medications: I have reviewed the patient's current medications. Scheduled Meds: . sodium chloride   Intravenous Once  . diclofenac sodium  2 g Topical QID  . pantoprazole  40 mg Oral BID   Continuous Infusions: . sodium chloride     PRN Meds:. Assessment/Plan: Principal Problem:   Upper GI bleed Active Problems:   Essential hypertension, benign   History of CVA (cerebrovascular accident)   GERD   History of breast cancer in female   History of endometrial cancer    Aneurysm of infrarenal abdominal aorta (HCC)   Osteoporosis   Symptomatic anemia   Iron deficiency anemia, unspecified  Ms. Hulen is an 82 yo female with hiatal hernia, OA, HTN, HLD, 3 cm AAA, and h/o CVA, presenting with 2 day h/o dizziness, fatigue, and DOE.   Symptomatic Anemia 2/2 GI Bleed, improving: Patient with h/o hiatal hernia and GERD on ASA 325mg  and Plavix presents with 6g hemoglobin drop, orthostasis, fatigue, DOE, and positive FOBT after starting Meloxicam 15 mg daily and stopping Lansaprazole therapy. EGD showed minimal gastritis without evidence of active bleeding.  Colonoscopy today shows diverticulosis without signs of bleeding.  Presumed bleeding source remains UGI 2/2 gastritis. Patient may resume Plavix in a few days to prevent recurrent stroke - GI consult, appreciate rec's - Protonix 40 mg PO BID.  Discharge on Omeprazole 40 mg daily - HOLD ASA/Plavix - STOP Meloxicam - Teley - s/p 2 U pRBC  HTN: Patient orthostatic on presentation, but otherwise hemodynamically stable. Will hold BP meds and monitor. - HOLD Losartan  Knee OA: Patient with b/l knee pain 2/2 OA for which she was prescribed Meloxicam. Patient understands role of NSAIDs in PUD and is agreeable to stopping therapy. - Voltaren gel - Tylenol  HLD: Atorvastatin 40 mg  FEN/GI: - Regular  DVT Ppx: SCDs  Dispo: Disposition is deferred at this time, awaiting improvement of current medical problems.  Anticipated discharge in approximately 2-3 day(s).   The patient does have a current PCP Hali Marry, MD) and does need an [facility] hospital follow-up appointment after discharge.  The patient does not have transportation limitations that hinder transportation to clinic appointments.  .Services Needed at time of discharge: Y = Yes, Blank = No PT:   OT:   RN:   Equipment:   Other:     LOS: 5 days   Iline Oven, MD 07/09/2015, 11:26 AM

## 2015-07-09 NOTE — Care Management Note (Signed)
Case Management Note  Patient Details  Name: Michelle [name] MRN: [id number] Date of Birth: [birth year]  Subjective/Objective:                 SPoke with patient in the room. She states she is from home with her husband of 56 years. They both still drive. She denies needing medication assistance or resources.     Action/Plan:    Patient to DC to home with spouse, Referral made to [facility] for [facility] PT.  Expected Discharge Date:                  Expected Discharge Plan:  [place]  In-House Referral:     Discharge planning Services  CM Consult  Post Acute Care Choice:  Home Health Choice offered to:  Patient  DME Arranged:    DME Agency:     HH Arranged:  PT [place]:  Manuel Garcia  Status of Service:  Completed, signed off  Medicare Important Message Given:  Yes Date Medicare IM Given:    Medicare IM give by:    Date Additional Medicare IM Given:    Additional Medicare Important Message give by:     If discussed at [place] of Stay Meetings, dates discussed:    Additional Comments:  Carles Collet, RN 07/09/2015, 1:34 PM

## 2015-07-09 NOTE — Anesthesia Procedure Notes (Signed)
Procedure Name: MAC Date/Time: 07/09/2015 9:01 AM Performed by: Kyung Rudd Pre-anesthesia Checklist: Patient identified, Emergency Drugs available, Suction available, Patient being monitored and Timeout performed Patient Re-evaluated:Patient Re-evaluated prior to inductionOxygen Delivery Method: Simple face mask Intubation Type: IV induction Placement Confirmation: positive ETCO2 and CO2 detector

## 2015-07-10 ENCOUNTER — Encounter (HOSPITAL_COMMUNITY): Payer: Self-pay | Admitting: Gastroenterology

## 2015-07-13 DIAGNOSIS — D509 Iron deficiency anemia, unspecified: Secondary | ICD-10-CM | POA: Diagnosis not present

## 2015-07-13 DIAGNOSIS — Z853 Personal history of malignant neoplasm of breast: Secondary | ICD-10-CM | POA: Diagnosis not present

## 2015-07-13 DIAGNOSIS — I10 Essential (primary) hypertension: Secondary | ICD-10-CM | POA: Diagnosis not present

## 2015-07-13 DIAGNOSIS — K219 Gastro-esophageal reflux disease without esophagitis: Secondary | ICD-10-CM | POA: Diagnosis not present

## 2015-07-13 DIAGNOSIS — Z8673 Personal history of transient ischemic attack (TIA), and cerebral infarction without residual deficits: Secondary | ICD-10-CM | POA: Diagnosis not present

## 2015-07-13 DIAGNOSIS — Z8544 Personal history of malignant neoplasm of other female genital organs: Secondary | ICD-10-CM | POA: Diagnosis not present

## 2015-07-13 DIAGNOSIS — I714 Abdominal aortic aneurysm, without rupture: Secondary | ICD-10-CM | POA: Diagnosis not present

## 2015-07-13 DIAGNOSIS — M150 Primary generalized (osteo)arthritis: Secondary | ICD-10-CM | POA: Diagnosis not present

## 2015-07-13 DIAGNOSIS — K922 Gastrointestinal hemorrhage, unspecified: Secondary | ICD-10-CM | POA: Diagnosis not present

## 2015-07-15 ENCOUNTER — Encounter: Payer: Medicare Other | Admitting: Physical Therapy

## 2015-07-15 DIAGNOSIS — D509 Iron deficiency anemia, unspecified: Secondary | ICD-10-CM | POA: Diagnosis not present

## 2015-07-15 DIAGNOSIS — Z8544 Personal history of malignant neoplasm of other female genital organs: Secondary | ICD-10-CM | POA: Diagnosis not present

## 2015-07-15 DIAGNOSIS — K922 Gastrointestinal hemorrhage, unspecified: Secondary | ICD-10-CM | POA: Diagnosis not present

## 2015-07-15 DIAGNOSIS — Z8673 Personal history of transient ischemic attack (TIA), and cerebral infarction without residual deficits: Secondary | ICD-10-CM | POA: Diagnosis not present

## 2015-07-15 DIAGNOSIS — I714 Abdominal aortic aneurysm, without rupture: Secondary | ICD-10-CM | POA: Diagnosis not present

## 2015-07-15 DIAGNOSIS — M150 Primary generalized (osteo)arthritis: Secondary | ICD-10-CM | POA: Diagnosis not present

## 2015-07-15 DIAGNOSIS — K219 Gastro-esophageal reflux disease without esophagitis: Secondary | ICD-10-CM | POA: Diagnosis not present

## 2015-07-15 DIAGNOSIS — I10 Essential (primary) hypertension: Secondary | ICD-10-CM | POA: Diagnosis not present

## 2015-07-15 DIAGNOSIS — Z853 Personal history of malignant neoplasm of breast: Secondary | ICD-10-CM | POA: Diagnosis not present

## 2015-07-21 DIAGNOSIS — K219 Gastro-esophageal reflux disease without esophagitis: Secondary | ICD-10-CM | POA: Diagnosis not present

## 2015-07-21 DIAGNOSIS — D509 Iron deficiency anemia, unspecified: Secondary | ICD-10-CM | POA: Diagnosis not present

## 2015-07-21 DIAGNOSIS — Z8673 Personal history of transient ischemic attack (TIA), and cerebral infarction without residual deficits: Secondary | ICD-10-CM | POA: Diagnosis not present

## 2015-07-21 DIAGNOSIS — Z8544 Personal history of malignant neoplasm of other female genital organs: Secondary | ICD-10-CM | POA: Diagnosis not present

## 2015-07-21 DIAGNOSIS — Z853 Personal history of malignant neoplasm of breast: Secondary | ICD-10-CM | POA: Diagnosis not present

## 2015-07-21 DIAGNOSIS — K922 Gastrointestinal hemorrhage, unspecified: Secondary | ICD-10-CM | POA: Diagnosis not present

## 2015-07-21 DIAGNOSIS — M150 Primary generalized (osteo)arthritis: Secondary | ICD-10-CM | POA: Diagnosis not present

## 2015-07-21 DIAGNOSIS — I10 Essential (primary) hypertension: Secondary | ICD-10-CM | POA: Diagnosis not present

## 2015-07-21 DIAGNOSIS — I714 Abdominal aortic aneurysm, without rupture: Secondary | ICD-10-CM | POA: Diagnosis not present

## 2015-07-23 DIAGNOSIS — Z8544 Personal history of malignant neoplasm of other female genital organs: Secondary | ICD-10-CM | POA: Diagnosis not present

## 2015-07-23 DIAGNOSIS — K219 Gastro-esophageal reflux disease without esophagitis: Secondary | ICD-10-CM | POA: Diagnosis not present

## 2015-07-23 DIAGNOSIS — M150 Primary generalized (osteo)arthritis: Secondary | ICD-10-CM | POA: Diagnosis not present

## 2015-07-23 DIAGNOSIS — D509 Iron deficiency anemia, unspecified: Secondary | ICD-10-CM | POA: Diagnosis not present

## 2015-07-23 DIAGNOSIS — K922 Gastrointestinal hemorrhage, unspecified: Secondary | ICD-10-CM | POA: Diagnosis not present

## 2015-07-23 DIAGNOSIS — I714 Abdominal aortic aneurysm, without rupture: Secondary | ICD-10-CM | POA: Diagnosis not present

## 2015-07-23 DIAGNOSIS — I10 Essential (primary) hypertension: Secondary | ICD-10-CM | POA: Diagnosis not present

## 2015-07-23 DIAGNOSIS — K921 Melena: Secondary | ICD-10-CM | POA: Diagnosis not present

## 2015-07-23 DIAGNOSIS — Z8673 Personal history of transient ischemic attack (TIA), and cerebral infarction without residual deficits: Secondary | ICD-10-CM | POA: Diagnosis not present

## 2015-07-23 DIAGNOSIS — Z853 Personal history of malignant neoplasm of breast: Secondary | ICD-10-CM | POA: Diagnosis not present

## 2015-07-28 ENCOUNTER — Ambulatory Visit: Payer: Medicare Other | Admitting: Sports Medicine

## 2015-07-29 ENCOUNTER — Encounter: Payer: Medicare Other | Admitting: Physical Therapy

## 2015-07-30 ENCOUNTER — Ambulatory Visit (INDEPENDENT_AMBULATORY_CARE_PROVIDER_SITE_OTHER): Payer: Medicare Other | Admitting: Family Medicine

## 2015-07-30 ENCOUNTER — Encounter: Payer: Self-pay | Admitting: Family Medicine

## 2015-07-30 VITALS — BP 127/64 | HR 57

## 2015-07-30 DIAGNOSIS — K922 Gastrointestinal hemorrhage, unspecified: Secondary | ICD-10-CM | POA: Diagnosis not present

## 2015-07-30 DIAGNOSIS — M810 Age-related osteoporosis without current pathological fracture: Secondary | ICD-10-CM | POA: Diagnosis not present

## 2015-07-30 DIAGNOSIS — I10 Essential (primary) hypertension: Secondary | ICD-10-CM | POA: Diagnosis not present

## 2015-07-30 LAB — CBC WITH DIFFERENTIAL/PLATELET
BASOS PCT: 1 %
Basophils Absolute: 74 cells/uL (ref 0–200)
EOS ABS: 296 {cells}/uL (ref 15–500)
Eosinophils Relative: 4 %
HCT: 33.1 % — ABNORMAL LOW (ref 35.0–45.0)
HEMOGLOBIN: 10.6 g/dL — AB (ref 11.7–15.5)
LYMPHS PCT: 27 %
Lymphs Abs: 1998 cells/uL (ref 850–3900)
MCH: 31 pg (ref 27.0–33.0)
MCHC: 32 g/dL (ref 32.0–36.0)
MCV: 96.8 fL (ref 80.0–100.0)
MONO ABS: 666 {cells}/uL (ref 200–950)
MPV: 9.5 fL (ref 7.5–12.5)
Monocytes Relative: 9 %
Neutro Abs: 4366 cells/uL (ref 1500–7800)
Neutrophils Relative %: 59 %
Platelets: 312 10*3/uL (ref 140–400)
RBC: 3.42 MIL/uL — ABNORMAL LOW (ref 3.80–5.10)
RDW: 16.4 % — AB (ref 11.0–15.0)
WBC: 7.4 10*3/uL (ref 3.8–10.8)

## 2015-07-30 NOTE — Patient Instructions (Signed)
Denosumab injection  What is this medicine?  DENOSUMAB (den oh sue mab) slows bone breakdown. Prolia is used to treat osteoporosis in women after menopause and in men. Xgeva is used to prevent bone fractures and other bone problems caused by cancer bone metastases. Xgeva is also used to treat giant cell tumor of the bone.  This medicine may be used for other purposes; ask your health care provider or pharmacist if you have questions.  What should I tell my health care provider before I take this medicine?  They need to know if you have any of these conditions:  -dental disease  -eczema  -infection or history of infections  -kidney disease or on dialysis  -low blood calcium or vitamin D  -malabsorption syndrome  -scheduled to have surgery or tooth extraction  -taking medicine that contains denosumab  -thyroid or parathyroid disease  -an unusual reaction to denosumab, other medicines, foods, dyes, or preservatives  -pregnant or trying to get pregnant  -breast-feeding  How should I use this medicine?  This medicine is for injection under the skin. It is given by a health care professional in a hospital or clinic setting.  If you are getting Prolia, a special MedGuide will be given to you by the pharmacist with each prescription and refill. Be sure to read this information carefully each time.  For Prolia, talk to your pediatrician regarding the use of this medicine in children. Special care may be needed. For Xgeva, talk to your pediatrician regarding the use of this medicine in children. While this drug may be prescribed for children as young as 13 years for selected conditions, precautions do apply.  Overdosage: If you think you have taken too much of this medicine contact a poison control center or emergency room at once.  NOTE: This medicine is only for you. Do not share this medicine with others.  What if I miss a dose?  It is important not to miss your dose. Call your doctor or health care professional if you are  unable to keep an appointment.  What may interact with this medicine?  Do not take this medicine with any of the following medications:  -other medicines containing denosumab  This medicine may also interact with the following medications:  -medicines that suppress the immune system  -medicines that treat cancer  -steroid medicines like prednisone or cortisone  This list may not describe all possible interactions. Give your health care provider a list of all the medicines, herbs, non-prescription drugs, or dietary supplements you use. Also tell them if you smoke, drink alcohol, or use illegal drugs. Some items may interact with your medicine.  What should I watch for while using this medicine?  Visit your doctor or health care professional for regular checks on your progress. Your doctor or health care professional may order blood tests and other tests to see how you are doing.  Call your doctor or health care professional if you get a cold or other infection while receiving this medicine. Do not treat yourself. This medicine may decrease your body's ability to fight infection.  You should make sure you get enough calcium and vitamin D while you are taking this medicine, unless your doctor tells you not to. Discuss the foods you eat and the vitamins you take with your health care professional.  See your dentist regularly. Brush and floss your teeth as directed. Before you have any dental work done, tell your dentist you are receiving this medicine.  Do   not become pregnant while taking this medicine or for 5 months after stopping it. Women should inform their doctor if they wish to become pregnant or think they might be pregnant. There is a potential for serious side effects to an unborn child. Talk to your health care professional or pharmacist for more information.  What side effects may I notice from receiving this medicine?  Side effects that you should report to your doctor or health care professional as soon as  possible:  -allergic reactions like skin rash, itching or hives, swelling of the face, lips, or tongue  -breathing problems  -chest pain  -fast, irregular heartbeat  -feeling faint or lightheaded, falls  -fever, chills, or any other sign of infection  -muscle spasms, tightening, or twitches  -numbness or tingling  -skin blisters or bumps, or is dry, peels, or red  -slow healing or unexplained pain in the mouth or jaw  -unusual bleeding or bruising  Side effects that usually do not require medical attention (Report these to your doctor or health care professional if they continue or are bothersome.):  -muscle pain  -stomach upset, gas  This list may not describe all possible side effects. Call your doctor for medical advice about side effects. You may report side effects to FDA at 1-800-FDA-1088.  Where should I keep my medicine?  This medicine is only given in a clinic, doctor's office, or other health care setting and will not be stored at home.  NOTE: This sheet is a summary. It may not cover all possible information. If you have questions about this medicine, talk to your doctor, pharmacist, or health care provider.      2016, Elsevier/Gold Standard. (2011-07-18 12:37:47)

## 2015-07-30 NOTE — Progress Notes (Signed)
Subjective:    CC: Hospital follow-up.  HPI:  82 year old female who is here today to follow-up on a GI bleed. She was previously on Plavix and aspirin and and recently an oral NSAID was added for a musculoskeletal issue. She was not on a PPI. This unfortunately caused a GI bleed. She was negative to: Hospital on June 3 and discharged home 5 days later on June 8.  She did receive 2 units of packed red blood cells. All NSAIDs were stopped and her aspirin was discontinued at discharge. They did encourage her to continue her Plavix and they added a PPI.  She is overall feeling much better today. She was able to just 3 g. She did have to take a break in between that she's doing much better. Her shortness of breath is improved. She just fatigues easily. She is taking an iron tablet a day and did discontinue her aspirin and her meloxicam  Osteoporosis-she wanted to discuss her Fosamax. She says every Wednesday when she takes it she just dreads it because it tends to make her feel bad and causes her to have more heartburn and reflux afterwards. She also feels like it's been constipating her. She's become constipated since starting the medication last fall. She wants to know if there are any alternatives and wonders if it could have increased her risk for GI bleed as well.  Hypertension- Pt denies chest pain, SOB, dizziness, or heart palpitations.  Taking meds as directed w/o problems.  Denies medication side effects.      Past medical history, Surgical history, Family history not pertinant except as noted below, Social history, Allergies, and medications have been entered into the medical record, reviewed, and corrections made.   Review of Systems: No fevers, chills, night sweats, weight loss, chest pain, or shortness of breath.   Objective:    General: Well Developed, well nourished, and in no acute distress.  Neuro: Alert and oriented x3, extra-ocular muscles intact, sensation grossly intact.   HEENT: Normocephalic, atraumatic  Skin: Warm and dry, no rashes. Cardiac: Regular rate and rhythm, no murmurs rubs or gallops, no lower extremity edema.  Respiratory: Clear to auscultation bilaterally. Not using accessory muscles, speaking in full sentences.   Impression and Recommendations:   GI bleed-she is still having some dark stools. Will recheck hemoglobin today. Continue with PPI. Continue to hold aspirin and any NSAIDs. If hemoglobin is coming up and plan will be to repeat hemoglobin and iron in about 3 months. She will also need evaluation for hemochromatosis.  Osteoporosis-we discussed options. Consider switching to IV Reclast once year versus Prolia. Additional information provided. She will think about it.  Stop fosamax for now.   HTN - well controlled.

## 2015-08-05 ENCOUNTER — Other Ambulatory Visit: Payer: Self-pay | Admitting: Family Medicine

## 2015-08-06 ENCOUNTER — Telehealth: Payer: Self-pay

## 2015-08-06 NOTE — Telephone Encounter (Signed)
The diclofenac Votaren gel needs a prior authorization. She uses the medication for osteoarthritis in both knees. She has tried meloxicam and had a GI bleed, Tylenol without relief and Bengay without relief. She cannot take ibuprofen it upsets her stomach. The phone number for the PA is [phone]. Thanks for your help.

## 2015-08-11 ENCOUNTER — Encounter: Payer: Medicare Other | Admitting: Physical Therapy

## 2015-08-14 MED ORDER — DICLOFENAC SODIUM 2 % TD SOLN: 2.0000 | Freq: Two times a day (BID) | TRANSDERMAL | Status: DC

## 2015-08-14 NOTE — Telephone Encounter (Signed)
Yes, but I'm going to do it in the formulation of Pennsaid and send it to [facility]

## 2015-08-14 NOTE — Telephone Encounter (Signed)
Our office did not prescribe this medication. It was prescribed by someone most likely at the hospital.  Dr. Darene Lamer can you write a prescription for diclofenac gel for this patient if it agreeable so that I can submit a PA? She has seen you for osteoarthritis in the knee

## 2015-08-14 NOTE — Telephone Encounter (Signed)
Notified patient and gave her the # to the pharm

## 2015-08-25 ENCOUNTER — Encounter: Payer: Medicare Other | Admitting: Physical Therapy

## 2015-08-28 DIAGNOSIS — I10 Essential (primary) hypertension: Secondary | ICD-10-CM | POA: Diagnosis not present

## 2015-08-28 DIAGNOSIS — D509 Iron deficiency anemia, unspecified: Secondary | ICD-10-CM | POA: Diagnosis not present

## 2015-08-28 DIAGNOSIS — M150 Primary generalized (osteo)arthritis: Secondary | ICD-10-CM | POA: Diagnosis not present

## 2015-08-28 DIAGNOSIS — K922 Gastrointestinal hemorrhage, unspecified: Secondary | ICD-10-CM | POA: Diagnosis not present

## 2015-09-07 ENCOUNTER — Other Ambulatory Visit: Payer: Self-pay | Admitting: Internal Medicine

## 2015-09-08 ENCOUNTER — Other Ambulatory Visit: Payer: Self-pay | Admitting: Family Medicine

## 2015-09-09 ENCOUNTER — Other Ambulatory Visit: Payer: Self-pay | Admitting: Family Medicine

## 2015-09-14 ENCOUNTER — Encounter: Payer: Self-pay | Admitting: Obstetrics & Gynecology

## 2015-09-17 DIAGNOSIS — M25461 Effusion, right knee: Secondary | ICD-10-CM | POA: Diagnosis not present

## 2015-09-17 DIAGNOSIS — M25561 Pain in right knee: Secondary | ICD-10-CM | POA: Diagnosis not present

## 2015-09-23 ENCOUNTER — Encounter: Payer: Self-pay | Admitting: Physical Therapy

## 2015-09-23 ENCOUNTER — Ambulatory Visit: Payer: Medicare Other | Attending: Sports Medicine | Admitting: Physical Therapy

## 2015-09-23 DIAGNOSIS — M6281 Muscle weakness (generalized): Secondary | ICD-10-CM | POA: Diagnosis not present

## 2015-09-23 DIAGNOSIS — R262 Difficulty in walking, not elsewhere classified: Secondary | ICD-10-CM

## 2015-09-23 DIAGNOSIS — R2689 Other abnormalities of gait and mobility: Secondary | ICD-10-CM | POA: Diagnosis not present

## 2015-09-23 DIAGNOSIS — M25561 Pain in right knee: Secondary | ICD-10-CM

## 2015-09-23 NOTE — Therapy (Signed)
[place], [place], [address] Phone: [phone]   Fax:  [phone]  Physical Therapy Evaluation  Patient Details  Name: Michelle [name] MRN: [id number] Date of Birth: 1932-08-05 Referring Provider: Dr Delilah Shan   Encounter Date: 09/23/2015      PT End of Session - 09/23/15 1742    Visit Number 1   Number of Visits 16   Date for PT Re-Evaluation 11/18/15   Authorization Type UHC medicare    Activity Tolerance Patient tolerated treatment well   Behavior During Therapy [facility] for tasks assessed/performed      Past Medical History:  Diagnosis Date  . AAA (abdominal aortic aneurysm) ([place]) 10/2013  . Basal cell carcinoma 2011   R back  . Bradycardia   . Breast cancer ([place]) 02/2011   US-guided biopsy  . Cataract   . Endometrial cancer ([place]) 05/2013   uterine ca  . GERD (gastroesophageal reflux disease)   . Hiatal hernia 2008  . History of breast cancer   . History of cerebrovascular accident 2009  . Hyperlipidemia   . Hypertension 2012  . Macular degeneration   . Polyp of colon   . Shingles 2008  . Squamous cell carcinoma ([place])    2010-2015  . Stroke ([place]) 09/2007   Slight memory problems residual  . Tendonitis    rt hand  . Vocal cord polyp     Past Surgical History:  Procedure Laterality Date  . BASAL CELL CARCINOMA EXCISION    . biospy  10/28/09   Shave Biospy skin Left hand(Acitinic Keratoses), Right Upper Arm (superficial Basal Cell), Right Upper Back(Superficial Basal Cell), Left Neck ( Solar Lentigo & Seborrheic Keratoses)  . BREAST BIOPSY  02/11/11   Right Breast Needle Core Biospy - Upper Outer Quadrant; ER/PR 100%, Her-2 Neu neg.; Ki-67 10%  . BREAST MAMMOSITE  03/14/2011   Procedure: MAMMOSITE BREAST;  Surgeon: Rolm Bookbinder, MD;  Location: [facility];  Service: General;  Laterality: Right;  . BREAST SURGERY     right breast lumpectomy snbx  . CHOLECYSTECTOMY  2004   . COLONOSCOPY    . COLONOSCOPY W/ POLYPECTOMY     2008, 2014 (benign)  . COLONOSCOPY WITH PROPOFOL N/A 07/09/2015   Procedure: COLONOSCOPY WITH PROPOFOL;  Surgeon: Wonda Horner, MD;  [facility] ENDOSCOPY;  Service: Endoscopy;  Laterality: N/A;  . DILATION AND CURETTAGE OF UTERUS N/A 04/24/2013   Procedure: DILATATION AND CURETTAGE;  Surgeon: Guss Bunde, MD;  Location: [facility];  Service: Gynecology;  Laterality: N/A;  . ESOPHAGOGASTRODUODENOSCOPY N/A 07/06/2015   Procedure: ESOPHAGOGASTRODUODENOSCOPY (EGD);  Surgeon: Wilford Corner, MD;  Location: [facility] ENDOSCOPY;  Service: Endoscopy;  Laterality: N/A;  . HYSTEROSCOPY N/A 04/24/2013   Procedure: HYSTEROSCOPY;  Surgeon: Guss Bunde, MD;  Location: [facility];  Service: Gynecology;  Laterality: N/A;  . ROBOTIC ASSISTED TOTAL HYSTERECTOMY WITH BILATERAL SALPINGO OOPHERECTOMY Bilateral 06/11/2013   Procedure: ROBOTIC ASSISTED TOTAL HYSTERECTOMY WITH BILATERAL SALPINGO OOPHORECTOMY WITH POSSIBLE STAGING, EPISIOTOMY;  Surgeon: Janie Morning, MD;  Location: [facility];  Service: Gynecology;  Laterality: Bilateral;  . SQUAMOUS CELL CARCINOMA EXCISION    . TEAR DUCT PROBING    . TONSILLECTOMY    . vocal cord poylps  1969   excision    There were no vitals filed for this visit.       Subjective Assessment - 09/23/15 1516    Subjective Patient reports a history of knee pain beggining in January of 2017.  About 3 months ago she fell on both knees and had an exacerbation of her symptoms. She had started therapy back in June but had problems with GI bleeding. She had to stop. she reported feeling too fatigued to do her exercises after that.     Patient is accompained by: Family member   Pertinent History GI bleed low HGB    How long can you sit comfortably? No limit    How long can you stand comfortably? No limit    How long can you walk comfortably? limited community distances.    Diagnostic tests x-RAYS (06/2015) Degenration in both knees with spuring  on the right.    Patient Stated Goals To decrease pain with active lifestyle.    Currently in Pain? Yes   Pain Score 3    Pain Location Knee   Pain Orientation Right;Lateral   Pain Descriptors / Indicators Aching   Pain Onset More than a month ago   Pain Frequency Constant   Aggravating Factors  walking; standing from low surface, night    Pain Relieving Factors rest, ice    Effect of Pain on Daily Activities difficulty sleeping             [facility] PT Assessment - 09/23/15 0001      Assessment   Medical Diagnosis Right knee pain    Referring Provider Dr Delilah Shan    Onset Date/Surgical Date --  January 2017    Hand Dominance Right   Next MD Visit 10/08/2015    Prior Therapy Yes but had to stop 2nd to GI bleed     Precautions   Precautions None     Restrictions   Weight Bearing Restrictions No     Balance Screen   Has the patient fallen in the past 6 months No     Home Environment   Additional Comments Lives at home with husband in 1 sotry home. No steps in the house      Prior Function   Level of Independence Independent   Leisure Active and perfroms house work,      Cognition   Overall Cognitive Status Within Functional Limits for tasks assessed     Observation/Other Assessments   Observations Focal pockets of swelling around the medial and lateral menisucs    Focus on Therapeutic Outcomes (FOTO)  Not given by front desk      Sensation   Light Touch Appears Intact     ROM / Strength   AROM / PROM / Strength AROM;PROM;Strength     AROM   Overall AROM Comments No significant pain at end range active ROM      PROM   Overall PROM Comments WNL bilateral      Strength   Overall Strength Comments left knee WNL    Strength Assessment Site Knee;Hip;Ankle   Right/Left Hip Right   Right Hip Flexion 4+/5   Right Hip Extension 4/5   Right Hip ABduction 4+/5   Right Hip ADduction 5/5   Right/Left Knee Right   Right Knee Flexion 5/5   Right Knee Extension 5/5      Palpation   Palpation comment tenderness to aplaptation around the medial meniscus      Ambulation/Gait   Gait Comments right lower extremity toe out                   [facility] Adult PT Treatment/Exercise - 09/23/15 0001      Knee/Hip Exercises: Seated   Long Arc  Quad Limitations x10      Knee/Hip Exercises: Supine   Quad Sets Limitations x10 5 sec hold    Short Arc Quad Sets Limitations 2x10    Bridges Limitations x10    Straight Leg Raises Limitations x10                 PT Education - 09/23/15 1741    Education provided Yes   Education Details Updated HEP; anatomy of the knee; symptom management; theory behind quad strengthening   Person(s) Educated Patient   Methods Explanation;Demonstration   Comprehension Verbalized understanding;Returned demonstration          PT Short Term Goals - 09/23/15 1751      PT SHORT TERM GOAL #1   Title Patient will report no pain    Time 4   Period Weeks   Status New     PT SHORT TERM GOAL #2   Title Patient will increase gross bilateral lower extremity strength to 5/5    Time 4   Period Weeks   Status New     PT SHORT TERM GOAL #3   Title Patient will report no tenderness to palpation in around the medial meniscus    Time 4   Period Weeks   Status New     PT SHORT TERM GOAL #4   Title Patient will be independent with initial HEP    Time 4   Period Weeks   Status New           PT Long Term Goals - 09/23/15 1752      PT LONG TERM GOAL #1   Title Patient will squat to pick item off the ground without pain    Time 6   Period Weeks   Status New     PT LONG TERM GOAL #2   Title Patient will perfrom household chores without pain    Time 6   Period Weeks   Status New     PT LONG TERM GOAL #3   Title Patient will sleep through the night without pain    Time 6   Period Weeks   Status New     PT LONG TERM GOAL #4   Title Pateint will be independent with program for quad strength and  stability                Plan - 09/23/15 1742    Clinical Impression Statement Patient is an 82 year old female with rigfht knee pain. Signs and objective measures sugesst medial meniscal derangement. She presents with right lower extremity weakness and and decreased right lower extremity stability. She would benefit from skilled therapy to improve knee stability and decrease inflammation. She was seen for a low complexity evaluation.    Rehab Potential Good   PT Frequency 2x / week   PT Duration 8 weeks   PT Treatment/Interventions ADLs/Self Care Home Management;Cryotherapy;Electrical Stimulation;Iontophoresis 68m/ml Dexamethasone;Gait training;Stair training;Functional mobility training;Ultrasound;Moist Heat;Therapeutic activities;Therapeutic exercise;Neuromuscular re-education;Patient/family education;Passive range of motion;Manual techniques;Vasopneumatic Device;Dry needling;Taping   PT Next Visit Plan continue to progress quad strength and stability exercises as tolerated. Consider hip stregthening as well.    PT Home Exercise Plan quad sets, laq, bridging, SLR, SAQ    Consulted and Agree with Plan of Care Patient      Patient will benefit from skilled therapeutic intervention in order to improve the following deficits and impairments:  Abnormal gait, Decreased mobility, Decreased strength, Pain, Decreased activity tolerance, Difficulty walking, Increased edema  Visit Diagnosis: Pain in right knee  Muscle weakness (generalized)  Difficulty in walking, not elsewhere classified     Problem List Patient Active Problem List   Diagnosis Date Noted  . Iron deficiency anemia, unspecified 07/06/2015  . Symptomatic anemia   . Upper GI bleed 07/04/2015  . Primary osteoarthritis of right knee 06/22/2015  . Osteoporosis 11/19/2014  . Aneurysm of infrarenal abdominal aorta (HCC) 11/19/2013  . History of endometrial cancer 04/30/2013  . Squamous cell skin cancer, face 03/22/2012   . Macular degeneration 09/13/2011  . Obese 09/13/2011  . History of breast cancer in female 03/17/2011  . Essential hypertension, benign 09/26/2007  . OBESITY 09/24/2007  . History of CVA (cerebrovascular accident) 09/24/2007  . Hyperlipidemia LDL goal <100 11/03/2005  . GERD 11/03/2005    Carney Living PT DPT  09/23/2015, 5:55 PM  [facility] [address] [place], [place], [address] Phone: [phone]   Fax:  [phone]  Name: TIANE SZYDLOWSKI MRN: [id number] Date of Birth: 09-Oct-1932

## 2015-09-30 ENCOUNTER — Ambulatory Visit: Payer: Medicare Other | Admitting: Physical Therapy

## 2015-09-30 DIAGNOSIS — M6281 Muscle weakness (generalized): Secondary | ICD-10-CM | POA: Diagnosis not present

## 2015-09-30 DIAGNOSIS — M25561 Pain in right knee: Secondary | ICD-10-CM | POA: Diagnosis not present

## 2015-09-30 DIAGNOSIS — R2689 Other abnormalities of gait and mobility: Secondary | ICD-10-CM

## 2015-09-30 DIAGNOSIS — R262 Difficulty in walking, not elsewhere classified: Secondary | ICD-10-CM | POA: Diagnosis not present

## 2015-10-01 NOTE — Therapy (Signed)
[place], [place], [address] Phone: [phone]   Fax:  [phone]  Physical Therapy Treatment  Patient Details  Name: Michelle [name] MRN: [id number] Date of Birth: Jun 14, 1932 Referring Provider: Drue Second  Encounter Date: 09/30/2015      PT End of Session - 10/01/15 0748    Visit Number 2   Number of Visits 16   Date for PT Re-Evaluation 11/18/15   Authorization Type UHC medicare    PT Start Time 0130   PT Stop Time 0211   PT Time Calculation (min) 41 min   Activity Tolerance Patient tolerated treatment well   Behavior During Therapy [facility] for tasks assessed/performed      Past Medical History:  Diagnosis Date  . AAA (abdominal aortic aneurysm) ([place]) 10/2013  . Basal cell carcinoma 2011   R back  . Bradycardia   . Breast cancer ([place]) 02/2011   US-guided biopsy  . Cataract   . Endometrial cancer ([place]) 05/2013   uterine ca  . GERD (gastroesophageal reflux disease)   . Hiatal hernia 2008  . History of breast cancer   . History of cerebrovascular accident 2009  . Hyperlipidemia   . Hypertension 2012  . Macular degeneration   . Polyp of colon   . Shingles 2008  . Squamous cell carcinoma ([place])    2010-2015  . Stroke ([place]) 09/2007   Slight memory problems residual  . Tendonitis    rt hand  . Vocal cord polyp     Past Surgical History:  Procedure Laterality Date  . BASAL CELL CARCINOMA EXCISION    . biospy  10/28/09   Shave Biospy skin Left hand(Acitinic Keratoses), Right Upper Arm (superficial Basal Cell), Right Upper Back(Superficial Basal Cell), Left Neck ( Solar Lentigo & Seborrheic Keratoses)  . BREAST BIOPSY  02/11/11   Right Breast Needle Core Biospy - Upper Outer Quadrant; ER/PR 100%, Her-2 Neu neg.; Ki-67 10%  . BREAST MAMMOSITE  03/14/2011   Procedure: MAMMOSITE BREAST;  Surgeon: Rolm Bookbinder, MD;  Location: [facility];  Service: General;  Laterality:  Right;  . BREAST SURGERY     right breast lumpectomy snbx  . CHOLECYSTECTOMY  2004  . COLONOSCOPY    . COLONOSCOPY W/ POLYPECTOMY     2008, 2014 (benign)  . COLONOSCOPY WITH PROPOFOL N/A 07/09/2015   Procedure: COLONOSCOPY WITH PROPOFOL;  Surgeon: Wonda Horner, MD;  Location: [facility] ENDOSCOPY;  Service: Endoscopy;  Laterality: N/A;  . DILATION AND CURETTAGE OF UTERUS N/A 04/24/2013   Procedure: DILATATION AND CURETTAGE;  Surgeon: Guss Bunde, MD;  Location: [facility];  Service: Gynecology;  Laterality: N/A;  . ESOPHAGOGASTRODUODENOSCOPY N/A 07/06/2015   Procedure: ESOPHAGOGASTRODUODENOSCOPY (EGD);  Surgeon: Wilford Corner, MD;  Location[facility] END[facility];  Service: Endoscopy;  Laterality: N/A;  . HYSTEROSCOPY N/A 04/24/2013   Procedure: HYSTEROSCOPY;  Surgeon: Guss Bunde, MD;  Location: [facility];  Service: Gynecology;  Laterality: N/A;  . ROBOTIC ASSISTED TOTAL HYSTERECTOMY WITH BILATERAL SALPINGO OOPHERECTOMY Bilateral 06/11/2013   Procedure: ROBOTIC ASSISTED TOTAL HYSTERECTOMY WITH BILATERAL SALPINGO OOPHORECTOMY WITH POSSIBLE STAGING, EPISIOTOMY;  Surgeon: Janie Morning, MD;  Location: [facility];  Service: Gynecology;  Laterality: Bilateral;  . SQUAMOUS CELL CARCINOMA EXCISION    . TEAR DUCT PROBING    . TONSILLECTOMY    . vocal cord poylps  1969   excision    There were no vitals filed for this visit.      Subjective Assessment -  09/30/15 1420    Subjective Patient reported she was a little sore after the last visit. She has been doing a lot of activity today and feels overall fatigued.    Patient is accompained by: Family member   Pertinent History GI bleed low HGB    How long can you sit comfortably? No limit    How long can you stand comfortably? No limit    How long can you walk comfortably? limited community distances.    Diagnostic tests x-RAYS (06/2015) Degenration in both knees with spuring on the right.    Currently in Pain? Yes   Pain Score 1    Pain Location Knee   Pain  Orientation Right;Lateral   Pain Descriptors / Indicators Aching   Pain Onset More than a month ago   Pain Frequency Constant   Aggravating Factors  walking, standing from a low surface    Pain Relieving Factors rest, ice    Effect of Pain on Daily Activities difficulty sleeping,                          [facility] Adult PT Treatment/Exercise - 10/01/15 0001      Knee/Hip Exercises: Standing   Heel Raises Limitations 2x10    Hip Flexion Limitations standing march x10 w/ right    Hip ADduction Limitations 2x10   Abduction Limitations x10 w/ right    Extension Limitations x10 w/ right      Knee/Hip Exercises: Seated   Long Arc Quad Limitations x10 w/o resistance, demosntrated LAQ with t-band    Ball Squeeze 2x10   Clamshell with TheraBand Yellow     Knee/Hip Exercises: Supine   Quad Sets Limitations x10 5 sec hold    Short Arc Quad Sets Limitations 2x10    Bridges Limitations 2x10   Straight Leg Raises Limitations x10 bilateral                 PT Education - 10/01/15 0747    Education provided Yes   Education Details upodated HEP; symptom mangement    Person(s) Educated Patient   Methods Explanation   Comprehension Verbalized understanding;Returned demonstration          PT Short Term Goals - 09/23/15 1751      PT SHORT TERM GOAL #1   Title Patient will report no pain    Time 4   Period Weeks   Status New     PT SHORT TERM GOAL #2   Title Patient will increase gross bilateral lower extremity strength to 5/5    Time 4   Period Weeks   Status New     PT SHORT TERM GOAL #3   Title Patient will report no tenderness to palpation in around the medial meniscus    Time 4   Period Weeks   Status New     PT SHORT TERM GOAL #4   Title Patient will be independent with initial HEP    Time 4   Period Weeks   Status New           PT Long Term Goals - 09/23/15 1752      PT LONG TERM GOAL #1   Title Patient will squat to pick item off the  ground without pain    Time 6   Period Weeks   Status New     PT LONG TERM GOAL #2   Title Patient will perfrom household chores without pain  Time 6   Period Weeks   Status New     PT LONG TERM GOAL #3   Title Patient will sleep through the night without pain    Time 6   Period Weeks   Status New     PT LONG TERM GOAL #4   Title Pateint will be independent with program for quad strength and stability                Plan - 10/01/15 0748    Clinical Impression Statement Updated HEP. Patient requested standing exericses since she will not be here next week. Patient advsied to work into exercises slowly and to only perfrom 6-7 at first. She demsotrated an understanding.    Rehab Potential Good   PT Frequency 2x / week   PT Duration 8 weeks   PT Treatment/Interventions ADLs/Self Care Home Management;Cryotherapy;Electrical Stimulation;Iontophoresis 69m/ml Dexamethasone;Gait training;Stair training;Functional mobility training;Ultrasound;Moist Heat;Therapeutic activities;Therapeutic exercise;Neuromuscular re-education;Patient/family education;Passive range of motion;Manual techniques;Vasopneumatic Device;Dry needling;Taping   PT Next Visit Plan continue to progress quad strength and stability exercises as tolerated. Consider hip stregthening as well.    PT Home Exercise Plan quad sets, laq, bridging, SLR, SAQ    Consulted and Agree with Plan of Care Patient      Patient will benefit from skilled therapeutic intervention in order to improve the following deficits and impairments:  Abnormal gait, Decreased mobility, Decreased strength, Pain, Decreased activity tolerance, Difficulty walking, Increased edema  Visit Diagnosis: Pain in right knee  Muscle weakness (generalized)  Difficulty in walking, not elsewhere classified  Other abnormalities of gait and mobility     Problem List Patient Active Problem List   Diagnosis Date Noted  . Iron deficiency anemia,  unspecified 07/06/2015  . Symptomatic anemia   . Upper GI bleed 07/04/2015  . Primary osteoarthritis of right knee 06/22/2015  . Osteoporosis 11/19/2014  . Aneurysm of infrarenal abdominal aorta (HCC) 11/19/2013  . History of endometrial cancer 04/30/2013  . Squamous cell skin cancer, face 03/22/2012  . Macular degeneration 09/13/2011  . Obese 09/13/2011  . History of breast cancer in female 03/17/2011  . Essential hypertension, benign 09/26/2007  . OBESITY 09/24/2007  . History of CVA (cerebrovascular accident) 09/24/2007  . Hyperlipidemia LDL goal <100 11/03/2005  . GERD 11/03/2005    DCarney Living PT DPT  10/01/2015, 7:52 AM  [facility] [address]Phone: [phone]  Fax:  [phone] Name: FMARDY HOPPEMRN: [id number] of Birth: [birth year]

## 2015-10-07 ENCOUNTER — Ambulatory Visit: Payer: Medicare Other | Admitting: Physical Therapy

## 2015-10-08 ENCOUNTER — Telehealth: Payer: Self-pay | Admitting: Gynecologic Oncology

## 2015-10-08 DIAGNOSIS — M25561 Pain in right knee: Secondary | ICD-10-CM | POA: Diagnosis not present

## 2015-10-08 DIAGNOSIS — M25461 Effusion, right knee: Secondary | ICD-10-CM | POA: Diagnosis not present

## 2015-10-08 NOTE — Telephone Encounter (Signed)
Patient called stating she had developed lymphedema after her surgery with Dr. Skeet Latch.  She reports no swelling in her leg but only numbness in the groin for the past 6 months.  Stating she has arthritis in her knee and asking about having a steroid injection in that knee.  Advised that there is a risk of infection and would advise to hold off on receiving an injection at this time.  Patient verbalizing understanding and will call the office for any questions, concerns, or new/ worsening symptoms.

## 2015-10-14 ENCOUNTER — Ambulatory Visit: Payer: Medicare Other | Attending: Sports Medicine | Admitting: Physical Therapy

## 2015-10-14 DIAGNOSIS — R2689 Other abnormalities of gait and mobility: Secondary | ICD-10-CM

## 2015-10-14 DIAGNOSIS — R262 Difficulty in walking, not elsewhere classified: Secondary | ICD-10-CM | POA: Insufficient documentation

## 2015-10-14 DIAGNOSIS — M6281 Muscle weakness (generalized): Secondary | ICD-10-CM | POA: Diagnosis not present

## 2015-10-14 DIAGNOSIS — M25561 Pain in right knee: Secondary | ICD-10-CM | POA: Diagnosis not present

## 2015-10-15 NOTE — Therapy (Signed)
[place], [place], [address] Phone: [phone]   Fax:  [phone]  Physical Therapy Treatment  Patient Details  Name: Michelle [name] MRN: [id number] Date of Birth: August 14, 1932 Referring Provider: Drue Second  Encounter Date: 10/14/2015      PT End of Session - 10/14/15 1521    Visit Number 3   Number of Visits 16   Date for PT Re-Evaluation 11/18/15   Authorization Type UHC medicare    PT Start Time 1418   PT Stop Time 1502   PT Time Calculation (min) 44 min   Activity Tolerance Patient tolerated treatment well   Behavior During Therapy [name] for tasks assessed/performed      Past Medical History:  Diagnosis Date  . AAA (abdominal aortic aneurysm) ([place]) 10/2013  . Basal cell carcinoma 2011   R back  . Bradycardia   . Breast cancer ([place]) 02/2011   US-guided biopsy  . Cataract   . Endometrial cancer ([place]) 05/2013   uterine ca  . GERD (gastroesophageal reflux disease)   . Hiatal hernia 2008  . History of breast cancer   . History of cerebrovascular accident 2009  . Hyperlipidemia   . Hypertension 2012  . Macular degeneration   . Polyp of colon   . Shingles 2008  . Squamous cell carcinoma ([place])    2010-2015  . Stroke ([place]) 09/2007   Slight memory problems residual  . Tendonitis    rt hand  . Vocal cord polyp     Past Surgical History:  Procedure Laterality Date  . BASAL CELL CARCINOMA EXCISION    . biospy  10/28/09   Shave Biospy skin Left hand(Acitinic Keratoses), Right Upper Arm (superficial Basal Cell), Right Upper Back(Superficial Basal Cell), Left Neck ( Solar Lentigo & Seborrheic Keratoses)  . BREAST BIOPSY  02/11/11   Right Breast Needle Core Biospy - Upper Outer Quadrant; ER/PR 100%, Her-2 Neu neg.; Ki-67 10%  . BREAST MAMMOSITE  03/14/2011   Procedure: MAMMOSITE BREAST;  Surgeon: Rolm Bookbinder, MD;  Location: [place];  Service: General;  Laterality:  Right;  . BREAST SURGERY     right breast lumpectomy snbx  . CHOLECYSTECTOMY  2004  . COLONOSCOPY    . COLONOSCOPY W/ POLYPECTOMY     2008, 2014 (benign)  . COLONOSCOPY WITH PROPOFOL N/A 07/09/2015   Procedure: COLONOSCOPY WITH PROPOFOL;  Surgeon: Wonda Horner, MD;  Location[name];  Service: Endoscopy;  Laterality: N/A;  . DILATION AND CURETTAGE OF UTERUS N/A 04/24/2013   Procedure: DILATATION AND CURETTAGE;  Surgeon: Guss Bunde, MD;  Location: [facility];  Service: Gynecology;  Laterality: N/A;  . ESOPHAGOGASTRODUODENOSCOPY N/A 07/06/2015   Procedure: ESOPHAGOGASTRODUODENOSCOPY (EGD);  Surgeon: Wilford Corner, MD;  [facility]: [facility]COPY;  Service: Endoscopy;  Laterality: N/A;  . HYSTEROSCOPY N/A 04/24/2013   Procedure: HYSTEROSCOPY;  Surgeon: Guss Bunde, MD;  Location: [place] [facility];  Service: Gynecology;  Laterality: N/A;  . ROBOTIC ASSISTED TOTAL HYSTERECTOMY WITH BILATERAL SALPINGO OOPHERECTOMY Bilateral 06/11/2013   Procedure: ROBOTIC ASSISTED TOTAL HYSTERECTOMY WITH BILATERAL SALPINGO OOPHORECTOMY WITH POSSIBLE STAGING, EPISIOTOMY;  Surgeon: Janie Morning, MD;  Location: [facility];  Service: Gynecology;  Laterality: Bilateral;  . SQUAMOUS CELL CARCINOMA EXCISION    . TEAR DUCT PROBING    . TONSILLECTOMY    . vocal cord poylps  1969   excision    There were no vitals filed for this visit.      Subjective Assessment -  10/14/15 1517    Subjective Patient reports she has been doing her exercises sometimes but not consitently. She is not having much pain at night but she is still notsleeping. She is having no pain today.    Patient is accompained by: Family member   Pertinent History GI bleed low HGB    How long can you sit comfortably? No limit    How long can you stand comfortably? No limit    How long can you walk comfortably? limited community distances.    Diagnostic tests x-RAYS (06/2015) Degenration in both knees with spuring on the right.    Patient Stated Goals To  decrease pain with active lifestyle.    Currently in Pain? No/denies                         [facility] Adult PT Treatment/Exercise - 10/15/15 0001      Knee/Hip Exercises: Standing   Heel Raises Limitations 2x10    Hip Flexion Limitations standing [place] x10 w/ right    Hip ADduction Limitations 2x10   Abduction Limitations x10 w/ bilateral    Extension Limitations x10 w/ bilateral      Knee/Hip Exercises: Seated   Long Arc Quad Limitations 2x10 w/o resistance, demosntrated LAQ with t-band    Ball Squeeze 2x10   Clamshell with TheraBand Yellow     Knee/Hip Exercises: Supine   Bridges Limitations 2x10   Straight Leg Raises Limitations x10 bilateral                 PT Education - 10/14/15 1521    Education provided Yes   Education Details reviewed HEP    Person(s) Educated Patient   Methods Explanation   Comprehension Verbalized understanding;Returned demonstration          PT Short Term Goals - 10/14/15 1528      PT SHORT TERM GOAL #1   Title Patient will report no pain    Baseline No pain today assess carryover    Time 4   Period Weeks   Status On-going     PT SHORT TERM GOAL #2   Title Patient will increase gross bilateral lower extremity strength to 5/5    Time 4   Period Weeks   Status On-going     PT SHORT TERM GOAL #3   Title Patient will report no tenderness to palpation in around the medial meniscus    Baseline No significant tenderness at this time.    Time 4   Period Weeks   Status On-going     PT SHORT TERM GOAL #4   Title Patient will be independent with initial HEP    Time 4   Period Weeks   Status On-going           PT Long Term Goals - 09/23/15 1752      PT LONG TERM GOAL #1   Title Patient will squat to pick item off the ground without pain    Time 6   Period Weeks   Status New     PT LONG TERM GOAL #2   Title Patient will perfrom household chores without pain    Time 6   Period Weeks   Status New      PT LONG TERM GOAL #3   Title Patient will sleep through the night without pain    Time 6   Period Weeks   Status New     PT LONG  TERM GOAL #4   Title Pateint will be independent with program for quad strength and stability                Plan - 10/14/15 1526    Clinical Impression Statement Patient is making good progress. She had no increase in pain with exercises. She feels like her exercises are challenging. No new exercises added at this time because she feels like it is difficult to get through all the ones she has now.    Rehab Potential Good   PT Frequency 2x / week   PT Duration 8 weeks   PT Treatment/Interventions ADLs/Self Care Home Management;Cryotherapy;Electrical Stimulation;Iontophoresis 71m/ml Dexamethasone;Gait training;Stair training;Functional mobility training;Ultrasound;Moist Heat;Therapeutic activities;Therapeutic exercise;Neuromuscular re-education;Patient/family education;Passive range of motion;Manual techniques;Vasopneumatic Device;Dry needling;Taping   PT Next Visit Plan continue to progress quad strength and stability exercises as tolerated. Consider hip stregthening as well.    PT Home Exercise Plan quad sets, laq, bridging, SLR, SAQ    Consulted and Agree with Plan of Care Patient      Patient will benefit from skilled therapeutic intervention in order to improve the following deficits and impairments:  Abnormal gait, Decreased mobility, Decreased strength, Pain, Decreased activity tolerance, Difficulty walking, Increased edema  Visit Diagnosis: Pain in right knee  Muscle weakness (generalized)  Difficulty in walking, not elsewhere classified  Other abnormalities of gait and mobility     Problem List Patient Active Problem List   Diagnosis Date Noted  . Iron deficiency anemia, unspecified 07/06/2015  . Symptomatic anemia   . Upper GI bleed 07/04/2015  . Primary osteoarthritis of right knee 06/22/2015  . Osteoporosis 11/19/2014  .  Aneurysm of infrarenal abdominal aorta (HCC) 11/19/2013  . History of endometrial cancer 04/30/2013  . Squamous cell skin cancer, face 03/22/2012  . Macular degeneration 09/13/2011  . Obese 09/13/2011  . History of breast cancer in female 03/17/2011  . Essential hypertension, benign 09/26/2007  . OBESITY 09/24/2007  . History of CVA (cerebrovascular accident) 09/24/2007  . Hyperlipidemia LDL goal <100 11/03/2005  . GERD 11/03/2005    DCarney LivingPT DPT  10/15/2015, 1:42 PM  [facility] [address]Phone: [phone]  Fax:  [phone] Name: FELGENE CORALMRN: [id number] of Birth: 106-27-1934

## 2015-10-21 ENCOUNTER — Encounter: Payer: Medicare Other | Admitting: Physical Therapy

## 2015-10-28 ENCOUNTER — Ambulatory Visit: Payer: Medicare Other | Admitting: Physical Therapy

## 2015-10-28 DIAGNOSIS — R2689 Other abnormalities of gait and mobility: Secondary | ICD-10-CM

## 2015-10-28 DIAGNOSIS — M6281 Muscle weakness (generalized): Secondary | ICD-10-CM | POA: Diagnosis not present

## 2015-10-28 DIAGNOSIS — M25561 Pain in right knee: Secondary | ICD-10-CM

## 2015-10-28 DIAGNOSIS — R262 Difficulty in walking, not elsewhere classified: Secondary | ICD-10-CM | POA: Diagnosis not present

## 2015-10-28 NOTE — Therapy (Addendum)
[place], [place], [address] Phone: [phone]   Fax:  [phone]  Physical Therapy Treatment  Patient Details  Name: Michelle [name] MRN: [id number] Date of Birth: 01/04/1933 Referring Provider: Drue Second  Encounter Date: 10/28/2015      PT End of Session - 10/28/15 1508    Visit Number 4   Number of Visits 16   Date for PT Re-Evaluation 11/18/15   Authorization Type UHC medicare    PT Start Time 0962   PT Stop Time 1457   PT Time Calculation (min) 42 min   Activity Tolerance Patient tolerated treatment well   Behavior During Therapy [facility] for tasks assessed/performed      Past Medical History:  Diagnosis Date  . AAA (abdominal aortic aneurysm) ([name]) 10/2013  . Basal cell carcinoma 2011   R back  . Bradycardia   . Breast cancer ([place]) 02/2011   US-guided biopsy  . Cataract   . Endometrial cancer ([place]) 05/2013   uterine ca  . GERD (gastroesophageal reflux disease)   . Hiatal hernia 2008  . History of breast cancer   . History of cerebrovascular accident 2009  . Hyperlipidemia   . Hypertension 2012  . Macular degeneration   . Polyp of colon   . Shingles 2008  . Squamous cell carcinoma ([place])    2010-2015  . Stroke ([place]) 09/2007   Slight memory problems residual  . Tendonitis    rt hand  . Vocal cord polyp     Past Surgical History:  Procedure Laterality Date  . BASAL CELL CARCINOMA EXCISION    . biospy  10/28/09   Shave Biospy skin Left hand(Acitinic Keratoses), Right Upper Arm (superficial Basal Cell), Right Upper Back(Superficial Basal Cell), Left Neck ( Solar Lentigo & Seborrheic Keratoses)  . BREAST BIOPSY  02/11/11   Right Breast Needle Core Biospy - Upper Outer Quadrant; ER/PR 100%, Her-2 Neu neg.; Ki-67 10%  . BREAST MAMMOSITE  03/14/2011   Procedure: MAMMOSITE BREAST;  Surgeon: Rolm Bookbinder, MD;  Location: [place];  Service: General;  Laterality:  Right;  . BREAST SURGERY     right breast lumpectomy snbx  . CHOLECYSTECTOMY  2004  . COLONOSCOPY    . COLONOSCOPY W/ POLYPECTOMY     2008, 2014 (benign)  . COLONOSCOPY WITH PROPOFOL N/A 07/09/2015   Procedure: COLONOSCOPY WITH PROPOFOL;  Surgeon: Wonda Horner, MD;  Location[facility];  Service: Endoscopy;  Laterality: N/A;  . DILATION AND CURETTAGE OF UTERUS N/A 04/24/2013   Procedure: DILATATION AND CURETTAGE;  Surgeon: Guss Bunde, MD;  Location: [facility];  Service: Gynecology;  Laterality: N/A;  . ESOPHAGOGASTRODUODENOSCOPY N/A 07/06/2015   Procedure: ESOPHAGOGASTRODUODENOSCOPY (EGD);  Surgeon: Wilford Corner, MD;  Location[facility];  Service: Endoscopy;  Laterality: N/A;  . HYSTEROSCOPY N/A 04/24/2013   Procedure: HYSTEROSCOPY;  Surgeon: Guss Bunde, MD;  Location: [facility];  Service: Gynecology;  Laterality: N/A;  . ROBOTIC ASSISTED TOTAL HYSTERECTOMY WITH BILATERAL SALPINGO OOPHERECTOMY Bilateral 06/11/2013   Procedure: ROBOTIC ASSISTED TOTAL HYSTERECTOMY WITH BILATERAL SALPINGO OOPHORECTOMY WITH POSSIBLE STAGING, EPISIOTOMY;  Surgeon: Janie Morning, MD;  Location: [facility];  Service: Gynecology;  Laterality: Bilateral;  . SQUAMOUS CELL CARCINOMA EXCISION    . TEAR DUCT PROBING    . TONSILLECTOMY    . vocal cord poylps  1969   excision    There were no vitals filed for this visit.      Subjective Assessment -  10/28/15 1430    Subjective Patient reports her pain has been improving. She has had no pain for the past few days. Her pain is mostly at night when she has it.    Patient is accompained by: Interpreter   Pertinent History GI bleed low HGB    How long can you sit comfortably? No limit    How long can you stand comfortably? No limit    How long can you walk comfortably? limited community distances.    Diagnostic tests x-RAYS (06/2015) Degenration in both knees with spuring on the right.    Currently in Pain? No/denies                          [facility] Adult PT Treatment/Exercise - 10/28/15 0001      Knee/Hip Exercises: Seated   Long Arc Quad Limitations 2x10 w/o resistance, demosntrated LAQ with red t-band    Ball Squeeze 2x10   Clamshell with TheraBand Yellow     Knee/Hip Exercises: Supine   Short Arc Quad Sets Limitations 2x10 1lb   Bridges Limitations 2x10   Straight Leg Raises Limitations 2x10 bilateral                 PT Education - 10/28/15 1507    Education provided Yes   Education Details improtance of continued stretching and strengthening   Person(s) Educated Patient   Methods Explanation   Comprehension Verbalized understanding;Returned demonstration          PT Short Term Goals - 10/28/15 1527      PT SHORT TERM GOAL #1   Title Patient will report no pain    Baseline No pain today assess carryover    Time 4   Period Weeks   Status Achieved     PT SHORT TERM GOAL #2   Title Patient will increase gross bilateral lower extremity strength to 5/5    Baseline 4+/5 right hip flexion   Time 4   Period Weeks   Status On-going     PT SHORT TERM GOAL #3   Title Patient will report no tenderness to palpation in around the medial meniscus    Baseline No significant tenderness at this time.    Time 4   Period Weeks   Status Achieved     PT SHORT TERM GOAL #4   Title Patient will be independent with initial HEP    Baseline performed without cuing    Time 4   Period Weeks   Status Achieved           PT Long Term Goals - 10/28/15 1529      PT LONG TERM GOAL #1   Title Patient will squat to pick item off the ground without pain    Baseline has ben doing without difficulty    Time 6   Period Weeks   Status Achieved     PT LONG TERM GOAL #2   Title Patient will perfrom household chores without pain    Time 6   Period Weeks   Status Achieved     PT LONG TERM GOAL #3   Title Patient will sleep through the night without pain    Time 6   Period Weeks   Status On-going     PT LONG  TERM GOAL #4   Title Pateint will be independent with program for quad strength and stability    Time 6   Period Weeks  Status Achieved               Plan - Nov 07, 2015 1509    Clinical Impression Statement Patient continues to have intermittent pain but the pain is becoming less frequent. Sh eis Indepdnendent with her exercises. She plans on working on her exercises over the next 2 weeks. If she has pain she will come back for 1 more visit. If she does not she will discharge to her home program. She was able to eprfrom all exercises without cuing.     PT Frequency 2x / week   PT Duration 8 weeks   PT Treatment/Interventions ADLs/Self Care Home Management;Cryotherapy;Electrical Stimulation;Iontophoresis 27m/ml Dexamethasone;Gait training;Stair training;Functional mobility training;Ultrasound;Moist Heat;Therapeutic activities;Therapeutic exercise;Neuromuscular re-education;Patient/family education;Passive range of motion;Manual techniques;Vasopneumatic Device;Dry needling;Taping   PT Next Visit Plan continue to progress quad strength and stability exercises as tolerated. Consider hip stregthening as well.    PT Home Exercise Plan quad sets, laq, bridging, SLR, SAQ    Consulted and Agree with Plan of Care Patient      Patient will benefit from skilled therapeutic intervention in order to improve the following deficits and impairments:  Abnormal gait, Decreased mobility, Decreased strength, Pain, Decreased activity tolerance, Difficulty walking, Increased edema  Visit Diagnosis: Pain in right knee  Muscle weakness (generalized)  Difficulty in walking, not elsewhere classified  Other abnormalities of gait and mobility       G-Codes - [id number]    Functional Limitation Mobility: Walking and moving around   Mobility: Walking and Moving Around Current Status ([id number] At least 1 percent but less than 20 percent impaired, limited or restricted   Mobility: Walking and Moving Around  Goal Status [phone] At least 1 percent but less than 20 percent impaired, limited or restricted      PHYSICAL THERAPY DISCHARGE SUMMARY  Visits from Start of Care: 4  Current functional level related to goals / functional outcomes: Patient had made good progress. She was to return if she was having pain   Remaining deficits: Continued pain but likely baseline    Education / Equipment: HEP Plan: Patient agrees to discharge.  Patient goals were met. Patient is being discharged due to not returning since the last visit.  ?????      Problem List Patient Active Problem List   Diagnosis Date Noted  . Iron deficiency anemia, unspecified 07/06/2015  . Symptomatic anemia   . Upper GI bleed 07/04/2015  . Primary osteoarthritis of right knee 06/22/2015  . Osteoporosis 11/19/2014  . Aneurysm of infrarenal abdominal aorta (HCC) 11/19/2013  . History of endometrial cancer 04/30/2013  . Squamous cell skin cancer, face 03/22/2012  . Macular degeneration 09/13/2011  . Obese 09/13/2011  . History of breast cancer in female 03/17/2011  . Essential hypertension, benign 09/26/2007  . OBESITY 09/24/2007  . History of CVA (cerebrovascular accident) 09/24/2007  . Hyperlipidemia LDL goal <100 11/03/2005  . GERD 11/03/2005    DCarney Living910/07/17 3:31 PM  [facility] [place] [address]Phone: [phone]  Fax:  [phone] Name: FMYRISSA CHIPLEYMRN: [id number] of Birth: 103-09-1932

## 2015-10-29 DIAGNOSIS — R928 Other abnormal and inconclusive findings on diagnostic imaging of breast: Secondary | ICD-10-CM | POA: Diagnosis not present

## 2015-10-29 LAB — HM MAMMOGRAPHY

## 2015-11-03 ENCOUNTER — Encounter: Payer: Self-pay | Admitting: Family Medicine

## 2015-11-06 ENCOUNTER — Other Ambulatory Visit: Payer: Self-pay | Admitting: Family Medicine

## 2015-11-10 ENCOUNTER — Ambulatory Visit (INDEPENDENT_AMBULATORY_CARE_PROVIDER_SITE_OTHER): Payer: Medicare Other | Admitting: Family Medicine

## 2015-11-10 ENCOUNTER — Encounter: Payer: Self-pay | Admitting: Family Medicine

## 2015-11-10 VITALS — BP 135/61 | HR 57 | Ht 67.0 in | Wt 180.0 lb

## 2015-11-10 DIAGNOSIS — D582 Other hemoglobinopathies: Secondary | ICD-10-CM

## 2015-11-10 DIAGNOSIS — Z130 Encounter for screening for diseases of the blood and blood-forming organs and certain disorders involving the immune mechanism: Secondary | ICD-10-CM | POA: Diagnosis not present

## 2015-11-10 DIAGNOSIS — Z8719 Personal history of other diseases of the digestive system: Secondary | ICD-10-CM | POA: Diagnosis not present

## 2015-11-10 DIAGNOSIS — I10 Essential (primary) hypertension: Secondary | ICD-10-CM | POA: Diagnosis not present

## 2015-11-10 DIAGNOSIS — Z23 Encounter for immunization: Secondary | ICD-10-CM | POA: Diagnosis not present

## 2015-11-10 NOTE — Progress Notes (Signed)
Subjective:    CC: HTN  HPI: Hypertension- Pt denies chest pain, SOB, dizziness, or heart palpitations.  Taking meds as directed w/o problems.  Denies medication side effects.    Recent upper GI blood.  She received a couple units of packed red blood cells. Hemoglobin was around 10. She now still on Plavix for taking a PPI with it. Is also still taking extra iron supplementation.  Hypocalcemia - she does take 500 mg of extra calcium daily.   Chemistry      Component Value Date/Time   NA 142 07/06/2015 0558   K 3.9 07/06/2015 0558   CL 116 (H) 07/06/2015 0558   CO2 22 07/06/2015 0558   BUN 14 07/06/2015 0558   BUN 12.3 11/01/2013 1031   CREATININE 0.71 07/06/2015 0558   CREATININE 0.93 (H) 05/12/2015 1421   CREATININE 0.9 11/01/2013 1031      Component Value Date/Time   CALCIUM 7.6 (L) 07/06/2015 0558   ALKPHOS 42 07/04/2015 1029   AST 15 07/04/2015 1029   ALT 11 (L) 07/04/2015 1029   BILITOT 0.3 07/04/2015 1029        Past medical history, Surgical history, Family history not pertinant except as noted below, Social history, Allergies, and medications have been entered into the medical record, reviewed, and corrections made.   Review of Systems: No fevers, chills, night sweats, weight loss, chest pain, or shortness of breath.   Objective:    General: Well Developed, well nourished, and in no acute distress.  Neuro: Alert and oriented x3, extra-ocular muscles intact, sensation grossly intact.  HEENT: Normocephalic, atraumatic  Skin: Warm and dry, no rashes. Cardiac: Regular rate and rhythm, no murmurs rubs or gallops, no lower extremity edema.  Respiratory: Clear to auscultation bilaterally. Not using accessory muscles, speaking in full sentences.   Impression and Recommendations:    HTN - Well controlled. Continue current regimen. Follow-up in 6 months.  Upper GI bleed - due to recheck CBC. Continue PPI while on plavix. She is on iron now. Due to recheck levels.    Abnormal Heme chemistry. Recommendation from the hospital was to test her for hemachromatosis.  Hypocalcemia - due to recheck calcium levels.

## 2015-11-11 LAB — CBC WITH DIFFERENTIAL/PLATELET
BASOS ABS: 75 {cells}/uL (ref 0–200)
BASOS PCT: 1 %
EOS PCT: 2 %
Eosinophils Absolute: 150 cells/uL (ref 15–500)
HEMATOCRIT: 37 % (ref 35.0–45.0)
Hemoglobin: 12.3 g/dL (ref 11.7–15.5)
LYMPHS ABS: 1800 {cells}/uL (ref 850–3900)
Lymphocytes Relative: 24 %
MCH: 31.9 pg (ref 27.0–33.0)
MCHC: 33.2 g/dL (ref 32.0–36.0)
MCV: 95.9 fL (ref 80.0–100.0)
MONOS PCT: 8 %
MPV: 10.2 fL (ref 7.5–12.5)
Monocytes Absolute: 600 cells/uL (ref 200–950)
NEUTROS ABS: 4875 {cells}/uL (ref 1500–7800)
Neutrophils Relative %: 65 %
Platelets: 237 10*3/uL (ref 140–400)
RBC: 3.86 MIL/uL (ref 3.80–5.10)
RDW: 13.6 % (ref 11.0–15.0)
WBC: 7.5 10*3/uL (ref 3.8–10.8)

## 2015-11-11 LAB — CALCIUM: Calcium: 9.8 mg/dL (ref 8.6–10.4)

## 2015-11-11 LAB — FERRITIN: FERRITIN: 56 ng/mL (ref 20–288)

## 2015-11-15 LAB — HEMOCHROMATOSIS DNA-PCR(C282Y,H63D)

## 2015-12-07 ENCOUNTER — Other Ambulatory Visit: Payer: Self-pay

## 2015-12-07 MED ORDER — DICLOFENAC SODIUM 1 % TD GEL: 2.0000 g | Freq: Four times a day (QID) | TRANSDERMAL | 1 refills | Status: DC

## 2015-12-07 MED FILL — DICLOFENAC SODIUM 1% GEL: 1 | 25 days supply | Qty: 200 | Fill #0

## 2015-12-08 ENCOUNTER — Ambulatory Visit (INDEPENDENT_AMBULATORY_CARE_PROVIDER_SITE_OTHER): Payer: Medicare Other | Admitting: Family Medicine

## 2015-12-08 VITALS — BP 131/72 | HR 56 | Wt 181.0 lb

## 2015-12-08 DIAGNOSIS — R9389 Abnormal findings on diagnostic imaging of other specified body structures: Secondary | ICD-10-CM

## 2015-12-08 DIAGNOSIS — R059 Cough, unspecified: Secondary | ICD-10-CM

## 2015-12-08 DIAGNOSIS — R05 Cough: Secondary | ICD-10-CM | POA: Diagnosis not present

## 2015-12-08 DIAGNOSIS — R938 Abnormal findings on diagnostic imaging of other specified body structures: Secondary | ICD-10-CM | POA: Diagnosis not present

## 2015-12-08 MED ORDER — ALBUTEROL SULFATE (2.5 MG/3ML) 0.083% IN NEBU
2.5000 mg | INHALATION_SOLUTION | Freq: Once | RESPIRATORY_TRACT | Status: AC
  Administered 2015-12-08: 2.5 mg via RESPIRATORY_TRACT

## 2015-12-08 NOTE — Progress Notes (Signed)
   Subjective:    Patient ID: [name], female    DOB: 1932/08/09, 82 y.o.   MRN: [id number]  HPI Patient came in today for spirometry. She had a chest x-ray during recent hospitalization which showed possible COPD had asked her come in so that we can work this up. She overall is low risk. She is a former smoker but quit back in 1998. She did have a 30-pack-year history. She's not had a recent cough or shortness of breath.   Review of Systems     Objective:   Physical Exam  Constitutional: She is oriented to person, place, and time. She appears well-developed and well-nourished.  HENT:  Head: Normocephalic and atraumatic.  Eyes: Conjunctivae and EOM are normal.  Cardiovascular: Normal rate.   Pulmonary/Chest: Effort normal.  Neurological: She is alert and oriented to person, place, and time.  Skin: Skin is dry. No pallor.  Psychiatric: She has a normal mood and affect. Her behavior is normal.  Vitals reviewed.      Assessment & Plan:  Spirometry - normal today.FVC 108%, FEV1 of 125% with a ratio of 87. No significant improvement post-albuterol. Reviewed results. They're completely normal. Gave her reassurance that she does not have COPD or asthma.est results with her and her family and answered questions.

## 2015-12-08 NOTE — Progress Notes (Signed)
Patient came into clinic today for spirometry. Patient states she wake up every morning with a dry cough. Doesn't report getting short of breath with activity, but she did get some shortness of breath today while cleaning her house. Patient was able to preform spirometry successfully. She was placed in a room to go over results with PCP.

## 2015-12-30 ENCOUNTER — Other Ambulatory Visit: Payer: Self-pay

## 2015-12-30 MED ORDER — VITAMIN D 1000 UNITS PO TABS: 1000.0000 [IU] | ORAL_TABLET | Freq: Every day | ORAL | 0 refills | Status: DC

## 2015-12-30 MED ORDER — PANTOPRAZOLE SODIUM 40 MG PO TBEC: 40.0000 mg | DELAYED_RELEASE_TABLET | Freq: Every day | ORAL | 0 refills | Status: DC

## 2015-12-30 MED ORDER — ATORVASTATIN CALCIUM 40 MG PO TABS: 20.0000 mg | ORAL_TABLET | Freq: Every day | ORAL | 0 refills | Status: DC

## 2016-01-01 ENCOUNTER — Other Ambulatory Visit: Payer: Self-pay | Admitting: *Deleted

## 2016-01-01 MED ORDER — LOSARTAN POTASSIUM 25 MG PO TABS: ORAL_TABLET | ORAL | 0 refills | Status: DC

## 2016-01-01 MED ORDER — CLOPIDOGREL BISULFATE 75 MG PO TABS: 75.0000 mg | ORAL_TABLET | Freq: Every day | ORAL | 0 refills | Status: DC

## 2016-01-21 ENCOUNTER — Other Ambulatory Visit: Payer: Self-pay | Admitting: *Deleted

## 2016-01-21 MED ORDER — FLUTICASONE PROPIONATE 50 MCG/ACT NA SUSP: NASAL | 2 refills | Status: DC

## 2016-02-01 ENCOUNTER — Other Ambulatory Visit: Payer: Self-pay | Admitting: Family Medicine

## 2016-02-17 ENCOUNTER — Other Ambulatory Visit: Payer: Self-pay | Admitting: Family Medicine

## 2016-03-09 ENCOUNTER — Other Ambulatory Visit: Payer: Self-pay | Admitting: Family Medicine

## 2016-03-09 ENCOUNTER — Other Ambulatory Visit: Payer: Self-pay | Admitting: *Deleted

## 2016-04-26 ENCOUNTER — Other Ambulatory Visit: Payer: Self-pay | Admitting: Family Medicine

## 2016-05-10 ENCOUNTER — Ambulatory Visit: Payer: Medicare Other

## 2016-05-10 ENCOUNTER — Ambulatory Visit (INDEPENDENT_AMBULATORY_CARE_PROVIDER_SITE_OTHER): Payer: Medicare Other | Admitting: Family Medicine

## 2016-05-10 ENCOUNTER — Encounter: Payer: Self-pay | Admitting: Family Medicine

## 2016-05-10 VITALS — BP 132/73 | HR 87 | Wt 183.0 lb

## 2016-05-10 DIAGNOSIS — R05 Cough: Secondary | ICD-10-CM

## 2016-05-10 DIAGNOSIS — I10 Essential (primary) hypertension: Secondary | ICD-10-CM | POA: Diagnosis not present

## 2016-05-10 DIAGNOSIS — R059 Cough, unspecified: Secondary | ICD-10-CM

## 2016-05-10 MED ORDER — ATORVASTATIN CALCIUM 20 MG PO TABS: ORAL_TABLET | ORAL | 3 refills | Status: DC

## 2016-05-10 MED ORDER — LOSARTAN POTASSIUM 25 MG PO TABS: ORAL_TABLET | ORAL | 3 refills | Status: DC

## 2016-05-10 NOTE — Progress Notes (Signed)
Subjective:    CC: HTN  HPI:  Hypertension- Pt denies chest pain, SOB, dizziness, or heart palpitations.  Taking meds as directed w/o problems.  Denies medication side effects.    Has had a persistant cough since was here in March for URI.  Says feels like a tickle in her throat.  She says sometimes she'll notice it if she tilts her head back and will actually trigger a little cough. She has had some postnasal drip going on for several weeks. And she is also noticed that her reflux has been increased over the last 2 weeks. The pantoprazole was doing well controlling her symptoms. She's been on it since she had a GI bleed. But she's been eating a lot more spicy food over the last couple weeks and so has been taking some Tums for breakthrough symptoms.  Past medical history, Surgical history, Family history not pertinant except as noted below, Social history, Allergies, and medications have been entered into the medical record, reviewed, and corrections made.   Review of Systems: No fevers, chills, night sweats, weight loss, chest pain, or shortness of breath.   Objective:    General: Well Developed, well nourished, and in no acute distress.  Neuro: Alert and oriented x3, extra-ocular muscles intact, sensation grossly intact.  HEENT: Normocephalic, atraumatic, Oropharynx is clear, TMs and canals are clear bilaterally. No significant cervical lymphadenopathy.  Skin: Warm and dry, no rashes. Cardiac: Regular rate and rhythm, no murmurs rubs or gallops, no lower extremity edema.  Respiratory: Clear to auscultation bilaterally. Not using accessory muscles, speaking in full sentences.   Impression and Recommendations:    HTN - Well controlled. Continue current regimen. Due for CMP and lipid panel. Follow-up in 6 months.  Cough-suspect either secondary to postnasal drip or possibly reflux. I want her to first start on making some dietary changes to get her reflux back under control over the next  couple weeks. We could consider increasing him to his pantoprazole to twice a day if needed. Consider that it could be her losartan but cough is usually less than 1% and she was on this before the cough actually started.

## 2016-06-02 DIAGNOSIS — I10 Essential (primary) hypertension: Secondary | ICD-10-CM | POA: Diagnosis not present

## 2016-06-02 LAB — COMPLETE METABOLIC PANEL WITH GFR
ALBUMIN: 3.7 g/dL (ref 3.6–5.1)
ALK PHOS: 73 U/L (ref 33–130)
ALT: 9 U/L (ref 6–29)
AST: 17 U/L (ref 10–35)
BUN: 8 mg/dL (ref 7–25)
CALCIUM: 9.1 mg/dL (ref 8.6–10.4)
CO2: 28 mmol/L (ref 20–31)
CREATININE: 0.96 mg/dL — AB (ref 0.60–0.88)
Chloride: 108 mmol/L (ref 98–110)
GFR, EST AFRICAN AMERICAN: 64 mL/min (ref >=60)
GFR, Est Non African American: 56 mL/min — ABNORMAL LOW (ref >=60)
Glucose, Bld: 91 mg/dL (ref 65–99)
Potassium: 4.5 mmol/L (ref 3.5–5.3)
Sodium: 143 mmol/L (ref 135–146)
Total Bilirubin: 0.6 mg/dL (ref 0.2–1.2)
Total Protein: 6.4 g/dL (ref 6.1–8.1)

## 2016-06-02 LAB — LIPID PANEL W/REFLEX DIRECT LDL
CHOLESTEROL: 143 mg/dL (ref <200)
HDL: 51 mg/dL (ref >50)
LDL-Cholesterol: 72 mg/dL
Non-HDL Cholesterol (Calc): 92 mg/dL (ref <130)
TRIGLYCERIDES: 114 mg/dL (ref <150)
Total CHOL/HDL Ratio: 2.8 Ratio (ref <5.0)

## 2016-06-02 NOTE — Progress Notes (Signed)
All labs are normal. 

## 2016-08-16 ENCOUNTER — Other Ambulatory Visit: Payer: Self-pay | Admitting: Family Medicine

## 2016-09-10 ENCOUNTER — Other Ambulatory Visit: Payer: Self-pay | Admitting: Family Medicine

## 2016-10-24 ENCOUNTER — Other Ambulatory Visit: Payer: Self-pay | Admitting: Family Medicine

## 2016-11-01 DIAGNOSIS — M8589 Other specified disorders of bone density and structure, multiple sites: Secondary | ICD-10-CM | POA: Diagnosis not present

## 2016-11-01 DIAGNOSIS — Z853 Personal history of malignant neoplasm of breast: Secondary | ICD-10-CM | POA: Diagnosis not present

## 2016-11-01 DIAGNOSIS — R928 Other abnormal and inconclusive findings on diagnostic imaging of breast: Secondary | ICD-10-CM | POA: Diagnosis not present

## 2016-11-01 LAB — HM MAMMOGRAPHY

## 2016-11-01 LAB — HM DEXA SCAN

## 2016-11-07 ENCOUNTER — Telehealth: Payer: Self-pay | Admitting: Family Medicine

## 2016-11-07 NOTE — Telephone Encounter (Signed)
Call pt: bone density shows osteopenia.  Because of increased risk of fracture we could consider putting her on medication for her bones. Since she didn't do well on Fosamax could onsider the injection Prolia. It is done every 6 months.

## 2016-11-08 NOTE — Telephone Encounter (Signed)
Spoke to patient spouse gave him results as noted below. He stated that he and his wife have an appointment on Thursday and they will discuss the options during that time. Markon Jares,CMA

## 2016-11-10 ENCOUNTER — Ambulatory Visit: Payer: Medicare Other | Admitting: Family Medicine

## 2016-11-16 ENCOUNTER — Other Ambulatory Visit: Payer: Self-pay | Admitting: Family Medicine

## 2016-11-17 ENCOUNTER — Ambulatory Visit (INDEPENDENT_AMBULATORY_CARE_PROVIDER_SITE_OTHER): Payer: Medicare Other | Admitting: Family Medicine

## 2016-11-17 ENCOUNTER — Encounter: Payer: Self-pay | Admitting: Family Medicine

## 2016-11-17 VITALS — BP 134/67 | HR 55 | Ht 67.0 in | Wt 186.0 lb

## 2016-11-17 DIAGNOSIS — K2100 Gastro-esophageal reflux disease with esophagitis, without bleeding: Secondary | ICD-10-CM

## 2016-11-17 DIAGNOSIS — I714 Abdominal aortic aneurysm, without rupture, unspecified: Secondary | ICD-10-CM

## 2016-11-17 DIAGNOSIS — R059 Cough, unspecified: Secondary | ICD-10-CM

## 2016-11-17 DIAGNOSIS — R05 Cough: Secondary | ICD-10-CM

## 2016-11-17 DIAGNOSIS — I10 Essential (primary) hypertension: Secondary | ICD-10-CM

## 2016-11-17 DIAGNOSIS — Z23 Encounter for immunization: Secondary | ICD-10-CM | POA: Diagnosis not present

## 2016-11-17 LAB — BASIC METABOLIC PANEL WITH GFR
BUN/Creatinine Ratio: 11 (calc) (ref 6–22)
BUN: 10 mg/dL (ref 7–25)
CALCIUM: 9.4 mg/dL (ref 8.6–10.4)
CO2: 26 mmol/L (ref 20–32)
CREATININE: 0.93 mg/dL — AB (ref 0.60–0.88)
Chloride: 104 mmol/L (ref 98–110)
GFR, EST NON AFRICAN AMERICAN: 58 mL/min/{1.73_m2} — AB (ref >=60)
GFR, Est African American: 67 mL/min/{1.73_m2} (ref >=60)
Glucose, Bld: 94 mg/dL (ref 65–99)
POTASSIUM: 4.4 mmol/L (ref 3.5–5.3)
Sodium: 136 mmol/L (ref 135–146)

## 2016-11-17 NOTE — Progress Notes (Signed)
Subjective:    Patient ID: Michelle [name], female    DOB: 08-Jul-1932, 83 y.o.   MRN: [id number]  HPI  Hypertension- Pt denies chest pain, SOB, dizziness, or heart palpitations.  Taking meds as directed w/o problems.  Denies medication side effects.    GERD-she was put on Protonix about a year ago when she had a GI bleed. At the time she was taking Plavix, aspirin, and NSAID. She's been on since that time it was reading the package insert where it warned against taking the medication for greater than a year and wanted to discuss that today.  She also says that sometimes she wakes up just a mild cough first thing in the morning. Sometimes are still little phlegm present and wants to know what that could be.  She reminded me today that she is actually due for repeat ultrasound of the abdomen. 3 years ago on 11/06/2013 she had a CT of the abdomen and pelvis which revealed a 3 cm infrarenal abdominal aortic aneurysm. Recommendation was to repeat ultrasound in 3 years to monitor change in size of the aneurysm.  Review of Systems  BP 134/67   Pulse (!) 55   Ht 5' 7"  (1.702 m)   Wt 186 lb (84.4 kg)   SpO2 99%   BMI 29.13 kg/m     Allergies  Allergen Reactions  . Benadryl [Diphenhydramine Hcl]     Hyper  . Doxycycline Other (See Comments)    acid reflux  . Simvastatin Other (See Comments)    REACTION: memory loss  . Fosamax [Alendronate Sodium] Other (See Comments)    GERD  . Lisinopril Other (See Comments)    Dry cough  . Nsaids Other (See Comments)    GI Bleed    Past Medical History:  Diagnosis Date  . AAA (abdominal aortic aneurysm) ([place]) 10/2013  . Basal cell carcinoma 2011   R back  . Bradycardia   . Breast cancer ([place]) 02/2011   US-guided biopsy  . Cataract   . Endometrial cancer ([place]) 05/2013   uterine ca  . GERD (gastroesophageal reflux disease)   . Hiatal hernia 2008  . History of breast cancer   . History of cerebrovascular accident 2009  . Hyperlipidemia   .  Hypertension 2012  . Macular degeneration   . Polyp of colon   . Shingles 2008  . Squamous cell carcinoma    2010-2015  . Stroke ([place]) 09/2007   Slight memory problems residual  . Tendonitis    rt hand  . Vocal cord polyp     Past Surgical History:  Procedure Laterality Date  . BASAL CELL CARCINOMA EXCISION    . biospy  10/28/09   Shave Biospy skin Left hand(Acitinic Keratoses), Right Upper Arm (superficial Basal Cell), Right Upper Back(Superficial Basal Cell), Left Neck ( Solar Lentigo & Seborrheic Keratoses)  . BREAST BIOPSY  02/11/11   Right Breast Needle Core Biospy - Upper Outer Quadrant; ER/PR 100%, Her-2 Neu neg.; Ki-67 10%  . BREAST MAMMOSITE  03/14/2011   Procedure: MAMMOSITE BREAST;  Surgeon: Rolm Bookbinder, MD;  Location: [facility];  Service: General;  Laterality: Right;  . BREAST SURGERY     right breast lumpectomy snbx  . CHOLECYSTECTOMY  2004  . COLONOSCOPY    . COLONOSCOPY W/ POLYPECTOMY     2008, 2014 (benign)  . COLONOSCOPY WITH PROPOFOL N/A 07/09/2015   Procedure: COLONOSCOPY WITH PROPOFOL;  Surgeon: Wonda Horner, MD;  Location: [facility];  Service: Endoscopy;  Laterality: N/A;  . DILATION AND CURETTAGE OF UTERUS N/A 04/24/2013   Procedure: DILATATION AND CURETTAGE;  Surgeon: Guss Bunde, MD;  Location: [facility];  Service: Gynecology;  Laterality: N/A;  . ESOPHAGOGASTRODUODENOSCOPY N/A 07/06/2015   Procedure: ESOPHAGOGASTRODUODENOSCOPY (EGD);  Surgeon: Wilford Corner, MD;  Location[facility];  Service: Endoscopy;  Laterality: N/A;  . HYSTEROSCOPY N/A 04/24/2013   Procedure: HYSTEROSCOPY;  Surgeon: Guss Bunde, MD;  Location: [facility];  Service: Gynecology;  Laterality: N/A;  . ROBOTIC ASSISTED TOTAL HYSTERECTOMY WITH BILATERAL SALPINGO OOPHERECTOMY Bilateral 06/11/2013   Procedure: ROBOTIC ASSISTED TOTAL HYSTERECTOMY WITH BILATERAL SALPINGO OOPHORECTOMY WITH POSSIBLE STAGING, EPISIOTOMY;  Surgeon: Janie Morning, MD;  Location: [facility];   Service: Gynecology;  Laterality: Bilateral;  . SQUAMOUS CELL CARCINOMA EXCISION    . TEAR DUCT PROBING    . TONSILLECTOMY    . vocal cord poylps  1969   excision    Social History   Social History  . Marital status: Married    Spouse name: N/A  . Number of children: N/A  . Years of education: N/A   Occupational History  . retired    Social History Main Topics  . Smoking status: Former Smoker    Packs/day: 1.00    Years: 30.00    Quit date: 02/01/1996  . Smokeless tobacco: Never Used  . Alcohol use 0.6 oz/week    1 Glasses of wine per week     Comment: Ocassionaly once a year .  Marland Kitchen Drug use: No  . Sexual activity: Yes    Partners: Male     Comment: Menarche age 73, menopause age 47, HRT x 1year   Other Topics Concern  . Not on file   Social History Narrative  . No narrative on file    Family History  Problem Relation Age of Onset  . Heart disease Father   . Cancer Sister 67       Lymphoma  . Cancer Paternal Aunt        Breast cancer  . Cancer Daughter 84       Bilateral Breast Cancer  . Cancer Daughter        Breast Cancer  . Cancer Sister        2005 - Renal Cell Cancer and Breast Cancer    Outpatient Encounter Prescriptions as of 11/17/2016  Medication Sig  . acetaminophen (TYLENOL) 325 MG tablet Take 650 mg by mouth as needed for mild pain.   Marland Kitchen atorvastatin (LIPITOR) 20 MG tablet 1 tab po every other night.  . clopidogrel (PLAVIX) 75 MG tablet TAKE 1 TABLET BY MOUTH  DAILY  . fluticasone (FLONASE) 50 MCG/ACT nasal spray INSTILL 2 SPRAYS IN EACH  NOSTRIL EVERY DAY (Patient taking differently: INSTILL 2 SPRAYS IN EACH  NOSTRIL EVERY DAY, or PRN)  . losartan (COZAAR) 25 MG tablet TAKE 1 TABLET BY MOUTH  DAILY  . Multiple Vitamins-Minerals (ICAPS AREDS 2 PO) Take by mouth.  . pantoprazole (PROTONIX) 40 MG tablet TAKE 1 TABLET BY MOUTH  DAILY  . VIACTIV [id number] MG-UNT-MCG CHEW CHEW 1 TABLET BY MOUTH EVERY MORNING.  . [DISCONTINUED] diclofenac sodium  (VOLTAREN) 1 % GEL Apply 2 g topically 4 (four) times daily.  . [DISCONTINUED] cholecalciferol (VITAMIN D) 1000 units tablet Take 1 tablet (1,000 Units total) by mouth daily.  . [DISCONTINUED] diclofenac (VOLTAREN) 0.1 % ophthalmic solution 4 (four) times daily.   No facility-administered encounter medications on file as of 11/17/2016.  Objective:   Physical Exam  Constitutional: She is oriented to person, place, and time. She appears well-developed and well-nourished.  HENT:  Head: Normocephalic and atraumatic.  Cardiovascular: Normal rate, regular rhythm and normal heart sounds.   Pulmonary/Chest: Effort normal and breath sounds normal.  Neurological: She is alert and oriented to person, place, and time.  Skin: Skin is warm and dry.  Psychiatric: She has a normal mood and affect. Her behavior is normal.          Assessment & Plan:  HTN - Well controlled. Continue current regimen. Follow up in  6 months.    GERD-We discussed trying to wean the PPI down to every other day for the next couple months and if she does well we can step down to an H2 blocker or switch to every third day. She was on multiple agents that can increase her risk for GI bleeding when the GI bleed occurred now she is just on Plavix.  Cough- Explained it can be postnasal drip versus GERD. Just exam is clear. No other worrisome symptoms at this time.  Follow-up abdominal aortic aneurysm-will place order for [place] for monitoring.

## 2016-11-17 NOTE — Patient Instructions (Signed)
Try to decrease the pantoprazole down to 1 tab every other day.

## 2016-11-18 ENCOUNTER — Encounter: Payer: Self-pay | Admitting: Family Medicine

## 2016-11-18 DIAGNOSIS — I714 Abdominal aortic aneurysm, without rupture, unspecified: Secondary | ICD-10-CM | POA: Insufficient documentation

## 2016-11-18 HISTORY — DX: Abdominal aortic aneurysm, without rupture, unspecified: I71.40

## 2016-11-28 ENCOUNTER — Telehealth: Payer: Self-pay

## 2016-11-28 NOTE — Telephone Encounter (Signed)
Pt called and stated that Saturday afternoon did not feel well.  Went to bed around 5:30.  Around 7, and about an hour or so after vomited twice, "very forcefully".  Felt like her vision was disturbed, and balance issues.  Pt took her BP and she said it was a little high upper 150s /60s.  Pt stated that all of her symptoms have resolved, but is questioning if she needs an appointment to be seen.  I went over symptoms to seek immediate attention.  Please advise.

## 2016-11-29 ENCOUNTER — Encounter: Payer: Self-pay | Admitting: Family Medicine

## 2016-11-29 ENCOUNTER — Ambulatory Visit (INDEPENDENT_AMBULATORY_CARE_PROVIDER_SITE_OTHER): Payer: Medicare Other | Admitting: Family Medicine

## 2016-11-29 VITALS — BP 141/67 | HR 55 | Ht 67.0 in | Wt 182.0 lb

## 2016-11-29 DIAGNOSIS — R2681 Unsteadiness on feet: Secondary | ICD-10-CM | POA: Diagnosis not present

## 2016-11-29 DIAGNOSIS — R112 Nausea with vomiting, unspecified: Secondary | ICD-10-CM

## 2016-11-29 NOTE — Telephone Encounter (Signed)
She has made appt for today.

## 2016-11-29 NOTE — Patient Instructions (Addendum)
Call if not better on Friday and will get labs if needed  Try to get your bowels needed.

## 2016-11-29 NOTE — Progress Notes (Signed)
Subjective:    Patient ID: Michelle [name], female    DOB: Aug 20, 1932, 83 y.o.   MRN: [id number]  HPI 83 year old female comes in today for not feeling well.  On Saturday afternoon, approximately 4 days ago she went to bed very early that evening because she just did not feel quite well.  Then about an hour and a half later she actually woke up and vomited twice very forcefully.  She actually felt like her vision was off and felt like her balance was off as well.  She took her blood pressure and her systolic pressure was in the upper 150s over 60s.  She actually feels somewhat better now. Still feels a little off balance.     Review of Systems   BP (!) 141/67   Pulse (!) 55   Ht _0  (1.702 m)   Wt 182 lb (82.6 kg)   BMI 28.51 kg/m     Allergies  Allergen Reactions  . Benadryl [Diphenhydramine Hcl]     Hyper  . Doxycycline Other (See Comments)    acid reflux  . Simvastatin Other (See Comments)    REACTION: memory loss  . Fosamax [Alendronate Sodium] Other (See Comments)    GERD  . Lisinopril Other (See Comments)    Dry cough  . Nsaids Other (See Comments)    GI Bleed    Past Medical History:  Diagnosis Date  . AAA (abdominal aortic aneurysm) ([place]) 10/2013  . Basal cell carcinoma 2011   R back  . Bradycardia   . Breast cancer ([place]) 02/2011   US-guided biopsy  . Cataract   . Endometrial cancer ([place]) 05/2013   uterine ca  . GERD (gastroesophageal reflux disease)   . Hiatal hernia 2008  . History of breast cancer   . History of cerebrovascular accident 2009  . Hyperlipidemia   . Hypertension 2012  . Macular degeneration   . Polyp of colon   . Shingles 2008  . Squamous cell carcinoma    2010-2015  . Stroke ([place]) 09/2007   Slight memory problems residual  . Tendonitis    rt hand  . Vocal cord polyp     Past Surgical History:  Procedure Laterality Date  . BASAL CELL CARCINOMA EXCISION    . biospy  10/28/09   Shave Biospy skin Left hand(Acitinic Keratoses),  Right Upper Arm (superficial Basal Cell), Right Upper Back(Superficial Basal Cell), Left Neck ( Solar Lentigo & Seborrheic Keratoses)  . BREAST BIOPSY  02/11/11   Right Breast Needle Core Biospy - Upper Outer Quadrant; ER/PR 100%, Her-2 Neu neg.; Ki-67 10%  . BREAST MAMMOSITE  03/14/2011   Procedure: MAMMOSITE BREAST;  Surgeon: Rolm Bookbinder, MD;  Location: [facility];  Service: General;  Laterality: Right;  . BREAST SURGERY     right breast lumpectomy snbx  . CHOLECYSTECTOMY  2004  . COLONOSCOPY    . COLONOSCOPY W/ POLYPECTOMY     2008, 2014 (benign)  . COLONOSCOPY WITH PROPOFOL N/A 07/09/2015   Procedure: COLONOSCOPY WITH PROPOFOL;  Surgeon: Wonda Horner, MD;  Location: [facility] ENDOSCOPY;  Service: Endoscopy;  Laterality: N/A;  . DILATION AND CURETTAGE OF UTERUS N/A 04/24/2013   Procedure: DILATATION AND CURETTAGE;  Surgeon: Guss Bunde, MD;  Location: [facility];  Service: Gynecology;  Laterality: N/A;  . ESOPHAGOGASTRODUODENOSCOPY N/A 07/06/2015   Procedure: ESOPHAGOGASTRODUODENOSCOPY (EGD);  Surgeon: Wilford Corner, MD;  Location: [facility]OSCOPY;  Service: Endoscopy;  Laterality: N/A;  . HYSTEROSCOPY N/A 04/24/2013  Procedure: HYSTEROSCOPY;  Surgeon: Guss Bunde, MD;  Location: [facility];  Service: Gynecology;  Laterality: N/A;  . ROBOTIC ASSISTED TOTAL HYSTERECTOMY WITH BILATERAL SALPINGO OOPHERECTOMY Bilateral 06/11/2013   Procedure: ROBOTIC ASSISTED TOTAL HYSTERECTOMY WITH BILATERAL SALPINGO OOPHORECTOMY WITH POSSIBLE STAGING, EPISIOTOMY;  Surgeon: Janie Morning, MD;  Location: [facility];  Service: Gynecology;  Laterality: Bilateral;  . SQUAMOUS CELL CARCINOMA EXCISION    . TEAR DUCT PROBING    . TONSILLECTOMY    . vocal cord poylps  1969   excision    Social History   Social History  . Marital status: Married    Spouse name: N/A  . Number of children: N/A  . Years of education: N/A   Occupational History  . retired    Social History Main Topics  . Smoking  status: Former Smoker    Packs/day: 1.00    Years: 30.00    Quit date: 02/01/1996  . Smokeless tobacco: Never Used  . Alcohol use 0.6 oz/week    1 Glasses of wine per week     Comment: Ocassionaly once a year .  Marland Kitchen Drug use: No  . Sexual activity: Yes    Partners: Male     Comment: Menarche age 52, menopause age 75, HRT x 1year   Other Topics Concern  . Not on file   Social History Narrative  . No narrative on file    Family History  Problem Relation Age of Onset  . Heart disease Father   . Cancer Sister 38       Lymphoma  . Cancer Paternal Aunt        Breast cancer  . Cancer Daughter 76       Bilateral Breast Cancer  . Cancer Daughter        Breast Cancer  . Cancer Sister        2005 - Renal Cell Cancer and Breast Cancer    Outpatient Encounter Prescriptions as of 11/29/2016  Medication Sig  . acetaminophen (TYLENOL) 325 MG tablet Take 650 mg by mouth as needed for mild pain.   Marland Kitchen atorvastatin (LIPITOR) 20 MG tablet 1 tab po every other night.  . clopidogrel (PLAVIX) 75 MG tablet TAKE 1 TABLET BY MOUTH  DAILY  . fluticasone (FLONASE) 50 MCG/ACT nasal spray INSTILL 2 SPRAYS IN EACH  NOSTRIL EVERY DAY (Patient taking differently: INSTILL 2 SPRAYS IN EACH  NOSTRIL EVERY DAY, or PRN)  . losartan (COZAAR) 25 MG tablet TAKE 1 TABLET BY MOUTH  DAILY  . Multiple Vitamins-Minerals (ICAPS AREDS 2 PO) Take by mouth.  . pantoprazole (PROTONIX) 40 MG tablet TAKE 1 TABLET BY MOUTH  DAILY  . VIACTIV [id number] MG-UNT-MCG CHEW CHEW 1 TABLET BY MOUTH EVERY MORNING.   No facility-administered encounter medications on file as of 11/29/2016.          Objective:   Physical Exam  Constitutional: She is oriented to person, place, and time. She appears well-developed and well-nourished.  HENT:  Head: Normocephalic and atraumatic.  Right Ear: External ear normal.  Left Ear: External ear normal.  Nose: Nose normal.  Mouth/Throat: Oropharynx is clear and moist.  TMs and canals are  clear.   Eyes: Pupils are equal, round, and reactive to light. Conjunctivae and EOM are normal.  Neck: Neck supple. No thyromegaly present.  Cardiovascular: Normal rate, regular rhythm and normal heart sounds.   Pulmonary/Chest: Effort normal and breath sounds normal. She has no wheezes.  Abdominal: Soft. Bowel sounds are normal. She  exhibits no distension and no mass. There is no tenderness. There is no rebound and no guarding.  Musculoskeletal:  Negative dix-hallpike manuver  Lymphadenopathy:    She has no cervical adenopathy.  Neurological: She is alert and oriented to person, place, and time. She has normal reflexes.  Skin: Skin is warm and dry.  Psychiatric: She has a normal mood and affect.        Assessment & Plan:  Off balance/unsteady gait - no sign of positional vertigo.  Negative Dix-Hallpike maneuver.  Reflexes are symmetric.  She is walking normally.  Really encourage hydration and again if not feeling better by Friday then give [place] call back.  Nausea /vomiting-seems to have resolved as far as the vomiting component.  It really was short-lived.  But she still feeling just a little bit of nausea.  We discussed the brat diet and advancing the diet slowly as tolerated.  If she still feeling nauseated by Friday then I want her to give me a call back we can also do blood work.

## 2016-12-01 DIAGNOSIS — H2511 Age-related nuclear cataract, right eye: Secondary | ICD-10-CM | POA: Diagnosis not present

## 2016-12-01 DIAGNOSIS — H40013 Open angle with borderline findings, low risk, bilateral: Secondary | ICD-10-CM | POA: Diagnosis not present

## 2016-12-01 DIAGNOSIS — H2513 Age-related nuclear cataract, bilateral: Secondary | ICD-10-CM | POA: Diagnosis not present

## 2016-12-01 DIAGNOSIS — H25013 Cortical age-related cataract, bilateral: Secondary | ICD-10-CM | POA: Diagnosis not present

## 2016-12-01 DIAGNOSIS — H353132 Nonexudative age-related macular degeneration, bilateral, intermediate dry stage: Secondary | ICD-10-CM | POA: Diagnosis not present

## 2016-12-13 DIAGNOSIS — H25811 Combined forms of age-related cataract, right eye: Secondary | ICD-10-CM | POA: Diagnosis not present

## 2016-12-13 DIAGNOSIS — H2511 Age-related nuclear cataract, right eye: Secondary | ICD-10-CM | POA: Diagnosis not present

## 2016-12-20 ENCOUNTER — Ambulatory Visit (HOSPITAL_COMMUNITY)
Admission: RE | Admit: 2016-12-20 | Discharge: 2016-12-20 | Disposition: A | Discharge status: Home / Self Care | Payer: Medicare Other | Source: Ambulatory Visit | Attending: Surgery | Admitting: Surgery

## 2016-12-20 DIAGNOSIS — I714 Abdominal aortic aneurysm, without rupture, unspecified: Secondary | ICD-10-CM

## 2016-12-26 ENCOUNTER — Telehealth: Payer: Self-pay

## 2016-12-26 NOTE — Telephone Encounter (Signed)
Pt called and wanted to know what her results were from her Vas [place]. Results were not sent directly to Dr. Madilyn Fireman but she was able to find them. Pt was informed of her results and advised to repeat in 2 years per Dr. Madilyn Fireman.

## 2017-01-06 DIAGNOSIS — H2512 Age-related nuclear cataract, left eye: Secondary | ICD-10-CM | POA: Diagnosis not present

## 2017-01-06 DIAGNOSIS — H25012 Cortical age-related cataract, left eye: Secondary | ICD-10-CM | POA: Diagnosis not present

## 2017-01-17 DIAGNOSIS — H2512 Age-related nuclear cataract, left eye: Secondary | ICD-10-CM | POA: Diagnosis not present

## 2017-01-17 DIAGNOSIS — H25812 Combined forms of age-related cataract, left eye: Secondary | ICD-10-CM | POA: Diagnosis not present

## 2017-02-22 DIAGNOSIS — H353132 Nonexudative age-related macular degeneration, bilateral, intermediate dry stage: Secondary | ICD-10-CM | POA: Diagnosis not present

## 2017-03-06 ENCOUNTER — Other Ambulatory Visit: Payer: Self-pay | Admitting: Family Medicine

## 2017-03-10 DIAGNOSIS — H04123 Dry eye syndrome of bilateral lacrimal glands: Secondary | ICD-10-CM | POA: Diagnosis not present

## 2017-03-10 DIAGNOSIS — H16223 Keratoconjunctivitis sicca, not specified as Sjogren's, bilateral: Secondary | ICD-10-CM | POA: Diagnosis not present

## 2017-03-10 DIAGNOSIS — H01009 Unspecified blepharitis unspecified eye, unspecified eyelid: Secondary | ICD-10-CM | POA: Diagnosis not present

## 2017-03-10 DIAGNOSIS — L718 Other rosacea: Secondary | ICD-10-CM | POA: Diagnosis not present

## 2017-03-24 DIAGNOSIS — H353132 Nonexudative age-related macular degeneration, bilateral, intermediate dry stage: Secondary | ICD-10-CM | POA: Diagnosis not present

## 2017-04-23 DIAGNOSIS — H353132 Nonexudative age-related macular degeneration, bilateral, intermediate dry stage: Secondary | ICD-10-CM | POA: Diagnosis not present

## 2017-05-18 ENCOUNTER — Ambulatory Visit: Payer: Medicare Other | Admitting: Family Medicine

## 2017-05-19 DIAGNOSIS — H04123 Dry eye syndrome of bilateral lacrimal glands: Secondary | ICD-10-CM | POA: Diagnosis not present

## 2017-05-19 DIAGNOSIS — L718 Other rosacea: Secondary | ICD-10-CM | POA: Diagnosis not present

## 2017-05-19 DIAGNOSIS — H40013 Open angle with borderline findings, low risk, bilateral: Secondary | ICD-10-CM | POA: Diagnosis not present

## 2017-05-19 DIAGNOSIS — H16223 Keratoconjunctivitis sicca, not specified as Sjogren's, bilateral: Secondary | ICD-10-CM | POA: Diagnosis not present

## 2017-05-20 ENCOUNTER — Other Ambulatory Visit: Payer: Self-pay | Admitting: Family Medicine

## 2017-05-23 DIAGNOSIS — H353132 Nonexudative age-related macular degeneration, bilateral, intermediate dry stage: Secondary | ICD-10-CM | POA: Diagnosis not present

## 2017-05-25 ENCOUNTER — Ambulatory Visit (INDEPENDENT_AMBULATORY_CARE_PROVIDER_SITE_OTHER): Payer: Medicare Other | Admitting: Family Medicine

## 2017-05-25 ENCOUNTER — Ambulatory Visit (INDEPENDENT_AMBULATORY_CARE_PROVIDER_SITE_OTHER): Payer: Medicare Other

## 2017-05-25 ENCOUNTER — Encounter: Payer: Self-pay | Admitting: Family Medicine

## 2017-05-25 ENCOUNTER — Telehealth: Payer: Self-pay | Admitting: Family Medicine

## 2017-05-25 VITALS — BP 128/55 | HR 56 | Ht 67.0 in | Wt 184.0 lb

## 2017-05-25 DIAGNOSIS — M25562 Pain in left knee: Principal | ICD-10-CM

## 2017-05-25 DIAGNOSIS — K219 Gastro-esophageal reflux disease without esophagitis: Secondary | ICD-10-CM | POA: Diagnosis not present

## 2017-05-25 DIAGNOSIS — G8929 Other chronic pain: Secondary | ICD-10-CM

## 2017-05-25 DIAGNOSIS — M1712 Unilateral primary osteoarthritis, left knee: Secondary | ICD-10-CM | POA: Diagnosis not present

## 2017-05-25 DIAGNOSIS — R6889 Other general symptoms and signs: Secondary | ICD-10-CM

## 2017-05-25 DIAGNOSIS — M25561 Pain in right knee: Secondary | ICD-10-CM | POA: Diagnosis not present

## 2017-05-25 DIAGNOSIS — I10 Essential (primary) hypertension: Secondary | ICD-10-CM

## 2017-05-25 MED ORDER — RANITIDINE HCL 150 MG PO TABS: 150.0000 mg | ORAL_TABLET | Freq: Two times a day (BID) | ORAL | 3 refills | Status: DC

## 2017-05-25 NOTE — Telephone Encounter (Signed)
Call pt: I will order ABIs of leg to check blood flow.  She had this done 4 years ago.  Want to check again since feet are cold.

## 2017-05-25 NOTE — Progress Notes (Signed)
Subjective:    CC: HTN   HPI: Hypertension- Pt denies chest pain, SOB, dizziness, or heart palpitations.  Taking meds as directed w/o problems.  Denies medication side effects.    Follow-up GERD-she did try to come off of her pantoprazole but says when she went to every other day she started to have a lot of stomach pain.  So she went back to taking it regularly.  She knows there is certain triggers like [place] food which the eat a lot of.  She does want to try to come off though because of the potential complications from long-term use of PPIs.  He also complains of bilateral knee pain with the left bothering her more than the right.  Most of her pain is more medial and a little bit below the patella.  She says it really does not bother her during the day when she is up and active and moving.  It bothers her more at night when she gets ready to lay down.  She does report that putting a pillow between her knees does seem to relieve some of the pain.  She says her feet sometimes feel like they go numb and feel cold at times.  She also reports some intermittent ankle swelling.  He also complains that her urine has a bright yellow appearance.  No blood or dark brown looking.  No dysuria or hematuria.  No discomfort when she urinates.  She says is been going on for about 6 months and if she drinks more water it becomes a more light yellow.  Past medical history, Surgical history, Family history not pertinant except as noted below, Social history, Allergies, and medications have been entered into the medical record, reviewed, and corrections made.   Review of Systems: No fevers, chills, night sweats, weight loss, chest pain, or shortness of breath.   Objective:    General: Well Developed, well nourished, and in no acute distress.  Neuro: Alert and oriented x3, extra-ocular muscles intact, sensation grossly intact.  HEENT: Normocephalic, atraumatic  Skin: Warm and dry, no rashes. Cardiac: Regular  rate and rhythm, no murmurs rubs or gallops, no lower extremity edema.  Respiratory: Clear to auscultation bilaterally. Not using accessory muscles, speaking in full sentences. MSK: Left knee is tender along the medial joint line of the left knee.  NROM bilaterally with 1+ patellar reflexes  Negative McMurray bilat.  Strength is 5/5 in the hip, knee and ankles.    Impression and Recommendations:    HTN - Well controlled. Continue current regimen. Follow up in  6 months.   GERD - we discussed options.  Like to at least try to put her on an H2 blocker.  We will try to switch her to ranitidine 150 mg twice a day and if she does well on that and we can hopefully cut back to maybe 75 mg twice a day.  We will try to get her off of the PPI.  Bilateral knee pain -discussed options.  For now recommend using Tylenol.  Recommend working on strengthening her upper thighs.  She has difficulty getting up from a sitting position.  Cold feet - Foot exam normal today.  Will monitor. Consider ABIs for further workup.  Had normal ABI in 2015.    Yellow urine-gave reassurance.  This sounds completely benign.  It does not sound worrisome in any way but I am happy to do a urinalysis at any point if she would like.

## 2017-05-26 NOTE — Telephone Encounter (Signed)
Pt's husband advised, he will have her contact clinic with any questions. Order printed for scheduling.

## 2017-06-06 ENCOUNTER — Ambulatory Visit (HOSPITAL_COMMUNITY)
Admission: RE | Admit: 2017-06-06 | Discharge: 2017-06-06 | Disposition: A | Discharge status: Home / Self Care | Payer: Medicare Other | Source: Ambulatory Visit | Attending: Family Medicine | Admitting: Family Medicine

## 2017-06-06 DIAGNOSIS — R6889 Other general symptoms and signs: Secondary | ICD-10-CM | POA: Insufficient documentation

## 2017-06-06 NOTE — Progress Notes (Signed)
VASCULAR LAB PRELIMINARY  ARTERIAL  ABI completed:    RIGHT    LEFT    PRESSURE WAVEFORM  PRESSURE WAVEFORM  BRACHIAL Unable to obtain due to lymphedema Triphasic BRACHIAL 137 Triphasic  DP 155 Triphasic DP 159 Triphasic  AT   AT    PT 152 Triphasic PT 166 Triphasic  PER   PER    GREAT TOE  NA GREAT TOE  NA    RIGHT LEFT  ABI 1.13 1.21   Bilateral ABIs are within normal limits at rest bilaterally.   06/06/2017 10:55 AM Maudry Mayhew, BS, RVT, RDCS, RDMS

## 2017-06-22 DIAGNOSIS — H353132 Nonexudative age-related macular degeneration, bilateral, intermediate dry stage: Secondary | ICD-10-CM | POA: Diagnosis not present

## 2017-07-22 DIAGNOSIS — H353132 Nonexudative age-related macular degeneration, bilateral, intermediate dry stage: Secondary | ICD-10-CM | POA: Diagnosis not present

## 2017-08-09 DIAGNOSIS — H029 Unspecified disorder of eyelid: Secondary | ICD-10-CM | POA: Diagnosis not present

## 2017-08-09 DIAGNOSIS — H35033 Hypertensive retinopathy, bilateral: Secondary | ICD-10-CM | POA: Diagnosis not present

## 2017-08-09 DIAGNOSIS — H3509 Other intraretinal microvascular abnormalities: Secondary | ICD-10-CM | POA: Diagnosis not present

## 2017-08-09 DIAGNOSIS — H353132 Nonexudative age-related macular degeneration, bilateral, intermediate dry stage: Secondary | ICD-10-CM | POA: Diagnosis not present

## 2017-08-09 DIAGNOSIS — H40013 Open angle with borderline findings, low risk, bilateral: Secondary | ICD-10-CM | POA: Diagnosis not present

## 2017-08-14 IMAGING — DX DG KNEE 1-2V*L*
1 series · 1 of 1 positions shown · non-contrast
Comparison: None in PACs

CLINICAL DATA: Bilateral AP standing views of both knees with right
lateral and sunrise views of the right knee.

EXAM:
LEFT KNEE - 1-2 VIEW; RIGHT KNEE - COMPLETE 4+ VIEW

[knee ap]
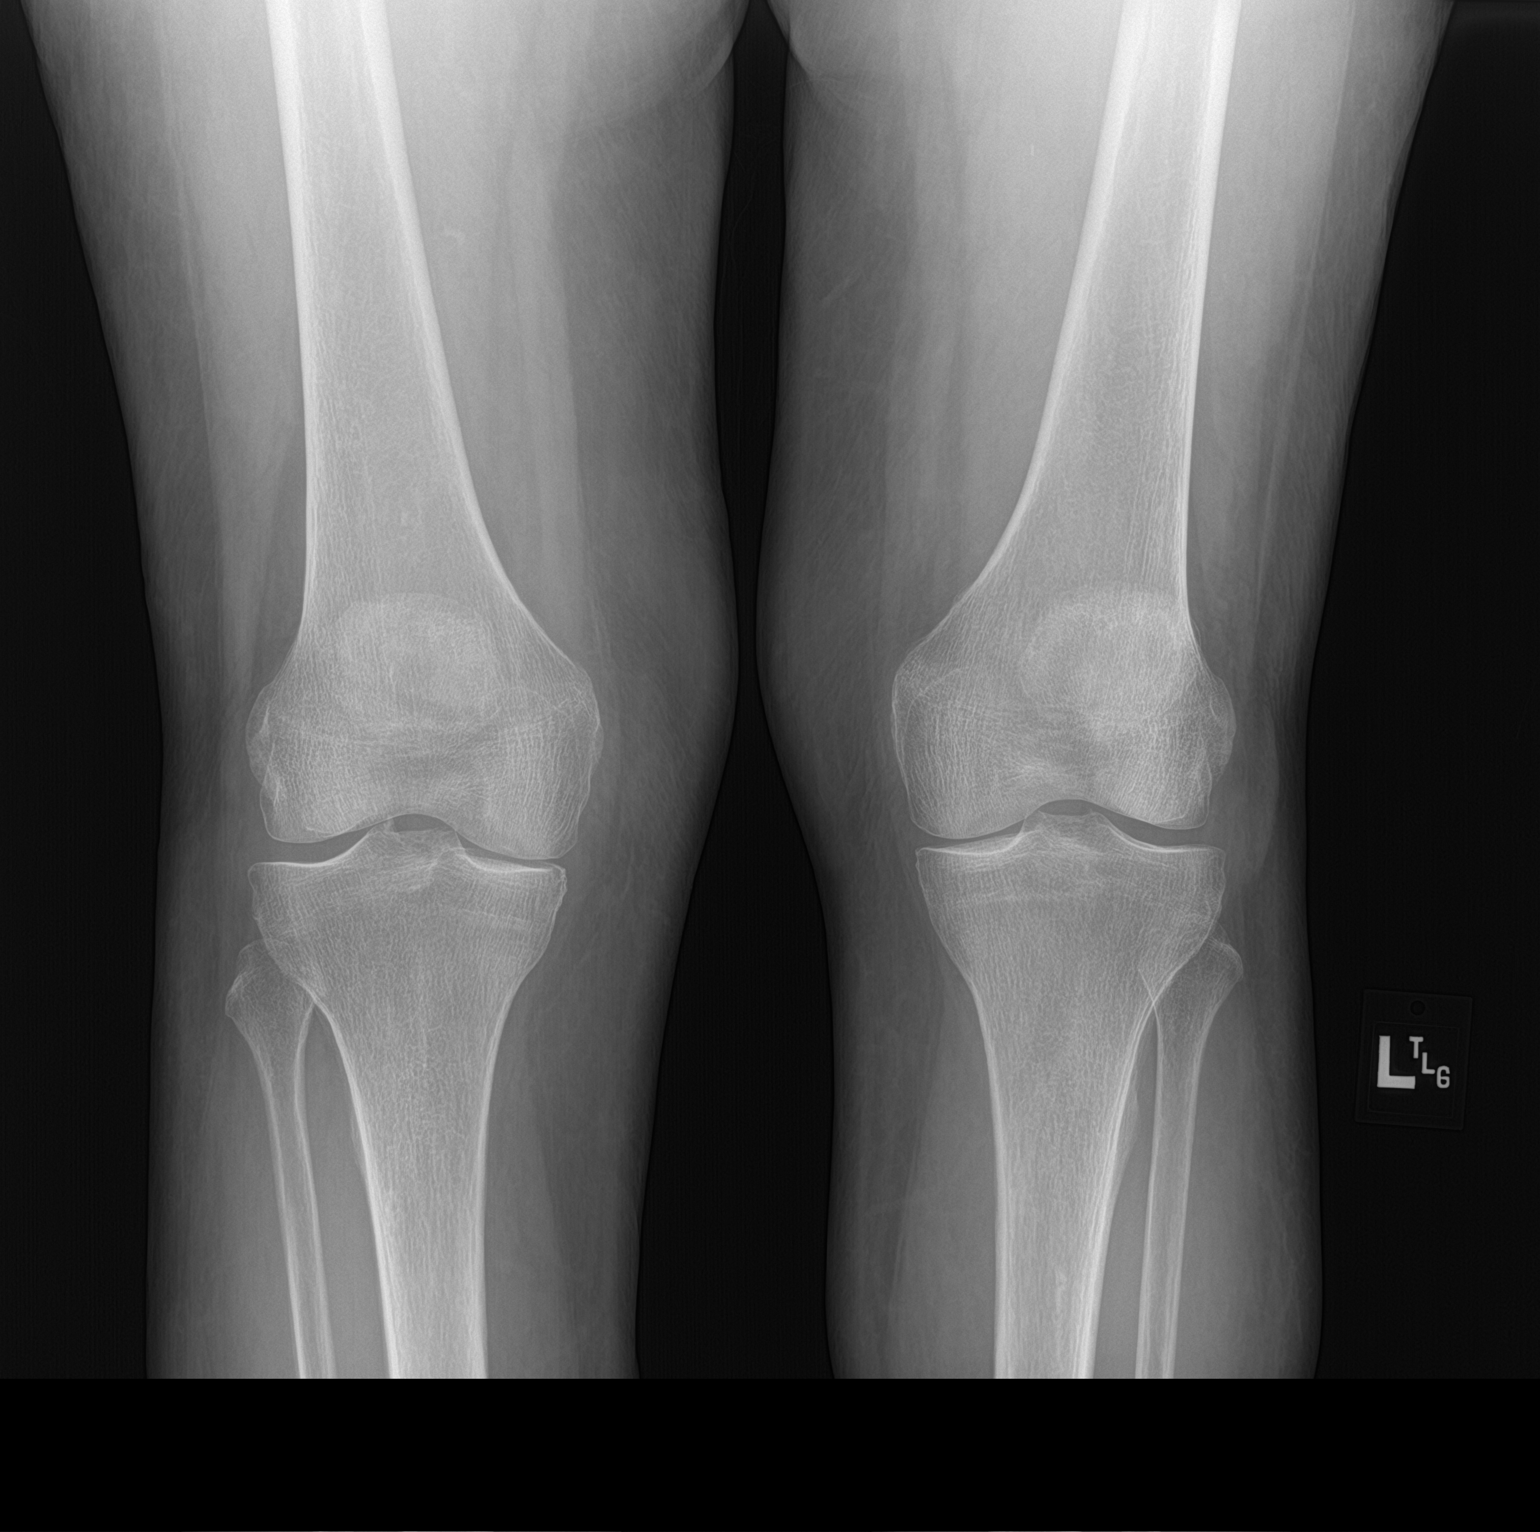

[1 of 1 positions shown; findings below may reference images not displayed]

FINDINGS: The bones are adequately mineralized. There is narrowing of the
medial joint compartments bilaterally greatest on the left. The
lateral compartments are preserved. There is mild narrowing of the
lateral aspect of the right patellofemoral compartment. There is
beaking of the tibial spines on the right. There is a small spur
arising from the superior and lateral articular margins of the
patella. There is no joint effusion. There is no acute fracture nor
dislocation.
IMPRESSION: There are degenerative changes of both knees centered on the medial
joint compartments. In addition on the right there is spurring of
the tibial spines and the articular margins of the patella with mild
narrowing of the lateral aspect of the patellofemoral joint.

## 2017-08-14 IMAGING — DX DG KNEE COMPLETE 4+V*R*
3 series · 3 of 3 positions shown · non-contrast
Comparison: None in PACs

CLINICAL DATA: Bilateral AP standing views of both knees with right
lateral and sunrise views of the right knee.

EXAM:
LEFT KNEE - 1-2 VIEW; RIGHT KNEE - COMPLETE 4+ VIEW

[tunnel]
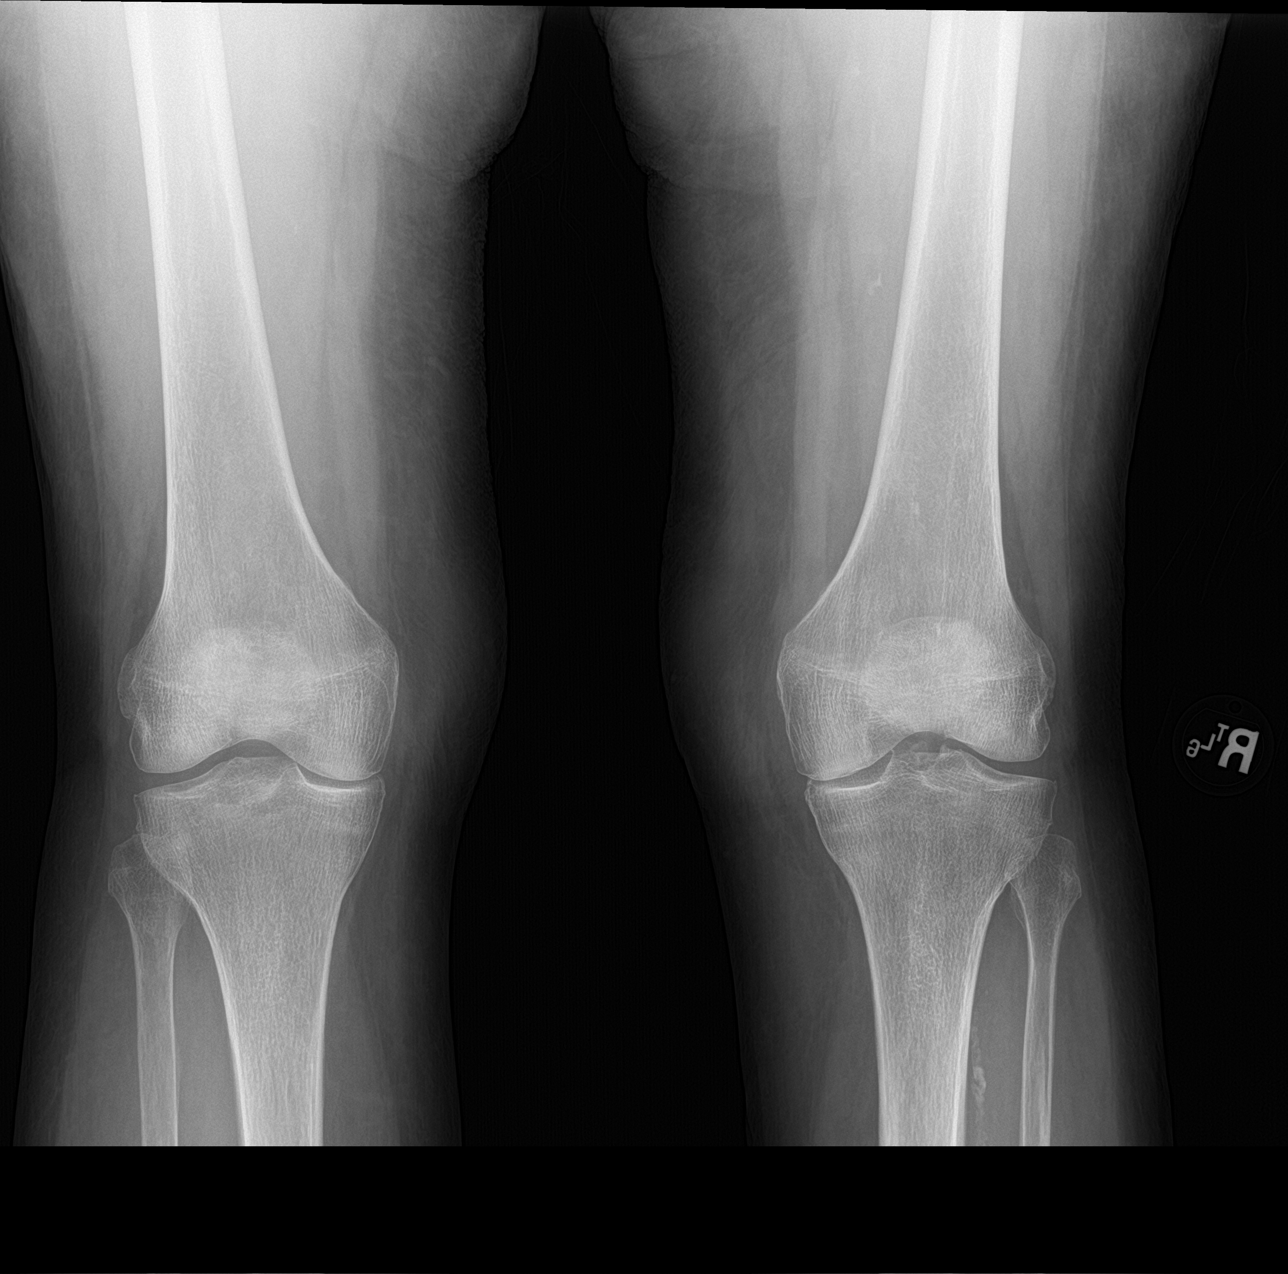

[knee lat]
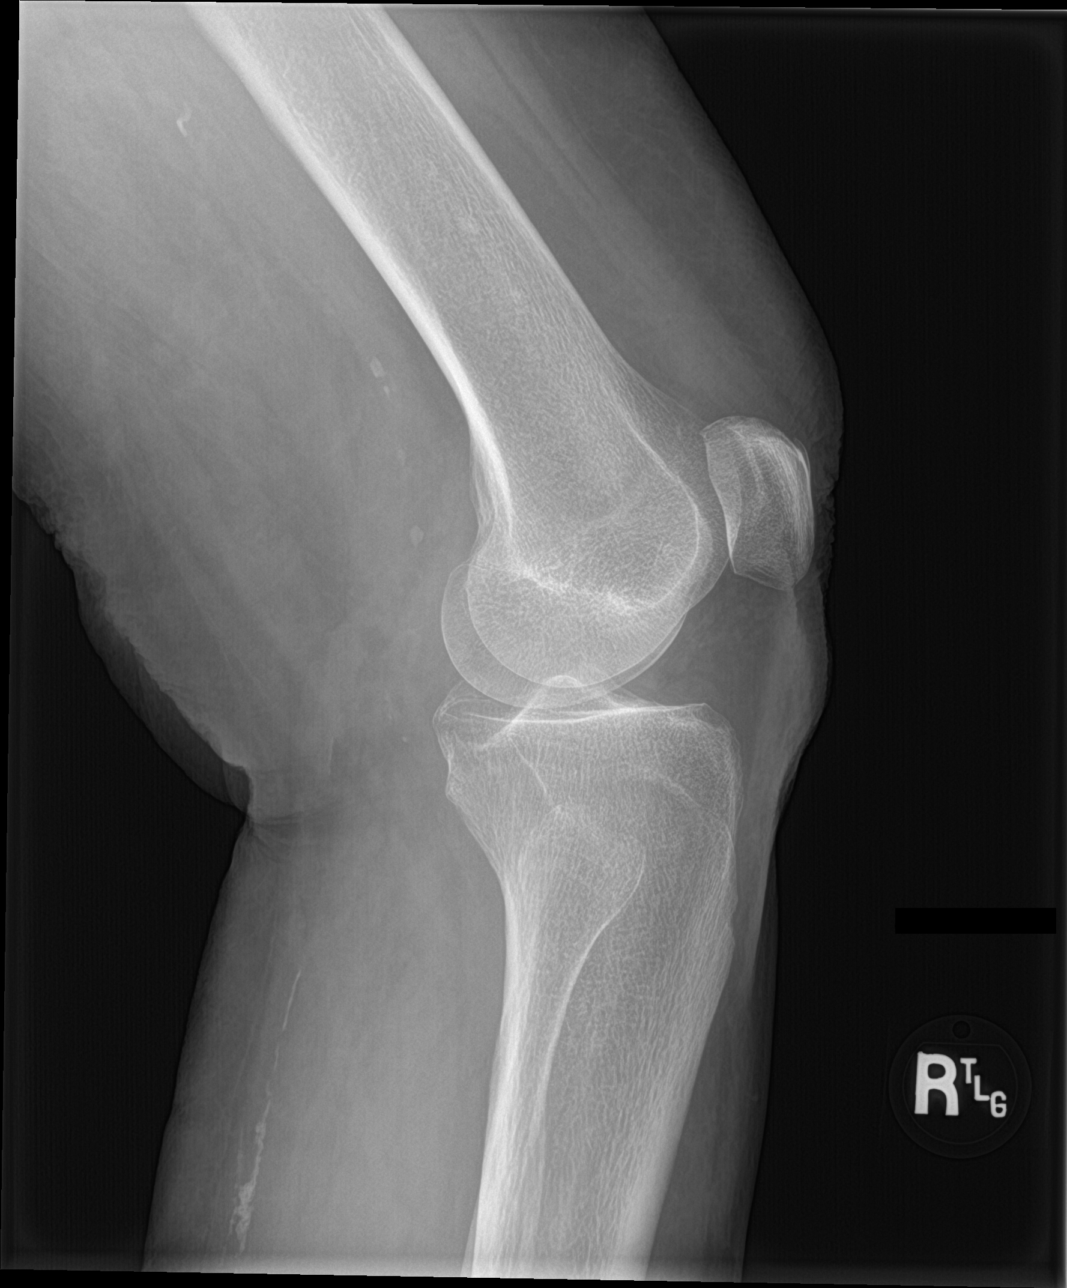

[knee sunrise]
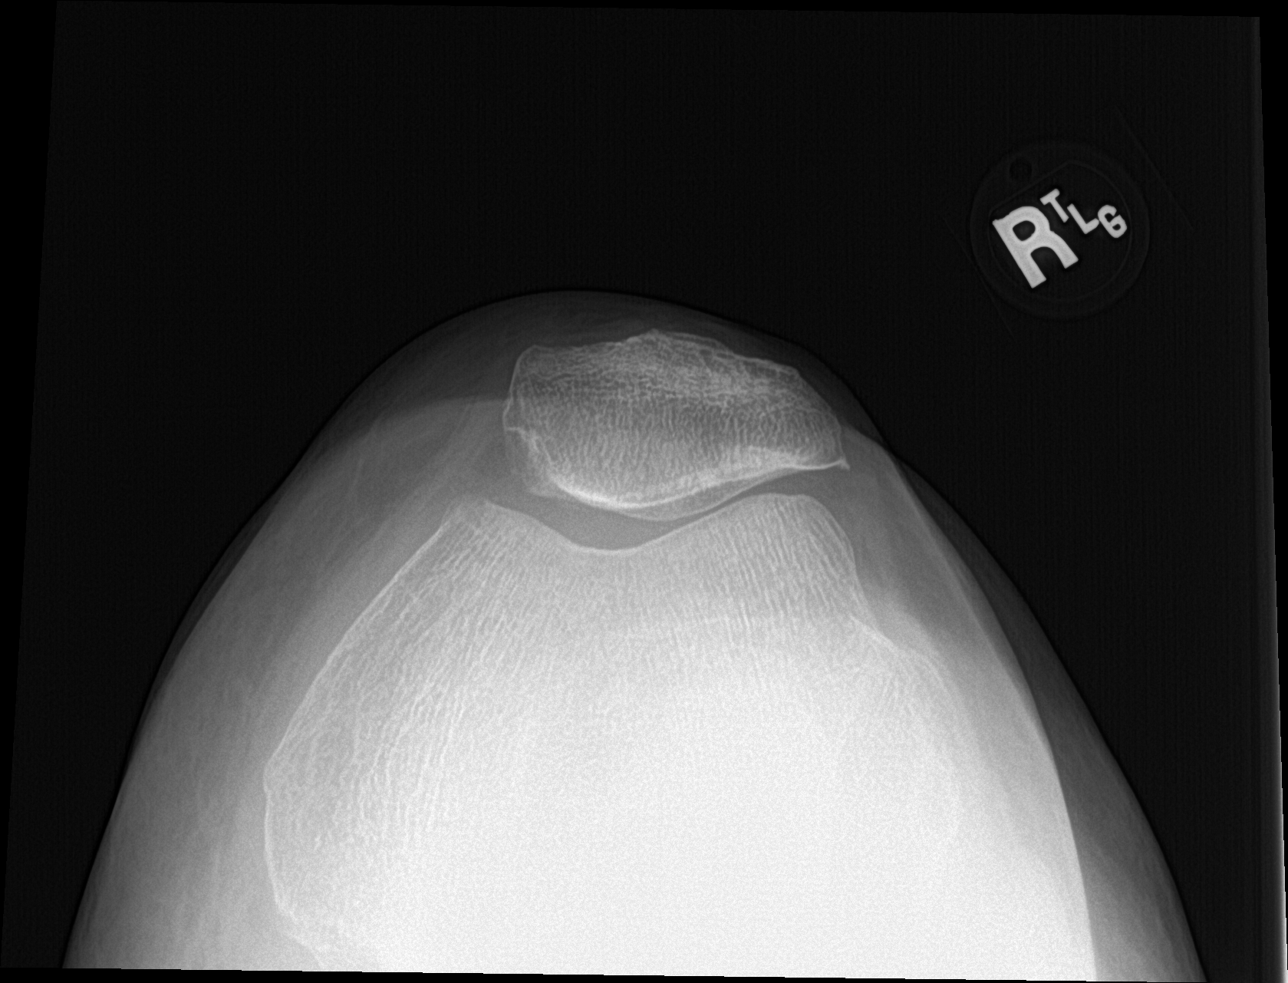

[3 of 3 positions shown; findings below may reference images not displayed]

FINDINGS: The bones are adequately mineralized. There is narrowing of the
medial joint compartments bilaterally greatest on the left. The
lateral compartments are preserved. There is mild narrowing of the
lateral aspect of the right patellofemoral compartment. There is
beaking of the tibial spines on the right. There is a small spur
arising from the superior and lateral articular margins of the
patella. There is no joint effusion. There is no acute fracture nor
dislocation.
IMPRESSION: There are degenerative changes of both knees centered on the medial
joint compartments. In addition on the right there is spurring of
the tibial spines and the articular margins of the patella with mild
narrowing of the lateral aspect of the patellofemoral joint.

## 2017-08-21 DIAGNOSIS — H353132 Nonexudative age-related macular degeneration, bilateral, intermediate dry stage: Secondary | ICD-10-CM | POA: Diagnosis not present

## 2017-08-31 ENCOUNTER — Other Ambulatory Visit: Payer: Self-pay | Admitting: Ophthalmology

## 2017-08-31 DIAGNOSIS — H029 Unspecified disorder of eyelid: Secondary | ICD-10-CM | POA: Diagnosis not present

## 2017-08-31 DIAGNOSIS — C441192 Basal cell carcinoma of skin of left lower eyelid, including canthus: Secondary | ICD-10-CM | POA: Diagnosis not present

## 2017-08-31 DIAGNOSIS — D487 Neoplasm of uncertain behavior of other specified sites: Secondary | ICD-10-CM | POA: Diagnosis not present

## 2017-09-06 ENCOUNTER — Other Ambulatory Visit: Payer: Self-pay | Admitting: Family Medicine

## 2017-09-07 DIAGNOSIS — C441191 Basal cell carcinoma of skin of left upper eyelid, including canthus: Secondary | ICD-10-CM | POA: Diagnosis not present

## 2017-09-07 DIAGNOSIS — Z85828 Personal history of other malignant neoplasm of skin: Secondary | ICD-10-CM | POA: Diagnosis not present

## 2017-09-20 DIAGNOSIS — H353132 Nonexudative age-related macular degeneration, bilateral, intermediate dry stage: Secondary | ICD-10-CM | POA: Diagnosis not present

## 2017-10-20 DIAGNOSIS — H353132 Nonexudative age-related macular degeneration, bilateral, intermediate dry stage: Secondary | ICD-10-CM | POA: Diagnosis not present

## 2017-10-31 DIAGNOSIS — C441192 Basal cell carcinoma of skin of left lower eyelid, including canthus: Secondary | ICD-10-CM | POA: Diagnosis not present

## 2017-11-19 DIAGNOSIS — H353132 Nonexudative age-related macular degeneration, bilateral, intermediate dry stage: Secondary | ICD-10-CM | POA: Diagnosis not present

## 2017-11-28 ENCOUNTER — Ambulatory Visit: Payer: Medicare Other | Admitting: Family Medicine

## 2017-12-07 ENCOUNTER — Encounter: Payer: Self-pay | Admitting: Family Medicine

## 2017-12-07 ENCOUNTER — Ambulatory Visit (INDEPENDENT_AMBULATORY_CARE_PROVIDER_SITE_OTHER): Payer: Medicare Other | Admitting: Family Medicine

## 2017-12-07 VITALS — BP 131/67 | HR 58 | Ht 64.57 in | Wt 184.0 lb

## 2017-12-07 DIAGNOSIS — I10 Essential (primary) hypertension: Secondary | ICD-10-CM | POA: Diagnosis not present

## 2017-12-07 DIAGNOSIS — I7143 Infrarenal abdominal aortic aneurysm, without rupture: Secondary | ICD-10-CM

## 2017-12-07 DIAGNOSIS — I714 Abdominal aortic aneurysm, without rupture: Secondary | ICD-10-CM | POA: Diagnosis not present

## 2017-12-07 DIAGNOSIS — E785 Hyperlipidemia, unspecified: Secondary | ICD-10-CM

## 2017-12-07 DIAGNOSIS — K219 Gastro-esophageal reflux disease without esophagitis: Secondary | ICD-10-CM

## 2017-12-07 NOTE — Patient Instructions (Signed)
Check with insurance to see if they will cover Livalo ( statin ) to try for a month.

## 2017-12-07 NOTE — Progress Notes (Signed)
Subjective:    CC: BP, GERD  HPI:  Hypertension- Pt denies chest pain, SOB, dizziness, or heart palpitations.  Taking meds as directed w/o problems.  Denies medication side effects.    GERD- she is taking Zantac and doing well  Hyperlipidemia - she feels like her statin is affecting her memory.    Past medical history, Surgical history, Family history not pertinant except as noted below, Social history, Allergies, and medications have been entered into the medical record, reviewed, and corrections made.   Review of Systems: No fevers, chills, night sweats, weight loss, chest pain, or shortness of breath.   Objective:    General: Well Developed, well nourished, and in no acute distress.  Neuro: Alert and oriented x3, extra-ocular muscles intact, sensation grossly intact.  HEENT: Normocephalic, atraumatic  Skin: Warm and dry, no rashes. Cardiac: Regular rate and rhythm, no murmurs rubs or gallops, no lower extremity edema.  Respiratory: Clear to auscultation bilaterally. Not using accessory muscles, speaking in full sentences.   Impression and Recommendations:    HTN - Well controlled. Continue current regimen. Follow up in  6 mo. Due for labs.   GERD - Stable.    Hyperlipidemia - we discussed could try Livalo for 30 day if she would like. She will check with her insurance first.  Due to recheck lipids.   Aneurysm, abd aorta - on statin and plavix.  Due for recheck in 2 yrs.

## 2017-12-11 ENCOUNTER — Other Ambulatory Visit: Payer: Self-pay | Admitting: *Deleted

## 2017-12-11 MED ORDER — FLUTICASONE PROPIONATE 50 MCG/ACT NA SUSP: NASAL | 4 refills | Status: DC

## 2017-12-19 DIAGNOSIS — H353132 Nonexudative age-related macular degeneration, bilateral, intermediate dry stage: Secondary | ICD-10-CM | POA: Diagnosis not present

## 2017-12-21 DIAGNOSIS — I10 Essential (primary) hypertension: Secondary | ICD-10-CM | POA: Diagnosis not present

## 2017-12-21 DIAGNOSIS — E785 Hyperlipidemia, unspecified: Secondary | ICD-10-CM | POA: Diagnosis not present

## 2017-12-21 LAB — COMPLETE METABOLIC PANEL WITHOUT GFR
AG Ratio: 1.5 (calc) (ref 1.0–2.5)
ALT: 8 U/L (ref 6–29)
AST: 16 U/L (ref 10–35)
Albumin: 3.7 g/dL (ref 3.6–5.1)
Alkaline phosphatase (APISO): 79 U/L (ref 33–130)
BUN/Creatinine Ratio: 7 (calc) (ref 6–22)
BUN: 7 mg/dL (ref 7–25)
CO2: 30 mmol/L (ref 20–32)
Calcium: 9.3 mg/dL (ref 8.6–10.4)
Chloride: 110 mmol/L (ref 98–110)
Creat: 0.95 mg/dL — ABNORMAL HIGH (ref 0.60–0.88)
GFR, Est African American: 64 mL/min/1.73m2
GFR, Est Non African American: 55 mL/min/1.73m2 — ABNORMAL LOW
Globulin: 2.5 g/dL (ref 1.9–3.7)
Glucose, Bld: 94 mg/dL (ref 65–99)
Potassium: 4.2 mmol/L (ref 3.5–5.3)
Sodium: 145 mmol/L (ref 135–146)
Total Bilirubin: 0.6 mg/dL (ref 0.2–1.2)
Total Protein: 6.2 g/dL (ref 6.1–8.1)

## 2017-12-21 LAB — LIPID PANEL
Cholesterol: 141 mg/dL (ref <200)
HDL: 49 mg/dL — ABNORMAL LOW (ref >50)
LDL Cholesterol (Calc): 73 mg/dL (calc)
NON-HDL CHOLESTEROL (CALC): 92 mg/dL (ref <130)
Total CHOL/HDL Ratio: 2.9 (calc) (ref <5.0)
Triglycerides: 111 mg/dL (ref <150)

## 2017-12-26 DIAGNOSIS — H16223 Keratoconjunctivitis sicca, not specified as Sjogren's, bilateral: Secondary | ICD-10-CM | POA: Diagnosis not present

## 2017-12-26 DIAGNOSIS — H40013 Open angle with borderline findings, low risk, bilateral: Secondary | ICD-10-CM | POA: Diagnosis not present

## 2017-12-26 DIAGNOSIS — H04123 Dry eye syndrome of bilateral lacrimal glands: Secondary | ICD-10-CM | POA: Diagnosis not present

## 2017-12-26 DIAGNOSIS — L718 Other rosacea: Secondary | ICD-10-CM | POA: Diagnosis not present

## 2018-01-18 DIAGNOSIS — H353132 Nonexudative age-related macular degeneration, bilateral, intermediate dry stage: Secondary | ICD-10-CM | POA: Diagnosis not present

## 2018-01-31 ENCOUNTER — Encounter: Payer: Self-pay | Admitting: Family Medicine

## 2018-02-17 DIAGNOSIS — H353132 Nonexudative age-related macular degeneration, bilateral, intermediate dry stage: Secondary | ICD-10-CM | POA: Diagnosis not present

## 2018-03-06 DIAGNOSIS — Z853 Personal history of malignant neoplasm of breast: Secondary | ICD-10-CM | POA: Diagnosis not present

## 2018-03-06 DIAGNOSIS — N6310 Unspecified lump in the right breast, unspecified quadrant: Secondary | ICD-10-CM | POA: Diagnosis not present

## 2018-03-06 DIAGNOSIS — R921 Mammographic calcification found on diagnostic imaging of breast: Secondary | ICD-10-CM | POA: Diagnosis not present

## 2018-03-06 LAB — HM MAMMOGRAPHY

## 2018-03-07 ENCOUNTER — Telehealth: Payer: Self-pay

## 2018-03-07 NOTE — Telephone Encounter (Signed)
The short of it.  Michelle [name] wants patient to have a breast biopsy today and patient would rather wait so she can be off of the Plavix for 5 days.      The long story. Michelle [name] called and states her mom has to have a breast biopsy. The last one she had she was on Plavix and got a huge hematoma. She was advised the next time she had a biopsy to come off Plavix for 5 days prior to biopsy. When Michelle [name] called to schedule the biopsy she declined an appointment for today because she was still on Plavix. The person that was scheduling her said it was urgent she have the biopsy and would she rather have a stroke or a bruise. Michelle [name] is very upset. She wants to wait the 5 days.

## 2018-03-07 NOTE — Telephone Encounter (Signed)
We typically hold for for 5 days but it actually has a short half life. Holding for 3 days should be adequate.

## 2018-03-07 NOTE — Telephone Encounter (Signed)
See first note.  [facility] called and wanted to know if Cigi needed to stay off Plavix before having a biopsy and if so how long.    Janett Billow [phone]

## 2018-03-08 NOTE — Telephone Encounter (Signed)
Advised patient's husband of recommendations.  Called and left a message with Janett Billow at [facility] advising of recommendations.

## 2018-03-14 ENCOUNTER — Other Ambulatory Visit: Payer: Self-pay | Admitting: Radiology

## 2018-03-14 ENCOUNTER — Other Ambulatory Visit: Payer: Self-pay | Admitting: Family Medicine

## 2018-03-14 DIAGNOSIS — N6311 Unspecified lump in the right breast, upper outer quadrant: Secondary | ICD-10-CM | POA: Diagnosis not present

## 2018-03-14 DIAGNOSIS — C50411 Malignant neoplasm of upper-outer quadrant of right female breast: Secondary | ICD-10-CM | POA: Diagnosis not present

## 2018-03-19 DIAGNOSIS — H353132 Nonexudative age-related macular degeneration, bilateral, intermediate dry stage: Secondary | ICD-10-CM | POA: Diagnosis not present

## 2018-03-22 ENCOUNTER — Encounter: Payer: Self-pay | Admitting: Family Medicine

## 2018-03-27 ENCOUNTER — Encounter: Payer: Self-pay | Admitting: *Deleted

## 2018-03-27 ENCOUNTER — Encounter: Payer: Self-pay | Admitting: Oncology

## 2018-03-27 DIAGNOSIS — C50911 Malignant neoplasm of unspecified site of right female breast: Secondary | ICD-10-CM | POA: Diagnosis not present

## 2018-03-27 NOTE — Progress Notes (Signed)
[facility]  [phone] [phone]) [phone]     ID: BARBARANN KELLY DOB: May 31, 1932  MR#: [id number]  WPY#:[id number]  Patient Care Team: Hali Marry, MD as PCP - General (Family Medicine) Hali Marry, MD (Family Medicine) Rolm Bookbinder, MD as Attending Physician (Dermatology) Monna Fam, MD as Attending Physician (Ophthalmology) Bandy Honaker, [name], MD as Consulting Physician (Oncology) Rolm Bookbinder, MD as Consulting Physician (General Surgery) Kyung Rudd, MD as Consulting Physician (Radiation Oncology) Chauncey Cruel, MD OTHER MD:  CHIEF COMPLAINT: Recurrent estrogen receptor positive breast cancer  CURRENT TREATMENT: Awaiting definitive surgery   HISTORY OF CURRENT ILLNESS: CELESTINE PRIM has a history of right breast invasive ductal carcinoma status-post right lumpectomy with sentinel lymph node biopsy 03/14/2011 ([id number]), for a 0.52 cm, grade 1 invasive ductal carcinoma, both sentinel lymph nodes clear; margins negative; estrogen receptor 100% positive, progesterone receptor 100% positive, both with strong staining intensity, HER2 negative, with an MIB-1 of 10%. She underwent MammoSite therapy between 03/18/2011 and 03/25/2011.  Antiestrogen therapy was discussed with the patient by my former partner Dr. Chancy Milroy on 05/19/2011, with the patient declining.  Genetics testing for the BRCA 1 and 2 gene mutations was obtained 02/28/2011 and was negative.  She had routine screening mammography on 03/06/2018 showing a possible abnormality in the right breast. She proceeded to bilateral diagnostic mammography with tomography and right breast ultrasonography at [facility] on the same day showing: a 1.7 irregular, hypoechoic mass at the 11-12 o'clock areolar margin; one normal lymph node was seen.  Accordingly on 03/14/2018 she proceeded to biopsy of the right breast area in question. The pathology ([id number]) from this procedure showed:  invasive ductal carcinoma, grade 2. Prognostic indicators significant for: estrogen receptor, 100% positive and progesterone receptor, 100% positive, both with strong staining intensity. Proliferation marker Ki67 at 10%. HER2 negative (1+) by immunohistochemistry.  Of note, she also has a personal history of uterine cancer status post total abdominal hysterectomy and bilateral salpingo-oophorectomy 06/11/2013 for an invasive endometrioid carcinoma of the uterus, grade 1, confined within the inner half of the endometrium.  A total of 3 left pelvic and 10 right pelvic lymph nodes were all negative for a pT1a pN0 stage.   She has also had multiple abscesses for skin cancer (basal cell and squamous cell).  The patient's subsequent history is as detailed below.   INTERVAL HISTORY: Mirielle was evaluated in the breast cancer clinic on 03/28/2018 accompanied by her husband Mikki Santee and daughter [name]. She met with Dr. Donne Hazel yesterday, 03/27/2018, and she is scheduled to meet with Dr. Lisbeth Renshaw on 03/29/2018. Per patient, Dr. Donne Hazel plans for her to undergo lumpectomy.   REVIEW OF SYSTEMS: TYSHAE STAIR reports doing well overall. She enjoys housecleaning, raking, and gardening, but she doesn't do these things much anymore. There were no specific symptoms leading to the original mammogram, which was routinely scheduled. The patient denies unusual headaches, visual changes, nausea, vomiting, stiff neck, dizziness, or gait imbalance. There has been no cough, phlegm production, or pleurisy, no chest pain or pressure, and no change in bowel or bladder habits. The patient denies fever, rash, bleeding, unexplained fatigue or unexplained weight loss. A detailed review of systems was otherwise entirely negative.   PAST MEDICAL HISTORY: Past Medical History:  Diagnosis Date  . AAA (abdominal aortic aneurysm) ([place]) 10/2013  . Basal cell carcinoma 2011   R back  . Bradycardia   . Breast cancer ([place]) 02/2011   US-guided  biopsy  . Cataract   .  Endometrial cancer ([place]) 05/2013   uterine ca  . GERD (gastroesophageal reflux disease)   . Hiatal hernia 2008  . History of breast cancer   . History of cerebrovascular accident 2009  . Hyperlipidemia   . Hypertension 2012  . Macular degeneration   . Polyp of colon   . Shingles 2008  . Squamous cell carcinoma    2010-2015  . Squamous cell skin cancer, multiple sites 2014   recurrent  . Stroke ([place]) 09/2007   Slight memory problems residual  . Tendonitis    rt hand  . Vocal cord polyp     PAST SURGICAL HISTORY: Past Surgical History:  Procedure Laterality Date  . APPENDECTOMY    . BASAL CELL CARCINOMA EXCISION    . biospy  10/28/09   Shave Biospy skin Left hand(Acitinic Keratoses), Right Upper Arm (superficial Basal Cell), Right Upper Back(Superficial Basal Cell), Left Neck ( Solar Lentigo & Seborrheic Keratoses)  . BREAST BIOPSY  02/11/11   Right Breast Needle Core Biospy - Upper Outer Quadrant; ER/PR 100%, Her-2 Neu neg.; Ki-67 10%  . BREAST MAMMOSITE  03/14/2011   Procedure: MAMMOSITE BREAST;  Surgeon: Rolm Bookbinder, MD;  Location: [facility];  Service: General;  Laterality: Right;  . BREAST SURGERY     right breast lumpectomy snbx  . CHOLECYSTECTOMY  2004  . COLONOSCOPY    . COLONOSCOPY W/ POLYPECTOMY     2008, 2014 (benign)  . COLONOSCOPY WITH PROPOFOL N/A 07/09/2015   Procedure: COLONOSCOPY WITH PROPOFOL;  Surgeon: Wonda Horner, MD;  Location: [facility]COPY;  Service: Endoscopy;  Laterality: N/A;  . DILATION AND CURETTAGE OF UTERUS N/A 04/24/2013   Procedure: DILATATION AND CURETTAGE;  Surgeon: Guss Bunde, MD;  Location: [facility];  Service: Gynecology;  Laterality: N/A;  . ESOPHAGOGASTRODUODENOSCOPY N/A 07/06/2015   Procedure: ESOPHAGOGASTRODUODENOSCOPY (EGD);  Surgeon: Wilford Corner, MD;  Location: [facility]COPY;  Service: Endoscopy;  Laterality: N/A;  . HYSTEROSCOPY N/A 04/24/2013   Procedure: HYSTEROSCOPY;  Surgeon: Guss Bunde, MD;  Location: [facility];  Service: Gynecology;  Laterality: N/A;  . ROBOTIC ASSISTED TOTAL HYSTERECTOMY WITH BILATERAL SALPINGO OOPHERECTOMY Bilateral 06/11/2013   Procedure: ROBOTIC ASSISTED TOTAL HYSTERECTOMY WITH BILATERAL SALPINGO OOPHORECTOMY WITH POSSIBLE STAGING, EPISIOTOMY;  Surgeon: Janie Morning, MD;  Location: [facility];  Service: Gynecology;  Laterality: Bilateral;  . SQUAMOUS CELL CARCINOMA EXCISION    . TEAR DUCT PROBING    . TONSILLECTOMY    . vocal cord poylps  1969   excision    FAMILY HISTORY Family History  Problem Relation Age of Onset  . Heart disease Father   . Cancer Sister 54       Lymphoma  . Breast cancer Sister   . Cancer Paternal Aunt        Breast cancer  . Cancer Daughter 37       Bilateral Breast Cancer  . Breast cancer Daughter 29  . Cancer Daughter        Breast Cancer  . Breast cancer Daughter   . Cancer Sister        2005 - Renal Cell Cancer and Breast Cancer  . Breast cancer Sister    Patient father was 65 years old when he died from heart issues. Patient mother died from heart issues at age 76. Patient has 3 siblings, 1 brother and 2 sisters.  Patient notes a family hx of breast cancer in both sisters and both daughters. Patient's daughters have had extensive genetics evaluation with  no deleterious mutation identified.   GYNECOLOGIC HISTORY:  No LMP recorded. Patient has had a hysterectomy. Menarche: 85 years old Age at first live birth: 85 years old [place] P 2 LMP 63, roughly 85 years old Contraceptive unknown HRT? No Hysterectomy? Yes, 06/11/2013 BSO? 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 She is married. Husband Mortimer Fries is a retired Airline pilot. They have 2 daughters, [name] and [place]. The patient attends [employer]. She and her husband share 2 grandchildren and 4 great-grandchildren.     ADVANCED DIRECTIVES: Husband Mikki Santee is her HCPOA.   HEALTH  MAINTENANCE: Social History   Tobacco Use  . Smoking status: Former Smoker    Packs/day: 1.00    Years: 30.00    Pack years: 30.00    Last attempt to quit: 02/01/1996    Years since quitting: 22.1  . Smokeless tobacco: Never Used  Substance Use Topics  . Alcohol use: Yes    Alcohol/week: 1.0 standard drinks    Types: 1 Glasses of wine per week    Comment: Ocassionaly once a year .  Marland Kitchen Drug use: No     Colonoscopy: 07/09/2015, negative, Dr. Penelope Coop  PAP: 09/29/2014, negative  Bone density: 11/01/2016, T-score of -1.5   Allergies  Allergen Reactions  . Benadryl [Diphenhydramine Hcl]     Hyper  . Doxycycline Other (See Comments)    acid reflux  . Simvastatin Other (See Comments)    REACTION: memory loss  . Fosamax [Alendronate Sodium] Other (See Comments)    GERD  . Lisinopril Other (See Comments)    Dry cough  . Nsaids Other (See Comments)    GI Bleed    Current Outpatient Medications  Medication Sig Dispense Refill  . acetaminophen (TYLENOL) 325 MG tablet Take 650 mg by mouth as needed for mild pain.     Marland Kitchen atorvastatin (LIPITOR) 20 MG tablet TAKE 1 TABLET BY MOUTH  EVERY OTHER NIGHT. 45 tablet 3  . clopidogrel (PLAVIX) 75 MG tablet TAKE 1 TABLET BY MOUTH  DAILY 90 tablet 1  . fluticasone (FLONASE) 50 MCG/ACT nasal spray INSTILL 2 SPRAYS IN EACH  NOSTRIL EVERY DAY 48 g 4  . losartan (COZAAR) 25 MG tablet TAKE 1 TABLET BY MOUTH  DAILY 90 tablet 1  . Multiple Vitamins-Minerals (ICAPS AREDS 2 PO) Take by mouth.    . Propylene Glycol (SYSTANE COMPLETE OP) Apply to eye.    . ranitidine (ZANTAC) 150 MG tablet Take 1 tablet (150 mg total) by mouth 2 (two) times daily. 180 tablet 3  . VIACTIV 500-500-40 MG-UNT-MCG CHEW CHEW 1 TABLET BY MOUTH EVERY MORNING.  12  . anastrozole (ARIMIDEX) 1 MG tablet Take 1 tablet (1 mg total) by mouth daily. 90 tablet 4   No current facility-administered medications for this visit.     OBJECTIVE: Older white woman in no acute distress  Vitals:    03/28/18 1607  BP: (!) 144/59  Pulse: (!) 58  Resp: 18  Temp: 98 F (36.7 C)  SpO2: 97%     Body mass index is 30.91 kg/m.   Wt Readings from Last 3 Encounters:  03/28/18 183 lb 4.8 oz (83.1 kg)  12/07/17 184 lb (83.5 kg)  05/25/17 184 lb (83.5 kg)      ECOG FS:1 - Symptomatic but completely ambulatory  Ocular: Sclerae unicteric, pupils round and equal Lymphatic: No cervical or supraclavicular adenopathy Lungs no rales or  rhonchi Heart regular rate and rhythm Abd soft, nontender, positive bowel sounds MSK no focal spinal tenderness, no joint edema Neuro: non-focal, well-oriented, appropriate affect Breasts: Right breast is status post remote lumpectomy, MammoSite radiation, and recent biopsy.  There is moderate ecchymosis.  There is an irregular scar from the prior surgery.  The left breast is unremarkable.  Both axillae are benign.  LAB RESULTS:  CMP     Component Value Date/Time   NA 141 03/28/2018 1555   K 4.3 03/28/2018 1555   CL 106 03/28/2018 1555   CO2 28 03/28/2018 1555   GLUCOSE 106 (H) 03/28/2018 1555   BUN 10 03/28/2018 1555   BUN 12.3 11/01/2013 1031   CREATININE 1.01 (H) 03/28/2018 1555   CREATININE 0.95 (H) 12/21/2017 1026   CREATININE 0.9 11/01/2013 1031   CALCIUM 9.6 03/28/2018 1555   PROT 6.9 03/28/2018 1555   ALBUMIN 3.8 03/28/2018 1555   AST 18 03/28/2018 1555   ALT 10 03/28/2018 1555   ALKPHOS 83 03/28/2018 1555   BILITOT 0.4 03/28/2018 1555   GFRNONAA 51 (L) 03/28/2018 1555   GFRNONAA 55 (L) 12/21/2017 1026   GFRAA 60 (L) 03/28/2018 1555   GFRAA 64 12/21/2017 1026    No results found for: TOTALPROTELP, ALBUMINELP, A1GS, A2GS, BETS, BETA2SER, GAMS, MSPIKE, SPEI  No results found for: KPAFRELGTCHN, LAMBDASER, KAPLAMBRATIO  Lab Results  Component Value Date   WBC 7.6 03/28/2018   NEUTROABS 5.0 03/28/2018   HGB 12.0 03/28/2018   HCT 37.9 03/28/2018   MCV 101.1 (H) 03/28/2018   PLT 228 03/28/2018    @LASTCHEMISTRY @  Lab Results    Component Value Date   LABCA2 77 (H) 03/08/2011    No components found for: [id number]  No results for input(s): INR in the last 168 hours.  Lab Results  Component Value Date   LABCA2 77 (H) 03/08/2011    No results found for: TFT732  No results found for: KGU542  No results found for: HCW237  No results found for: CA2729  No components found for: HGQUANT  No results found for: CEA1 / No results found for: CEA1   No results found for: AFPTUMOR  No results found for: [place]  No results found for: PSA1  Appointment on 03/28/2018  Component Date Value Ref Range Status  . Sodium 03/28/2018 141  135 - 145 mmol/L Final  . Potassium 03/28/2018 4.3  3.5 - 5.1 mmol/L Final  . Chloride 03/28/2018 106  98 - 111 mmol/L Final  . CO2 03/28/2018 28  22 - 32 mmol/L Final  . Glucose, Bld 03/28/2018 106* 70 - 99 mg/dL Final  . BUN 03/28/2018 10  8 - 23 mg/dL Final  . Creatinine 03/28/2018 1.01* 0.44 - 1.00 mg/dL Final  . Calcium 03/28/2018 9.6  8.9 - 10.3 mg/dL Final  . Total Protein 03/28/2018 6.9  6.5 - 8.1 g/dL Final  . Albumin 03/28/2018 3.8  3.5 - 5.0 g/dL Final  . AST 03/28/2018 18  15 - 41 U/L Final  . ALT 03/28/2018 10  0 - 44 U/L Final  . Alkaline Phosphatase 03/28/2018 83  38 - 126 U/L Final  . Total Bilirubin 03/28/2018 0.4  0.3 - 1.2 mg/dL Final  . GFR, Est Non Af Am 03/28/2018 51* >60 mL/min Final  . GFR, Est AFR Am 03/28/2018 60* >60 mL/min Final  . Anion gap 03/28/2018 7  5 - 15 Final   Performed at [facility], [address] [place], [place]  [address]  . WBC Count 03/28/2018 7.6  4.0 - 10.5 K/uL Final  . RBC 03/28/2018 3.75* 3.87 - 5.11 MIL/uL Final  . Hemoglobin 03/28/2018 12.0  12.0 - 15.0 g/dL Final  . HCT 03/28/2018 37.9  36.0 - 46.0 % Final  . MCV 03/28/2018 101.1* 80.0 - 100.0 fL Final  . MCH 03/28/2018 32.0  26.0 - 34.0 pg Final  . MCHC 03/28/2018 31.7  30.0 - 36.0 g/dL Final  . RDW 03/28/2018 12.2  11.5 - 15.5 % Final   . Platelet Count 03/28/2018 228  150 - 400 K/uL Final  . nRBC 03/28/2018 0.0  0.0 - 0.2 % Final  . Neutrophils Relative % 03/28/2018 65  % Final  . Neutro Abs 03/28/2018 5.0  1.7 - 7.7 K/uL Final  . Lymphocytes Relative 03/28/2018 22  % Final  . Lymphs Abs 03/28/2018 1.7  0.7 - 4.0 K/uL Final  . Monocytes Relative 03/28/2018 10  % Final  . Monocytes Absolute 03/28/2018 0.7  0.1 - 1.0 K/uL Final  . Eosinophils Relative 03/28/2018 2  % Final  . Eosinophils Absolute 03/28/2018 0.1  0.0 - 0.5 K/uL Final  . Basophils Relative 03/28/2018 1  % Final  . Basophils Absolute 03/28/2018 0.1  0.0 - 0.1 K/uL Final  . Immature Granulocytes 03/28/2018 0  % Final  . Abs Immature Granulocytes 03/28/2018 0.02  0.00 - 0.07 K/uL Final   Performed at [facility], [place] [address], [place], [place] [address]    (this displays the last labs from the last 3 days)  No results found for: TOTALPROTELP, ALBUMINELP, A1GS, A2GS, BETS, BETA2SER, GAMS, MSPIKE, SPEI (this displays SPEP labs)  No results found for: KPAFRELGTCHN, LAMBDASER, KAPLAMBRATIO (kappa/lambda light chains)  No results found for: HGBA, HGBA2QUANT, HGBFQUANT, HGBSQUAN (Hemoglobinopathy evaluation)   Lab Results  Component Value Date   LDH 131 07/04/2015    Lab Results  Component Value Date   IRON 179 (H) 07/04/2015   TIBC 249 (L) 07/04/2015   IRONPCTSAT 72 (H) 07/04/2015   (Iron and TIBC)  Lab Results  Component Value Date   FERRITIN 56 11/10/2015    Urinalysis    Component Value Date/Time   COLORURINE YELLOW 10/21/2013 [place] 10/21/2013 1645   LABSPEC 1.013 10/21/2013 1645   PHURINE 5.5 10/21/2013 1645   GLUCOSEU NEG 10/21/2013 1645   HGBUR NEG 10/21/2013 1645   BILIRUBINUR NEG 10/21/2013 1645   KETONESUR NEG 10/21/2013 1645   PROTEINUR NEG 10/21/2013 1645   UROBILINOGEN 0.2 10/21/2013 1645   NITRITE NEG 10/21/2013 1645   LEUKOCYTESUR TRACE (A) 10/21/2013 1645      STUDIES: Pathology results discussed with the patient  ELIGIBLE FOR AVAILABLE RESEARCH PROTOCOL: no   ASSESSMENT: 85 y.o. [place] woman  (1) status post right upper outer quadrant lumpectomy 03/14/2011 for a pT1b (0.52 cm), pN0 invasive ductal carcinoma, grade 1, estrogen and progesterone receptor positive, HER-2 nonamplified, with an MIB-1 of 10%; a total of 2 axillary lymph nodes were removed.  (a) status post MammoSite radiation between 03/17/2018 and 03/24/2018  (b) opted against adjuvant antiestrogens  (2) status post total abdominal hysterectomy and bilateral salpingo-oophorectomy 06/11/2013 for a pT1a pN0 invasive endometrioid carcinoma, FIGO grade 1, a total of 3 left and 10 right pelvic lymph nodes all clear  (3) status post right breast upper outer quadrant biopsy 03/14/2022 a clinical T1c N0 invasive ductal carcinoma, grade 2, strongly estrogen and progesterone receptor positive, HER-2 nonamplified, with an MIB-1 of 10%.  (  4) definitive surgery pending  (5) anastrozole started 04/01/2018  (6) consider adjuvant radiation  PLAN: I spent approximately 60 minutes face to face with Letta Median with more than 50% of that time spent in counseling and coordination of care. Specifically we reviewed the biology of the patient's diagnosis and the specifics of her situation.  She understands this cancer is very similar to the prior one phenotypically and is in the same location.  Most likely this is a local recurrence of the tumor.  While her first cancer was essentially a T1a, this one is T1c.  For T1a tumors the chance of outside the breast spread is so small that systemic therapy is optional.  For T1c, while the risk is low, nevertheless the possibility of spread outside the breast is sufficiently a concern that systemic therapy is recommended.  In her situation this means antiestrogens.  We discussed the possible toxicity side effects and complications of anastrozole and emphasized  that this medication does not affect clotting one way or another. I have gone ahead and placed the prescription in for her.  She will be starting it on 04/01/2018.  The main concern with this medication is worsening osteopenia or osteoporosis.  She has not had a bone density in some time and I am requesting that test to be done at [facility] sometime in a few weeks so that we can take any measures necessary to prevent for example a hip fracture in the future.  I have discussed the possibility of additional radiation in a patient who had MammoSite radiation previously with Dr. Lisbeth Renshaw.  He is very doubtful this would be of benefit to the patient but tells me he is going to be looking into it and Michele has an appointment with him tomorrow.  The main point of starting anastrozole now is that 2 months from now, just a few weeks after her surgery, the patient will already have been on anastrozole long enough to know if she is going to be able to tolerate it.  If she does not tolerated then radiation becomes more of a possibility.  Talisha tells me she is very motivated to take her medication.  She would agree to radiation as well if that was felt to be necessary.  She understands she is also a candidate for additional genetics testing even though her daughters have been tested extensively and found to be negative.  We can discuss that further at the next visit.  Saryna has a good understanding of the overall plan. She agrees with it. She knows the goal of treatment in her case is cure. She will call with any problems that may develop before her next visit here.  Chauncey Cruel, MD   03/28/2018 5:28 PM Medical Oncology and Hematology [facility] [address] [place], [place] [address] Tel. [phone]    Fax. [phone]  This document serves as a record of services personally performed by Lurline Del, MD. It was created on his behalf by Wilburn Mylar, a trained medical scribe. The  creation of this record is based on the scribe's personal observations and the provider's statements to them.   I, Lurline Del MD, have reviewed the above documentation for accuracy and completeness, and I agree with the above.

## 2018-03-28 ENCOUNTER — Encounter: Payer: Self-pay | Admitting: Oncology

## 2018-03-28 ENCOUNTER — Inpatient Hospital Stay: Payer: Medicare Other | Attending: Oncology

## 2018-03-28 ENCOUNTER — Inpatient Hospital Stay (HOSPITAL_BASED_OUTPATIENT_CLINIC_OR_DEPARTMENT_OTHER): Payer: Medicare Other | Admitting: Oncology

## 2018-03-28 ENCOUNTER — Encounter: Payer: Self-pay | Admitting: Adult Health

## 2018-03-28 ENCOUNTER — Other Ambulatory Visit: Payer: Self-pay | Admitting: *Deleted

## 2018-03-28 VITALS — BP 144/59 | HR 58 | Temp 98.0°F | Resp 18 | Ht 64.57 in | Wt 183.3 lb

## 2018-03-28 DIAGNOSIS — Z85828 Personal history of other malignant neoplasm of skin: Secondary | ICD-10-CM | POA: Insufficient documentation

## 2018-03-28 DIAGNOSIS — Z803 Family history of malignant neoplasm of breast: Secondary | ICD-10-CM | POA: Insufficient documentation

## 2018-03-28 DIAGNOSIS — C50411 Malignant neoplasm of upper-outer quadrant of right female breast: Secondary | ICD-10-CM

## 2018-03-28 DIAGNOSIS — Z170 Estrogen receptor positive status [ER+]: Principal | ICD-10-CM

## 2018-03-28 DIAGNOSIS — Z90710 Acquired absence of both cervix and uterus: Secondary | ICD-10-CM

## 2018-03-28 DIAGNOSIS — Z87891 Personal history of nicotine dependence: Secondary | ICD-10-CM

## 2018-03-28 DIAGNOSIS — M810 Age-related osteoporosis without current pathological fracture: Secondary | ICD-10-CM

## 2018-03-28 DIAGNOSIS — Z807 Family history of other malignant neoplasms of lymphoid, hematopoietic and related tissues: Secondary | ICD-10-CM | POA: Diagnosis not present

## 2018-03-28 DIAGNOSIS — C50919 Malignant neoplasm of unspecified site of unspecified female breast: Secondary | ICD-10-CM

## 2018-03-28 DIAGNOSIS — Z853 Personal history of malignant neoplasm of breast: Secondary | ICD-10-CM

## 2018-03-28 LAB — CMP (CANCER CENTER ONLY)
ALBUMIN: 3.8 g/dL (ref 3.5–5.0)
ALK PHOS: 83 U/L (ref 38–126)
ALT: 10 U/L (ref 0–44)
ANION GAP: 7 (ref 5–15)
AST: 18 U/L (ref 15–41)
BILIRUBIN TOTAL: 0.4 mg/dL (ref 0.3–1.2)
BUN: 10 mg/dL (ref 8–23)
CALCIUM: 9.6 mg/dL (ref 8.9–10.3)
CO2: 28 mmol/L (ref 22–32)
Chloride: 106 mmol/L (ref 98–111)
Creatinine: 1.01 mg/dL — ABNORMAL HIGH (ref 0.44–1.00)
GFR, Est AFR Am: 60 mL/min — ABNORMAL LOW (ref >60)
GFR, Estimated: 51 mL/min — ABNORMAL LOW (ref >60)
GLUCOSE: 106 mg/dL — AB (ref 70–99)
POTASSIUM: 4.3 mmol/L (ref 3.5–5.1)
Sodium: 141 mmol/L (ref 135–145)
TOTAL PROTEIN: 6.9 g/dL (ref 6.5–8.1)

## 2018-03-28 LAB — CBC WITH DIFFERENTIAL (CANCER CENTER ONLY)
Abs Immature Granulocytes: 0.02 10*3/uL (ref 0.00–0.07)
Basophils Absolute: 0.1 10*3/uL (ref 0.0–0.1)
Basophils Relative: 1 %
Eosinophils Absolute: 0.1 10*3/uL (ref 0.0–0.5)
Eosinophils Relative: 2 %
HCT: 37.9 % (ref 36.0–46.0)
Hemoglobin: 12 g/dL (ref 12.0–15.0)
Immature Granulocytes: 0 %
Lymphocytes Relative: 22 %
Lymphs Abs: 1.7 10*3/uL (ref 0.7–4.0)
MCH: 32 pg (ref 26.0–34.0)
MCHC: 31.7 g/dL (ref 30.0–36.0)
MCV: 101.1 fL — AB (ref 80.0–100.0)
MONO ABS: 0.7 10*3/uL (ref 0.1–1.0)
MONOS PCT: 10 %
NEUTROS ABS: 5 10*3/uL (ref 1.7–7.7)
NEUTROS PCT: 65 %
Platelet Count: 228 10*3/uL (ref 150–400)
RBC: 3.75 MIL/uL — ABNORMAL LOW (ref 3.87–5.11)
RDW: 12.2 % (ref 11.5–15.5)
WBC Count: 7.6 10*3/uL (ref 4.0–10.5)
nRBC: 0 % (ref 0.0–0.2)

## 2018-03-28 MED ORDER — ANASTROZOLE 1 MG PO TABS: 1.0000 mg | ORAL_TABLET | Freq: Every day | ORAL | 4 refills | Status: DC

## 2018-03-28 NOTE — Progress Notes (Signed)
Location of Breast Cancer: Recurrent Right Breast Cancer  Did patient present with symptoms (if so, please note symptoms) or was this found on screening mammography?:   Ultrasound: 1.7 cm mass present at the areolar margin with a single normal node seen in the right axilla.  Mammogram: stable mammosite cavity as well as a possible 2 cm mass noted in the right breast.  There is benign dystrophic calcification.   Histology per Pathology Report: Right Breast 03/14/2018   Receptor Status: ER (100% +), PR (100% +), Her2-neu (-), Ki-67(10%)    Past/Anticipated interventions by surgeon, if any: Dr. Donne Hazel 03/27/2018 - We discussed all of the different options for treatment. -I don't think she needs SN biopsy given her age and tumor size. -Will see rad onc and med onc to see if all agreeable. -I also think that she could get another lumpectomy vs a mastectomy, she may very well not need any radiotherapy as long as she does antiestrogen therapy postop. -She understands her further therapy will be based on what her stages are at the time of her operation. -? Lumpectomy  Past/Anticipated interventions by medical oncology, if any: Chemotherapy  Dr. Jana Hakim 03/28/2018 4 pm -take Arimidex -Hopeful for lumpectomy with no radiation   Lymphedema issues, if any: No  Pain issues, if any:  No  BP (!) 143/69 (BP Location: Left Arm, Patient Position: Sitting)   Pulse 60   Temp 98 F (36.7 C) (Oral)   Resp 18   Ht 5' 7"  (1.702 m)   Wt 186 lb 4 oz (84.5 kg)   SpO2 97%   BMI 29.17 kg/m    Wt Readings from Last 3 Encounters:  03/29/18 186 lb 4 oz (84.5 kg)  03/28/18 183 lb 4.8 oz (83.1 kg)  12/07/17 184 lb (83.5 kg)    SAFETY ISSUES:  Prior radiation? Mammosite 2013.  Pacemaker/ICD? No  Possible current pregnancy? No  Is the patient on methotrexate? No  Current Complaints / other details:   History -2013: lumpectomy, SLN, mammosite- .52 cm with negative margins, 2 SN negative,  100 % ER/PR +, Her2 -, Ki 10%- she declined antiestrogens. - Endometrial cancer -SCC of her eyelid -Lymphedema in right leg     Cori Razor, RN 03/28/2018,3:06 PM

## 2018-03-29 ENCOUNTER — Encounter: Payer: Self-pay | Admitting: Radiation Oncology

## 2018-03-29 ENCOUNTER — Encounter: Payer: Self-pay | Admitting: *Deleted

## 2018-03-29 ENCOUNTER — Other Ambulatory Visit: Payer: Self-pay

## 2018-03-29 ENCOUNTER — Ambulatory Visit
Admission: RE | Admit: 2018-03-29 | Discharge: 2018-03-29 | Disposition: A | Discharge status: Home / Self Care | Payer: Medicare Other | Source: Ambulatory Visit | Attending: Radiation Oncology | Admitting: Radiation Oncology

## 2018-03-29 ENCOUNTER — Telehealth: Payer: Self-pay | Admitting: Oncology

## 2018-03-29 VITALS — BP 143/69 | HR 60 | Temp 98.0°F | Resp 18 | Ht 67.0 in | Wt 186.2 lb

## 2018-03-29 DIAGNOSIS — Z803 Family history of malignant neoplasm of breast: Secondary | ICD-10-CM | POA: Insufficient documentation

## 2018-03-29 DIAGNOSIS — C50411 Malignant neoplasm of upper-outer quadrant of right female breast: Secondary | ICD-10-CM | POA: Diagnosis not present

## 2018-03-29 DIAGNOSIS — Z8542 Personal history of malignant neoplasm of other parts of uterus: Secondary | ICD-10-CM | POA: Diagnosis not present

## 2018-03-29 DIAGNOSIS — K449 Diaphragmatic hernia without obstruction or gangrene: Secondary | ICD-10-CM | POA: Diagnosis not present

## 2018-03-29 DIAGNOSIS — Z923 Personal history of irradiation: Secondary | ICD-10-CM | POA: Diagnosis not present

## 2018-03-29 DIAGNOSIS — I714 Abdominal aortic aneurysm, without rupture: Secondary | ICD-10-CM | POA: Insufficient documentation

## 2018-03-29 DIAGNOSIS — Z170 Estrogen receptor positive status [ER+]: Principal | ICD-10-CM

## 2018-03-29 DIAGNOSIS — I10 Essential (primary) hypertension: Secondary | ICD-10-CM | POA: Insufficient documentation

## 2018-03-29 DIAGNOSIS — Z85820 Personal history of malignant melanoma of skin: Secondary | ICD-10-CM | POA: Insufficient documentation

## 2018-03-29 DIAGNOSIS — Z9889 Other specified postprocedural states: Secondary | ICD-10-CM | POA: Diagnosis not present

## 2018-03-29 DIAGNOSIS — K219 Gastro-esophageal reflux disease without esophagitis: Secondary | ICD-10-CM | POA: Insufficient documentation

## 2018-03-29 DIAGNOSIS — E785 Hyperlipidemia, unspecified: Secondary | ICD-10-CM | POA: Insufficient documentation

## 2018-03-29 DIAGNOSIS — Z87891 Personal history of nicotine dependence: Secondary | ICD-10-CM | POA: Diagnosis not present

## 2018-03-29 DIAGNOSIS — Z8673 Personal history of transient ischemic attack (TIA), and cerebral infarction without residual deficits: Secondary | ICD-10-CM | POA: Diagnosis not present

## 2018-03-29 HISTORY — DX: Cholecystitis, unspecified: K81.9

## 2018-03-29 NOTE — Telephone Encounter (Signed)
Scheduled appt per 2/26 pt is aware pf appt date and time

## 2018-03-29 NOTE — Progress Notes (Signed)
Radiation Oncology         [phone] ________________________________  Name: Michelle [name]        MRN: [id number]  Date of Service: 03/29/2018 DOB: September 20, 1932  [id number], Rene Kocher, MD  Magrinat, [name], MD     REFERRING PHYSICIAN: Magrinat, [name], MD   DIAGNOSIS: The encounter diagnosis was Malignant neoplasm of upper-outer quadrant of right breast in female, estrogen receptor positive ([place]).   HISTORY OF PRESENT ILLNESS: Michelle [name] is a 85 y.o. female seen at the request of Dr. Jana Hakim for a history of locally recurrent Stage IA, pT1bN0M0 grade 1, ER/PR positive invasive ductal carcinoma of the right breast. She underwent lumpectomy with sentinel node biopsy on 03/14/11 and subsequent partial breast irradiation with mammosite that was completed in February 2013 with Dr. Pablo Ledger. She had been doing well but had declined antiestrogen therapy. She had a recent mammogram on 03/06/2018 that revealed a possible mass along her prior lumpectomy site. Diagnostic ultrasound revealed a 17 mm irregular mass in the right breast was noted and suspicious for malignancy. She underwent a biopsy of this site on 03/14/2018 which revealed a grade 2 invasive ductal carcinoma, ER/PR positive, HER2 negative, with a Ki67 of 10%. She has plans to undergo re-excision with lumpectomy with Dr. Donne Hazel and comes today to discuss whether there are any additional local options available with radiation.   PREVIOUS RADIATION THERAPY: Yes  03/18/2011 to 03/24/2011:  Partial Breast Irradiation with Mammosite, totaling 34 Gy at 3.4 Gy per fraction x10 fractions given in a BID technique.  PAST MEDICAL HISTORY:  Past Medical History:  Diagnosis Date  . AAA (abdominal aortic aneurysm) ([place]) 10/2013  . Basal cell carcinoma 2011   R back  . Bell's palsy 1984  . Bradycardia   . Breast cancer ([place]) 02/2011   US-guided biopsy  . Cataract   . Cholecystitis 2004  . Endometrial cancer ([place]) 05/2013   uterine ca    . GERD (gastroesophageal reflux disease)   . Hiatal hernia 2008  . History of breast cancer   . History of cerebrovascular accident 2009  . Hyperlipidemia   . Hypertension 2012  . Macular degeneration   . Polyp of colon 2008, 2014   Benign  . Shingles 2008  . Squamous cell carcinoma    2010-2015  . Squamous cell skin cancer, multiple sites 2014   recurrent  . Stroke ([place]) 09/2007   Slight memory problems residual  . Tendonitis    rt hand  . Vocal cord polyp        PAST SURGICAL HISTORY: Past Surgical History:  Procedure Laterality Date  . APPENDECTOMY    . BASAL CELL CARCINOMA EXCISION    . biospy  10/28/09   Shave Biospy skin Left hand(Acitinic Keratoses), Right Upper Arm (superficial Basal Cell), Right Upper Back(Superficial Basal Cell), Left Neck ( Solar Lentigo & Seborrheic Keratoses)  . BREAST BIOPSY  02/11/11   Right Breast Needle Core Biospy - Upper Outer Quadrant; ER/PR 100%, Her-2 Neu neg.; Ki-67 10%  . BREAST MAMMOSITE  03/14/2011   Procedure: MAMMOSITE BREAST;  Surgeon: Rolm Bookbinder, MD;  Location: [facility];  Service: General;  Laterality: Right;  . BREAST SURGERY     right breast lumpectomy snbx  . CHOLECYSTECTOMY  2004  . COLONOSCOPY    . COLONOSCOPY W/ POLYPECTOMY     2008, 2014 (benign)  . COLONOSCOPY WITH PROPOFOL N/A 07/09/2015   Procedure: COLONOSCOPY WITH PROPOFOL;  Surgeon: Kenney Houseman  Penelope Coop, MD;  Location: [facility];  Service: Endoscopy;  Laterality: N/A;  . DILATION AND CURETTAGE OF UTERUS N/A 04/24/2013   Procedure: DILATATION AND CURETTAGE;  Surgeon: Guss Bunde, MD;  Location: [facility];  Service: Gynecology;  Laterality: N/A;  . ESOPHAGOGASTRODUODENOSCOPY N/A 07/06/2015   Procedure: ESOPHAGOGASTRODUODENOSCOPY (EGD);  Surgeon: Wilford Corner, MD;  Location: [facility];  Service: Endoscopy;  Laterality: N/A;  . HYSTEROSCOPY N/A 04/24/2013   Procedure: HYSTEROSCOPY;  Surgeon: Guss Bunde, MD;  Location: [facility];  Service:  Gynecology;  Laterality: N/A;  . ROBOTIC ASSISTED TOTAL HYSTERECTOMY WITH BILATERAL SALPINGO OOPHERECTOMY Bilateral 06/11/2013   Procedure: ROBOTIC ASSISTED TOTAL HYSTERECTOMY WITH BILATERAL SALPINGO OOPHORECTOMY WITH POSSIBLE STAGING, EPISIOTOMY;  Surgeon: Janie Morning, MD;  Location: [facility];  Service: Gynecology;  Laterality: Bilateral;  . SQUAMOUS CELL CARCINOMA EXCISION    . TEAR DUCT PROBING    . TONSILLECTOMY    . vocal cord poylps  1969   excision     FAMILY HISTORY:  Family History  Problem Relation Age of Onset  . Heart disease Father   . Cancer Sister 74       Lymphoma  . Breast cancer Sister   . Cancer Paternal Aunt        Breast cancer  . Cancer Daughter 29       Bilateral Breast Cancer  . Breast cancer Daughter 15  . Cancer Daughter        Breast Cancer  . Breast cancer Daughter   . Cancer Sister        2005 - Renal Cell Cancer and Breast Cancer  . Breast cancer Sister      SOCIAL HISTORY:  reports that she quit smoking about 22 years ago. She has a 30.00 pack-year smoking history. She has never used smokeless tobacco. She reports current alcohol use of about 1.0 standard drinks of alcohol per week. She reports that she does not use drugs. She is married and lives in [place]. She is accompanied by her husband and daughter.   ALLERGIES: Benadryl [diphenhydramine hcl]; Doxycycline; Simvastatin; Fosamax [alendronate sodium]; Lisinopril; and Nsaids   MEDICATIONS:  Current Outpatient Medications  Medication Sig Dispense Refill  . acetaminophen (TYLENOL) 325 MG tablet Take 650 mg by mouth as needed for mild pain.     Marland Kitchen anastrozole (ARIMIDEX) 1 MG tablet Take 1 tablet (1 mg total) by mouth daily. 90 tablet 4  . atorvastatin (LIPITOR) 20 MG tablet TAKE 1 TABLET BY MOUTH  EVERY OTHER NIGHT. 45 tablet 3  . clopidogrel (PLAVIX) 75 MG tablet TAKE 1 TABLET BY MOUTH  DAILY 90 tablet 1  . fluticasone (FLONASE) 50 MCG/ACT nasal spray INSTILL 2 SPRAYS IN EACH  NOSTRIL  EVERY DAY 48 g 4  . losartan (COZAAR) 25 MG tablet TAKE 1 TABLET BY MOUTH  DAILY 90 tablet 1  . Multiple Vitamins-Minerals (ICAPS AREDS 2 PO) Take by mouth.    . Propylene Glycol (SYSTANE COMPLETE OP) Apply to eye.    . ranitidine (ZANTAC) 150 MG tablet Take 1 tablet (150 mg total) by mouth 2 (two) times daily. 180 tablet 3  . VIACTIV 102-585-27 MG-UNT-MCG CHEW CHEW 1 TABLET BY MOUTH EVERY MORNING.  12   No current facility-administered medications for this encounter.      REVIEW OF SYSTEMS: On review of systems, the patient reports that she is doing well overall. She denies any chest pain, shortness of breath, cough, fevers, chills, night sweats, unintended weight changes. She denies any bowel  or bladder disturbances, and denies abdominal pain, nausea or vomiting. She denies any new musculoskeletal or joint aches or pains. A complete review of systems is obtained and is otherwise negative.     PHYSICAL EXAM:  Wt Readings from Last 3 Encounters:  03/29/18 186 lb 4 oz (84.5 kg)  03/28/18 183 lb 4.8 oz (83.1 kg)  12/07/17 184 lb (83.5 kg)   Temp Readings from Last 3 Encounters:  03/29/18 98 F (36.7 C) (Oral)  03/28/18 98 F (36.7 C) (Oral)  07/09/15 97.3 F (36.3 C)   BP Readings from Last 3 Encounters:  03/29/18 (!) 143/69  03/28/18 (!) 144/59  12/07/17 131/67   Pulse Readings from Last 3 Encounters:  03/29/18 60  03/28/18 (!) 58  12/07/17 (!) 58   Pain Assessment Pain Score: 0-No pain/10  In general this is a well appearing caucasian female in no acute distress. She's alert and oriented x4 and appropriate throughout the examination. Cardiopulmonary assessment is negative for acute distress and she exhibits normal effort. Breast exam is deferred.   ECOG = 0  0 - Asymptomatic (Fully active, able to carry on all predisease activities without restriction)  1 - Symptomatic but completely ambulatory (Restricted in physically strenuous activity but ambulatory and able to  carry out work of a light or sedentary nature. For example, light housework, office work)  2 - Symptomatic, <50% in bed during the day (Ambulatory and capable of all self care but unable to carry out any work activities. Up and about more than 50% of waking hours)  3 - Symptomatic, >50% in bed, but not bedbound (Capable of only limited self-care, confined to bed or chair 50% or more of waking hours)  4 - Bedbound (Completely disabled. Cannot carry on any self-care. Totally confined to bed or chair)  5 - Death   Eustace Pen MM, Creech RH, Tormey DC, et al. [phone]). "Toxicity and response criteria of the [facility]". [place] Oncol. 5 (6): 649-55    LABORATORY DATA:  Lab Results  Component Value Date   WBC 7.6 03/28/2018   HGB 12.0 03/28/2018   HCT 37.9 03/28/2018   MCV 101.1 (H) 03/28/2018   PLT 228 03/28/2018   Lab Results  Component Value Date   NA 141 03/28/2018   K 4.3 03/28/2018   CL 106 03/28/2018   CO2 28 03/28/2018   Lab Results  Component Value Date   ALT 10 03/28/2018   AST 18 03/28/2018   ALKPHOS 83 03/28/2018   BILITOT 0.4 03/28/2018      RADIOGRAPHY: No results found.     IMPRESSION/PLAN: 1. Recurrent Stage IA, pT1bN0M0 ER positive invasive ductal carcinoma of the right breast. Dr. Lisbeth Renshaw discusses the pathology findings and reviews the nature of recurrent breast disease. The consensus from the breast conference includes proceeding with lumpectomy and adjuvant antiestrogen therapy. She met with Dr. Lisbeth Renshaw today and after discussion, there is limited data on utilizing whole breast radiotherapy following partial breast irradiation. Given that there are less invasive approaches that carry less risk of toxicity compared to re-irradiation, Dr. Lisbeth Renshaw recommends forgoing radiation in this scenario. He discusses the risks of local side effects would be higher in a situation where someone is re-irradiated and that if there was a risk of recurrence, then  he would be willing to take that risk. If that were a decision we'd have to re-consider, a longer course of radiotherapy would be considered. We will follow along with her course, and  could revisit this discussion at any point.   In a visit lasting 60 minutes, greater than 50% of the time was spent face to face discussing her case, and coordinating the patient's care.  The above documentation reflects my direct findings during this shared patient visit. Please see the separate note by Dr. Lisbeth Renshaw on this date for the remainder of the patient's plan of care.    Carola Rhine, PAC

## 2018-03-30 ENCOUNTER — Other Ambulatory Visit: Payer: Self-pay | Admitting: General Surgery

## 2018-04-03 ENCOUNTER — Telehealth: Payer: Self-pay

## 2018-04-03 NOTE — Telephone Encounter (Signed)
Dr Cristal Generous office called for a surgical clearance/blood thinner instructions. I looked through all the papers in Dr Gardiner Ramus basket and Tonya's basket. Unable to locate. I called the office for them to fax the form again.

## 2018-04-03 NOTE — Telephone Encounter (Signed)
Received office note. Placed in Dr Gardiner Ramus basket.

## 2018-04-04 ENCOUNTER — Other Ambulatory Visit: Payer: Self-pay | Admitting: General Surgery

## 2018-04-04 ENCOUNTER — Encounter: Payer: Self-pay | Admitting: Family Medicine

## 2018-04-05 ENCOUNTER — Other Ambulatory Visit: Payer: Self-pay | Admitting: Oncology

## 2018-04-05 NOTE — Progress Notes (Signed)
[place]  [phone] [phone]) [phone]     ID: SHAREL BEHNE DOB: 06-24-1932  MR#: [id number]  DJS#:[id number]  Patient Care Team: Hali Marry, MD as PCP - General (Family Medicine) Hali Marry, MD (Family Medicine) Rolm Bookbinder, MD as Attending Physician (Dermatology) Monna Fam, MD as Attending Physician (Ophthalmology) Magrinat, [name], MD as Consulting Physician (Oncology) Rolm Bookbinder, MD as Consulting Physician (General Surgery) Kyung Rudd, MD as Consulting Physician (Radiation Oncology) Chauncey Cruel, MD OTHER MD:  CHIEF COMPLAINT: Recurrent estrogen receptor positive breast cancer  CURRENT TREATMENT: Awaiting definitive surgery   HISTORY OF CURRENT ILLNESS: Michelle [name] has a history of right breast invasive ductal carcinoma status-post right lumpectomy with sentinel lymph node biopsy 03/14/2011 ([id number]), for a 0.52 cm, grade 1 invasive ductal carcinoma, both sentinel lymph nodes clear; margins negative; estrogen receptor 100% positive, progesterone receptor 100% positive, both with strong staining intensity, HER2 negative, with an MIB-1 of 10%. She underwent MammoSite therapy between 03/18/2011 and 03/25/2011.  Antiestrogen therapy was discussed with the patient by my former partner Dr. Chancy Milroy on 05/19/2011, with the patient declining.  Genetics testing for the BRCA 1 and 2 gene mutations was obtained 02/28/2011 and was negative.  She had routine screening mammography on 03/06/2018 showing a possible abnormality in the right breast. She proceeded to bilateral diagnostic mammography with tomography and right breast ultrasonography at [facility] on the same day showing: a 1.7 irregular, hypoechoic mass at the 11-12 o'clock areolar margin; one normal lymph node was seen.  Accordingly on 03/14/2018 she proceeded to biopsy of the right breast area in question. The pathology ([id number]) from this procedure showed:  invasive ductal carcinoma, grade 2. Prognostic indicators significant for: estrogen receptor, 100% positive and progesterone receptor, 100% positive, both with strong staining intensity. Proliferation marker Ki67 at 10%. HER2 negative (1+) by immunohistochemistry.  Of note, she also has a personal history of uterine cancer status post total abdominal hysterectomy and bilateral salpingo-oophorectomy 06/11/2013 for an invasive endometrioid carcinoma of the uterus, grade 1, confined within the inner half of the endometrium.  A total of 3 left pelvic and 10 right pelvic lymph nodes were all negative for a pT1a pN0 stage.   She has also had multiple abscesses for skin cancer (basal cell and squamous cell).  The patient's subsequent history is as detailed below.   INTERVAL HISTORY: Pria was evaluated in the breast cancer clinic on 03/28/2018 accompanied by her husband Mikki Santee and daughter [name]. She met with Dr. Donne Hazel yesterday, 03/27/2018, and she is scheduled to meet with Dr. Lisbeth Renshaw on 03/29/2018. Per patient, Dr. Donne Hazel plans for her to undergo lumpectomy.   REVIEW OF SYSTEMS: NIKIESHA MILFORD reports doing well overall. She enjoys housecleaning, raking, and gardening, but she doesn't do these things [name] anymore. There were no specific symptoms leading to the original mammogram, which was routinely scheduled. The patient denies unusual headaches, visual changes, nausea, vomiting, stiff neck, dizziness, or gait imbalance. There has been no cough, phlegm production, or pleurisy, no chest pain or pressure, and no change in bowel or bladder habits. The patient denies fever, rash, bleeding, unexplained fatigue or unexplained weight loss. A detailed review of systems was otherwise entirely negative.   PAST MEDICAL HISTORY: Past Medical History:  Diagnosis Date  . AAA (abdominal aortic aneurysm) ([place]) 10/2013  . Basal cell carcinoma 2011   R back  . Bell's palsy 1984  . Bradycardia   . Breast cancer ([place])  02/2011  US-guided biopsy  . Cataract   . Cholecystitis 2004  . Endometrial cancer ([place]) 05/2013   uterine ca  . GERD (gastroesophageal reflux disease)   . Hiatal hernia 2008  . History of breast cancer   . History of cerebrovascular accident 2009  . Hyperlipidemia   . Hypertension 2012  . Macular degeneration   . Polyp of colon 2008, 2014   Benign  . Shingles 2008  . Squamous cell carcinoma    2010-2015  . Squamous cell skin cancer, multiple sites 2014   recurrent  . Stroke ([place]) 09/2007   Slight memory problems residual  . Tendonitis    rt hand  . Vocal cord polyp     PAST SURGICAL HISTORY: Past Surgical History:  Procedure Laterality Date  . APPENDECTOMY    . BASAL CELL CARCINOMA EXCISION    . biospy  10/28/09   Shave Biospy skin Left hand(Acitinic Keratoses), Right Upper Arm (superficial Basal Cell), Right Upper Back(Superficial Basal Cell), Left Neck ( Solar Lentigo & Seborrheic Keratoses)  . BREAST BIOPSY  02/11/11   Right Breast Needle Core Biospy - Upper Outer Quadrant; ER/PR 100%, Her-2 Neu neg.; Ki-67 10%  . BREAST MAMMOSITE  03/14/2011   Procedure: MAMMOSITE BREAST;  Surgeon: Rolm Bookbinder, MD;  Location: [facility];  Service: General;  Laterality: Right;  . BREAST SURGERY     right breast lumpectomy snbx  . CHOLECYSTECTOMY  2004  . COLONOSCOPY    . COLONOSCOPY W/ POLYPECTOMY     2008, 2014 (benign)  . COLONOSCOPY WITH PROPOFOL N/A 07/09/2015   Procedure: COLONOSCOPY WITH PROPOFOL;  Surgeon: Wonda Horner, MD;  Location: [facility] ENDOSCOPY;  Service: Endoscopy;  Laterality: N/A;  . DILATION AND CURETTAGE OF UTERUS N/A 04/24/2013   Procedure: DILATATION AND CURETTAGE;  Surgeon: Guss Bunde, MD;  Location: [facility];  Service: Gynecology;  Laterality: N/A;  . ESOPHAGOGASTRODUODENOSCOPY N/A 07/06/2015   Procedure: ESOPHAGOGASTRODUODENOSCOPY (EGD);  Surgeon: Wilford Corner, MD;  Location: [facility] ENDOSCOPY;  Service: Endoscopy;  Laterality: N/A;  .  HYSTEROSCOPY N/A 04/24/2013   Procedure: HYSTEROSCOPY;  Surgeon: Guss Bunde, MD;  Location: [facility];  Service: Gynecology;  Laterality: N/A;  . ROBOTIC ASSISTED TOTAL HYSTERECTOMY WITH BILATERAL SALPINGO OOPHERECTOMY Bilateral 06/11/2013   Procedure: ROBOTIC ASSISTED TOTAL HYSTERECTOMY WITH BILATERAL SALPINGO OOPHORECTOMY WITH POSSIBLE STAGING, EPISIOTOMY;  Surgeon: Janie Morning, MD;  Location: [facility];  Service: Gynecology;  Laterality: Bilateral;  . SQUAMOUS CELL CARCINOMA EXCISION    . TEAR DUCT PROBING    . TONSILLECTOMY    . vocal cord poylps  1969   excision    FAMILY HISTORY Family History  Problem Relation Age of Onset  . Heart disease Father   . Cancer Sister 65       Lymphoma  . Breast cancer Sister   . Cancer Paternal Aunt        Breast cancer  . Cancer Daughter 25       Bilateral Breast Cancer  . Breast cancer Daughter 86  . Cancer Daughter        Breast Cancer  . Breast cancer Daughter   . Cancer Sister        2005 - Renal Cell Cancer and Breast Cancer  . Breast cancer Sister    Patient father was 11 years old when he died from heart issues. Patient mother died from heart issues at age 43. Patient has 3 siblings, 1 brother and 2 sisters.  Patient notes a family hx of  breast cancer in both sisters and both daughters. Patient's daughters have had extensive genetics evaluation with no deleterious mutation identified.   GYNECOLOGIC HISTORY:  No LMP recorded. Patient has had a hysterectomy. Menarche: 85 years old Age at first live birth: 85 years old [place] P 2 LMP 79, roughly 85 years old Contraceptive unknown HRT? No Hysterectomy? Yes, 06/11/2013 BSO? 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 She is married. Husband Mortimer Fries is a retired Airline pilot. They have 2 daughters, [name] and Fernando Salinas. The patient attends [employer]. She and her husband share 2 grandchildren and 4  great-grandchildren.     ADVANCED DIRECTIVES: Husband Mikki Santee is her HCPOA.   HEALTH MAINTENANCE: Social History   Tobacco Use  . Smoking status: Former Smoker    Packs/day: 1.00    Years: 30.00    Pack years: 30.00    Last attempt to quit: 02/01/1996    Years since quitting: 22.1  . Smokeless tobacco: Never Used  Substance Use Topics  . Alcohol use: Yes    Alcohol/week: 1.0 standard drinks    Types: 1 Glasses of wine per week    Comment: Ocassionaly once a year .  Marland Kitchen Drug use: No     Colonoscopy: 07/09/2015, negative, Dr. Penelope Coop  PAP: 09/29/2014, negative  Bone density: 11/01/2016, T-score of -1.5   Allergies  Allergen Reactions  . Benadryl [Diphenhydramine Hcl]     Hyper  . Doxycycline Other (See Comments)    acid reflux  . Simvastatin Other (See Comments)    REACTION: memory loss  . Fosamax [Alendronate Sodium] Other (See Comments)    GERD  . Lisinopril Other (See Comments)    Dry cough  . Nsaids Other (See Comments)    GI Bleed    Current Outpatient Medications  Medication Sig Dispense Refill  . acetaminophen (TYLENOL) 325 MG tablet Take 650 mg by mouth as needed for mild pain.     Marland Kitchen anastrozole (ARIMIDEX) 1 MG tablet Take 1 tablet (1 mg total) by mouth daily. 90 tablet 4  . atorvastatin (LIPITOR) 20 MG tablet TAKE 1 TABLET BY MOUTH  EVERY OTHER NIGHT. 45 tablet 3  . clopidogrel (PLAVIX) 75 MG tablet TAKE 1 TABLET BY MOUTH  DAILY 90 tablet 1  . fluticasone (FLONASE) 50 MCG/ACT nasal spray INSTILL 2 SPRAYS IN EACH  NOSTRIL EVERY DAY 48 g 4  . losartan (COZAAR) 25 MG tablet TAKE 1 TABLET BY MOUTH  DAILY 90 tablet 1  . Multiple Vitamins-Minerals (ICAPS AREDS 2 PO) Take by mouth.    . Propylene Glycol (SYSTANE COMPLETE OP) Apply to eye.    . ranitidine (ZANTAC) 150 MG tablet Take 1 tablet (150 mg total) by mouth 2 (two) times daily. 180 tablet 3  . VIACTIV [phone] MG-UNT-MCG CHEW CHEW 1 TABLET BY MOUTH EVERY MORNING.  12   No current facility-administered medications  for this visit.     OBJECTIVE: Older white woman in no acute distress  There were no vitals filed for this visit.   There is no height or weight on file to calculate BMI.   Wt Readings from Last 3 Encounters:  03/29/18 186 lb 4 oz (84.5 kg)  03/28/18 183 lb 4.8 oz (83.1 kg)  12/07/17 184 lb (83.5 kg)      ECOG FS:1 - Symptomatic but completely ambulatory  Ocular: Sclerae unicteric, pupils round and equal Lymphatic: No cervical or supraclavicular adenopathy  Lungs no rales or rhonchi Heart regular rate and rhythm Abd soft, nontender, positive bowel sounds MSK no focal spinal tenderness, no joint edema Neuro: non-focal, well-oriented, appropriate affect Breasts: Right breast is status post remote lumpectomy, MammoSite radiation, and recent biopsy.  There is moderate ecchymosis.  There is an irregular scar from the prior surgery.  The left breast is unremarkable.  Both axillae are benign.  LAB RESULTS:  CMP     Component Value Date/Time   NA 141 03/28/2018 1555   K 4.3 03/28/2018 1555   CL 106 03/28/2018 1555   CO2 28 03/28/2018 1555   GLUCOSE 106 (H) 03/28/2018 1555   BUN 10 03/28/2018 1555   BUN 12.3 11/01/2013 1031   CREATININE 1.01 (H) 03/28/2018 1555   CREATININE 0.95 (H) 12/21/2017 1026   CREATININE 0.9 11/01/2013 1031   CALCIUM 9.6 03/28/2018 1555   PROT 6.9 03/28/2018 1555   ALBUMIN 3.8 03/28/2018 1555   AST 18 03/28/2018 1555   ALT 10 03/28/2018 1555   ALKPHOS 83 03/28/2018 1555   BILITOT 0.4 03/28/2018 1555   GFRNONAA 51 (L) 03/28/2018 1555   GFRNONAA 55 (L) 12/21/2017 1026   GFRAA 60 (L) 03/28/2018 1555   GFRAA 64 12/21/2017 1026    No results found for: TOTALPROTELP, ALBUMINELP, A1GS, A2GS, BETS, BETA2SER, GAMS, MSPIKE, SPEI  No results found for: KPAFRELGTCHN, LAMBDASER, KAPLAMBRATIO  Lab Results  Component Value Date   WBC 7.6 03/28/2018   NEUTROABS 5.0 03/28/2018   HGB 12.0 03/28/2018   HCT 37.9 03/28/2018   MCV 101.1 (H) 03/28/2018   PLT 228  03/28/2018    _0 @  Lab Results  Component Value Date   LABCA2 77 (H) 03/08/2011    No components found for: [id number]  No results for input(s): INR in the last 168 hours.  Lab Results  Component Value Date   LABCA2 77 (H) 03/08/2011    No results found for: ZJQ734  No results found for: LPF790  No results found for: WIO973  No results found for: CA2729  No components found for: HGQUANT  No results found for: CEA1 / No results found for: CEA1   No results found for: AFPTUMOR  No results found for: [place]  No results found for: PSA1  No visits with results within 3 Day(s) from this visit.  Latest known visit with results is:  Appointment on 03/28/2018  Component Date Value Ref Range Status  . Sodium 03/28/2018 141  135 - 145 mmol/L Final  . Potassium 03/28/2018 4.3  3.5 - 5.1 mmol/L Final  . Chloride 03/28/2018 106  98 - 111 mmol/L Final  . CO2 03/28/2018 28  22 - 32 mmol/L Final  . Glucose, Bld 03/28/2018 106* 70 - 99 mg/dL Final  . BUN 03/28/2018 10  8 - 23 mg/dL Final  . Creatinine 03/28/2018 1.01* 0.44 - 1.00 mg/dL Final  . Calcium 03/28/2018 9.6  8.9 - 10.3 mg/dL Final  . Total Protein 03/28/2018 6.9  6.5 - 8.1 g/dL Final  . Albumin 03/28/2018 3.8  3.5 - 5.0 g/dL Final  . AST 03/28/2018 18  15 - 41 U/L Final  . ALT 03/28/2018 10  0 - 44 U/L Final  . Alkaline Phosphatase 03/28/2018 83  38 - 126 U/L Final  . Total Bilirubin 03/28/2018 0.4  0.3 - 1.2 mg/dL Final  . GFR, Est Non Af Am 03/28/2018 51* >60 mL/min Final  . GFR, Est AFR Am 03/28/2018 60* >60 mL/min Final  . Anion gap 03/28/2018  7  5 - 15 Final   Performed at [facility], [place] [address] [place], [place] [address]  . WBC Count 03/28/2018 7.6  4.0 - 10.5 K/uL Final  . RBC 03/28/2018 3.75* 3.87 - 5.11 MIL/uL Final  . Hemoglobin 03/28/2018 12.0  12.0 - 15.0 g/dL Final  . HCT 03/28/2018 37.9  36.0 - 46.0 % Final  . MCV 03/28/2018 101.1* 80.0 - 100.0 fL  Final  . MCH 03/28/2018 32.0  26.0 - 34.0 pg Final  . MCHC 03/28/2018 31.7  30.0 - 36.0 g/dL Final  . RDW 03/28/2018 12.2  11.5 - 15.5 % Final  . Platelet Count 03/28/2018 228  150 - 400 K/uL Final  . nRBC 03/28/2018 0.0  0.0 - 0.2 % Final  . Neutrophils Relative % 03/28/2018 65  % Final  . Neutro Abs 03/28/2018 5.0  1.7 - 7.7 K/uL Final  . Lymphocytes Relative 03/28/2018 22  % Final  . Lymphs Abs 03/28/2018 1.7  0.7 - 4.0 K/uL Final  . Monocytes Relative 03/28/2018 10  % Final  . Monocytes Absolute 03/28/2018 0.7  0.1 - 1.0 K/uL Final  . Eosinophils Relative 03/28/2018 2  % Final  . Eosinophils Absolute 03/28/2018 0.1  0.0 - 0.5 K/uL Final  . Basophils Relative 03/28/2018 1  % Final  . Basophils Absolute 03/28/2018 0.1  0.0 - 0.1 K/uL Final  . Immature Granulocytes 03/28/2018 0  % Final  . Abs Immature Granulocytes 03/28/2018 0.02  0.00 - 0.07 K/uL Final   Performed at [facility], [place] [address], [place], [place] [address]    (this displays the last labs from the last 3 days)  No results found for: TOTALPROTELP, ALBUMINELP, A1GS, A2GS, BETS, BETA2SER, GAMS, MSPIKE, SPEI (this displays SPEP labs)  No results found for: KPAFRELGTCHN, LAMBDASER, KAPLAMBRATIO (kappa/lambda light chains)  No results found for: HGBA, HGBA2QUANT, HGBFQUANT, HGBSQUAN (Hemoglobinopathy evaluation)   Lab Results  Component Value Date   LDH 131 07/04/2015    Lab Results  Component Value Date   IRON 179 (H) 07/04/2015   TIBC 249 (L) 07/04/2015   IRONPCTSAT 72 (H) 07/04/2015   (Iron and TIBC)  Lab Results  Component Value Date   FERRITIN 56 11/10/2015    Urinalysis    Component Value Date/Time   COLORURINE YELLOW 10/21/2013 [facility] 10/21/2013 1645   LABSPEC 1.013 10/21/2013 1645   PHURINE 5.5 10/21/2013 1645   GLUCOSEU NEG 10/21/2013 1645   HGBUR NEG 10/21/2013 1645   BILIRUBINUR NEG 10/21/2013 1645   KETONESUR NEG 10/21/2013 1645    PROTEINUR NEG 10/21/2013 1645   UROBILINOGEN 0.2 10/21/2013 1645   NITRITE NEG 10/21/2013 1645   LEUKOCYTESUR TRACE (A) 10/21/2013 1645     STUDIES: Pathology results discussed with the patient  ELIGIBLE FOR AVAILABLE RESEARCH PROTOCOL: no   ASSESSMENT: 85 y.o. [place] woman  (1) status post right upper outer quadrant lumpectomy 03/14/2011 for a pT1b (0.52 cm), pN0 invasive ductal carcinoma, grade 1, estrogen and progesterone receptor positive, HER-2 nonamplified, with an MIB-1 of 10%; a total of 2 axillary lymph nodes were removed.  (a) status post MammoSite radiation between 03/17/2018 and 03/24/2018  (b) opted against adjuvant antiestrogens  (2) status post total abdominal hysterectomy and bilateral salpingo-oophorectomy 06/11/2013 for a pT1a pN0 invasive endometrioid carcinoma, FIGO grade 1, a total of 3 left and 10 right pelvic lymph nodes all clear  (3) status post right breast upper outer quadrant biopsy 03/14/2018 for  a clinical T1c N0 invasive ductal carcinoma, grade 2, strongly estrogen and progesterone receptor positive, HER-2 nonamplified, with an MIB-1 of 10%.  (4) definitive surgery pending  (5) anastrozole started 04/01/2018  (6) consider adjuvant radiation  PLAN: I spent approximately 60 minutes face to face with Letta Median with more than 50% of that time spent in counseling and coordination of care. Specifically we reviewed the biology of the patient's diagnosis and the specifics of her situation.  She understands this cancer is very similar to the prior one phenotypically and is in the same location.  Most likely this is a local recurrence of the tumor.  While her first cancer was essentially a T1a, this one is T1c.  For T1a tumors the chance of outside the breast spread is so small that systemic therapy is optional.  For T1c, while the risk is low, nevertheless the possibility of spread outside the breast is sufficiently a concern that systemic therapy is recommended.   In her situation this means antiestrogens.  We discussed the possible toxicity side effects and complications of anastrozole and emphasized that this medication does not affect clotting one way or another. I have gone ahead and placed the prescription in for her.  She will be starting it on 04/01/2018.  The main concern with this medication is worsening osteopenia or osteoporosis.  She has not had a bone density in some time and I am requesting that test to be done at [facility] sometime in a few weeks so that we can take any measures necessary to prevent for example a hip fracture in the future.  I have discussed the possibility of additional radiation in a patient who had MammoSite radiation previously with Dr. Lisbeth Renshaw.  He is very doubtful this would be of benefit to the patient but tells me he is going to be looking into it and Analiyah has an appointment with him tomorrow.  The main point of starting anastrozole now is that 2 months from now, just a few weeks after her surgery, the patient will already have been on anastrozole long enough to know if she is going to be able to tolerate it.  If she does not tolerated then radiation becomes more of a possibility.  Jamari tells me she is very motivated to take her medication.  She would agree to radiation as well if that was felt to be necessary.  She understands she is also a candidate for additional genetics testing even though her daughters have been tested extensively and found to be negative.  We can discuss that further at the next visit.  Mackenzie has a good understanding of the overall plan. She agrees with it. She knows the goal of treatment in her case is cure. She will call with any problems that may develop before her next visit here.  Chauncey Cruel, MD   04/05/2018 6:17 PM Medical Oncology and Hematology [facility] [address] [place], [place] [address] Tel. [phone]    Fax. [phone]  This document serves as a record of  services personally performed by Lurline Del, MD. It was created on his behalf by Wilburn Mylar, a trained medical scribe. The creation of this record is based on the scribe's personal observations and the provider's statements to them.   I, Lurline Del MD, have reviewed the above documentation for accuracy and completeness, and I agree with the above.

## 2018-04-05 NOTE — Telephone Encounter (Signed)
Note completed and faxed yesterday.

## 2018-04-10 ENCOUNTER — Encounter: Payer: Self-pay | Admitting: *Deleted

## 2018-04-16 ENCOUNTER — Other Ambulatory Visit: Payer: Self-pay | Admitting: Family Medicine

## 2018-04-18 ENCOUNTER — Encounter (HOSPITAL_BASED_OUTPATIENT_CLINIC_OR_DEPARTMENT_OTHER): Payer: Self-pay | Admitting: *Deleted

## 2018-04-18 ENCOUNTER — Other Ambulatory Visit: Payer: Self-pay

## 2018-04-18 DIAGNOSIS — H353132 Nonexudative age-related macular degeneration, bilateral, intermediate dry stage: Secondary | ICD-10-CM | POA: Diagnosis not present

## 2018-04-26 ENCOUNTER — Ambulatory Visit (HOSPITAL_BASED_OUTPATIENT_CLINIC_OR_DEPARTMENT_OTHER): Admission: RE | Admit: 2018-04-26 | Payer: Medicare Other | Source: Home / Self Care | Admitting: General Surgery

## 2018-04-26 HISTORY — DX: Other specified postprocedural states: R11.2

## 2018-04-26 HISTORY — DX: Adverse effect of unspecified anesthetic, initial encounter: T41.45XA

## 2018-04-26 HISTORY — DX: Other complications of anesthesia, initial encounter: T88.59XA

## 2018-04-26 HISTORY — DX: Other specified postprocedural states: Z98.890

## 2018-04-26 HISTORY — DX: Anxiety disorder, unspecified: F41.9

## 2018-04-26 SURGERY — BREAST LUMPECTOMY
Anesthesia: General | Laterality: Right

## 2018-05-18 ENCOUNTER — Telehealth: Payer: Self-pay

## 2018-05-18 DIAGNOSIS — H353132 Nonexudative age-related macular degeneration, bilateral, intermediate dry stage: Secondary | ICD-10-CM | POA: Diagnosis not present

## 2018-05-18 NOTE — Telephone Encounter (Signed)
Aalivia called and would like a different medication to replace the Zantac.

## 2018-05-21 ENCOUNTER — Other Ambulatory Visit: Payer: Self-pay | Admitting: General Surgery

## 2018-05-21 DIAGNOSIS — C50911 Malignant neoplasm of unspecified site of right female breast: Secondary | ICD-10-CM | POA: Diagnosis not present

## 2018-05-21 MED ORDER — FAMOTIDINE 20 MG PO TABS: 20.0000 mg | ORAL_TABLET | Freq: Two times a day (BID) | ORAL | 1 refills | Status: DC

## 2018-05-21 NOTE — Telephone Encounter (Signed)
Zantac switch to famotidine.  Prescription sent to mail order.

## 2018-05-22 ENCOUNTER — Encounter: Payer: Self-pay | Admitting: *Deleted

## 2018-05-22 NOTE — Telephone Encounter (Signed)
Patient's husband advised 

## 2018-05-23 ENCOUNTER — Telehealth: Payer: Self-pay | Admitting: Oncology

## 2018-05-23 NOTE — Telephone Encounter (Signed)
Called patient regarding updated appointments, patient is notified and calender will be mailed.

## 2018-05-28 ENCOUNTER — Ambulatory Visit: Payer: Medicare Other | Admitting: Oncology

## 2018-06-04 ENCOUNTER — Other Ambulatory Visit: Payer: Self-pay

## 2018-06-04 ENCOUNTER — Encounter (HOSPITAL_BASED_OUTPATIENT_CLINIC_OR_DEPARTMENT_OTHER): Payer: Self-pay | Admitting: *Deleted

## 2018-06-05 ENCOUNTER — Encounter: Payer: Self-pay | Admitting: Family Medicine

## 2018-06-05 ENCOUNTER — Ambulatory Visit (INDEPENDENT_AMBULATORY_CARE_PROVIDER_SITE_OTHER): Payer: Medicare Other | Admitting: Family Medicine

## 2018-06-05 VITALS — BP 122/63 | HR 60 | Temp 96.9°F | Ht 64.57 in | Wt 181.0 lb

## 2018-06-05 DIAGNOSIS — I714 Abdominal aortic aneurysm, without rupture, unspecified: Secondary | ICD-10-CM

## 2018-06-05 DIAGNOSIS — I10 Essential (primary) hypertension: Secondary | ICD-10-CM | POA: Diagnosis not present

## 2018-06-05 DIAGNOSIS — Z170 Estrogen receptor positive status [ER+]: Secondary | ICD-10-CM

## 2018-06-05 DIAGNOSIS — C50411 Malignant neoplasm of upper-outer quadrant of right female breast: Secondary | ICD-10-CM | POA: Diagnosis not present

## 2018-06-05 DIAGNOSIS — I7143 Infrarenal abdominal aortic aneurysm, without rupture: Secondary | ICD-10-CM

## 2018-06-05 DIAGNOSIS — H938X1 Other specified disorders of right ear: Secondary | ICD-10-CM | POA: Diagnosis not present

## 2018-06-05 NOTE — Progress Notes (Signed)
A few months ago she hears an "echo" in her R ear only. She said that it only happens when she turns her head in either direction. This only last for a few seconds. She denies any pain. The sound is very soft. She wanted to make Dr. Madilyn Fireman aware.Michelle [name], [name], CMA

## 2018-06-05 NOTE — Progress Notes (Signed)
Virtual Visit via Telephone Note  I connected with Michelle [name] on 06/05/18 at  1:40 PM EDT by telephone and verified that I am speaking with the correct person using two identifiers.   I discussed the limitations, risks, security and privacy concerns of performing an evaluation and management service by telephone and the availability of in person appointments. I also discussed with the patient that there may be a patient responsible charge related to this service. The patient expressed understanding and agreed to proceed.   Subjective:    CC: 6 mo f/u  HPI:  Hypertension- Pt denies chest pain, SOB, dizziness, or heart palpitations.  Taking meds as directed w/o problems.  Denies medication side effects. Has some occ swelling in her ankles at the end of the day.    F/U BrCa - she is scheduled for right breast lumpectomy on May 12 with Rolm Bookbinder. She has a pre-op on Thursday and EKG.  She is a little nervous.    F/U Abd aortic aneurysm - no abdominal pain. BP well controlled.    A few months ago she hears an "echo" in her R ear only. She said that it only happens when she turns her head in either direction. This only last for a few seconds. She denies any pain. The sound is very soft. She had backed off her fluticasone bc of some nosebleeds. Notices more if moves head quickly vs slowly.  No pain or drainage.    Past medical history, Surgical history, Family history not pertinant except as noted below, Social history, Allergies, and medications have been entered into the medical record, reviewed, and corrections made.   Review of Systems: No fevers, chills, night sweats, weight loss, chest pain, or shortness of breath.   Objective:    General: Speaking clearly in complete sentences without any shortness of breath.  Alert and oriented x3.  Normal judgment. No apparent acute distress.    Impression and Recommendations:   HTN - Well controlled. Continue current regimen. Follow up  in  6 months   Abd aortic aneurysm - Plan repeat US in 12/2019.    Breast cancer - on tamoxifen.  She has surgery schedule next week.   Right ear- abnormal sounds.  Could be fluid or allergies. OK to restart Flonase and see if goes away.  We can check her carotids when she comes in next time.      I discussed the assessment and treatment plan with the patient. The patient was provided an opportunity to ask questions and all were answered. The patient agreed with the plan and demonstrated an understanding of the instructions.   The patient was advised to call back or seek an in-person evaluation if the symptoms worsen or if the condition fails to improve as anticipated.  I provided 22 minutes of non-face-to-face time during this encounter.   Beatrice Lecher, MD

## 2018-06-07 ENCOUNTER — Other Ambulatory Visit (HOSPITAL_COMMUNITY)
Admission: RE | Admit: 2018-06-07 | Discharge: 2018-06-07 | Disposition: A | Discharge status: Home / Self Care | Payer: Medicare Other | Source: Ambulatory Visit | Attending: General Surgery | Admitting: General Surgery

## 2018-06-07 ENCOUNTER — Other Ambulatory Visit: Payer: Self-pay

## 2018-06-07 ENCOUNTER — Encounter (HOSPITAL_BASED_OUTPATIENT_CLINIC_OR_DEPARTMENT_OTHER)
Admission: RE | Admit: 2018-06-07 | Discharge: 2018-06-07 | Disposition: A | Discharge status: Home / Self Care | Payer: Medicare Other | Source: Ambulatory Visit | Attending: General Surgery | Admitting: General Surgery

## 2018-06-07 ENCOUNTER — Other Ambulatory Visit: Payer: Self-pay | Admitting: General Surgery

## 2018-06-07 DIAGNOSIS — Z1159 Encounter for screening for other viral diseases: Secondary | ICD-10-CM | POA: Diagnosis not present

## 2018-06-07 DIAGNOSIS — Z01810 Encounter for preprocedural cardiovascular examination: Secondary | ICD-10-CM | POA: Insufficient documentation

## 2018-06-07 DIAGNOSIS — R001 Bradycardia, unspecified: Secondary | ICD-10-CM | POA: Diagnosis not present

## 2018-06-07 MED ORDER — ENSURE PRE-SURGERY PO LIQD: 296.0000 mL | Freq: Once | ORAL | Status: DC

## 2018-06-07 NOTE — Progress Notes (Signed)
Ensure pre surgery drink given with instructions, pt verbalized understanding.

## 2018-06-08 LAB — NOVEL CORONAVIRUS, NAA (HOSP ORDER, SEND-OUT TO REF LAB; TAT 18-24 HRS): SARS-CoV-2, NAA: NOT DETECTED

## 2018-06-11 DIAGNOSIS — C50411 Malignant neoplasm of upper-outer quadrant of right female breast: Secondary | ICD-10-CM | POA: Diagnosis not present

## 2018-06-12 ENCOUNTER — Encounter (HOSPITAL_BASED_OUTPATIENT_CLINIC_OR_DEPARTMENT_OTHER): Payer: Self-pay | Admitting: Anesthesiology

## 2018-06-12 ENCOUNTER — Ambulatory Visit (HOSPITAL_BASED_OUTPATIENT_CLINIC_OR_DEPARTMENT_OTHER): Payer: Medicare Other | Admitting: Anesthesiology

## 2018-06-12 ENCOUNTER — Encounter (HOSPITAL_BASED_OUTPATIENT_CLINIC_OR_DEPARTMENT_OTHER)
Admission: RE | Disposition: A | Discharge status: Home / Self Care | Payer: Self-pay | Source: Home / Self Care | Attending: General Surgery

## 2018-06-12 ENCOUNTER — Ambulatory Visit (HOSPITAL_BASED_OUTPATIENT_CLINIC_OR_DEPARTMENT_OTHER)
Admission: RE | Admit: 2018-06-12 | Discharge: 2018-06-12 | Disposition: A | Discharge status: Home / Self Care | Payer: Medicare Other | Attending: General Surgery | Admitting: General Surgery

## 2018-06-12 DIAGNOSIS — Z8673 Personal history of transient ischemic attack (TIA), and cerebral infarction without residual deficits: Secondary | ICD-10-CM | POA: Insufficient documentation

## 2018-06-12 DIAGNOSIS — Z8542 Personal history of malignant neoplasm of other parts of uterus: Secondary | ICD-10-CM | POA: Diagnosis not present

## 2018-06-12 DIAGNOSIS — Z85828 Personal history of other malignant neoplasm of skin: Secondary | ICD-10-CM | POA: Diagnosis not present

## 2018-06-12 DIAGNOSIS — C50911 Malignant neoplasm of unspecified site of right female breast: Secondary | ICD-10-CM | POA: Diagnosis not present

## 2018-06-12 DIAGNOSIS — Z79811 Long term (current) use of aromatase inhibitors: Secondary | ICD-10-CM | POA: Insufficient documentation

## 2018-06-12 DIAGNOSIS — Z7902 Long term (current) use of antithrombotics/antiplatelets: Secondary | ICD-10-CM | POA: Insufficient documentation

## 2018-06-12 DIAGNOSIS — Z853 Personal history of malignant neoplasm of breast: Secondary | ICD-10-CM | POA: Diagnosis not present

## 2018-06-12 DIAGNOSIS — Z170 Estrogen receptor positive status [ER+]: Secondary | ICD-10-CM | POA: Diagnosis not present

## 2018-06-12 DIAGNOSIS — I10 Essential (primary) hypertension: Secondary | ICD-10-CM | POA: Diagnosis not present

## 2018-06-12 DIAGNOSIS — Z803 Family history of malignant neoplasm of breast: Secondary | ICD-10-CM | POA: Insufficient documentation

## 2018-06-12 DIAGNOSIS — Z87891 Personal history of nicotine dependence: Secondary | ICD-10-CM | POA: Diagnosis not present

## 2018-06-12 DIAGNOSIS — Z888 Allergy status to other drugs, medicaments and biological substances status: Secondary | ICD-10-CM | POA: Diagnosis not present

## 2018-06-12 DIAGNOSIS — Z923 Personal history of irradiation: Secondary | ICD-10-CM | POA: Insufficient documentation

## 2018-06-12 DIAGNOSIS — C50411 Malignant neoplasm of upper-outer quadrant of right female breast: Secondary | ICD-10-CM | POA: Diagnosis not present

## 2018-06-12 DIAGNOSIS — D509 Iron deficiency anemia, unspecified: Secondary | ICD-10-CM | POA: Diagnosis not present

## 2018-06-12 DIAGNOSIS — C50912 Malignant neoplasm of unspecified site of left female breast: Secondary | ICD-10-CM | POA: Diagnosis not present

## 2018-06-12 HISTORY — PX: BREAST LUMPECTOMY WITH RADIOACTIVE SEED LOCALIZATION: SHX6424

## 2018-06-12 SURGERY — BREAST LUMPECTOMY WITH RADIOACTIVE SEED LOCALIZATION
Anesthesia: General | Site: Breast | Laterality: Right

## 2018-06-12 MED ORDER — ONDANSETRON HCL 4 MG/2ML IJ SOLN
INTRAMUSCULAR | Status: AC
  Filled 2018-06-12: qty 2

## 2018-06-12 MED ORDER — OXYCODONE HCL 5 MG PO TABS: 5.0000 mg | ORAL_TABLET | Freq: Four times a day (QID) | ORAL | 0 refills | Status: DC | PRN

## 2018-06-12 MED ORDER — FENTANYL CITRATE (PF) 100 MCG/2ML IJ SOLN
50.0000 ug | INTRAMUSCULAR | Status: DC | PRN
  Administered 2018-06-12: 50 ug via INTRAVENOUS

## 2018-06-12 MED ORDER — PHENYLEPHRINE 40 MCG/ML (10ML) SYRINGE FOR IV PUSH (FOR BLOOD PRESSURE SUPPORT)
PREFILLED_SYRINGE | INTRAVENOUS | Status: AC
  Filled 2018-06-12: qty 10

## 2018-06-12 MED ORDER — PROPOFOL 10 MG/ML IV BOLUS
INTRAVENOUS | Status: AC
  Filled 2018-06-12: qty 20

## 2018-06-12 MED ORDER — ACETAMINOPHEN 500 MG PO TABS
ORAL_TABLET | ORAL | Status: AC
  Filled 2018-06-12: qty 2

## 2018-06-12 MED ORDER — GABAPENTIN 100 MG PO CAPS
ORAL_CAPSULE | ORAL | Status: AC
  Filled 2018-06-12: qty 1

## 2018-06-12 MED ORDER — SCOPOLAMINE 1 MG/3DAYS TD PT72: 1.0000 | MEDICATED_PATCH | Freq: Once | TRANSDERMAL | Status: DC | PRN

## 2018-06-12 MED ORDER — FENTANYL CITRATE (PF) 100 MCG/2ML IJ SOLN
INTRAMUSCULAR | Status: AC
  Filled 2018-06-12: qty 2

## 2018-06-12 MED ORDER — DEXAMETHASONE SODIUM PHOSPHATE 10 MG/ML IJ SOLN
INTRAMUSCULAR | Status: AC
  Filled 2018-06-12: qty 1

## 2018-06-12 MED ORDER — SUCCINYLCHOLINE CHLORIDE 200 MG/10ML IV SOSY
PREFILLED_SYRINGE | INTRAVENOUS | Status: DC | PRN
  Administered 2018-06-12: 100 mg via INTRAVENOUS

## 2018-06-12 MED ORDER — ONDANSETRON HCL 4 MG/2ML IJ SOLN
INTRAMUSCULAR | Status: DC | PRN
  Administered 2018-06-12: 4 mg via INTRAVENOUS

## 2018-06-12 MED ORDER — FENTANYL CITRATE (PF) 100 MCG/2ML IJ SOLN
25.0000 ug | INTRAMUSCULAR | Status: DC | PRN
  Administered 2018-06-12: 25 ug via INTRAVENOUS

## 2018-06-12 MED ORDER — GABAPENTIN 100 MG PO CAPS
100.0000 mg | ORAL_CAPSULE | ORAL | Status: AC
  Administered 2018-06-12: 13:00:00 100 mg via ORAL

## 2018-06-12 MED ORDER — ACETAMINOPHEN 500 MG PO TABS
1000.0000 mg | ORAL_TABLET | ORAL | Status: AC
  Administered 2018-06-12: 500 mg via ORAL

## 2018-06-12 MED ORDER — MIDAZOLAM HCL 2 MG/2ML IJ SOLN: 1.0000 mg | INTRAMUSCULAR | Status: DC | PRN

## 2018-06-12 MED ORDER — PHENYLEPHRINE 40 MCG/ML (10ML) SYRINGE FOR IV PUSH (FOR BLOOD PRESSURE SUPPORT)
PREFILLED_SYRINGE | INTRAVENOUS | Status: DC | PRN
  Administered 2018-06-12 (×2): 80 ug via INTRAVENOUS

## 2018-06-12 MED ORDER — SUCCINYLCHOLINE CHLORIDE 200 MG/10ML IV SOSY
PREFILLED_SYRINGE | INTRAVENOUS | Status: AC
  Filled 2018-06-12: qty 10

## 2018-06-12 MED ORDER — CEFAZOLIN SODIUM-DEXTROSE 2-4 GM/100ML-% IV SOLN
2.0000 g | INTRAVENOUS | Status: AC
  Administered 2018-06-12: 14:00:00 2 g via INTRAVENOUS

## 2018-06-12 MED ORDER — PROPOFOL 10 MG/ML IV BOLUS
INTRAVENOUS | Status: DC | PRN
  Administered 2018-06-12 (×2): 100 mg via INTRAVENOUS
  Administered 2018-06-12: 50 mg via INTRAVENOUS

## 2018-06-12 MED ORDER — LACTATED RINGERS IV SOLN
INTRAVENOUS | Status: DC
  Administered 2018-06-12 (×2): via INTRAVENOUS

## 2018-06-12 MED ORDER — DEXAMETHASONE SODIUM PHOSPHATE 4 MG/ML IJ SOLN
INTRAMUSCULAR | Status: DC | PRN
  Administered 2018-06-12: 10 mg via INTRAVENOUS

## 2018-06-12 MED ORDER — OXYCODONE HCL 5 MG PO TABS
5.0000 mg | ORAL_TABLET | Freq: Once | ORAL | Status: AC | PRN
  Administered 2018-06-12: 5 mg via ORAL

## 2018-06-12 MED ORDER — GLYCOPYRROLATE 0.2 MG/ML IJ SOLN
INTRAMUSCULAR | Status: DC | PRN
  Administered 2018-06-12: 0.2 mg via INTRAVENOUS

## 2018-06-12 MED ORDER — LIDOCAINE 2% (20 MG/ML) 5 ML SYRINGE
INTRAMUSCULAR | Status: DC | PRN
  Administered 2018-06-12: 60 mg via INTRAVENOUS

## 2018-06-12 MED ORDER — GLYCOPYRROLATE PF 0.2 MG/ML IJ SOSY
PREFILLED_SYRINGE | INTRAMUSCULAR | Status: AC
  Filled 2018-06-12: qty 1

## 2018-06-12 MED ORDER — OXYCODONE HCL 5 MG/5ML PO SOLN: 5.0000 mg | Freq: Once | ORAL | Status: AC | PRN

## 2018-06-12 MED ORDER — CEFAZOLIN SODIUM-DEXTROSE 2-4 GM/100ML-% IV SOLN
INTRAVENOUS | Status: AC
  Filled 2018-06-12: qty 100

## 2018-06-12 MED ORDER — OXYCODONE HCL 5 MG PO TABS
ORAL_TABLET | ORAL | Status: AC
  Filled 2018-06-12: qty 1

## 2018-06-12 MED ORDER — BUPIVACAINE HCL (PF) 0.25 % IJ SOLN
INTRAMUSCULAR | Status: DC | PRN
  Administered 2018-06-12: 3 mL

## 2018-06-12 MED ORDER — ONDANSETRON HCL 4 MG/2ML IJ SOLN: 4.0000 mg | Freq: Once | INTRAMUSCULAR | Status: DC | PRN

## 2018-06-12 SURGICAL SUPPLY — 61 items
ADH SKN CLS APL DERMABOND .7 (GAUZE/BANDAGES/DRESSINGS) ×1
APL PRP STRL LF DISP 70% ISPRP (MISCELLANEOUS) ×1
APPLIER CLIP 9.375 MED OPEN (MISCELLANEOUS)
APR CLP MED 9.3 20 MLT OPN (MISCELLANEOUS)
BINDER BREAST LRG (GAUZE/BANDAGES/DRESSINGS) IMPLANT
BINDER BREAST MEDIUM (GAUZE/BANDAGES/DRESSINGS) IMPLANT
BINDER BREAST XLRG (GAUZE/BANDAGES/DRESSINGS) IMPLANT
BINDER BREAST XXLRG (GAUZE/BANDAGES/DRESSINGS) IMPLANT
BLADE SURG 15 STRL LF DISP TIS (BLADE) ×1 IMPLANT
BLADE SURG 15 STRL SS (BLADE) ×3
CANISTER SUC SOCK COL 7IN (MISCELLANEOUS) IMPLANT
CANISTER SUCT 1200ML W/VALVE (MISCELLANEOUS) IMPLANT
CHLORAPREP W/TINT 26 (MISCELLANEOUS) ×3 IMPLANT
CLIP APPLIE 9.375 MED OPEN (MISCELLANEOUS) IMPLANT
CLIP VESOCCLUDE SM WIDE 6/CT (CLIP) IMPLANT
CLOSURE WOUND 1/2 X4 (GAUZE/BANDAGES/DRESSINGS) ×1
COVER BACK TABLE REUSABLE LG (DRAPES) ×3 IMPLANT
COVER MAYO STAND REUSABLE (DRAPES) ×3 IMPLANT
COVER PROBE W GEL 5X96 (DRAPES) ×3 IMPLANT
COVER WAND RF STERILE (DRAPES) IMPLANT
DECANTER SPIKE VIAL GLASS SM (MISCELLANEOUS) IMPLANT
DERMABOND ADVANCED (GAUZE/BANDAGES/DRESSINGS) ×2
DERMABOND ADVANCED .7 DNX12 (GAUZE/BANDAGES/DRESSINGS) ×1 IMPLANT
DRAPE LAPAROSCOPIC ABDOMINAL (DRAPES) ×3 IMPLANT
DRAPE UTILITY XL STRL (DRAPES) ×3 IMPLANT
DRSG TEGADERM 4X4.75 (GAUZE/BANDAGES/DRESSINGS) IMPLANT
ELECT COATED BLADE 2.86 ST (ELECTRODE) ×3 IMPLANT
ELECT REM PT RETURN 9FT ADLT (ELECTROSURGICAL) ×3
ELECTRODE REM PT RTRN 9FT ADLT (ELECTROSURGICAL) ×1 IMPLANT
GAUZE SPONGE 4X4 12PLY STRL LF (GAUZE/BANDAGES/DRESSINGS) IMPLANT
GLOVE BIO SURGEON STRL SZ7 (GLOVE) ×6 IMPLANT
GLOVE BIOGEL PI IND STRL 7.5 (GLOVE) ×1 IMPLANT
GLOVE BIOGEL PI INDICATOR 7.5 (GLOVE) ×2
GOWN STRL REUS W/ TWL LRG LVL3 (GOWN DISPOSABLE) ×2 IMPLANT
GOWN STRL REUS W/TWL LRG LVL3 (GOWN DISPOSABLE) ×6
HEMOSTAT ARISTA ABSORB 3G PWDR (HEMOSTASIS) IMPLANT
ILLUMINATOR WAVEGUIDE N/F (MISCELLANEOUS) IMPLANT
KIT MARKER MARGIN INK (KITS) ×3 IMPLANT
LIGHT WAVEGUIDE WIDE FLAT (MISCELLANEOUS) IMPLANT
NDL HYPO 25X1 1.5 SAFETY (NEEDLE) ×1 IMPLANT
NEEDLE HYPO 25X1 1.5 SAFETY (NEEDLE) ×3 IMPLANT
NS IRRIG 1000ML POUR BTL (IV SOLUTION) IMPLANT
PACK BASIN DAY SURGERY FS (CUSTOM PROCEDURE TRAY) ×3 IMPLANT
PENCIL BUTTON HOLSTER BLD 10FT (ELECTRODE) ×3 IMPLANT
SLEEVE SCD COMPRESS KNEE MED (MISCELLANEOUS) ×3 IMPLANT
SPONGE LAP 4X18 RFD (DISPOSABLE) ×3 IMPLANT
STRIP CLOSURE SKIN 1/2X4 (GAUZE/BANDAGES/DRESSINGS) ×2 IMPLANT
SUT MNCRL AB 4-0 PS2 18 (SUTURE) ×3 IMPLANT
SUT MON AB 5-0 PS2 18 (SUTURE) IMPLANT
SUT SILK 2 0 SH (SUTURE) IMPLANT
SUT VIC AB 2-0 SH 27 (SUTURE) ×3
SUT VIC AB 2-0 SH 27XBRD (SUTURE) ×1 IMPLANT
SUT VIC AB 3-0 SH 27 (SUTURE) ×3
SUT VIC AB 3-0 SH 27X BRD (SUTURE) ×1 IMPLANT
SUT VIC AB 5-0 PS2 18 (SUTURE) IMPLANT
SYR CONTROL 10ML LL (SYRINGE) ×3 IMPLANT
TOWEL GREEN STERILE FF (TOWEL DISPOSABLE) ×3 IMPLANT
TRAY FAXITRON CT DISP (TRAY / TRAY PROCEDURE) ×3 IMPLANT
TUBE CONNECTING 20'X1/4 (TUBING)
TUBE CONNECTING 20X1/4 (TUBING) IMPLANT
YANKAUER SUCT BULB TIP NO VENT (SUCTIONS) IMPLANT

## 2018-06-12 NOTE — Interval H&P Note (Signed)
History and Physical Interval Note:  06/12/2018 1:28 PM  Michelle [name]  has presented today for surgery, with the diagnosis of RIGHT BREAST CANCER.  The various methods of treatment have been discussed with the patient and family. After consideration of risks, benefits and other options for treatment, the patient has consented to  Procedure(s): RIGHT BREAST LUMPECTOMY WITH RADIOACTIVE SEED LOCALIZATION (Right) as a surgical intervention.  The patient's history has been reviewed, patient examined, no change in status, stable for surgery.  I have reviewed the patient's chart and labs.  Questions were answered to the patient's satisfaction.     Rolm Bookbinder

## 2018-06-12 NOTE — Op Note (Signed)
Preoperative diagnosis: Recurrent clinical stage I right breast cancer Postoperative diagnosis: Same as above Procedure: Right breast radioactive seed guided lumpectomy Surgeon: Dr. Serita Grammes Anesthesia: General Estimated blood loss: 10 cc Specimens: 1.  Right breast tissue marked with paint containing radioactive seed and clip 2.  Additional posterior and lateral margins marked short superior, long lateral, double deep Complications: None Drains: None Special count was correct at completion Decision to recovery stable  Indications: This is an 85 year old female who is got history of a clinical stage I right breast cancer treated with lumpectomy, sentinel lymph node biopsy, and MammoSite radiation.  She now has a mass at the site of her old scar.  This is about a 1.5 cm mass.  We discussed all the options and have elected to proceed with a lumpectomy alone.  She is hormone receptor positive as well.  Procedure: After informed consent was obtained she first was taken for seed placement.  Her case was delayed a little bit and I maintained her on antiestrogens due to the current pandemic.  We then decided proceed to the operating room.  She was given antibiotics.  SCDs were in place.  She was placed under general anesthesia without complication.  She was prepped and draped in the standard sterile surgical fashion.  Surgical timeout was then performed.  I identified the radioactive seed near her old scar as well as the MammoSite cavity.  I then made a periareolar incision in order to hide the scar later.  I infiltrated Marcaine throughout the region.  I then used cautery to remove the seed and the surrounding tissue with an attempt to get a clear margin around this.  I did take additional lateral and posterior margins as I thought these might be close.  3D mammography confirmed removal of the seed, clip and what appeared to be clear margins.  I then obtained hemostasis.  I closed the breast tissue  with 2-0 Vicryl.  The skin was closed with 3-0 Vicryl and 5-0 Monocryl.  Glue was placed.  She tolerated this well was extubated and transferred to recovery stable.

## 2018-06-12 NOTE — Transfer of Care (Signed)
Immediate Anesthesia Transfer of Care Note  Patient: Michelle [name]  Procedure(s) Performed: RIGHT BREAST LUMPECTOMY WITH RADIOACTIVE SEED LOCALIZATION (Right Breast)  Patient Location: PACU  Anesthesia Type:General  Level of Consciousness: awake and alert   Airway & Oxygen Therapy: Patient Spontanous Breathing and Patient connected to face mask oxygen  Post-op Assessment: Report given to RN and Post -op Vital signs reviewed and stable  Post vital signs: Reviewed and stable  Last Vitals:  Vitals Value Taken Time  BP    Temp    Pulse    Resp    SpO2      Last Pain:  Vitals:   06/12/18 1316  TempSrc: Oral  PainSc: 0-No pain         Complications: No apparent anesthesia complications

## 2018-06-12 NOTE — Anesthesia Preprocedure Evaluation (Addendum)
Anesthesia Evaluation  Patient identified by MRN, date of birth, ID band Patient awake    Reviewed: Allergy & Precautions, NPO status , Patient's Chart, lab work & pertinent test results  History of Anesthesia Complications (+) PONV and history of anesthetic complications  Airway Mallampati: II  TM Distance: >3 FB Neck ROM: Full    Dental  (+) Dental Advisory Given   Pulmonary former smoker,    breath sounds clear to auscultation       Cardiovascular hypertension, Pt. on medications  Rhythm:Regular Rate:Normal   AAA (2.8cm x 2.8cm in 2016)    Neuro/Psych PSYCHIATRIC DISORDERS Anxiety  Neuromuscular disease (Bell's palsy) CVA, Residual Symptoms    GI/Hepatic Neg liver ROS, hiatal hernia, GERD  Controlled,  Endo/Other   Obesity   Renal/GU negative Renal ROS     Musculoskeletal  (+) Arthritis ,   Abdominal   Peds  Hematology negative hematology ROS (+) anemia ,   Anesthesia Other Findings   Reproductive/Obstetrics  Breast cancer                             Anesthesia Physical Anesthesia Plan  ASA: III  Anesthesia Plan: General   Post-op Pain Management:    Induction: Intravenous  PONV Risk Score and Plan: 4 or greater and Treatment may vary due to age or medical condition, Ondansetron and Propofol infusion  Airway Management Planned: LMA  Additional Equipment: None  Intra-op Plan:   Post-operative Plan: Extubation in OR  Informed Consent: I have reviewed the patients History and Physical, chart, labs and discussed the procedure including the risks, benefits and alternatives for the proposed anesthesia with the patient or authorized representative who has indicated his/her understanding and acceptance.     Dental advisory given  Plan Discussed with: CRNA and Anesthesiologist  Anesthesia Plan Comments:        Anesthesia Quick Evaluation

## 2018-06-12 NOTE — Anesthesia Postprocedure Evaluation (Signed)
Anesthesia Post Note  Patient: Michelle [name]  Procedure(s) Performed: RIGHT BREAST LUMPECTOMY WITH RADIOACTIVE SEED LOCALIZATION (Right Breast)     Patient location during evaluation: PACU Anesthesia Type: General Level of consciousness: awake and alert Pain management: pain level controlled Vital Signs Assessment: post-procedure vital signs reviewed and stable Respiratory status: spontaneous breathing, nonlabored ventilation and respiratory function stable Cardiovascular status: blood pressure returned to baseline and stable Postop Assessment: no apparent nausea or vomiting Anesthetic complications: no    Last Vitals:  Vitals:   06/12/18 1515 06/12/18 1530  BP: (!) 156/70 (!) 159/74  Pulse: (!) 56 (!) 58  Resp: 11 13  Temp:    SpO2: 100% 100%    Last Pain:  Vitals:   06/12/18 1530  TempSrc:   PainSc: [place]

## 2018-06-12 NOTE — H&P (Addendum)
Michelle [name] is an 85 y.o. female.   Chief Complaint: breast cancer HPI:  45 yof referred by Dr Emmit Pomfret for prior breast cancer. she was treated in 2013 with lump/sn and a mammosite. her path was idc grade I measuring .52 cm with negative margins, 2 sn negative. it was 100% er and pr pos, her 2 negative with ki of 10%. she declined antiestrogens at the time as she was concerned about side effects. she has been treated for endometrial cancer in the interim as well as an scc of her eyelid. she otherwise is doing well and remains on plavix. she had her mm delayed due to scc of eyelid. she then had mm with stable mammosite cavity as well as she had a possible 2 cm mass noted in right breast. there is benign dystrophic calcification. on [place] thre is a 1.7 cm mass present at the areolar margin with a single normal node seen in right axilla. US guided biopsy done that shows grade II IDC that is 100% er/pr pos with low Ki67 of 10%. it is her 2 negative. we started arimidex due to covid. she notes no change. she is tolerating well. due to situation at hospital we should be ok to proceed in mid may at this point.   Past Medical History:  Diagnosis Date  . AAA (abdominal aortic aneurysm) ([place]) 10/2013  . Anxiety   . Basal cell carcinoma 2011   R back  . Bell's palsy 1984  . Bradycardia   . Breast cancer ([place]) 02/2011   US-guided biopsy  . Cataract   . Cholecystitis 2004  . Complication of anesthesia   . Endometrial cancer ([place]) 05/2013   uterine ca  . GERD (gastroesophageal reflux disease)   . Hiatal hernia 2008  . History of breast cancer   . History of cerebrovascular accident 2009  . Hyperlipidemia   . Hypertension 2012  . Macular degeneration   . Polyp of colon 2008, 2014   Benign  . PONV (postoperative nausea and vomiting)   . Shingles 2008  . Squamous cell carcinoma    2010-2015  . Squamous cell skin cancer, multiple sites 2014   recurrent  . Stroke ([place]) 09/2007   Slight  memory problems residual  . Tendonitis    rt hand  . Vocal cord polyp     Past Surgical History:  Procedure Laterality Date  . APPENDECTOMY    . BASAL CELL CARCINOMA EXCISION    . biospy  10/28/09   Shave Biospy skin Left hand(Acitinic Keratoses), Right Upper Arm (superficial Basal Cell), Right Upper Back(Superficial Basal Cell), Left Neck ( Solar Lentigo & Seborrheic Keratoses)  . BREAST BIOPSY  02/11/11   Right Breast Needle Core Biospy - Upper Outer Quadrant; ER/PR 100%, Her-2 Neu neg.; Ki-67 10%  . BREAST MAMMOSITE  03/14/2011   Procedure: MAMMOSITE BREAST;  Surgeon: Rolm Bookbinder, MD;  Location: [facility];  Service: General;  Laterality: Right;  . BREAST SURGERY     right breast lumpectomy snbx  . CHOLECYSTECTOMY  2004  . COLONOSCOPY    . COLONOSCOPY W/ POLYPECTOMY     2008, 2014 (benign)  . COLONOSCOPY WITH PROPOFOL N/A 07/09/2015   Procedure: COLONOSCOPY WITH PROPOFOL;  Surgeon: Wonda Horner, MD;  Location: [facility] ENDOSCOPY;  Service: Endoscopy;  Laterality: N/A;  . DILATION AND CURETTAGE OF UTERUS N/A 04/24/2013   Procedure: DILATATION AND CURETTAGE;  Surgeon: Guss Bunde, MD;  Location: [facility];  Service: Gynecology;  Laterality: N/A;  . ESOPHAGOGASTRODUODENOSCOPY N/A 07/06/2015   Procedure: ESOPHAGOGASTRODUODENOSCOPY (EGD);  Surgeon: Wilford Corner, MD;  Location: [facility] ENDOSCOPY;  Service: Endoscopy;  Laterality: N/A;  . HYSTEROSCOPY N/A 04/24/2013   Procedure: HYSTEROSCOPY;  Surgeon: Guss Bunde, MD;  Location: [facility];  Service: Gynecology;  Laterality: N/A;  . ROBOTIC ASSISTED TOTAL HYSTERECTOMY WITH BILATERAL SALPINGO OOPHERECTOMY Bilateral 06/11/2013   Procedure: ROBOTIC ASSISTED TOTAL HYSTERECTOMY WITH BILATERAL SALPINGO OOPHORECTOMY WITH POSSIBLE STAGING, EPISIOTOMY;  Surgeon: Janie Morning, MD;  Location: [facility];  Service: Gynecology;  Laterality: Bilateral;  . SQUAMOUS CELL CARCINOMA EXCISION    . TEAR DUCT PROBING    . TONSILLECTOMY    . vocal cord  poylps  1969   excision    Family History  Problem Relation Age of Onset  . Heart disease Father   . Cancer Sister 49       Lymphoma  . Breast cancer Sister   . Cancer Paternal Aunt        Breast cancer  . Cancer Daughter 76       Bilateral Breast Cancer  . Breast cancer Daughter 77  . Cancer Daughter        Breast Cancer  . Breast cancer Daughter   . Cancer Sister        2005 - Renal Cell Cancer and Breast Cancer  . Breast cancer Sister    Social History:  reports that she quit smoking about 22 years ago. She has a 30.00 pack-year smoking history. She has never used smokeless tobacco. She reports current alcohol use of about 1.0 standard drinks of alcohol per week. She reports that she does not use drugs.  Allergies:  Allergies  Allergen Reactions  . Benadryl [Diphenhydramine Hcl]     Hyper  . Doxycycline Other (See Comments)    acid reflux  . Simvastatin Other (See Comments)    REACTION: memory loss  . Fosamax [Alendronate Sodium] Other (See Comments)    GERD  . Lisinopril Other (See Comments)    Dry cough  . Nsaids Other (See Comments)    GI Bleed    No medications prior to admission.    No results found for this or any previous visit (from the past 48 hour(s)). No results found.  ROS General Not Present- Appetite Loss, Chills, Fatigue, Fever, Night Sweats, Weight Gain and Weight Loss. Skin Present- Dryness. Not Present- Change in Wart/Mole, Hives, Jaundice, New Lesions, Non-Healing Wounds, Rash and Ulcer. HEENT Present- Wears glasses/contact lenses. Not Present- Earache, Hearing Loss, Hoarseness, Nose Bleed, Oral Ulcers, Ringing in the Ears, Seasonal Allergies, Sinus Pain, Sore Throat, Visual Disturbances and Yellow Eyes. Breast Present- Breast Mass. Not Present- Breast Pain, Nipple Discharge and Skin Changes. Cardiovascular Present- Swelling of Extremities. Not Present- Chest Pain, Difficulty Breathing Lying Down, Leg Cramps, Palpitations, Rapid Heart Rate and  Shortness of Breath. Gastrointestinal Present- Constipation and Indigestion. Not Present- Abdominal Pain, Bloating, Bloody Stool, Change in Bowel Habits, Chronic diarrhea, Difficulty Swallowing, Excessive gas, Gets full quickly at meals, Hemorrhoids, Nausea, Rectal Pain and Vomiting. Female Genitourinary Not Present- Frequency, Nocturia, Painful Urination, Pelvic Pain and Urgency. Musculoskeletal Present- Back Pain and Joint Pain. Not Present- Joint Stiffness, Muscle Pain, Muscle Weakness and Swelling of Extremities. Neurological Not Present- Decreased Memory, Fainting, Headaches, Numbness, Seizures, Tingling, Tremor, Trouble walking and Weakness. Psychiatric Present- Change in Sleep Pattern. Not Present- Anxiety, Bipolar, Depression, Fearful and Frequent crying. Endocrine Present- Cold Intolerance. Not Present- Excessive Hunger, Hair Changes, Heat Intolerance, Hot flashes and New  Diabetes. Hematology Present- Blood Thinners and Easy Bruising. Not Present- Excessive bleeding, Gland problems, HIV and Persistent Infections. Height 5' 7"  (1.702 m), weight 84 kg. Physical Exam  General Mental Status-Alert. Head and Neck Trachea-midline. Thyroid Gland Characteristics - normal size and consistency. Eye Sclera/Conjunctiva - Bilateral-No scleral icterus. Chest and Lung Exam Chest and lung exam reveals -quiet, even and easy respiratory effort with no use of accessory muscles and on auscultation, normal breath sounds, no adventitious sounds and normal vocal resonance. Breast Nipples-No Discharge. Note: mammosite cavity present about 3-4 cm area lateral aspect of scar with 1.5 cm mass present Cardiovascular Cardiovascular examination reveals -normal heart sounds, regular rate and rhythm with no murmurs. Abdomen Note: soft no hm Neurologic Neurologic evaluation reveals -alert and oriented x 3 with no impairment of recent or remote memory. Lymphatic Head & Neck General Head & Neck  Lymphatics: Bilateral - Description - Normal. Axillary General Axillary Region: Bilateral - Description - Normal. Note: no [place] adenopathy  Assessment/Plan RECURRENT BREAST CANCER, RIGHT (C50.911) Story: right breast lumpectomy in mid may will stop plavix 7 days prior I dont think she needs sn biopsy given age, tumor, size. I also think that she could get another lumpectomy vs a mastectomy. she may very well not need any radiotherapy as long as she does antiestrogen therapy postop. We discussed the options for treatment of the breast cancer which included lumpectomy versus a mastectomy. We discussed the performance of the lumpectomy with radioactive seed placement. We discussed a 5-10% chance of a positive margin requiring reexcision in the operating room. We discussed mastectomy and the postoperative care for that as well. Mastectomy can be followed by reconstruction. The decision for lumpectomy vs mastectomy has no impact on decision for chemotherapy. We discussed that there is no difference in her survival whether she undergoes lumpectomy with radiation therapy or antiestrogen therapy versus a mastectomy. There is also no real difference between her recurrence in the breast. We discussed the risks of operation including bleeding, infection, possible reoperation. She understands her further therapy will be based on what her stages at the time of her operation.  Rolm Bookbinder, MD 06/12/2018, 9:50 AM

## 2018-06-12 NOTE — Discharge Instructions (Signed)
[place] Office Phone Number [phone]   POST OP INSTRUCTIONS Take 400 mg of ibuprofen every 8 hours or 650 mg tylenol every 6 hours for next 72 hours then as needed. Use ice several times daily also. Always review your discharge instruction sheet given to you by the facility where your surgery was performed.  IF YOU HAVE DISABILITY OR FAMILY LEAVE FORMS, YOU MUST BRING THEM TO THE OFFICE FOR PROCESSING.  DO NOT GIVE THEM TO YOUR DOCTOR.  1. A prescription for pain medication may be given to you upon discharge.  Take your pain medication as prescribed, if needed.  If narcotic pain medicine is not needed, then you may take acetaminophen (Tylenol), naprosyn (Alleve) or ibuprofen (Advil) as needed. 2. Take your usually prescribed medications unless otherwise directed 3. If you need a refill on your pain medication, please contact your pharmacy.  They will contact our office to request authorization.  Prescriptions will not be filled after 5pm or on week-ends. 4. You should eat very light the first 24 hours after surgery, such as soup, crackers, pudding, etc.  Resume your normal diet the day after surgery. 5. Most patients will experience some swelling and bruising in the breast.  Ice packs and a good support bra will help.  Wear the breast binder provided or a sports bra for 72 hours day and night.  After that wear a sports bra during the day until you return to the office. Swelling and bruising can take several days to resolve.  6. It is common to experience some constipation if taking pain medication after surgery.  Increasing fluid intake and taking a stool softener will usually help or prevent this problem from occurring.  A mild laxative (Milk of Magnesia or Miralax) should be taken according to package directions if there are no bowel movements after 48 hours. 7. Unless discharge instructions indicate otherwise, you may remove your bandages 48 hours after surgery and you may  shower at that time.  You may have steri-strips (small skin tapes) in place directly over the incision.  These strips should be left on the skin for 7-10 days and will come off on their own.  If your surgeon used skin glue on the incision, you may shower in 24 hours.  The glue will flake off over the next 2-3 weeks.  Any sutures or staples will be removed at the office during your follow-up visit. 8. ACTIVITIES:  You may resume regular daily activities (gradually increasing) beginning the next day.  Wearing a good support bra or sports bra minimizes pain and swelling.  You may have sexual intercourse when it is comfortable. a. You may drive when you no longer are taking prescription pain medication, you can comfortably wear a seatbelt, and you can safely maneuver your car and apply brakes. b. RETURN TO WORK:  ______________________________________________________________________________________ 9. You should see your doctor in the office for a follow-up appointment approximately two weeks after your surgery.  Your doctors nurse will typically make your follow-up appointment when she calls you with your pathology report.  Expect your pathology report 3-4 business days after your surgery.  You may call to check if you do not hear from [place] after three days. 10. OTHER INSTRUCTIONS: _______________________________________________________________________________________________ _____________________________________________________________________________________________________________________________________ _____________________________________________________________________________________________________________________________________ _____________________________________________________________________________________________________________________________________  WHEN TO CALL DR WAKEFIELD: 1. Fever over 101.0 2. Nausea and/or vomiting. 3. Extreme swelling or bruising. 4. Continued bleeding from  incision. 5. Increased pain, redness, or drainage from the incision.  The clinic staff is available  to answer your questions during regular business hours.  Please dont hesitate to call and ask to speak to one of the nurses for clinical concerns.  If you have a medical emergency, go to the nearest emergency room or call 911.  A surgeon from [facility] is always on call at the hospital.  For further questions, please visit [email or web address]   Post Anesthesia Home Care Instructions  Activity: Get plenty of rest for the remainder of the day. A responsible individual must stay with you for 24 hours following the procedure.  For the next 24 hours, DO NOT: -Drive a car -Paediatric nurse -Drink alcoholic beverages -Take any medication unless instructed by your physician -Make any legal decisions or sign important papers.  Meals: Start with liquid foods such as gelatin or soup. Progress to regular foods as tolerated. Avoid greasy, spicy, heavy foods. If nausea and/or vomiting occur, drink only clear liquids until the nausea and/or vomiting subsides. Call your physician if vomiting continues.  Special Instructions/Symptoms: Your throat may feel dry or sore from the anesthesia or the breathing tube placed in your throat during surgery. If this causes discomfort, gargle with warm salt water. The discomfort should disappear within 24 hours.  If you had a scopolamine patch placed behind your ear for the management of post- operative nausea and/or vomiting:  1. The medication in the patch is effective for 72 hours, after which it should be removed.  Wrap patch in a tissue and discard in the trash. Wash hands thoroughly with soap and water. 2. You may remove the patch earlier than 72 hours if you experience unpleasant side effects which may include dry mouth, dizziness or visual disturbances. 3. Avoid touching the patch. Wash your hands with soap and water after contact  with the patch.

## 2018-06-12 NOTE — Anesthesia Procedure Notes (Signed)
Procedure Name: Intubation Date/Time: 06/12/2018 1:50 PM Performed by: Eulas Post, Webb Weed W, CRNA Pre-anesthesia Checklist: Patient identified, Emergency Drugs available, Suction available and Patient being monitored Patient Re-evaluated:Patient Re-evaluated prior to induction Oxygen Delivery Method: Circle system utilized Preoxygenation: Pre-oxygenation with 100% oxygen Induction Type: IV induction Ventilation: Mask ventilation without difficulty Laryngoscope Size: Miller and 2 Grade View: Grade I Tube type: Oral Tube size: 7.0 mm Number of attempts: 1 Airway Equipment and Method: Stylet and Oral airway Placement Confirmation: ETT inserted through vocal cords under direct vision,  positive ETCO2 and breath sounds checked- equal and bilateral Secured at: 22 cm Tube secured with: Tape Dental Injury: Teeth and Oropharynx as per pre-operative assessment

## 2018-06-12 NOTE — Anesthesia Procedure Notes (Signed)
Procedure Name: LMA Insertion Date/Time: 06/12/2018 1:39 PM Performed by: Lieutenant Diego, CRNA Pre-anesthesia Checklist: Patient identified, Emergency Drugs available, Suction available and Patient being monitored Patient Re-evaluated:Patient Re-evaluated prior to induction Oxygen Delivery Method: Circle system utilized Preoxygenation: Pre-oxygenation with 100% oxygen Induction Type: IV induction Ventilation: Mask ventilation without difficulty LMA: LMA inserted LMA Size: 4.0 Number of attempts: 1 Placement Confirmation: positive ETCO2 and breath sounds checked- equal and bilateral Tube secured with: Tape Dental Injury: Teeth and Oropharynx as per pre-operative assessment

## 2018-06-13 ENCOUNTER — Encounter (HOSPITAL_BASED_OUTPATIENT_CLINIC_OR_DEPARTMENT_OTHER): Payer: Self-pay | Admitting: General Surgery

## 2018-06-25 ENCOUNTER — Other Ambulatory Visit: Payer: Self-pay | Admitting: Family Medicine

## 2018-06-27 ENCOUNTER — Telehealth: Payer: Self-pay | Admitting: Family Medicine

## 2018-06-27 NOTE — Telephone Encounter (Signed)
Daughter calling in wanting to know if Dr.Metheney can get access to bone density results from October 2016 and October 2018. States that they are trying to show patient the difference to try to get her to do the prolia shots. Daughter wanted to know if she can add it on Mychart so they can have access to it and print it. Please contact and advise

## 2018-06-27 NOTE — Telephone Encounter (Signed)
It looks like they were completed from an external imaging facility [facility]). We cannot add them to where they are viewable via MyChart, but we can print the reports if her daughter would like to pick them up? They can be printed from the media tab in her chart.

## 2018-06-28 NOTE — Telephone Encounter (Signed)
Called daughter and let her know information below. Have printed that information for her. She will come by around 1pm to pick it up. No further questions at this time.

## 2018-07-16 DIAGNOSIS — H353132 Nonexudative age-related macular degeneration, bilateral, intermediate dry stage: Secondary | ICD-10-CM | POA: Diagnosis not present

## 2018-07-30 ENCOUNTER — Telehealth: Payer: Self-pay | Admitting: Oncology

## 2018-07-30 DIAGNOSIS — K5901 Slow transit constipation: Secondary | ICD-10-CM | POA: Diagnosis not present

## 2018-07-30 NOTE — Telephone Encounter (Signed)
Called patient regarding upcoming Webex appointment, patient would prefer this be a telephone visit.

## 2018-07-31 ENCOUNTER — Inpatient Hospital Stay: Payer: Medicare Other | Attending: Oncology | Admitting: Oncology

## 2018-07-31 ENCOUNTER — Other Ambulatory Visit: Payer: Self-pay | Admitting: Physician Assistant

## 2018-07-31 ENCOUNTER — Other Ambulatory Visit: Payer: Self-pay | Admitting: Oncology

## 2018-07-31 ENCOUNTER — Ambulatory Visit
Admission: RE | Admit: 2018-07-31 | Discharge: 2018-07-31 | Disposition: A | Discharge status: Home / Self Care | Payer: Medicare Other | Source: Ambulatory Visit | Attending: Physician Assistant | Admitting: Physician Assistant

## 2018-07-31 DIAGNOSIS — Z79811 Long term (current) use of aromatase inhibitors: Secondary | ICD-10-CM | POA: Diagnosis not present

## 2018-07-31 DIAGNOSIS — C50411 Malignant neoplasm of upper-outer quadrant of right female breast: Secondary | ICD-10-CM | POA: Diagnosis not present

## 2018-07-31 DIAGNOSIS — Z170 Estrogen receptor positive status [ER+]: Secondary | ICD-10-CM | POA: Diagnosis not present

## 2018-07-31 DIAGNOSIS — Z87891 Personal history of nicotine dependence: Secondary | ICD-10-CM

## 2018-07-31 DIAGNOSIS — R1084 Generalized abdominal pain: Secondary | ICD-10-CM

## 2018-07-31 DIAGNOSIS — M818 Other osteoporosis without current pathological fracture: Secondary | ICD-10-CM

## 2018-07-31 DIAGNOSIS — R109 Unspecified abdominal pain: Secondary | ICD-10-CM | POA: Diagnosis not present

## 2018-07-31 NOTE — Progress Notes (Signed)
[place]  [phone] [phone]     ID: Michelle [name] DOB: 03-11-1932  MR#: [id number]  KWI#:[id number]  Patient Care Team: Hali Marry, MD as PCP - General (Family Medicine) Hali Marry, MD (Family Medicine) Rolm Bookbinder, MD as Attending Physician (Dermatology) Monna Fam, MD as Attending Physician (Ophthalmology) [name], [name], MD as Consulting Physician (Oncology) Rolm Bookbinder, MD as Consulting Physician (General Surgery) Kyung Rudd, MD as Consulting Physician (Radiation Oncology) Chauncey Cruel, MD OTHER MD:  I connected with [name] on 07/31/18 at  1:30 PM EDT by telephone visit and verified that I am speaking with the correct person using two identifiers.   I discussed the limitations, risks, security and privacy concerns of performing an evaluation and management service by telemedicine and the availability of in-person appointments. I also discussed with the patient that there may be a patient responsible charge related to this service. The patient expressed understanding and agreed to proceed.   Other persons participating in the visit and their role in the encounter: Wilburn Mylar, scribe   Patient's location: home  Provider's location: [place]    CHIEF COMPLAINT: Recurrent estrogen receptor positive breast cancer  CURRENT TREATMENT: anastrozole   INTERVAL HISTORY: Michelle [name] returns today for follow up and treatment of her recurrent breast cancer.  She continues on anastrozole with good tolerance. She denies hot flashes and any issues with vaginal dryness. She doesn't pay anything for it.  Her most recent bone density testing was on 11/01/2016 at [facility]. This showed a T-score of -1.5, which is considered osteopenic.  Since her last visit, she underwent right breast lumpectomy on 06/12/2018 under Dr. Donne Hazel. Pathology from the procedure ([id number]) revealed: invasive  ductal carcinoma, 1.8 cm, grade 2; final resection margins negative.  She underwent coronavirus testing on 06/07/2018, prior to surgery, which was negative.   REVIEW OF SYSTEMS: Michelle [name] reports she has been reading, doing housework, and cooking. She walks down her driveway and around her house, but she doesn't walk around her neighborhood due to the high levels of traffic. A detailed review of systems was otherwise entirely negative.   HISTORY OF CURRENT ILLNESS: Michelle [name] has a history of right breast invasive ductal carcinoma status-post right lumpectomy with sentinel lymph node biopsy 03/14/2011 ([id number]), for a 0.52 cm, grade 1 invasive ductal carcinoma, both sentinel lymph nodes clear; margins negative; estrogen receptor 100% positive, progesterone receptor 100% positive, both with strong staining intensity, HER2 negative, with an MIB-1 of 10%. She underwent MammoSite therapy between 03/18/2011 and 03/25/2011.  Antiestrogen therapy was discussed with the patient by my former partner Dr. Chancy Milroy on 05/19/2011, with the patient declining.  Genetics testing for the BRCA 1 and 2 gene mutations was obtained 02/28/2011 and was negative.  She had routine screening mammography on 03/06/2018 showing a possible abnormality in the right breast. She proceeded to bilateral diagnostic mammography with tomography and right breast ultrasonography at [facility] on the same day showing: a 1.7 irregular, hypoechoic mass at the 11-12 o'clock areolar margin; one normal lymph node was seen.  Accordingly on 03/14/2018 she proceeded to biopsy of the right breast area in question. The pathology ([id number]) from this procedure showed: invasive ductal carcinoma, grade 2. Prognostic indicators significant for: estrogen receptor, 100% positive and progesterone receptor, 100% positive, both with strong staining intensity. Proliferation marker Ki67 at 10%. HER2 negative (1+) by immunohistochemistry.  Of note, she also has a  personal history of uterine  cancer status post total abdominal hysterectomy and bilateral salpingo-oophorectomy 06/11/2013 for an invasive endometrioid carcinoma of the uterus, grade 1, confined within the inner half of the endometrium.  A total of 3 left pelvic and 10 right pelvic lymph nodes were all negative for a pT1a pN0 stage.   She has also had multiple abscesses for skin cancer (basal cell and squamous cell).  The patient's subsequent history is as detailed below.   PAST MEDICAL HISTORY: Past Medical History:  Diagnosis Date  . AAA (abdominal aortic aneurysm) ([place]) 10/2013  . Anxiety   . Basal cell carcinoma 2011   R back  . Bell's palsy 1984  . Bradycardia   . Breast cancer ([place]) 02/2011   US-guided biopsy  . Cataract   . Cholecystitis 2004  . Complication of anesthesia   . Endometrial cancer ([place]) 05/2013   uterine ca  . GERD (gastroesophageal reflux disease)   . Hiatal hernia 2008  . History of breast cancer   . History of cerebrovascular accident 2009  . Hyperlipidemia   . Hypertension 2012  . Macular degeneration   . Polyp of colon 2008, 2014   Benign  . PONV (postoperative nausea and vomiting)   . Shingles 2008  . Squamous cell carcinoma    2010-2015  . Squamous cell skin cancer, multiple sites 2014   recurrent  . Stroke ([place]) 09/2007   Slight memory problems residual  . Tendonitis    rt hand  . Vocal cord polyp     PAST SURGICAL HISTORY: Past Surgical History:  Procedure Laterality Date  . APPENDECTOMY    . BASAL CELL CARCINOMA EXCISION    . biospy  10/28/09   Shave Biospy skin Left hand(Acitinic Keratoses), Right Upper Arm (superficial Basal Cell), Right Upper Back(Superficial Basal Cell), Left Neck ( Solar Lentigo & Seborrheic Keratoses)  . BREAST BIOPSY  02/11/11   Right Breast Needle Core Biospy - Upper Outer Quadrant; ER/PR 100%, Her-2 Neu neg.; Ki-67 10%  . BREAST LUMPECTOMY WITH RADIOACTIVE SEED LOCALIZATION Right 06/12/2018   Procedure: RIGHT  BREAST LUMPECTOMY WITH RADIOACTIVE SEED LOCALIZATION;  Surgeon: Rolm Bookbinder, MD;  Location: [place];  Service: General;  Laterality: Right;  . BREAST MAMMOSITE  03/14/2011   Procedure: MAMMOSITE BREAST;  Surgeon: Rolm Bookbinder, MD;  Location: [place];  Service: General;  Laterality: Right;  . BREAST SURGERY     right breast lumpectomy snbx  . CHOLECYSTECTOMY  2004  . COLONOSCOPY    . COLONOSCOPY W/ POLYPECTOMY     2008, 2014 (benign)  . COLONOSCOPY WITH PROPOFOL N/A 07/09/2015   Procedure: COLONOSCOPY WITH PROPOFOL;  Surgeon: Wonda Horner, MD;  Location: [facility] ENDOSCOPY;  Service: Endoscopy;  Laterality: N/A;  . DILATION AND CURETTAGE OF UTERUS N/A 04/24/2013   Procedure: DILATATION AND CURETTAGE;  Surgeon: Guss Bunde, MD;  Location: [facility];  Service: Gynecology;  Laterality: N/A;  . ESOPHAGOGASTRODUODENOSCOPY N/A 07/06/2015   Procedure: ESOPHAGOGASTRODUODENOSCOPY (EGD);  Surgeon: Wilford Corner, MD;  Location: [facility] ENDOSCOPY;  Service: Endoscopy;  Laterality: N/A;  . HYSTEROSCOPY N/A 04/24/2013   Procedure: HYSTEROSCOPY;  Surgeon: Guss Bunde, MD;  Location: [facility];  Service: Gynecology;  Laterality: N/A;  . ROBOTIC ASSISTED TOTAL HYSTERECTOMY WITH BILATERAL SALPINGO OOPHERECTOMY Bilateral 06/11/2013   Procedure: ROBOTIC ASSISTED TOTAL HYSTERECTOMY WITH BILATERAL SALPINGO OOPHORECTOMY WITH POSSIBLE STAGING, EPISIOTOMY;  Surgeon: Janie Morning, MD;  Location: [facility];  Service: Gynecology;  Laterality: Bilateral;  . SQUAMOUS CELL CARCINOMA EXCISION    .  TEAR DUCT PROBING    . TONSILLECTOMY    . vocal cord poylps  1969   excision    FAMILY HISTORY Family History  Problem Relation Age of Onset  . Heart disease Father   . Cancer Sister 16       Lymphoma  . Breast cancer Sister   . Cancer Paternal Aunt        Breast cancer  . Cancer Daughter 35       Bilateral Breast Cancer  . Breast cancer Daughter 4  . Cancer Daughter        Breast  Cancer  . Breast cancer Daughter   . Cancer Sister        2005 - Renal Cell Cancer and Breast Cancer  . Breast cancer Sister   Patient father was 31 years old when he died from heart issues. Patient mother died from heart issues at age 1. Patient has 3 siblings, 1 brother and 2 sisters.  Patient notes a family hx of breast cancer in both sisters and both daughters. Patient's daughters have had extensive genetics evaluation with no deleterious mutation identified.    GYNECOLOGIC HISTORY:  No LMP recorded. Patient has had a hysterectomy. Menarche: 85 years old Age at first live birth: 85 years old [place] P 2 LMP 74, roughly 85 years old Contraceptive unknown HRT? No Hysterectomy? Yes, 06/11/2013 BSO? 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 She is married. Husband Mortimer Fries is a retired Airline pilot. They have 2 daughters, [name] and [place]. The patient attends [employer]. She and her husband share 2 grandchildren and 4 great-grandchildren.     ADVANCED DIRECTIVES: Husband Mikki Santee is her HCPOA.   HEALTH MAINTENANCE: Social History   Tobacco Use  . Smoking status: Former Smoker    Packs/day: 1.00    Years: 30.00    Pack years: 30.00    Quit date: 02/01/1996    Years since quitting: 22.5  . Smokeless tobacco: Never Used  Substance Use Topics  . Alcohol use: Yes    Alcohol/week: 1.0 standard drinks    Types: 1 Glasses of wine per week    Comment: Ocassionaly once a year .  Marland Kitchen Drug use: No     Colonoscopy: 07/09/2015, negative, Dr. Penelope Coop  PAP: 09/29/2014, negative  Bone density: 11/01/2016, T-score of -1.5   Allergies  Allergen Reactions  . Benadryl [Diphenhydramine Hcl]     Hyper  . Doxycycline Other (See Comments)    acid reflux  . Simvastatin Other (See Comments)    REACTION: memory loss  . Fosamax [Alendronate Sodium] Other (See Comments)    GERD  . Lisinopril Other (See Comments)    Dry cough  .  Nsaids Other (See Comments)    GI Bleed    Current Outpatient Medications  Medication Sig Dispense Refill  . acetaminophen (TYLENOL) 325 MG tablet Take 650 mg by mouth as needed for mild pain.     Marland Kitchen anastrozole (ARIMIDEX) 1 MG tablet Take 1 tablet (1 mg total) by mouth daily. 90 tablet 4  . atorvastatin (LIPITOR) 20 MG tablet TAKE 1 TABLET BY MOUTH  EVERY OTHER NIGHT. 45 tablet 3  . cholecalciferol (VITAMIN D3) 25 MCG (1000 UT) tablet Take 1,000 Units by mouth daily.    . clopidogrel (PLAVIX) 75 MG tablet TAKE 1 TABLET BY MOUTH  DAILY 90 tablet 1  . famotidine (PEPCID) 20 MG tablet  Take 1 tablet (20 mg total) by mouth 2 (two) times daily. 180 tablet 1  . fluticasone (FLONASE) 50 MCG/ACT nasal spray INSTILL 2 SPRAYS IN EACH  NOSTRIL EVERY DAY 48 g 4  . losartan (COZAAR) 25 MG tablet TAKE 1 TABLET BY MOUTH  DAILY 90 tablet 1  . Multiple Vitamins-Minerals (ICAPS AREDS 2 PO) Take by mouth.    . oxyCODONE (OXY IR/ROXICODONE) 5 MG immediate release tablet Take 1 tablet (5 mg total) by mouth every 6 (six) hours as needed for moderate pain, severe pain or breakthrough pain. 10 tablet 0  . Propylene Glycol (SYSTANE COMPLETE OP) Apply to eye.    Marland Kitchen VIACTIV [phone] MG-UNT-MCG CHEW CHEW 1 TABLET BY MOUTH EVERY MORNING.  12   No current facility-administered medications for this visit.     OBJECTIVE: Older white woman in no acute distress  There were no vitals filed for this visit.   There is no height or weight on file to calculate BMI.   Wt Readings from Last 3 Encounters:  06/12/18 182 lb 5.1 oz (82.7 kg)  06/05/18 181 lb (82.1 kg)  03/29/18 186 lb 4 oz (84.5 kg)      ECOG FS:1 - Symptomatic but completely ambulatory  LAB RESULTS:  CMP     Component Value Date/Time   NA 141 03/28/2018 1555   K 4.3 03/28/2018 1555   CL 106 03/28/2018 1555   CO2 28 03/28/2018 1555   GLUCOSE 106 (H) 03/28/2018 1555   BUN 10 03/28/2018 1555   BUN 12.3 11/01/2013 1031   CREATININE 1.01 (H) 03/28/2018  1555   CREATININE 0.95 (H) 12/21/2017 1026   CREATININE 0.9 11/01/2013 1031   CALCIUM 9.6 03/28/2018 1555   PROT 6.9 03/28/2018 1555   ALBUMIN 3.8 03/28/2018 1555   AST 18 03/28/2018 1555   ALT 10 03/28/2018 1555   ALKPHOS 83 03/28/2018 1555   BILITOT 0.4 03/28/2018 1555   GFRNONAA 51 (L) 03/28/2018 1555   GFRNONAA 55 (L) 12/21/2017 1026   GFRAA 60 (L) 03/28/2018 1555   GFRAA 64 12/21/2017 1026    No results found for: TOTALPROTELP, ALBUMINELP, A1GS, A2GS, BETS, BETA2SER, GAMS, MSPIKE, SPEI  No results found for: KPAFRELGTCHN, LAMBDASER, KAPLAMBRATIO  Lab Results  Component Value Date   WBC 7.6 03/28/2018   NEUTROABS 5.0 03/28/2018   HGB 12.0 03/28/2018   HCT 37.9 03/28/2018   MCV 101.1 (H) 03/28/2018   PLT 228 03/28/2018    _0 @  Lab Results  Component Value Date   LABCA2 77 (H) 03/08/2011    No components found for: [id number]  No results for input(s): INR in the last 168 hours.  Lab Results  Component Value Date   LABCA2 77 (H) 03/08/2011    No results found for: KNL976  No results found for: BHA193  No results found for: XTK240  No results found for: CA2729  No components found for: HGQUANT  No results found for: CEA1 / No results found for: CEA1   No results found for: AFPTUMOR  No results found for: [place]  No results found for: PSA1  No visits with results within 3 Day(s) from this visit.  Latest known visit with results is:  Hospital Outpatient Visit on 06/07/2018  Component Date Value Ref Range Status  . SARS-CoV-2, NAA 06/07/2018 NOT DETECTED  NOT DETECTED Final   Comment: (NOTE) This test was developed and its performance characteristics determined by [employer]. This test has not been FDA cleared or approved. This test  has been authorized by FDA under an Emergency Use Authorization (EUA). This test is only authorized for the duration of time the declaration that circumstances exist justifying the  authorization of the emergency use of in vitro diagnostic tests for detection of SARS-CoV-2 virus and/or diagnosis of COVID-19 infection under section 564(b)(1) of the Act, 21 U.S.C. 789FYB-0(F)(7), unless the authorization is terminated or revoked sooner. When diagnostic testing is negative, the possibility of a false negative result should be considered in the context of a patient's recent exposures and the presence of clinical signs and symptoms consistent with COVID-19. An individual without symptoms of COVID-19 and who is not shedding SARS-CoV-2 virus would expect to have a negative (not detected) result in this assay. Performed                           At: [facility] [place], [place] [id number] Rush Farmer MD PO:[id number]   . Coronavirus Source 06/07/2018 NASOPHARYNGEAL   Final   Performed at [facility], [place] [address], [place],  [address]    (this displays the last labs from the last 3 days)  No results found for: TOTALPROTELP, ALBUMINELP, A1GS, A2GS, BETS, BETA2SER, GAMS, MSPIKE, SPEI (this displays SPEP labs)  No results found for: KPAFRELGTCHN, LAMBDASER, KAPLAMBRATIO (kappa/lambda light chains)  No results found for: HGBA, HGBA2QUANT, HGBFQUANT, HGBSQUAN (Hemoglobinopathy evaluation)   Lab Results  Component Value Date   LDH 131 07/04/2015    Lab Results  Component Value Date   IRON 179 (H) 07/04/2015   TIBC 249 (L) 07/04/2015   IRONPCTSAT 72 (H) 07/04/2015   (Iron and TIBC)  Lab Results  Component Value Date   FERRITIN 56 11/10/2015    Urinalysis    Component Value Date/Time   COLORURINE YELLOW 10/21/2013 [place] 10/21/2013 1645   LABSPEC 1.013 10/21/2013 1645   PHURINE 5.5 10/21/2013 1645   GLUCOSEU NEG 10/21/2013 1645   HGBUR NEG 10/21/2013 1645   BILIRUBINUR NEG 10/21/2013 1645   KETONESUR NEG 10/21/2013 1645   PROTEINUR NEG 10/21/2013 1645   UROBILINOGEN 0.2  10/21/2013 1645   NITRITE NEG 10/21/2013 1645   LEUKOCYTESUR TRACE (A) 10/21/2013 1645     STUDIES: No results found.   ELIGIBLE FOR AVAILABLE RESEARCH PROTOCOL: no   ASSESSMENT: 85 y.o. [place] woman  (1) status post right upper outer quadrant lumpectomy 03/14/2011 for a pT1b (0.52 cm), pN0 invasive ductal carcinoma, grade 1, estrogen and progesterone receptor positive, HER-2 nonamplified, with an MIB-1 of 10%; a total of 2 axillary lymph nodes were removed.  (a) status post MammoSite radiation between 03/17/2018 and 03/24/2018  (b) opted against adjuvant antiestrogens  (2) status post total abdominal hysterectomy and bilateral salpingo-oophorectomy 06/11/2013 for a pT1a pN0 invasive endometrioid carcinoma, FIGO grade 1, a total of 3 left and 10 right pelvic lymph nodes all clear  (3) status post right breast upper outer quadrant biopsy 03/14/2018 for a clinical T1c N0 invasive ductal carcinoma, grade 2, strongly estrogen and progesterone receptor positive, HER-2 nonamplified, with an MIB-1 of 10%.  (4) definitive surgery pending  (5) anastrozole started 04/01/2018  (6) consider adjuvant radiation  PLAN: Michelle [name] is now 6 months out from her definitive surgery for breast cancer.  She has healed very well and has a normal functional status for her age.  In particular she is taking vitamin D and walking which is important for her bones.  She is tolerating the  anastrozole well and the plan will be to continue that a minimum of 5 years.  She will be scheduled for repeat bone density February of next year, when she has her next mammogram.  Depending on those results we will discuss from a psychologic intervention of her bone density when she returns to see me in April of next year  At this point I am delighted that she is doing so well.  She knows to call for any other issues that may develop before the next visit.   Chauncey Cruel, MD   07/31/2018 2:17 PM Medical Oncology and  Hematology [facility] [address] [place], [place] [address] Tel. [phone]    Fax. [phone]   This document serves as a record of services personally performed by Lurline Del, MD. It was created on his behalf by Wilburn Mylar, a trained medical scribe. The creation of this record is based on the scribe's personal observations and the provider's statements to them.   I, Lurline Del MD, have reviewed the above documentation for accuracy and completeness, and I agree with the above.   I, Lurline Del MD, have reviewed the above documentation for accuracy and completeness, and I agree with the above.

## 2018-08-01 ENCOUNTER — Telehealth: Payer: Self-pay | Admitting: Adult Health

## 2018-08-01 ENCOUNTER — Emergency Department (HOSPITAL_COMMUNITY): Payer: Medicare Other

## 2018-08-01 ENCOUNTER — Telehealth: Payer: Self-pay | Admitting: Oncology

## 2018-08-01 ENCOUNTER — Other Ambulatory Visit: Payer: Self-pay

## 2018-08-01 ENCOUNTER — Emergency Department (HOSPITAL_COMMUNITY)
Admission: EM | Admit: 2018-08-01 | Discharge: 2018-08-01 | Disposition: A | Discharge status: Home / Self Care | Payer: Medicare Other | Attending: Emergency Medicine | Admitting: Emergency Medicine

## 2018-08-01 ENCOUNTER — Encounter (HOSPITAL_COMMUNITY): Payer: Self-pay | Admitting: *Deleted

## 2018-08-01 DIAGNOSIS — K5900 Constipation, unspecified: Secondary | ICD-10-CM | POA: Diagnosis present

## 2018-08-01 DIAGNOSIS — T50905A Adverse effect of unspecified drugs, medicaments and biological substances, initial encounter: Secondary | ICD-10-CM | POA: Diagnosis not present

## 2018-08-01 DIAGNOSIS — Z87891 Personal history of nicotine dependence: Secondary | ICD-10-CM | POA: Insufficient documentation

## 2018-08-01 DIAGNOSIS — Z7902 Long term (current) use of antithrombotics/antiplatelets: Secondary | ICD-10-CM | POA: Insufficient documentation

## 2018-08-01 DIAGNOSIS — K5903 Drug induced constipation: Secondary | ICD-10-CM | POA: Diagnosis not present

## 2018-08-01 DIAGNOSIS — I10 Essential (primary) hypertension: Secondary | ICD-10-CM | POA: Insufficient documentation

## 2018-08-01 DIAGNOSIS — Z79899 Other long term (current) drug therapy: Secondary | ICD-10-CM | POA: Diagnosis not present

## 2018-08-01 DIAGNOSIS — Z8673 Personal history of transient ischemic attack (TIA), and cerebral infarction without residual deficits: Secondary | ICD-10-CM | POA: Insufficient documentation

## 2018-08-01 DIAGNOSIS — R109 Unspecified abdominal pain: Secondary | ICD-10-CM | POA: Diagnosis not present

## 2018-08-01 LAB — I-STAT CHEM 8, ED
BUN: 6 mg/dL — ABNORMAL LOW (ref 8–23)
Calcium, Ion: 1.19 mmol/L (ref 1.15–1.40)
Chloride: 108 mmol/L (ref 98–111)
Creatinine, Ser: 0.8 mg/dL (ref 0.44–1.00)
Glucose, Bld: 127 mg/dL — ABNORMAL HIGH (ref 70–99)
HCT: 44 % (ref 36.0–46.0)
Hemoglobin: 15 g/dL (ref 12.0–15.0)
Potassium: 3.8 mmol/L (ref 3.5–5.1)
Sodium: 140 mmol/L (ref 135–145)
TCO2: 22 mmol/L (ref 22–32)

## 2018-08-01 MED ORDER — SENNOSIDES-DOCUSATE SODIUM 8.6-50 MG PO TABS: 2.0000 | ORAL_TABLET | Freq: Every day | ORAL | 0 refills | Status: AC

## 2018-08-01 MED ORDER — IOHEXOL 300 MG/ML  SOLN
100.0000 mL | Freq: Once | INTRAMUSCULAR | Status: AC | PRN
  Administered 2018-08-01: 100 mL via INTRAVENOUS

## 2018-08-01 MED ORDER — MILK AND MOLASSES ENEMA
1.0000 | Freq: Once | RECTAL | Status: AC
  Administered 2018-08-01: 240 mL via RECTAL
  Filled 2018-08-01: qty 240

## 2018-08-01 NOTE — ED Notes (Signed)
Returned from CT.

## 2018-08-01 NOTE — Telephone Encounter (Signed)
I talk with patient regarding schedule  

## 2018-08-01 NOTE — Telephone Encounter (Signed)
Scheduled appt per 7/1 sch message - pt husband aware of appt date and time

## 2018-08-01 NOTE — ED Provider Notes (Signed)
[facility] EMERGENCY DEPARTMENT Provider Note   CSN: [id number] Arrival date & time: 08/01/18  1357    History   Chief Complaint Chief Complaint  Patient presents with  . Constipation    HPI Michelle [name] is a 85 y.o. female.     85 y.o female with a PMH of AAA, HTN, Hyperlipidemia, Stroke presents to the ED sent in by GI for constipation. Patient reports she was seen at Dr. Hedwig Morton office on Monday were she has an extra of her abdomen done. She reports during appointment she was told how to take care of her constipation, she has taken MiraLAX for the past 2 days without improvement.  Patient reports she has had 3 bowel movements today, she reports is very hard for her to push and she sits with her hand on her hip in order to try to get something out stating that something is left there.  Patient was instructed by the office to come to the ED for rectal disimpaction.  Patient has tried methods such as laxative, prune juice, fruits, enemas at home without improvement.  She reports this complaint began after a lumpectomy which she had a May 12, states she might have gotten constipated due to the pain medication.  Denies any blood in his stool, fevers, shortness of breath or vomiting.  The history is provided by the patient.  Constipation Associated symptoms: no abdominal pain, no back pain, no diarrhea, no dysuria, no fever, no nausea and no vomiting     Past Medical History:  Diagnosis Date  . AAA (abdominal aortic aneurysm) ([place] [place]) 10/2013  . Anxiety   . Basal cell carcinoma 2011   R back  . Bell's palsy 1984  . Bradycardia   . Breast cancer ([place]) 02/2011   US-guided biopsy  . Cataract   . Cholecystitis 2004  . Complication of anesthesia   . Endometrial cancer ([facility]) 05/2013   uterine ca  . GERD (gastroesophageal reflux disease)   . Hiatal hernia 2008  . History of breast cancer   . History of cerebrovascular accident 2009  . Hyperlipidemia   .  Hypertension 2012  . Macular degeneration   . Polyp of colon 2008, 2014   Benign  . PONV (postoperative nausea and vomiting)   . Shingles 2008  . Squamous cell carcinoma    2010-2015  . Squamous cell skin cancer, multiple sites 2014   recurrent  . Stroke ([place]) 09/2007   Slight memory problems residual  . Tendonitis    rt hand  . Vocal cord polyp     Patient Active Problem List   Diagnosis Date Noted  . Abdominal aortic aneurysm (AAA) 3.0 cm to 5.0 cm in diameter in female ([place]) 11/18/2016  . Iron deficiency anemia, unspecified 07/06/2015  . Symptomatic anemia   . History of GI bleed 07/04/2015  . Primary osteoarthritis of right knee 06/22/2015  . Osteoporosis 11/19/2014  . Aneurysm of infrarenal abdominal aorta ([facility]) 11/19/2013  . History of endometrial cancer 04/30/2013  . Squamous cell skin cancer, face 03/22/2012  . Macular degeneration 09/13/2011  . History of breast cancer in female 03/17/2011  . Malignant neoplasm of upper-outer quadrant of right breast in female, estrogen receptor positive ([place]) 02/21/2011  . Essential hypertension, benign 09/26/2007  . OBESITY 09/24/2007  . History of CVA (cerebrovascular accident) 09/24/2007  . Hyperlipidemia LDL goal <100 11/03/2005  . GERD 11/03/2005    Past Surgical History:  Procedure Laterality Date  . APPENDECTOMY    .  BASAL CELL CARCINOMA EXCISION    . biospy  10/28/09   Shave Biospy skin Left hand(Acitinic Keratoses), Right Upper Arm (superficial Basal Cell), Right Upper Back(Superficial Basal Cell), Left Neck ( Solar Lentigo & Seborrheic Keratoses)  . BREAST BIOPSY  02/11/11   Right Breast Needle Core Biospy - Upper Outer Quadrant; ER/PR 100%, Her-2 Neu neg.; Ki-67 10%  . BREAST LUMPECTOMY WITH RADIOACTIVE SEED LOCALIZATION Right 06/12/2018   Procedure: RIGHT BREAST LUMPECTOMY WITH RADIOACTIVE SEED LOCALIZATION;  Surgeon: Rolm Bookbinder, MD;  Location: [facility];  Service: General;  Laterality: Right;   . BREAST MAMMOSITE  03/14/2011   Procedure: MAMMOSITE BREAST;  Surgeon: Rolm Bookbinder, MD;  Location: [facility];  Service: General;  Laterality: Right;  . BREAST SURGERY     right breast lumpectomy snbx  . CHOLECYSTECTOMY  2004  . COLONOSCOPY    . COLONOSCOPY W/ POLYPECTOMY     2008, 2014 (benign)  . COLONOSCOPY WITH PROPOFOL N/A 07/09/2015   Procedure: COLONOSCOPY WITH PROPOFOL;  Surgeon: Wonda Horner, MD;  Location: [facility] ENDOSCOPY;  Service: Endoscopy;  Laterality: N/A;  . DILATION AND CURETTAGE OF UTERUS N/A 04/24/2013   Procedure: DILATATION AND CURETTAGE;  Surgeon: Guss Bunde, MD;  Location: [facility];  Service: Gynecology;  Laterality: N/A;  . ESOPHAGOGASTRODUODENOSCOPY N/A 07/06/2015   Procedure: ESOPHAGOGASTRODUODENOSCOPY (EGD);  Surgeon: Wilford Corner, MD;  Location: [facility] ENDOSCOPY;  Service: Endoscopy;  Laterality: N/A;  . HYSTEROSCOPY N/A 04/24/2013   Procedure: HYSTEROSCOPY;  Surgeon: Guss Bunde, MD;  Location: [facility];  Service: Gynecology;  Laterality: N/A;  . ROBOTIC ASSISTED TOTAL HYSTERECTOMY WITH BILATERAL SALPINGO OOPHERECTOMY Bilateral 06/11/2013   Procedure: ROBOTIC ASSISTED TOTAL HYSTERECTOMY WITH BILATERAL SALPINGO OOPHORECTOMY WITH POSSIBLE STAGING, EPISIOTOMY;  Surgeon: Janie Morning, MD;  Location: [facility];  Service: Gynecology;  Laterality: Bilateral;  . SQUAMOUS CELL CARCINOMA EXCISION    . TEAR DUCT PROBING    . TONSILLECTOMY    . vocal cord poylps  1969   excision     OB History    Gravida  2   Para  2   Term  2   Preterm      AB      Living  2     SAB      TAB      Ectopic      Multiple      Live Births               Home Medications    Prior to Admission medications   Medication Sig Start Date End Date Taking? Authorizing Provider  acetaminophen (TYLENOL) 325 MG tablet Take 650 mg by mouth as needed for mild pain.     [provider]  anastrozole (ARIMIDEX) 1 MG tablet Take 1 tablet (1 mg total) by  mouth daily. 03/28/18   Magrinat, [name], MD  atorvastatin (LIPITOR) 20 MG tablet TAKE 1 TABLET BY MOUTH  EVERY OTHER NIGHT. 04/16/18   Hali Marry, MD  cholecalciferol (VITAMIN D3) 25 MCG (1000 UT) tablet Take 1,000 Units by mouth daily.    [provider]  clopidogrel (PLAVIX) 75 MG tablet TAKE 1 TABLET BY MOUTH  DAILY 03/14/18   Hali Marry, MD  famotidine (PEPCID) 20 MG tablet Take 1 tablet (20 mg total) by mouth 2 (two) times daily. 05/21/18   Hali Marry, MD  fluticasone (FLONASE) 50 MCG/ACT nasal spray INSTILL 2 SPRAYS IN EACH  NOSTRIL EVERY DAY 12/11/17  Hali Marry, MD  losartan (COZAAR) 25 MG tablet TAKE 1 TABLET BY MOUTH  DAILY 03/14/18   Hali Marry, MD  Multiple Vitamins-Minerals (ICAPS AREDS 2 PO) Take by mouth.    [provider]  oxyCODONE (OXY IR/ROXICODONE) 5 MG immediate release tablet Take 1 tablet (5 mg total) by mouth every 6 (six) hours as needed for moderate pain, severe pain or breakthrough pain. 06/12/18   Rolm Bookbinder, MD  Propylene Glycol (SYSTANE COMPLETE OP) Apply to eye.    [provider]  VIACTIV [phone] MG-UNT-MCG CHEW CHEW 1 TABLET BY MOUTH EVERY MORNING. 09/16/14   [provider]    Family History Family History  Problem Relation Age of Onset  . Heart disease Father   . Cancer Sister 54       Lymphoma  . Breast cancer Sister   . Cancer Paternal Aunt        Breast cancer  . Cancer Daughter 70       Bilateral Breast Cancer  . Breast cancer Daughter 71  . Cancer Daughter        Breast Cancer  . Breast cancer Daughter   . Cancer Sister        2005 - Renal Cell Cancer and Breast Cancer  . Breast cancer Sister     Social History Social History   Tobacco Use  . Smoking status: Former Smoker    Packs/day: 1.00    Years: 30.00    Pack years: 30.00    Quit date: 02/01/1996    Years since quitting: 22.5  . Smokeless tobacco: Never Used  Substance Use Topics   . Alcohol use: Yes    Alcohol/week: 1.0 standard drinks    Types: 1 Glasses of wine per week    Comment: Ocassionaly once a year .  Marland Kitchen Drug use: No     Allergies   Benadryl [diphenhydramine hcl], Doxycycline, Simvastatin, Fosamax [alendronate sodium], Lisinopril, and Nsaids   Review of Systems Review of Systems  Constitutional: Negative for chills and fever.  HENT: Negative for ear pain and sore throat.   Eyes: Negative for pain and visual disturbance.  Respiratory: Negative for cough and shortness of breath.   Cardiovascular: Negative for chest pain and palpitations.  Gastrointestinal: Positive for constipation and rectal pain. Negative for abdominal distention, abdominal pain, blood in stool, diarrhea, nausea and vomiting.  Genitourinary: Negative for dysuria and hematuria.  Musculoskeletal: Negative for arthralgias and back pain.  Skin: Negative for color change and rash.  Neurological: Negative for seizures and syncope.  All other systems reviewed and are negative.    Physical Exam Updated Vital Signs BP (!) 144/70   Pulse (!) 48   Temp 98.4 F (36.9 C) (Oral)   Resp 18   SpO2 100%   Physical Exam Vitals signs and nursing note reviewed.  Constitutional:      General: She is not in acute distress.    Appearance: She is well-developed.  HENT:     Head: Normocephalic and atraumatic.     Mouth/Throat:     Pharynx: No oropharyngeal exudate.  Eyes:     Pupils: Pupils are equal, round, and reactive to light.  Neck:     Musculoskeletal: Normal range of motion.  Cardiovascular:     Rate and Rhythm: Regular rhythm.     Heart sounds: Normal heart sounds.  Pulmonary:     Effort: Pulmonary effort is normal. No respiratory distress.     Breath sounds: Normal breath  sounds.  Abdominal:     General: Bowel sounds are normal. There is no distension.     Palpations: Abdomen is soft.     Tenderness: There is no abdominal tenderness.     Comments: No tenderness to palpation  across abdomen region.   Genitourinary:    Exam position: Supine.     Rectum: Tenderness present. No anal fissure, external hemorrhoid or internal hemorrhoid. Normal anal tone.     Comments: Rectal exam performed with chaperone PA student [place]. No stool on rectal vault.  No hemorrhoids internal or externally. Musculoskeletal:        General: No tenderness or deformity.     Right lower leg: No edema.     Left lower leg: No edema.  Skin:    General: Skin is warm and dry.  Neurological:     Mental Status: She is alert and oriented to person, place, and time.      ED Treatments / Results  Labs (all labs ordered are listed, but only abnormal results are displayed) Labs Reviewed  I-STAT CHEM 8, ED - Abnormal; Notable for the following components:      Result Value   BUN 6 (*)    Glucose, Bld 127 (*)    All other components within normal limits    EKG None  Radiology Dg Abd 1 View  Result Date: 07/31/2018 CLINICAL DATA:  Generalized abdominal pain, constipation. EXAM: ABDOMEN - 1 VIEW COMPARISON:  CT scan of November 06, 2013. FINDINGS: No abnormal bowel dilatation is noted. Status post cholecystectomy. Large amount of stool is seen in the rectum. No abnormal calcifications are noted. IMPRESSION: No definite evidence of bowel obstruction or ileus. Large amount of stool is seen in the rectum. Electronically Signed   By: Marijo Conception M.D.   On: 07/31/2018 15:04   Ct Abdomen Pelvis W Contrast  Result Date: 08/01/2018 CLINICAL DATA:  Abdominal pain, possible rectal impaction EXAM: CT ABDOMEN AND PELVIS WITH CONTRAST TECHNIQUE: Multidetector CT imaging of the abdomen and pelvis was performed using the standard protocol following bolus administration of intravenous contrast. CONTRAST:  157m OMNIPAQUE IOHEXOL 300 MG/ML  SOLN COMPARISON:  Plain film from the previous day FINDINGS: Lower chest: Chronic scarring is noted in the bases bilaterally. No acute infiltrate is seen. Hepatobiliary:  No focal liver abnormality is seen. Status post cholecystectomy. No biliary dilatation. Pancreas: Unremarkable. No pancreatic ductal dilatation or surrounding inflammatory changes. Spleen: Normal in size without focal abnormality. Adrenals/Urinary Tract: Adrenal glands are within normal limits. Kidneys are well visualized bilaterally. No renal calculi or obstructive changes are seen. Normal excretion of contrast is noted on delayed images. The bladder is well distended. Stomach/Bowel: There are changes consistent with mild rectal impaction and some wall thickening circumferentially at the distal aspect of the rectum which may represent some mild focal colitis. The more proximal colon is decompressed. No definitive obstructive changes are seen. The appendix has been surgically removed. No small bowel obstructive changes are seen. The stomach shows mild hiatal hernia. Vascular/Lymphatic: Aortic atherosclerosis. Mild ectasia of the distal aorta is noted measuring 2.9 cm. No enlarged abdominal or pelvic lymph nodes. Reproductive: Status post hysterectomy. No adnexal masses. Other: No abdominal wall hernia or abnormality. No abdominopelvic ascites. Musculoskeletal: Degenerative changes of lumbar spine are seen. IMPRESSION: Changes consistent with mild rectal impaction without obstructive change. Some wall thickening is noted in the distal aspect of the rectum which may represent some mild focal colitis. Chronic changes as described above. Electronically  Signed   By: Inez Catalina M.D.   On: 08/01/2018 21:50    Procedures Procedures (including critical care time)  Medications Ordered in ED Medications  milk and molasses enema (240 mLs Rectal Given 08/01/18 1858)  iohexol (OMNIPAQUE) 300 MG/ML solution 100 mL (100 mLs Intravenous Contrast Given 08/01/18 2128)     Initial Impression / Assessment and Plan / ED Course  I have reviewed the triage vital signs and the nursing notes.  Pertinent labs & imaging results  that were available during my care of the patient were reviewed by me and considered in my medical decision making (see chart for details).        Patient with a recent history of a lumpectomy presents to the ED with complaints of constipation.  Seen by GI doctor which he needs office on Monday, had an abdominal series which showed a large amount of stool along the rectum.  Patient was seen by Deliah Goody, PA who recommended patient be valuated in the ED.  We will call GI in order to obtain collateral information.  4:40 PM Spoke to Bertha Stakes PA-C who reports she performed an exam on Monday and did not find any stool on palpation of the rectum. She was advised to follow up with the ED for rectal disimpaction states "they have not done rectal disimpaction in years in the office".   Rectal exam performed in the ED by me, no stool burden on rectal examination, will order milk of molasses to help with patient's constipation.   9:50 PM Spoke [name] daughter who voiced concern for a bowel obstruction.  Advised she will be call once patient returns from CT with results.  An i-STAT Chem-8 was obtained to evaluate patient with CT scan.  No electrolyte abnormality, slight decrease in BUN.  Creatinine level is stable will order CT abdomen and pelvis to rule out any further bowel obstruction. CT abdomen and pelvis showed: Changes consistent with mild rectal impaction without obstructive  change. Some wall thickening is noted in the distal aspect of the  rectum which may represent some mild focal colitis.    Chronic changes as described above.     Shared decision making conversation with patient along with CT abdomen and pelvis results discussed with her.  Advised her she will likely need to increase her laxative medication while at home, patient would prefer to go home at this time with increase in medication.  She is currently taking MiraLAX daily without improvement.  Does report after milk of molasses  had a couple of bowel movements, does not have any abdominal tenderness at this time.  Will prescribe patient senna to help with her constipation along with the MiraLAX.  Patient has been educated on this.  I will also call daughter to update her on discharge.  Patient stable for discharge, afebrile with stable vital signs.  No further work-up warranted. I have discussed this patient with Dr. Vanita Panda, he agrees with management. 10:34 PM Spoke [name] daughter who has been updated on CT results, she will have patient take her medication as prescribed.   Portions of this note were generated with [employer]. Dictation errors may occur despite best attempts at proofreading.    Final Clinical Impressions(s) / ED Diagnoses   Final diagnoses:  Drug-induced constipation    ED Discharge Orders    None       Corinna Capra 08/01/18 2236    Carmin Muskrat, MD 08/01/18 [phone]

## 2018-08-01 NOTE — ED Triage Notes (Signed)
Pt sent here from GI office due to rectal impaction. No other complaints and no distress noted at triage.

## 2018-08-01 NOTE — ED Notes (Signed)
Patient verbalizes understanding of discharge instructions. Opportunity for questioning and answers were provided. Armband removed by staff, pt discharged from ED in wheelchair.  

## 2018-08-01 NOTE — Discharge Instructions (Addendum)
You may continue taking Miralax daily for the next 7 days.Please make sure to increase your oral intake with plenty of water. I have prescribed medication that should help with your constipation please take two tablets daily for the next 7 days. Please follow up with GI as needed. If you experience any fever, blood in your stool, or worsening symptoms please return to the ED.

## 2018-08-01 NOTE — ED Notes (Signed)
Michelle [name] (daughter) [phone] would like to be notified with any updates and discharge

## 2018-08-01 NOTE — ED Notes (Signed)
[phone] Marcie Mowers (daughter); would like an update on the status of the Pt.

## 2018-08-01 NOTE — ED Notes (Signed)
Patient transported to CT 

## 2018-08-14 ENCOUNTER — Ambulatory Visit (INDEPENDENT_AMBULATORY_CARE_PROVIDER_SITE_OTHER): Payer: Medicare Other | Admitting: Family Medicine

## 2018-08-14 ENCOUNTER — Other Ambulatory Visit: Payer: Self-pay

## 2018-08-14 ENCOUNTER — Encounter: Payer: Self-pay | Admitting: Family Medicine

## 2018-08-14 VITALS — BP 127/62 | HR 59 | Ht 67.0 in | Wt 176.0 lb

## 2018-08-14 DIAGNOSIS — H938X1 Other specified disorders of right ear: Secondary | ICD-10-CM | POA: Diagnosis not present

## 2018-08-14 DIAGNOSIS — R413 Other amnesia: Secondary | ICD-10-CM

## 2018-08-14 DIAGNOSIS — G3184 Mild cognitive impairment, so stated: Secondary | ICD-10-CM | POA: Insufficient documentation

## 2018-08-14 DIAGNOSIS — K5909 Other constipation: Secondary | ICD-10-CM | POA: Diagnosis not present

## 2018-08-14 DIAGNOSIS — I878 Other specified disorders of veins: Secondary | ICD-10-CM

## 2018-08-14 HISTORY — DX: Other constipation: K59.09

## 2018-08-14 HISTORY — DX: Mild cognitive impairment of uncertain or unknown etiology: G31.84

## 2018-08-14 HISTORY — DX: Other specified disorders of veins: I87.8

## 2018-08-14 NOTE — Patient Instructions (Addendum)
You can continue your metamucil twice a day and then take one capful of the miralax at bedtime mixed with 6 ounces of water. Continue to hydrate well.      Chronic Constipation  Chronic constipation is a condition in which a person has three or fewer bowel movements a week, for three months or longer. This condition is especially common in older adults. The two main kinds of chronic constipation are secondary constipation and functional constipation. Secondary constipation results from another condition or a treatment. Functional constipation, also called primary or idiopathic constipation, is divided into three types:  Normal transit constipation. In this type, movement of stool through the colon (stool transit) occurs normally.  Slow transit constipation. In this type, stool moves slowly through the colon.  Outlet constipation or pelvic floor dysfunction. In this type, the nerves and muscles that empty the rectum do not work normally. What are the causes? Causes of secondary constipation may include:  Failing to drink enough fluid, eat enough food or fiber, or get physically active.  Pregnancy.  A tear in the anus (anal fissure).  Blockage in the bowel (bowel obstruction).  Narrowing of the bowel (bowel stricture).  Having a long-term medical condition, such as: ? Diabetes. ? Hypothyroidism. ? Multiple sclerosis. ? Parkinson disease. ? Stroke. ? Spinal cord injury. ? Dementia. ? Colon cancer. ? Inflammatory bowel disease (IBD). ? Iron-deficiency anemia. ? Outward collapse of the rectum (rectal prolapse). ? Hemorrhoids.  Taking certain medicines, including: ? Narcotics. These are a certain type of prescription pain medicine. ? Antacids. ? Iron supplements. ? Water pills (diuretics). ? Certain blood pressure medicines. ? Anti-seizure medicines. ? Antidepressants. ? Medicines for Parkinson disease. The cause of functional constipation is not known, but some  conditions are associated with it. These conditions include:  Stress.  Problems in the nerves and muscles that control stool transit.  Weak or impaired pelvic floor muscles. What increases the risk? You may be at higher risk for chronic constipation if you:  Are older than age 23.  Are female.  Live in a long-term care facility.  Do not get much exercise or physical activity (have a sedentary lifestyle).  Do not drink enough fluids.  Do not eat enough food, especially fiber.  Have a long-term disease.  Have a mental health disorder or eating disorder.  Take many medicines. What are the signs or symptoms? The main symptom of chronic constipation is having three or fewer bowel movements a week for several weeks. Other signs and symptoms may vary from person to person. These include:  Pushing hard (straining) to pass stool.  Painful bowel movements.  Having hard or lumpy stools.  Having lower belly discomfort, such as cramps or bloating.  Being unable to have a bowel movement when you feel the urge.  Feeling like you still need to pass stool after a bowel movement.  Feeling that you have something in your rectum that is blocking or preventing bowel movements.  Seeing blood on the toilet paper or in your stool.  Worsening confusion (in older adults). How is this diagnosed? This condition may be diagnosed based on:  Symptoms and medical history. You will be asked about your symptoms, lifestyle, diet, and any medicines that you are taking.  Physical exam. ? Your belly (abdomen) will be examined. ? A digital rectal exam may be done. For this exam, a health care provider places a lubricated, gloved finger into the rectum.  Other tests to check for any underlying causes  of your constipation. These may be ordered if you have bleeding in your rectum, weight loss, or a family history of colon cancer. In these cases, you may have: ? Imaging studies of the colon. These may  include X-ray, ultrasound, or CT scan. ? Blood tests. ? A procedure to examine the inside of your colon (colonoscopy). ? More specialized tests to check:  Whether your anal sphincter works well. This is a ring-shaped muscle that controls the closing of the anus.  How well food moves through your colon. ? Tests to measure the nerve signal in your pelvic floor muscles (electromyography). How is this treated? Treatment for chronic constipation depends on the cause. Most often, treatment starts with:  Being more active and getting regular exercise.  Drinking more fluids.  Adding fiber to your diet. Sources of fiber include fruits, vegetables, whole grains, and fiber supplements.  Using medicines such as stool softeners or medicines that increase contractions in your digestive system (pro-motility agents).  Training your pelvic muscles with biofeedback.  Surgery, if there is obstruction. Treatment for secondary chronic constipation depends on the underlying condition. You may need to:  Stop or change some medicines if they cause constipation.  Use a fiber supplement (bulk laxative) or stool softener.  Use prescription laxative. This works by [employer] into your colon (osmotic laxative). You may also need to see a specialist who treats conditions of the digestive system (gastroenterologist). Follow these instructions at home:   Take over-the-counter and prescription medicines only as told by your health care provider.  If you are taking a laxative, take it as told by your health care provider.  Eat a balanced diet that includes enough fiber. Ask your health care provider to recommend a diet that is right for you.  Drink clear fluids, especially water. Avoid drinking alcohol, caffeine, and soda.  Drink enough fluid to keep your urine pale yellow.  Get some physical activity every day. Ask your health care provider what physical activities are safe for you.  Get colon  cancer screenings as told by your health care provider.  Keep all follow-up visits as told by your health care provider. This is important. Contact a health care provider if:  You are having three or fewer bowel movements a week.  Your stools are hard or lumpy.  You notice blood on the toilet paper or in your stool after you have a bowel movement.  You have unexplained weight loss.  You have rectum (rectal) pain.  You have stool leakage.  You experience nausea or vomiting. Get help right away if:  You have rectal bleeding or you pass blood clots.  You have severe rectal pain.  You have body tissue that pushes out (protrudes) from your anus.  You have severe pain or bloating (distension) in your abdomen.  You have vomiting that you cannot control. Summary  Chronic constipation is a condition in which a person has three or fewer bowel movements a week, for three months or longer.  You may have a higher risk for this condition if you are an older adult, or if you do not drink enough water or get enough physical activity (are sedentary).  Treatment for this condition depends on the cause. Most treatments for chronic constipation include adding fiber to your diet, drinking more fluids, and getting more physical activity. You may also need to treat any underlying medical conditions or stop or change certain medicines if they cause constipation.  If lifestyle changes do not relieve  constipation, your health care provider may recommend taking a laxative. This information is not intended to replace advice given to you by your health care provider. Make sure you discuss any questions you have with your health care provider. Document Released: 08/16/2016 Document Revised: 12/30/2016 Document Reviewed: 10/04/2016 Elsevier Patient Education  2020 [employer].

## 2018-08-14 NOTE — Assessment & Plan Note (Signed)
Discussed diagnosis and the etiology behind it.  Recommend treatment with compression stockings.  If not controlling her symptoms then please let me know.

## 2018-08-14 NOTE — Assessment & Plan Note (Signed)
MMSE score of 29/20 today. Denies any other neurologic sxs. consider stroke.  Encouraged her to exercise regularly, stay hydrated and work on getting regular sleep.

## 2018-08-14 NOTE — Progress Notes (Signed)
Established Patient Office Visit  Subjective:  Patient ID: Michelle [name], female    DOB: 1932-03-16  Age: 85 y.o. MRN: [id number]  CC:  Chief Complaint  Patient presents with  . Follow-up    pt feels like her memory is not as good and she is not as alert    HPI Michelle [name] presents for a couple of concerns.  She was recently actually seen in the emergency department for constipation.  Right after her breast lumpectomy she was on pain medications for about 3 days and says it really just worsened her constipation.  She is currently using Metamucil twice a day and drinking prune juice.  It does seem to help but feels like she is still a little backed up in regards to her bowels.  She says she knows that Demadex can also cause constipation but wonders if any of her medicines other medicines could be causing it  He says since her surgery for lumpectomy in May she just felt like her memory has not been as good.  She just feels like her memory is not as good.  She reports that she is having a hard time remembering pieces of conversations.  She feels like she can remember things like phone numbers perfectly fine.  She denies any other neurologic symptoms such as hearing or vision change or weakness.  Right ear echoing. She had mentioned it during virtual visit for May. Not painful.  She did try her flonase but that didn't seem to helps.  She says no hearing loss.    He also reports that she gets some intermittent lower extremity swelling mostly around her ankles.  It always seems better in the morning and worse during the day.  Is not painful but she does notice it and wonders what could be causing it.  She has had some difficulty with lymphedema particularly in her right leg because of having had lymph nodes removed but says that she does massage them daily and she also has some numbness in that right groin from where the lymph nodes were removed and wonders how long that that may last  Past  Medical History:  Diagnosis Date  . AAA (abdominal aortic aneurysm) ([place]) 10/2013  . Anxiety   . Basal cell carcinoma 2011   R back  . Bell's palsy 1984  . Bradycardia   . Breast cancer ([place]) 02/2011   US-guided biopsy  . Cataract   . Cholecystitis 2004  . Complication of anesthesia   . Endometrial cancer ([place]) 05/2013   uterine ca  . GERD (gastroesophageal reflux disease)   . Hiatal hernia 2008  . History of breast cancer   . History of cerebrovascular accident 2009  . Hyperlipidemia   . Hypertension 2012  . Macular degeneration   . Polyp of colon 2008, 2014   Benign  . PONV (postoperative nausea and vomiting)   . Shingles 2008  . Squamous cell carcinoma    2010-2015  . Squamous cell skin cancer, multiple sites 2014   recurrent  . Stroke ([place]) 09/2007   Slight memory problems residual  . Tendonitis    rt hand  . Vocal cord polyp     Past Surgical History:  Procedure Laterality Date  . APPENDECTOMY    . BASAL CELL CARCINOMA EXCISION    . biospy  10/28/09   Shave Biospy skin Left hand(Acitinic Keratoses), Right Upper Arm (superficial Basal Cell), Right Upper Back(Superficial Basal Cell), Left Neck ( Solar Lentigo &  Seborrheic Keratoses)  . BREAST BIOPSY  02/11/11   Right Breast Needle Core Biospy - Upper Outer Quadrant; ER/PR 100%, Her-2 Neu neg.; Ki-67 10%  . BREAST LUMPECTOMY WITH RADIOACTIVE SEED LOCALIZATION Right 06/12/2018   Procedure: RIGHT BREAST LUMPECTOMY WITH RADIOACTIVE SEED LOCALIZATION;  Surgeon: Rolm Bookbinder, MD;  Location: [facility];  Service: General;  Laterality: Right;  . BREAST MAMMOSITE  03/14/2011   Procedure: MAMMOSITE BREAST;  Surgeon: Rolm Bookbinder, MD;  Location: [facility];  Service: General;  Laterality: Right;  . BREAST SURGERY     right breast lumpectomy snbx  . CHOLECYSTECTOMY  2004  . COLONOSCOPY    . COLONOSCOPY W/ POLYPECTOMY     2008, 2014 (benign)  . COLONOSCOPY WITH PROPOFOL N/A 07/09/2015    Procedure: COLONOSCOPY WITH PROPOFOL;  Surgeon: Wonda Horner, MD;  Location: [facility] ENDOSCOPY;  Service: Endoscopy;  Laterality: N/A;  . DILATION AND CURETTAGE OF UTERUS N/A 04/24/2013   Procedure: DILATATION AND CURETTAGE;  Surgeon: Guss Bunde, MD;  Location: [facility];  Service: Gynecology;  Laterality: N/A;  . ESOPHAGOGASTRODUODENOSCOPY N/A 07/06/2015   Procedure: ESOPHAGOGASTRODUODENOSCOPY (EGD);  Surgeon: Wilford Corner, MD;  Location[facility]COPY;  Service: Endoscopy;  Laterality: N/A;  . HYSTEROSCOPY N/A 04/24/2013   Procedure: HYSTEROSCOPY;  Surgeon: Guss Bunde, MD;  Location: [facility];  Service: Gynecology;  Laterality: N/A;  . ROBOTIC ASSISTED TOTAL HYSTERECTOMY WITH BILATERAL SALPINGO OOPHERECTOMY Bilateral 06/11/2013   Procedure: ROBOTIC ASSISTED TOTAL HYSTERECTOMY WITH BILATERAL SALPINGO OOPHORECTOMY WITH POSSIBLE STAGING, EPISIOTOMY;  Surgeon: Janie Morning, MD;  Location: [facility];  Service: Gynecology;  Laterality: Bilateral;  . SQUAMOUS CELL CARCINOMA EXCISION    . TEAR DUCT PROBING    . TONSILLECTOMY    . vocal cord poylps  1969   excision    Family History  Problem Relation Age of Onset  . Heart disease Father   . Cancer Sister 109       Lymphoma  . Breast cancer Sister   . Cancer Paternal Aunt        Breast cancer  . Cancer Daughter 30       Bilateral Breast Cancer  . Breast cancer Daughter 84  . Cancer Daughter        Breast Cancer  . Breast cancer Daughter   . Cancer Sister        2005 - Renal Cell Cancer and Breast Cancer  . Breast cancer Sister     Social History   Socioeconomic History  . Marital status: Married    Spouse name: Not on file  . Number of children: Not on file  . Years of education: Not on file  . Highest education level: Not on file  Occupational History  . Occupation: retired  Scientific laboratory technician  . Financial resource strain: Not on file  . Food insecurity    Worry: Not on file    Inability: Not on file  . Transportation needs     Medical: No    Non-medical: No  Tobacco Use  . Smoking status: Former Smoker    Packs/day: 1.00    Years: 30.00    Pack years: 30.00    Quit date: 02/01/1996    Years since quitting: 22.5  . Smokeless tobacco: Never Used  Substance and Sexual Activity  . Alcohol use: Yes    Alcohol/week: 1.0 standard drinks    Types: 1 Glasses of wine per week    Comment: Ocassionaly once a year .  Marland Kitchen  Drug use: No  . Sexual activity: Yes    Partners: Male    Comment: Menarche age 51, menopause age 70, HRT x 1year  Lifestyle  . Physical activity    Days per week: Not on file    Minutes per session: Not on file  . Stress: Not on file  Relationships  . Social Herbalist on phone: Not on file    Gets together: Not on file    Attends religious service: Not on file    Active member of club or organization: Not on file    Attends meetings of clubs or organizations: Not on file    Relationship status: Not on file  . Intimate partner violence    Fear of current or ex partner: Not on file    Emotionally abused: Not on file    Physically abused: Not on file    Forced sexual activity: Not on file  Other Topics Concern  . Not on file  Social History Narrative  . Not on file    Outpatient Medications Prior to Visit  Medication Sig Dispense Refill  . acetaminophen (TYLENOL) 325 MG tablet Take 650 mg by mouth as needed for mild pain.     Marland Kitchen anastrozole (ARIMIDEX) 1 MG tablet Take 1 tablet (1 mg total) by mouth daily. 90 tablet 4  . atorvastatin (LIPITOR) 20 MG tablet TAKE 1 TABLET BY MOUTH  EVERY OTHER NIGHT. 45 tablet 3  . cholecalciferol (VITAMIN D3) 25 MCG (1000 UT) tablet Take 1,000 Units by mouth daily.    . clopidogrel (PLAVIX) 75 MG tablet TAKE 1 TABLET BY MOUTH  DAILY 90 tablet 1  . famotidine (PEPCID) 20 MG tablet Take 1 tablet (20 mg total) by mouth 2 (two) times daily. 180 tablet 1  . fluticasone (FLONASE) 50 MCG/ACT nasal spray INSTILL 2 SPRAYS IN EACH  NOSTRIL EVERY DAY (Patient  taking differently: Place 2 sprays into both nostrils as needed. INSTILL 2 SPRAYS IN EACH  NOSTRIL AS NEEDED) 48 g 4  . losartan (COZAAR) 25 MG tablet TAKE 1 TABLET BY MOUTH  DAILY 90 tablet 1  . Multiple Vitamins-Minerals (ICAPS AREDS 2 PO) Take 1 capsule by mouth 2 (two) times daily at 10 AM and 5 PM. Morning and evening    . Propylene Glycol (SYSTANE COMPLETE OP) Apply to eye.    Marland Kitchen VIACTIV 500-500-40 MG-UNT-MCG CHEW CHEW 1 TABLET BY MOUTH EVERY MORNING.  12  . oxyCODONE (OXY IR/ROXICODONE) 5 MG immediate release tablet Take 1 tablet (5 mg total) by mouth every 6 (six) hours as needed for moderate pain, severe pain or breakthrough pain. 10 tablet 0   No facility-administered medications prior to visit.     Allergies  Allergen Reactions  . Benadryl [Diphenhydramine Hcl]     Hyper  . Doxycycline Other (See Comments)    acid reflux  . Simvastatin Other (See Comments)    REACTION: memory loss  . Fosamax [Alendronate Sodium] Other (See Comments)    GERD  . Lisinopril Other (See Comments)    Dry cough  . Nsaids Other (See Comments)    GI Bleed    ROS Review of Systems    Objective:    Physical Exam  Constitutional: She is oriented to person, place, and time. She appears well-developed and well-nourished.  HENT:  Head: Normocephalic and atraumatic.  Right Ear: External ear normal.  Left Ear: External ear normal.  Nose: Nose normal.  TMs and canals are clear.  Eyes: Pupils are equal, round, and reactive to light. Conjunctivae and EOM are normal.  Neck: Neck supple. No thyromegaly present.  Cardiovascular: Normal rate, regular rhythm and normal heart sounds.  Pulmonary/Chest: Effort normal and breath sounds normal. She has no wheezes.  Lymphadenopathy:    She has no cervical adenopathy.  Neurological: She is alert and oriented to person, place, and time.  Skin: Skin is warm and dry.  Psychiatric: She has a normal mood and affect.    BP 127/62   Pulse (!) 59   Ht _0   (1.702 m)   Wt 176 lb (79.8 kg)   SpO2 99%   BMI 27.57 kg/m  Wt Readings from Last 3 Encounters:  08/14/18 176 lb (79.8 kg)  06/12/18 182 lb 5.1 oz (82.7 kg)  06/05/18 181 lb (82.1 kg)     Health Maintenance Due  Topic Date Due  . COLONOSCOPY  07/09/2018    There are no preventive care reminders to display for this patient.  Lab Results  Component Value Date   TSH 2.609 10/21/2013   Lab Results  Component Value Date   WBC 7.6 03/28/2018   HGB 15.0 08/01/2018   HCT 44.0 08/01/2018   MCV 101.1 (H) 03/28/2018   PLT 228 03/28/2018   Lab Results  Component Value Date   NA 140 08/01/2018   K 3.8 08/01/2018   CO2 28 03/28/2018   GLUCOSE 127 (H) 08/01/2018   BUN 6 (L) 08/01/2018   CREATININE 0.80 08/01/2018   BILITOT 0.4 03/28/2018   ALKPHOS 83 03/28/2018   AST 18 03/28/2018   ALT 10 03/28/2018   PROT 6.9 03/28/2018   ALBUMIN 3.8 03/28/2018   CALCIUM 9.6 03/28/2018   ANIONGAP 7 03/28/2018   GFR 71.90 09/07/2011   Lab Results  Component Value Date   CHOL 141 12/21/2017   Lab Results  Component Value Date   HDL 49 (L) 12/21/2017   Lab Results  Component Value Date   LDLCALC 73 12/21/2017   Lab Results  Component Value Date   TRIG 111 12/21/2017   Lab Results  Component Value Date   CHOLHDL 2.9 12/21/2017   No results found for: HGBA1C    Assessment & Plan:   Problem List Items Addressed This Visit      Cardiovascular and Mediastinum   Venous stasis of both lower extremities    Discussed diagnosis and the etiology behind it.  Recommend treatment with compression stockings.  If not controlling her symptoms then please let me know.        Digestive   Chronic constipation - Primary    Discussed treatment regimen.  I encouraged her to continue with fiber supplement daily.  She does not really like the Metamucil she could always try Benefiber.  Also recommended a trial of MiraLAX at bedtime nightly to keep bowels moving regularly.  If they become  too loose then she can decrease to every other night.  I think this would help keep her regular.  If she gets really behind with her bowel movement she can also use magnesium citrate.        Other   Memory change    MMSE score of 29/20 today. Denies any other neurologic sxs. consider stroke.  Encouraged her to exercise regularly, stay hydrated and work on getting regular sleep.           Other Visit Diagnoses    Ear fullness, right         Right  ear echoing-could not find specific  ICD 9 code for this so used right ear fullness.  Did have her do tympanometry today.  Interestingly it did just show a flattening of the curve.  No significant hearing loss that she reports.  But it has been persistent at this point for at least well over a month so would like to consider referring her to ENT for further work-up.  Numbness in the right groin-likely secondary to nerve damage from removal of the lymph nodes.  Explained that it can take up to a year for the nerve to recover and if at that point the numbness has not resolved then it will not likely resolve ever.  No orders of the defined types were placed in this encounter.   Follow-up: Return in about 3 months (around 11/14/2018).    Beatrice Lecher, MD

## 2018-08-14 NOTE — Assessment & Plan Note (Signed)
Discussed treatment regimen.  I encouraged her to continue with fiber supplement daily.  She does not really like the Metamucil she could always try Benefiber.  Also recommended a trial of MiraLAX at bedtime nightly to keep bowels moving regularly.  If they become too loose then she can decrease to every other night.  I think this would help keep her regular.  If she gets really behind with her bowel movement she can also use magnesium citrate.

## 2018-08-15 DIAGNOSIS — H353132 Nonexudative age-related macular degeneration, bilateral, intermediate dry stage: Secondary | ICD-10-CM | POA: Diagnosis not present

## 2018-08-16 ENCOUNTER — Telehealth: Payer: Self-pay | Admitting: *Deleted

## 2018-08-16 NOTE — Telephone Encounter (Signed)
This RN returned VM left by pt requesting a call to discuss recent ER visit for severe constipation caused by the arimidex.  Letta Median informed this RN of recent ER visit due to severe constipation- which included scans " showing I was backed up "  She states she received a milk and molasses enema with minimal relief " because the stool was up too high and they couldn't get me all cleaned out "  She states she was told then " the arimidex is causing the constipation " she went home and found her drug information and noted constipation as a side effect for the arimidex as well.  Camden states when she spoke with Dr Jannifer Rodney earlier in June prior to the ER visit she was having slower BM's but did not associate it with the use of the arimidex " so I told him I wasn't having any problems "  Marciana states history of having slow BM's prior to use of arimidex but now worsening.  She states she has a bowel prophylaxis recommended by her primary MD but due to not moving well and " felling very full " she took 2 duccolax yesterday pm with multiple stools today.  Per end of conversation - this RN informed pt to hold the arimidex at this time- continue with bowel prophylaxis per primary MD and to call this RN on 08/27/2018 with update.  Hena verbalized understanding of above.  No other issues at this time.

## 2018-08-19 ENCOUNTER — Encounter: Payer: Self-pay | Admitting: Family Medicine

## 2018-08-20 ENCOUNTER — Other Ambulatory Visit: Payer: Self-pay | Admitting: *Deleted

## 2018-08-20 MED ORDER — LOSARTAN POTASSIUM 25 MG PO TABS: 25.0000 mg | ORAL_TABLET | Freq: Every day | ORAL | 1 refills | Status: DC

## 2018-08-20 MED ORDER — CLOPIDOGREL BISULFATE 75 MG PO TABS: 75.0000 mg | ORAL_TABLET | Freq: Every day | ORAL | 1 refills | Status: DC

## 2018-08-29 DIAGNOSIS — K5901 Slow transit constipation: Secondary | ICD-10-CM | POA: Diagnosis not present

## 2018-09-14 DIAGNOSIS — H353132 Nonexudative age-related macular degeneration, bilateral, intermediate dry stage: Secondary | ICD-10-CM | POA: Diagnosis not present

## 2018-09-28 DIAGNOSIS — K5901 Slow transit constipation: Secondary | ICD-10-CM | POA: Diagnosis not present

## 2018-10-14 DIAGNOSIS — H353132 Nonexudative age-related macular degeneration, bilateral, intermediate dry stage: Secondary | ICD-10-CM | POA: Diagnosis not present

## 2018-10-24 ENCOUNTER — Telehealth: Payer: Self-pay | Admitting: Adult Health

## 2018-10-24 NOTE — Telephone Encounter (Signed)
I left patient a message regarding phone visit 10/13

## 2018-11-13 ENCOUNTER — Inpatient Hospital Stay: Payer: Medicare Other | Attending: Adult Health | Admitting: Adult Health

## 2018-11-13 DIAGNOSIS — C44320 Squamous cell carcinoma of skin of unspecified parts of face: Secondary | ICD-10-CM | POA: Diagnosis not present

## 2018-11-13 DIAGNOSIS — E2839 Other primary ovarian failure: Secondary | ICD-10-CM | POA: Diagnosis not present

## 2018-11-13 DIAGNOSIS — H353132 Nonexudative age-related macular degeneration, bilateral, intermediate dry stage: Secondary | ICD-10-CM | POA: Diagnosis not present

## 2018-11-13 DIAGNOSIS — C50411 Malignant neoplasm of upper-outer quadrant of right female breast: Secondary | ICD-10-CM | POA: Diagnosis not present

## 2018-11-13 DIAGNOSIS — Z170 Estrogen receptor positive status [ER+]: Secondary | ICD-10-CM

## 2018-11-13 NOTE — Progress Notes (Signed)
SURVIVORSHIP VIRTUAL VISIT:  I connected with Michelle [name] on 11/13/18 at  2:00 PM EDT by telephone and verified that I am speaking with the correct person using two identifiers.   I discussed the limitations, risks, security and privacy concerns of performing an evaluation and management service by telephone and the availability of in person appointments. I also discussed with the patient that there may be a patient responsible charge related to this service. The patient expressed understanding and agreed to proceed.   BRIEF ONCOLOGIC HISTORY:  Oncology History  Malignant neoplasm of upper-outer quadrant of right breast in female, estrogen receptor positive ([place])  03/14/2018 Initial Diagnosis   status post right breast upper outer quadrant biopsy 03/14/2018 for a clinical T1c N0 invasive ductal carcinoma, grade 2, strongly estrogen and progesterone receptor positive, HER-2 nonamplified, with an MIB-1 of 10%.  (recurrence, previous stage IA breast cancer in 2013)     03/14/2018 Cancer Staging   Staging form: Breast, AJCC 8th Edition - Clinical stage from 03/14/2018: Stage IA (rcT1c, cN0, cM0, G2, ER+, PR+, HER2-) - Signed by Gardenia Phlegm, NP on 03/28/2018   04/2018 -  Anti-estrogen oral therapy   Anastrozole daily   06/12/2018 Surgery   Right breast lumpectomy: IDC 1.8cm, grade 2, margins negative   06/27/2018 Cancer Staging   Staging form: Breast, AJCC 8th Edition - Pathologic: Stage IA (pT1c, pN0, cM0, G2, ER+, PR+, HER2-) - Signed by Gardenia Phlegm, NP on 06/27/2018   History of breast cancer in female  03/17/2011 Initial Diagnosis   Breast cancer, female   History of endometrial cancer  04/25/2013 Initial Diagnosis   Endometrial cancer     INTERVAL HISTORY:  Michelle [name] to review her survivorship care plan detailing her treatment course for breast cancer, as well as monitoring long-term side effects of that treatment, education regarding health maintenance,  screening, and overall wellness and health promotion.     Overall, Michelle [name] reports feeling quite well.  She is taking Anastrozole daily and tolerating it well.  She notes that she had some problems following surgery and is on Linzess due to this, because of the significant constipation that it caused.  Her last bone density was in 10/2016.  She is due for a repeat again.    REVIEW OF SYSTEMS:  Review of Systems  Constitutional: Negative for appetite change, chills, fatigue, fever and unexpected weight change.  HENT:   Negative for hearing loss, lump/mass, sore throat and trouble swallowing.   Eyes: Negative for eye problems and icterus.  Respiratory: Negative for chest tightness, cough and shortness of breath.   Cardiovascular: Negative for chest pain, leg swelling and palpitations.  Gastrointestinal: Negative for abdominal distention, abdominal pain, constipation, diarrhea, nausea and vomiting.  Endocrine: Negative for hot flashes.  Musculoskeletal: Negative for arthralgias.  Skin: Negative for itching and rash.  Neurological: Negative for dizziness, extremity weakness, headaches and numbness.  Hematological: Negative for adenopathy. Does not bruise/bleed easily.  Psychiatric/Behavioral: Negative for depression. The patient is not nervous/anxious.    Breast: Denies any new nodularity, masses, tenderness, nipple changes, or nipple discharge.      ONCOLOGY TREATMENT TEAM:  1. Surgeon:  Dr. Donne Hazel at [facility] 2. Medical Oncologist: Dr. Jana Hakim  3. Radiation Oncologist: Dr. Lisbeth Renshaw    PAST MEDICAL/SURGICAL HISTORY:  Past Medical History:  Diagnosis Date  . AAA (abdominal aortic aneurysm) ([place]) 10/2013  . Anxiety   . Basal cell carcinoma 2011   R back  . Bell's palsy  1984  . Bradycardia   . Breast cancer ([place]) 02/2011   US-guided biopsy  . Cataract   . Cholecystitis 2004  . Complication of anesthesia   . Endometrial cancer ([place]) 05/2013   uterine ca  . GERD  (gastroesophageal reflux disease)   . Hiatal hernia 2008  . History of breast cancer   . History of cerebrovascular accident 2009  . Hyperlipidemia   . Hypertension 2012  . Macular degeneration   . Polyp of colon 2008, 2014   Benign  . PONV (postoperative nausea and vomiting)   . Shingles 2008  . Squamous cell carcinoma    2010-2015  . Squamous cell skin cancer, multiple sites 2014   recurrent  . Stroke ([place]) 09/2007   Slight memory problems residual  . Tendonitis    rt hand  . Vocal cord polyp    Past Surgical History:  Procedure Laterality Date  . APPENDECTOMY    . BASAL CELL CARCINOMA EXCISION    . biospy  10/28/09   Shave Biospy skin Left hand(Acitinic Keratoses), Right Upper Arm (superficial Basal Cell), Right Upper Back(Superficial Basal Cell), Left Neck ( Solar Lentigo & Seborrheic Keratoses)  . BREAST BIOPSY  02/11/11   Right Breast Needle Core Biospy - Upper Outer Quadrant; ER/PR 100%, Her-2 Neu neg.; Ki-67 10%  . BREAST LUMPECTOMY WITH RADIOACTIVE SEED LOCALIZATION Right 06/12/2018   Procedure: RIGHT BREAST LUMPECTOMY WITH RADIOACTIVE SEED LOCALIZATION;  Surgeon: Rolm Bookbinder, MD;  Location: [facility];  Service: General;  Laterality: Right;  . BREAST MAMMOSITE  03/14/2011   Procedure: MAMMOSITE BREAST;  Surgeon: Rolm Bookbinder, MD;  Location: [facility];  Service: General;  Laterality: Right;  . BREAST SURGERY     right breast lumpectomy snbx  . CHOLECYSTECTOMY  2004  . COLONOSCOPY    . COLONOSCOPY W/ POLYPECTOMY     2008, 2014 (benign)  . COLONOSCOPY WITH PROPOFOL N/A 07/09/2015   Procedure: COLONOSCOPY WITH PROPOFOL;  Surgeon: Wonda Horner, MD;  Location: [facility] ENDOSCOPY;  Service: Endoscopy;  Laterality: N/A;  . DILATION AND CURETTAGE OF UTERUS N/A 04/24/2013   Procedure: DILATATION AND CURETTAGE;  Surgeon: Guss Bunde, MD;  Location: [facility];  Service: Gynecology;  Laterality: N/A;  . ESOPHAGOGASTRODUODENOSCOPY N/A 07/06/2015    Procedure: ESOPHAGOGASTRODUODENOSCOPY (EGD);  Surgeon: Wilford Corner, MD;  Location: [facility] ENDOSCOPY;  Service: Endoscopy;  Laterality: N/A;  . HYSTEROSCOPY N/A 04/24/2013   Procedure: HYSTEROSCOPY;  Surgeon: Guss Bunde, MD;  Location: [facility];  Service: Gynecology;  Laterality: N/A;  . ROBOTIC ASSISTED TOTAL HYSTERECTOMY WITH BILATERAL SALPINGO OOPHERECTOMY Bilateral 06/11/2013   Procedure: ROBOTIC ASSISTED TOTAL HYSTERECTOMY WITH BILATERAL SALPINGO OOPHORECTOMY WITH POSSIBLE STAGING, EPISIOTOMY;  Surgeon: Janie Morning, MD;  Location: [facility];  Service: Gynecology;  Laterality: Bilateral;  . SQUAMOUS CELL CARCINOMA EXCISION    . TEAR DUCT PROBING    . TONSILLECTOMY    . vocal cord poylps  1969   excision     ALLERGIES:  Allergies  Allergen Reactions  . Benadryl [Diphenhydramine Hcl]     Hyper  . Doxycycline Other (See Comments)    acid reflux  . Simvastatin Other (See Comments)    REACTION: memory loss  . Fosamax [Alendronate Sodium] Other (See Comments)    GERD  . Lisinopril Other (See Comments)    Dry cough  . Nsaids Other (See Comments)    GI Bleed     CURRENT MEDICATIONS:  Outpatient Encounter Medications as of 11/13/2018  Medication Sig  .  acetaminophen (TYLENOL) 325 MG tablet Take 650 mg by mouth as needed for mild pain.   Marland Kitchen anastrozole (ARIMIDEX) 1 MG tablet Take 1 tablet (1 mg total) by mouth daily.  Marland Kitchen atorvastatin (LIPITOR) 20 MG tablet TAKE 1 TABLET BY MOUTH  EVERY OTHER NIGHT.  Marland Kitchen cholecalciferol (VITAMIN D3) 25 MCG (1000 UT) tablet Take 1,000 Units by mouth daily.  . clopidogrel (PLAVIX) 75 MG tablet Take 1 tablet (75 mg total) by mouth daily.  . famotidine (PEPCID) 20 MG tablet Take 1 tablet (20 mg total) by mouth 2 (two) times daily.  . fluticasone (FLONASE) 50 MCG/ACT nasal spray INSTILL 2 SPRAYS IN EACH&nbsp;&nbsp;NOSTRIL EVERY DAY (Patient taking differently: Place 2 sprays into both nostrils as needed. INSTILL 2 SPRAYS IN EACH  NOSTRIL AS NEEDED)  .  losartan (COZAAR) 25 MG tablet Take 1 tablet (25 mg total) by mouth daily.  . Multiple Vitamins-Minerals (ICAPS AREDS 2 PO) Take 1 capsule by mouth 2 (two) times daily at 10 AM and 5 PM. Morning and evening  . Propylene Glycol (SYSTANE COMPLETE OP) Apply to eye.  Marland Kitchen VIACTIV [phone] MG-UNT-MCG CHEW CHEW 1 TABLET BY MOUTH EVERY MORNING.   No facility-administered encounter medications on file as of 11/13/2018.      ONCOLOGIC FAMILY HISTORY:  Family History  Problem Relation Age of Onset  . Heart disease Father   . Cancer Sister 31       Lymphoma  . Breast cancer Sister   . Cancer Paternal Aunt        Breast cancer  . Cancer Daughter 43       Bilateral Breast Cancer  . Breast cancer Daughter 38  . Cancer Daughter        Breast Cancer  . Breast cancer Daughter   . Cancer Sister        2005 - Renal Cell Cancer and Breast Cancer  . Breast cancer Sister      GENETIC COUNSELING/TESTING: Completed in 2013.  Re referred today to see if she is eligible for retesting or not.    SOCIAL HISTORY:  Social History   Socioeconomic History  . Marital status: Married    Spouse name: Not on file  . Number of children: Not on file  . Years of education: Not on file  . Highest education level: Not on file  Occupational History  . Occupation: retired  Scientific laboratory technician  . Financial resource strain: Not on file  . Food insecurity    Worry: Not on file    Inability: Not on file  . Transportation needs    Medical: No    Non-medical: No  Tobacco Use  . Smoking status: Former Smoker    Packs/day: 1.00    Years: 30.00    Pack years: 30.00    Quit date: 02/01/1996    Years since quitting: 22.7  . Smokeless tobacco: Never Used  Substance and Sexual Activity  . Alcohol use: Yes    Alcohol/week: 1.0 standard drinks    Types: 1 Glasses of wine per week    Comment: Ocassionaly once a year .  Marland Kitchen Drug use: No  . Sexual activity: Yes    Partners: Male    Comment: Menarche age 43, menopause age  36, HRT x 1year  Lifestyle  . Physical activity    Days per week: Not on file    Minutes per session: Not on file  . Stress: Not on file  Relationships  . Social connections  Talks on phone: Not on file    Gets together: Not on file    Attends religious service: Not on file    Active member of club or organization: Not on file    Attends meetings of clubs or organizations: Not on file    Relationship status: Not on file  . Intimate partner violence    Fear of current or ex partner: Not on file    Emotionally abused: Not on file    Physically abused: Not on file    Forced sexual activity: Not on file  Other Topics Concern  . Not on file  Social History Narrative  . Not on file     OBSERVATIONS/OBJECTIVE:  Patient is in no apparent distress.  Mood and behavior are normal.  Speech is normal.  Breathing is non labored.    LABORATORY DATA:  None for this visit.  DIAGNOSTIC IMAGING:  None for this visit.      ASSESSMENT AND PLAN:  Ms.. Poust is a pleasant 85 y.o. female with Stage IA right breast invasive ductal carcinoma, ER+/PR+/HER2-, diagnosed in 03/2018, treated with lumpectomy and anti-estrogen therapy with Anastrozole beginning in 3/20250.  She presents to the Survivorship Clinic for our initial meeting and routine follow-up post-completion of treatment for breast cancer.    1. Stage IA right breast cancer:  Ms. Guettler is continuing to recover from definitive treatment for breast cancer. She will follow-up with her medical oncologist, Dr. Jana Hakim in 06/2019 with history and physical exam per surveillance protocol.  She will continue her anti-estrogen therapy with Anastrozole. Thus far, she is tolerating the Anastrozole well, with minimal side effects.  Her mammogram is due 03/2019; orders placed today. I placed a referral to genetics to inquire whether or not she is eligbile for updated testing.    Today, a comprehensive survivorship care plan and treatment summary was  reviewed with the patient today detailing her breast cancer diagnosis, treatment course, potential late/long-term effects of treatment, appropriate follow-up care with recommendations for the future, and patient education resources.  A copy of this summary, along with a letter will be sent to the patient's primary care provider via mail/fax/In Basket message after today's visit.    2. Bone health:  Given Ms. Wrubel's age/history of breast cancer and her current treatment regimen including anti-estrogen therapy with Anastrozole, she is at risk for bone demineralization.  Her last DEXA scan was 10/2016, which showed osteopenia.  She is due for repeat this year.  In the meantime, she was encouraged to increase her consumption of foods rich in calcium, as well as increase her weight-bearing activities.  She was given education on specific activities to promote bone health.  3. Cancer screening:  Due to Ms. Bardon's history and her age, she should receive screening for skin cancers, colon cancer, and gynecologic cancers.  The information and recommendations are listed on the patient's comprehensive care plan/treatment summary and were reviewed in detail with the patient.    4. Health maintenance and wellness promotion: Ms. Montee was encouraged to consume 5-7 servings of fruits and vegetables per day. We reviewed the "Nutrition Rainbow" handout, as well as the handout "Take Control of Your Health and Reduce Your Cancer Risk" from the [employer].  She was also encouraged to engage in moderate to vigorous exercise for 30 minutes per day most days of the week. We discussed the [employer] fitness program, which is designed for cancer survivors to help them become more physically fit after  cancer treatments.  She was instructed to limit her alcohol consumption and continue to abstain from tobacco use.     5. Support services/counseling: It is not uncommon for this period of the patient's cancer care  trajectory to be one of many emotions and stressors.  We discussed how this can be increasingly difficult during the times of quarantine and social distancing due to the COVID-19 pandemic.   She was given information regarding our available services and encouraged to contact me with any questions or for help enrolling in any of our support group/programs.    Follow up instructions:    -Return to cancer center 06/2019 for f/u with Dr. Jana Hakim  -Mammogram due in 03/2019 -Follow up with surgery 12/2018 -Bone density 11/2018 -She is welcome to return back to the Survivorship Clinic at any time; no additional follow-up needed at this time.  -Consider referral back to survivorship as a long-term survivor for continued surveillance  The patient was provided an opportunity to ask questions and all were answered. The patient agreed with the plan and demonstrated an understanding of the instructions.   The patient was advised to call back or seek an in-person evaluation if the symptoms worsen or if the condition fails to improve as anticipated.   I provided 25 minutes of non face-to-face telephone visit time during this encounter, and > 50% was spent counseling as documented under my assessment & plan.  Scot Dock, NP

## 2018-11-14 ENCOUNTER — Telehealth: Payer: Self-pay | Admitting: Adult Health

## 2018-11-14 NOTE — Telephone Encounter (Signed)
I talk with patient regarding schedule  

## 2018-11-15 ENCOUNTER — Ambulatory Visit (INDEPENDENT_AMBULATORY_CARE_PROVIDER_SITE_OTHER): Payer: Medicare Other

## 2018-11-15 ENCOUNTER — Ambulatory Visit (INDEPENDENT_AMBULATORY_CARE_PROVIDER_SITE_OTHER): Payer: Medicare Other | Admitting: Family Medicine

## 2018-11-15 ENCOUNTER — Other Ambulatory Visit: Payer: Self-pay

## 2018-11-15 ENCOUNTER — Encounter: Payer: Self-pay | Admitting: Family Medicine

## 2018-11-15 VITALS — BP 138/66 | HR 57 | Ht 67.0 in | Wt 171.0 lb

## 2018-11-15 DIAGNOSIS — K5909 Other constipation: Secondary | ICD-10-CM | POA: Diagnosis not present

## 2018-11-15 DIAGNOSIS — R0602 Shortness of breath: Secondary | ICD-10-CM

## 2018-11-15 DIAGNOSIS — H9311 Tinnitus, right ear: Secondary | ICD-10-CM | POA: Diagnosis not present

## 2018-11-15 DIAGNOSIS — Z23 Encounter for immunization: Secondary | ICD-10-CM | POA: Diagnosis not present

## 2018-11-15 DIAGNOSIS — M818 Other osteoporosis without current pathological fracture: Secondary | ICD-10-CM

## 2018-11-15 DIAGNOSIS — I10 Essential (primary) hypertension: Secondary | ICD-10-CM | POA: Diagnosis not present

## 2018-11-15 NOTE — Progress Notes (Signed)
Established Patient Office Visit  Subjective:  Patient ID: Michelle [name], female    DOB: [birth year]  Age: 85 y.o. MRN: [id number]  CC:  Chief Complaint  Patient presents with  . Constipation  . Hypertension    HPI Michelle [name] presents for  Hypertension- Pt denies chest pain, SOB, dizziness, or heart palpitations.  Taking meds as directed w/o problems.  Denies medication side effects.    She is also here for follow-up of constipation.  She had actually been to the emergency department and evaluated and then I saw her for follow-up in July.  They had been in touch a couple times about a bowel regimen.  Since then she actually saw GI and ended up having a CT scan.  They actually put her on Linzess.  She says it has worked really well to the point that she is actually having loose stools frequently.  She says it almost makes her feel weak when she has loose stools.  She wants to know if she might be able to discontinue the medication and just restart her prune juice.  She really did not have any major problems until she was placed on narcotics after surgery.  She also has noticed that she is starting to hunch over more when she stands or walks.  She is not really sure why but says she is just been catching herself doing it over the last couple of months.  She says she sometimes notices that when this happens she will just feel a little bit more short of breath than she is used to.  No wheezing or extra chest noises.  No cough.  Just feeling like she has to work a little more to catch her breath.  Though she has been at home more with Covid but says she is always active and she is always up on her feet.  She rarely sits for an extended period of time.  She does have a diagnosis of osteoporosis and has for several years.  In fact 4 years ago they had discussed putting her on Prolia but she had read that with potential side effects was deaf and so had declined to take it.  She is intolerant of  bisphosphonates.  She actually has a bone density scheduled next week.  She wanted to ask about the Prolia again and what I thought about it.  She says she still noticing some occasional echoing in her ears.  She had mentions it at the previous 2 visits the last time being in July.  She says since then it has gotten much less frequent and just happens occasionally.  We did do a tympanogram which showed some flattening of the curve and she denied any significant hearing loss.  We discussed possible ENT referral but the time she wanted to hold off and see if it got better.  She is wondering if now she is just getting used to it or if it really is better.  Past Medical History:  Diagnosis Date  . AAA (abdominal aortic aneurysm) ([place]) 10/2013  . Anxiety   . Basal cell carcinoma 2011   R back  . Bell's palsy 1984  . Bradycardia   . Breast cancer ([place]) 02/2011   US-guided biopsy  . Cataract   . Cholecystitis 2004  . Complication of anesthesia   . Endometrial cancer ([place]) 05/2013   uterine ca  . GERD (gastroesophageal reflux disease)   . Hiatal hernia 2008  . History of breast  cancer   . History of cerebrovascular accident 2009  . Hyperlipidemia   . Hypertension 2012  . Macular degeneration   . Polyp of colon 2008, 2014   Benign  . PONV (postoperative nausea and vomiting)   . Shingles 2008  . Squamous cell carcinoma    2010-2015  . Squamous cell skin cancer, multiple sites 2014   recurrent  . Stroke ([place]) 09/2007   Slight memory problems residual  . Tendonitis    rt hand  . Vocal cord polyp     Past Surgical History:  Procedure Laterality Date  . APPENDECTOMY    . BASAL CELL CARCINOMA EXCISION    . biospy  10/28/09   Shave Biospy skin Left hand(Acitinic Keratoses), Right Upper Arm (superficial Basal Cell), Right Upper Back(Superficial Basal Cell), Left Neck ( Solar Lentigo & Seborrheic Keratoses)  . BREAST BIOPSY  02/11/11   Right Breast Needle Core Biospy - Upper Outer Quadrant;  ER/PR 100%, Her-2 Neu neg.; Ki-67 10%  . BREAST LUMPECTOMY WITH RADIOACTIVE SEED LOCALIZATION Right 06/12/2018   Procedure: RIGHT BREAST LUMPECTOMY WITH RADIOACTIVE SEED LOCALIZATION;  Surgeon: Rolm Bookbinder, MD;  Location: [facility];  Service: General;  Laterality: Right;  . BREAST MAMMOSITE  03/14/2011   Procedure: MAMMOSITE BREAST;  Surgeon: Rolm Bookbinder, MD;  Location: [facility];  Service: General;  Laterality: Right;  . BREAST SURGERY     right breast lumpectomy snbx  . CHOLECYSTECTOMY  2004  . COLONOSCOPY    . COLONOSCOPY W/ POLYPECTOMY     2008, 2014 (benign)  . COLONOSCOPY WITH PROPOFOL N/A 07/09/2015   Procedure: COLONOSCOPY WITH PROPOFOL;  Surgeon: Wonda Horner, MD;  Location: [facility] ENDOSCOPY;  Service: Endoscopy;  Laterality: N/A;  . DILATION AND CURETTAGE OF UTERUS N/A 04/24/2013   Procedure: DILATATION AND CURETTAGE;  Surgeon: Guss Bunde, MD;  Location: [facility];  Service: Gynecology;  Laterality: N/A;  . ESOPHAGOGASTRODUODENOSCOPY N/A 07/06/2015   Procedure: ESOPHAGOGASTRODUODENOSCOPY (EGD);  Surgeon: Wilford Corner, MD;  Location: [facility] ENDOSCOPY;  Service: Endoscopy;  Laterality: N/A;  . HYSTEROSCOPY N/A 04/24/2013   Procedure: HYSTEROSCOPY;  Surgeon: Guss Bunde, MD;  Location: [facility];  Service: Gynecology;  Laterality: N/A;  . ROBOTIC ASSISTED TOTAL HYSTERECTOMY WITH BILATERAL SALPINGO OOPHERECTOMY Bilateral 06/11/2013   Procedure: ROBOTIC ASSISTED TOTAL HYSTERECTOMY WITH BILATERAL SALPINGO OOPHORECTOMY WITH POSSIBLE STAGING, EPISIOTOMY;  Surgeon: Janie Morning, MD;  Location: [facility];  Service: Gynecology;  Laterality: Bilateral;  . SQUAMOUS CELL CARCINOMA EXCISION    . TEAR DUCT PROBING    . TONSILLECTOMY    . vocal cord poylps  1969   excision    Family History  Problem Relation Age of Onset  . Heart disease Father   . Cancer Sister 70       Lymphoma  . Breast cancer Sister   . Cancer Paternal Aunt        Breast cancer  .  Cancer Daughter 59       Bilateral Breast Cancer  . Breast cancer Daughter 3  . Cancer Daughter        Breast Cancer  . Breast cancer Daughter   . Cancer Sister        2005 - Renal Cell Cancer and Breast Cancer  . Breast cancer Sister     Social History   Socioeconomic History  . Marital status: Married    Spouse name: Not on file  . Number of children: Not on file  . Years of education:  Not on file  . Highest education level: Not on file  Occupational History  . Occupation: retired  Scientific laboratory technician  . Financial resource strain: Not on file  . Food insecurity    Worry: Not on file    Inability: Not on file  . Transportation needs    Medical: No    Non-medical: No  Tobacco Use  . Smoking status: Former Smoker    Packs/day: 1.00    Years: 30.00    Pack years: 30.00    Quit date: 02/01/1996    Years since quitting: 22.8  . Smokeless tobacco: Never Used  Substance and Sexual Activity  . Alcohol use: Yes    Alcohol/week: 1.0 standard drinks    Types: 1 Glasses of wine per week    Comment: Ocassionaly once a year .  Marland Kitchen Drug use: No  . Sexual activity: Yes    Partners: Male    Comment: Menarche age 13, menopause age 39, HRT x 1year  Lifestyle  . Physical activity    Days per week: Not on file    Minutes per session: Not on file  . Stress: Not on file  Relationships  . Social Herbalist on phone: Not on file    Gets together: Not on file    Attends religious service: Not on file    Active member of club or organization: Not on file    Attends meetings of clubs or organizations: Not on file    Relationship status: Not on file  . Intimate partner violence    Fear of current or ex partner: Not on file    Emotionally abused: Not on file    Physically abused: Not on file    Forced sexual activity: Not on file  Other Topics Concern  . Not on file  Social History Narrative  . Not on file    Outpatient Medications Prior to Visit  Medication Sig Dispense  Refill  . acetaminophen (TYLENOL) 325 MG tablet Take 650 mg by mouth as needed for mild pain.     Marland Kitchen anastrozole (ARIMIDEX) 1 MG tablet Take 1 tablet (1 mg total) by mouth daily. 90 tablet 4  . atorvastatin (LIPITOR) 20 MG tablet TAKE 1 TABLET BY MOUTH  EVERY OTHER NIGHT. 45 tablet 3  . cholecalciferol (VITAMIN D3) 25 MCG (1000 UT) tablet Take 1,000 Units by mouth daily.    . clopidogrel (PLAVIX) 75 MG tablet Take 1 tablet (75 mg total) by mouth daily. 90 tablet 1  . famotidine (PEPCID) 20 MG tablet Take 1 tablet (20 mg total) by mouth 2 (two) times daily. 180 tablet 1  . fluticasone (FLONASE) 50 MCG/ACT nasal spray INSTILL 2 SPRAYS IN EACH  NOSTRIL EVERY DAY (Patient taking differently: Place 2 sprays into both nostrils as needed. INSTILL 2 SPRAYS IN EACH  NOSTRIL AS NEEDED) 48 g 4  . LINZESS 145 MCG CAPS capsule     . losartan (COZAAR) 25 MG tablet Take 1 tablet (25 mg total) by mouth daily. 90 tablet 1  . Multiple Vitamins-Minerals (ICAPS AREDS 2 PO) Take 1 capsule by mouth 2 (two) times daily at 10 AM and 5 PM. Morning and evening    . VIACTIV [phone] MG-UNT-MCG CHEW CHEW 1 TABLET BY MOUTH EVERY MORNING.  12  . Propylene Glycol (SYSTANE COMPLETE OP) Apply to eye.     No facility-administered medications prior to visit.     Allergies  Allergen Reactions  . Benadryl [Diphenhydramine Hcl]  Hyper  . Doxycycline Other (See Comments)    acid reflux  . Simvastatin Other (See Comments)    REACTION: memory loss  . Fosamax [Alendronate Sodium] Other (See Comments)    GERD  . Lisinopril Other (See Comments)    Dry cough  . Nsaids Other (See Comments)    GI Bleed    ROS Review of Systems    Objective:    Physical Exam  BP 138/66   Pulse (!) 57   Ht 5' 7"  (1.702 m)   Wt 171 lb (77.6 kg)   SpO2 100%   BMI 26.78 kg/m  Wt Readings from Last 3 Encounters:  11/15/18 171 lb (77.6 kg)  08/14/18 176 lb (79.8 kg)  06/12/18 182 lb 5.1 oz (82.7 kg)     Health Maintenance Due   Topic Date Due  . COLONOSCOPY  07/09/2018  . INFLUENZA VACCINE  09/01/2018    There are no preventive care reminders to display for this patient.  Lab Results  Component Value Date   TSH 2.609 10/21/2013   Lab Results  Component Value Date   WBC 7.6 03/28/2018   HGB 15.0 08/01/2018   HCT 44.0 08/01/2018   MCV 101.1 (H) 03/28/2018   PLT 228 03/28/2018   Lab Results  Component Value Date   NA 140 08/01/2018   K 3.8 08/01/2018   CO2 28 03/28/2018   GLUCOSE 127 (H) 08/01/2018   BUN 6 (L) 08/01/2018   CREATININE 0.80 08/01/2018   BILITOT 0.4 03/28/2018   ALKPHOS 83 03/28/2018   AST 18 03/28/2018   ALT 10 03/28/2018   PROT 6.9 03/28/2018   ALBUMIN 3.8 03/28/2018   CALCIUM 9.6 03/28/2018   ANIONGAP 7 03/28/2018   GFR 71.90 09/07/2011   Lab Results  Component Value Date   CHOL 141 12/21/2017   Lab Results  Component Value Date   HDL 49 (L) 12/21/2017   Lab Results  Component Value Date   LDLCALC 73 12/21/2017   Lab Results  Component Value Date   TRIG 111 12/21/2017   Lab Results  Component Value Date   CHOLHDL 2.9 12/21/2017   No results found for: HGBA1C    Assessment & Plan:   Problem List Items Addressed This Visit      Cardiovascular and Mediastinum   Essential hypertension, benign - Primary    Well controlled. Continue current regimen. Follow up in  6 mo      Relevant Orders   COMPLETE METABOLIC PANEL WITH GFR   Lipid panel     Digestive   Chronic constipation    She would like to go back to her prune juice which I think is not unreasonable.  She can have the Linzess on hand so if she needs to restart it she can.  If she does restart it she can always take it every other day if her stools are too loose and too frequent which it sounds like they have been.        Musculoskeletal and Integument   Osteoporosis    Intolerant to bisphosphonates.  Actually that should be a great candidate for Prolia.  We discussed the medication is a good  option.  She has a DEXA scan scheduled next week with her oncologist.       Other Visit Diagnoses    Need for immunization against influenza       Relevant Orders   Flu Vaccine QUAD High Dose(Fluad) (Completed)   SOB (shortness of breath)  Relevant Orders   DG Chest 2 View   Ear noise/buzzing, right       Relevant Orders   Ambulatory referral to ENT     Echoing in right ear-we will plan to go ahead and referring her to ENT.  I do not think there is anything worrisome specifically going on it does sound like it got a lot better than when I saw her in July but she is clearly still very concerned about it which is not surprising with her recent diagnosis of breast cancer in the last year.  So plan will be to send her for further evaluation.  Shortness of breath-unclear etiology it sounds like some of it may just be postural and when she is bent forward she has a little bit of restriction of the lungs and notices that she is working little harder to breathe.  We did discuss working on posture.  Were going to get chest x-ray today for further evaluation and if negative consider spirometry if symptoms persist.  No orders of the defined types were placed in this encounter.   Follow-up: Return in about 4 months (around 03/18/2019) for Hypertension.    Beatrice Lecher, MD

## 2018-11-15 NOTE — Assessment & Plan Note (Signed)
She would like to go back to her prune juice which I think is not unreasonable.  She can have the Linzess on hand so if she needs to restart it she can.  If she does restart it she can always take it every other day if her stools are too loose and too frequent which it sounds like they have been.

## 2018-11-15 NOTE — Assessment & Plan Note (Signed)
Intolerant to bisphosphonates.  Actually that should be a great candidate for Prolia.  We discussed the medication is a good option.  She has a DEXA scan scheduled next week with her oncologist.

## 2018-11-15 NOTE — Patient Instructions (Signed)
Ok to stop the linzess.    Consider trying the Prolia

## 2018-11-15 NOTE — Assessment & Plan Note (Signed)
Well controlled. Continue current regimen. Follow up in  6 mo  

## 2018-11-16 ENCOUNTER — Encounter: Payer: Self-pay | Admitting: Adult Health

## 2018-11-16 LAB — COMPLETE METABOLIC PANEL WITH GFR
AG Ratio: 1.7 (calc) (ref 1.0–2.5)
ALT: 8 U/L (ref 6–29)
AST: 15 U/L (ref 10–35)
Albumin: 4.3 g/dL (ref 3.6–5.1)
Alkaline phosphatase (APISO): 90 U/L (ref 37–153)
BUN: 14 mg/dL (ref 7–25)
CO2: 29 mmol/L (ref 20–32)
Calcium: 10.2 mg/dL (ref 8.6–10.4)
Chloride: 104 mmol/L (ref 98–110)
Creat: 0.88 mg/dL (ref 0.60–0.88)
GFR, Est African American: 70 mL/min/{1.73_m2} (ref >=60)
GFR, Est Non African American: 61 mL/min/{1.73_m2} (ref >=60)
Globulin: 2.5 g/dL (calc) (ref 1.9–3.7)
Glucose, Bld: 97 mg/dL (ref 65–99)
Potassium: 4.1 mmol/L (ref 3.5–5.3)
Sodium: 139 mmol/L (ref 135–146)
Total Bilirubin: 0.6 mg/dL (ref 0.2–1.2)
Total Protein: 6.8 g/dL (ref 6.1–8.1)

## 2018-11-16 LAB — LIPID PANEL
Cholesterol: 146 mg/dL (ref <200)
HDL: 53 mg/dL (ref >=50)
LDL Cholesterol (Calc): 73 mg/dL (calc)
Non-HDL Cholesterol (Calc): 93 mg/dL (calc) (ref <130)
Total CHOL/HDL Ratio: 2.8 (calc) (ref <5.0)
Triglycerides: 113 mg/dL (ref <150)

## 2018-11-23 ENCOUNTER — Telehealth: Payer: Self-pay | Admitting: Genetic Counselor

## 2018-11-23 NOTE — Telephone Encounter (Signed)
Called patient regarding upcoming [facility] appointment, per patient's request appointment has been cancelled. Patient will call PCP and will decide if she would like to reschedule.

## 2018-11-26 ENCOUNTER — Inpatient Hospital Stay: Payer: Medicare Other | Admitting: Genetic Counselor

## 2018-11-26 ENCOUNTER — Inpatient Hospital Stay: Payer: Medicare Other

## 2018-11-27 ENCOUNTER — Ambulatory Visit (INDEPENDENT_AMBULATORY_CARE_PROVIDER_SITE_OTHER): Payer: Medicare Other | Admitting: Family Medicine

## 2018-11-27 ENCOUNTER — Other Ambulatory Visit: Payer: Self-pay

## 2018-11-27 ENCOUNTER — Encounter: Payer: Self-pay | Admitting: Family Medicine

## 2018-11-27 VITALS — BP 116/91 | HR 56 | Temp 98.0°F | Ht 67.0 in | Wt 173.0 lb

## 2018-11-27 DIAGNOSIS — R0602 Shortness of breath: Secondary | ICD-10-CM | POA: Diagnosis not present

## 2018-11-27 MED ORDER — ALBUTEROL SULFATE (2.5 MG/3ML) 0.083% IN NEBU: 2.5000 mg | INHALATION_SOLUTION | Freq: Once | RESPIRATORY_TRACT | 0 refills | Status: DC

## 2018-11-27 MED ORDER — ALBUTEROL SULFATE HFA 108 (90 BASE) MCG/ACT IN AERS: 2.0000 | INHALATION_SPRAY | Freq: Four times a day (QID) | RESPIRATORY_TRACT | 1 refills | Status: DC | PRN

## 2018-11-27 NOTE — Progress Notes (Signed)
Spirometry  Established Patient Office Visit  Subjective:  Patient ID: Michelle [name], female    DOB: [birth year]  Age: 85 y.o. MRN: [id number]  CC:  Chief Complaint  Patient presents with  . Follow-up    HPI Michelle [name] presents for spirometry.  Patient was recently here for a routine appointment when she had mentioned that she was noticing that she was occasionally feeling more short of breath.  She says today she remembered that a couple weeks before she started noticing the shortness of breath she had gotten around some bleach fumes that were really quite bothersome to her.  She had never really noticed the shortness of breath prior to that.  She does not currently use inhalers has never had a diagnosis of asthma.  She does report being around a lot of chemicals over her lifetime.  She is here today with her daughter.  She denies any wheezing.  Past Medical History:  Diagnosis Date  . AAA (abdominal aortic aneurysm) ([place]) 10/2013  . Anxiety   . Basal cell carcinoma 2011   R back  . Bell's palsy 1984  . Bradycardia   . Breast cancer ([name]) 02/2011   US-guided biopsy  . Cataract   . Cholecystitis 2004  . Complication of anesthesia   . Endometrial cancer ([name]) 05/2013   uterine ca  . GERD (gastroesophageal reflux disease)   . Hiatal hernia 2008  . History of breast cancer   . History of cerebrovascular accident 2009  . Hyperlipidemia   . Hypertension 2012  . Macular degeneration   . Polyp of colon 2008, 2014   Benign  . PONV (postoperative nausea and vomiting)   . Shingles 2008  . Squamous cell carcinoma    2010-2015  . Squamous cell skin cancer, multiple sites 2014   recurrent  . Stroke ([name]) 09/2007   Slight memory problems residual  . Tendonitis    rt hand  . Vocal cord polyp     Past Surgical History:  Procedure Laterality Date  . APPENDECTOMY    . BASAL CELL CARCINOMA EXCISION    . biospy  10/28/09   Shave Biospy skin Left hand(Acitinic Keratoses), Right Upper  Arm (superficial Basal Cell), Right Upper Back(Superficial Basal Cell), Left Neck ( Solar Lentigo & Seborrheic Keratoses)  . BREAST BIOPSY  02/11/11   Right Breast Needle Core Biospy - Upper Outer Quadrant; ER/PR 100%, Her-2 Neu neg.; Ki-67 10%  . BREAST LUMPECTOMY WITH RADIOACTIVE SEED LOCALIZATION Right 06/12/2018   Procedure: RIGHT BREAST LUMPECTOMY WITH RADIOACTIVE SEED LOCALIZATION;  Surgeon: Rolm Bookbinder, MD;  Location: [facility];  Service: General;  Laterality: Right;  . BREAST MAMMOSITE  03/14/2011   Procedure: MAMMOSITE BREAST;  Surgeon: Rolm Bookbinder, MD;  Location: [facility];  Service: General;  Laterality: Right;  . BREAST SURGERY     right breast lumpectomy snbx  . CHOLECYSTECTOMY  2004  . COLONOSCOPY    . COLONOSCOPY W/ POLYPECTOMY     2008, 2014 (benign)  . COLONOSCOPY WITH PROPOFOL N/A 07/09/2015   Procedure: COLONOSCOPY WITH PROPOFOL;  Surgeon: Wonda Horner, MD;  [facility];  Service: Endoscopy;  Laterality: N/A;  . DILATION AND CURETTAGE OF UTERUS N/A 04/24/2013   Procedure: DILATATION AND CURETTAGE;  Surgeon: Guss Bunde, MD;  Location: [place] [place] [facility];  Service: Gynecology;  Laterality: N/A;  . ESOPHAGOGASTRODUODENOSCOPY N/A 07/06/2015   Procedure: ESOPHAGOGASTRODUODENOSCOPY (EGD);  Surgeon: Wilford Corner, MD;  Location: [facility] END[facility]COPY;  Service: Endoscopy;  Laterality: N/A;  . HYSTEROSCOPY N/A 04/24/2013   Procedure: HYSTEROSCOPY;  Surgeon: Guss Bunde, MD;  Location: [facility];  Service: Gynecology;  Laterality: N/A;  . ROBOTIC ASSISTED TOTAL HYSTERECTOMY WITH BILATERAL SALPINGO OOPHERECTOMY Bilateral 06/11/2013   Procedure: ROBOTIC ASSISTED TOTAL HYSTERECTOMY WITH BILATERAL SALPINGO OOPHORECTOMY WITH POSSIBLE STAGING, EPISIOTOMY;  Surgeon: Janie Morning, MD;  Location: [facility];  Service: Gynecology;  Laterality: Bilateral;  . SQUAMOUS CELL CARCINOMA EXCISION    . TEAR DUCT PROBING    . TONSILLECTOMY    . vocal cord poylps   1969   excision    Family History  Problem Relation Age of Onset  . Heart disease Father   . Cancer Sister 70       Lymphoma  . Breast cancer Sister   . Cancer Paternal Aunt        Breast cancer  . Cancer Daughter 43       Bilateral Breast Cancer  . Breast cancer Daughter 56  . Cancer Daughter        Breast Cancer  . Breast cancer Daughter   . Cancer Sister        2005 - Renal Cell Cancer and Breast Cancer  . Breast cancer Sister     Social History   Socioeconomic History  . Marital status: Married    Spouse name: Not on file  . Number of children: Not on file  . Years of education: Not on file  . Highest education level: Not on file  Occupational History  . Occupation: retired  Scientific laboratory technician  . Financial resource strain: Not on file  . Food insecurity    Worry: Not on file    Inability: Not on file  . Transportation needs    Medical: No    Non-medical: No  Tobacco Use  . Smoking status: Former Smoker    Packs/day: 1.00    Years: 30.00    Pack years: 30.00    Quit date: 02/01/1996    Years since quitting: 22.8  . Smokeless tobacco: Never Used  Substance and Sexual Activity  . Alcohol use: Yes    Alcohol/week: 1.0 standard drinks    Types: 1 Glasses of wine per week    Comment: Ocassionaly once a year .  Marland Kitchen Drug use: No  . Sexual activity: Yes    Partners: Male    Comment: Menarche age 58, menopause age 25, HRT x 1year  Lifestyle  . Physical activity    Days per week: Not on file    Minutes per session: Not on file  . Stress: Not on file  Relationships  . Social Herbalist on phone: Not on file    Gets together: Not on file    Attends religious service: Not on file    Active member of club or organization: Not on file    Attends meetings of clubs or organizations: Not on file    Relationship status: Not on file  . Intimate partner violence    Fear of current or ex partner: Not on file    Emotionally abused: Not on file    Physically  abused: Not on file    Forced sexual activity: Not on file  Other Topics Concern  . Not on file  Social History Narrative  . Not on file    Outpatient Medications Prior to Visit  Medication Sig Dispense Refill  . acetaminophen (TYLENOL) 325 MG tablet Take 650 mg by mouth as needed for  mild pain.     Marland Kitchen anastrozole (ARIMIDEX) 1 MG tablet Take 1 tablet (1 mg total) by mouth daily. 90 tablet 4  . atorvastatin (LIPITOR) 20 MG tablet TAKE 1 TABLET BY MOUTH  EVERY OTHER NIGHT. 45 tablet 3  . cholecalciferol (VITAMIN D3) 25 MCG (1000 UT) tablet Take 1,000 Units by mouth daily.    . clopidogrel (PLAVIX) 75 MG tablet Take 1 tablet (75 mg total) by mouth daily. 90 tablet 1  . famotidine (PEPCID) 20 MG tablet Take 1 tablet (20 mg total) by mouth 2 (two) times daily. 180 tablet 1  . fluticasone (FLONASE) 50 MCG/ACT nasal spray INSTILL 2 SPRAYS IN EACH  NOSTRIL EVERY DAY (Patient taking differently: Place 2 sprays into both nostrils as needed. INSTILL 2 SPRAYS IN EACH  NOSTRIL AS NEEDED) 48 g 4  . LINZESS 145 MCG CAPS capsule     . losartan (COZAAR) 25 MG tablet Take 1 tablet (25 mg total) by mouth daily. 90 tablet 1  . Multiple Vitamins-Minerals (ICAPS AREDS 2 PO) Take 1 capsule by mouth 2 (two) times daily at 10 AM and 5 PM. Morning and evening    . VIACTIV [phone] MG-UNT-MCG CHEW CHEW 1 TABLET BY MOUTH EVERY MORNING.  12   No facility-administered medications prior to visit.     Allergies  Allergen Reactions  . Benadryl [Diphenhydramine Hcl]     Hyper  . Doxycycline Other (See Comments)    acid reflux  . Simvastatin Other (See Comments)    REACTION: memory loss  . Fosamax [Alendronate Sodium] Other (See Comments)    GERD  . Lisinopril Other (See Comments)    Dry cough  . Nsaids Other (See Comments)    GI Bleed    ROS Review of Systems    Objective:    Physical Exam  Constitutional: She is oriented to person, place, and time. She appears well-developed and well-nourished.   HENT:  Head: Normocephalic and atraumatic.  Eyes: Conjunctivae and EOM are normal.  Cardiovascular: Normal rate.  Pulmonary/Chest: Effort normal.  Neurological: She is alert and oriented to person, place, and time.  Skin: Skin is dry. No pallor.  Psychiatric: She has a normal mood and affect. Her behavior is normal.  Vitals reviewed.   BP (!) 116/91   Pulse (!) 56   Temp 98 F (36.7 C) (Oral)   Ht _0  (1.702 m)   Wt 173 lb (78.5 kg)   BMI 27.10 kg/m  Wt Readings from Last 3 Encounters:  11/27/18 173 lb (78.5 kg)  11/15/18 171 lb (77.6 kg)  08/14/18 176 lb (79.8 kg)     There are no preventive care reminders to display for this patient.  There are no preventive care reminders to display for this patient.  Lab Results  Component Value Date   TSH 2.609 10/21/2013   Lab Results  Component Value Date   WBC 7.6 03/28/2018   HGB 15.0 08/01/2018   HCT 44.0 08/01/2018   MCV 101.1 (H) 03/28/2018   PLT 228 03/28/2018   Lab Results  Component Value Date   NA 139 11/15/2018   K 4.1 11/15/2018   CO2 29 11/15/2018   GLUCOSE 97 11/15/2018   BUN 14 11/15/2018   CREATININE 0.88 11/15/2018   BILITOT 0.6 11/15/2018   ALKPHOS 83 03/28/2018   AST 15 11/15/2018   ALT 8 11/15/2018   PROT 6.8 11/15/2018   ALBUMIN 3.8 03/28/2018   CALCIUM 10.2 11/15/2018   ANIONGAP 7 03/28/2018  GFR 71.90 09/07/2011   Lab Results  Component Value Date   CHOL 146 11/15/2018   Lab Results  Component Value Date   HDL 53 11/15/2018   Lab Results  Component Value Date   LDLCALC 73 11/15/2018   Lab Results  Component Value Date   TRIG 113 11/15/2018   Lab Results  Component Value Date   CHOLHDL 2.8 11/15/2018   No results found for: HGBA1C    Assessment & Plan:   Problem List Items Addressed This Visit    None    Visit Diagnoses    SOB (shortness of breath)    -  Primary   Relevant Medications   albuterol (PROVENTIL) (2.5 MG/3ML) 0.083% nebulizer solution   Other  Relevant Orders   PR EVAL OF BRONCHOSPASM     Spirometry today shows FVC of 103% with an FEV1 of 43% a ratio of 31.  Patient admits she was actually trying to hold back some of her breaths that she could breathe out longer instead of giving a burst of air at work.  Interestingly though post albuterol results were pretty consistent with her pretest results that she did have significant improvement in FEV1 of about 37%.  Indicative of possible asthmatic-like picture.  We discussed options including giving her albuterol inhaler to use as needed to see if this helps with any of her symptoms.  We also discussed follow-up in 6 months to repeat spirometry. Discussed results with patient and her daughter.    Meds ordered this encounter  Medications  . albuterol (PROVENTIL) (2.5 MG/3ML) 0.083% nebulizer solution    Sig: Take 3 mLs (2.5 mg total) by nebulization once for 1 dose.    Dispense:  3 mL    Refill:  0  . albuterol (VENTOLIN HFA) 108 (90 Base) MCG/ACT inhaler    Sig: Inhale 2 puffs into the lungs every 6 (six) hours as needed for wheezing or shortness of breath.    Dispense:  18 g    Refill:  1    Follow-up: Return if symptoms worsen or fail to improve.    Beatrice Lecher, MD

## 2018-11-27 NOTE — Progress Notes (Signed)
Patient presents to clinic for spirometry visit. Pre and post test needed. Pre and post test preformed. Copy of results on PCP desk. Patient tolerated breathing treatment well.

## 2018-11-27 NOTE — Addendum Note (Signed)
Addended by: Beatrice Lecher D on: 11/27/2018 05:24 PM   Modules accepted: Orders, Level of Service

## 2018-12-04 DIAGNOSIS — R29890 Loss of height: Secondary | ICD-10-CM | POA: Diagnosis not present

## 2018-12-04 DIAGNOSIS — M8589 Other specified disorders of bone density and structure, multiple sites: Secondary | ICD-10-CM | POA: Diagnosis not present

## 2018-12-04 DIAGNOSIS — Z853 Personal history of malignant neoplasm of breast: Secondary | ICD-10-CM | POA: Diagnosis not present

## 2018-12-04 LAB — HM DEXA SCAN

## 2018-12-04 LAB — HM MAMMOGRAPHY

## 2018-12-07 ENCOUNTER — Telehealth: Payer: Self-pay | Admitting: Family Medicine

## 2018-12-07 NOTE — Telephone Encounter (Signed)
Call patient: Bone density has changed slightly.  T score is now -2.0.  She still falls into the mildly thin category called osteopenia.  Continue with weightbearing exercise 5 days a week in addition to adequate calcium and vitamin D intake.  Recommend repeat bone density in 2 years.  I think she came off of her bone builder about 3 years ago.  Because she has had a change in bone density it is worth considering going ahead and restarting the medication.  I believe she was on Fosamax before.

## 2018-12-10 NOTE — Telephone Encounter (Signed)
Continue with the once daily Viactiv chew.  I think she would be a great candidate for Prolia if she would like to do it we scheduled here in our office with a nurse visit every 6 months for an injection.

## 2018-12-10 NOTE — Telephone Encounter (Signed)
Pt advised of recommendations. And would like to think about this and will call back to let us know when she would like to start.Michelle [name], [name], CMA

## 2018-12-10 NOTE — Telephone Encounter (Signed)
Pt advised of results and recommendations. She is currently taking 1,000 iu of Vit D and Viactiv chew 1 daily. Wanted to know if this should be increased? She states that she cannot take the Fosamax because of the way it made her feel.   She wanted to know about doing the injection that was previously discussed.   Will fwd to pcp for advice on the Vit D and Calcium supplement and also the Injectable.[name], [name], CMA

## 2018-12-10 NOTE — Telephone Encounter (Signed)
Pt stated that Dr. Jana Hakim suggested that she have the dexa done because of the Anastrozole that she takes and it can cause thinning of her bones.[name], [name], CMA

## 2018-12-13 ENCOUNTER — Encounter: Payer: Self-pay | Admitting: Family Medicine

## 2018-12-13 DIAGNOSIS — H353132 Nonexudative age-related macular degeneration, bilateral, intermediate dry stage: Secondary | ICD-10-CM | POA: Diagnosis not present

## 2018-12-21 ENCOUNTER — Other Ambulatory Visit: Payer: Self-pay | Admitting: Family Medicine

## 2018-12-25 DIAGNOSIS — C50911 Malignant neoplasm of unspecified site of right female breast: Secondary | ICD-10-CM | POA: Diagnosis not present

## 2019-01-03 DIAGNOSIS — Z85828 Personal history of other malignant neoplasm of skin: Secondary | ICD-10-CM | POA: Diagnosis not present

## 2019-01-03 DIAGNOSIS — L821 Other seborrheic keratosis: Secondary | ICD-10-CM | POA: Diagnosis not present

## 2019-01-03 DIAGNOSIS — C44722 Squamous cell carcinoma of skin of right lower limb, including hip: Secondary | ICD-10-CM | POA: Diagnosis not present

## 2019-01-03 DIAGNOSIS — L570 Actinic keratosis: Secondary | ICD-10-CM | POA: Diagnosis not present

## 2019-01-08 ENCOUNTER — Encounter: Payer: Self-pay | Admitting: *Deleted

## 2019-01-10 ENCOUNTER — Encounter: Payer: Self-pay | Admitting: Family Medicine

## 2019-01-12 DIAGNOSIS — H353132 Nonexudative age-related macular degeneration, bilateral, intermediate dry stage: Secondary | ICD-10-CM | POA: Diagnosis not present

## 2019-01-21 ENCOUNTER — Other Ambulatory Visit: Payer: Self-pay | Admitting: Cardiology

## 2019-01-21 DIAGNOSIS — Z20822 Contact with and (suspected) exposure to covid-19: Secondary | ICD-10-CM

## 2019-01-22 ENCOUNTER — Telehealth: Payer: Self-pay | Admitting: Critical Care Medicine

## 2019-01-22 ENCOUNTER — Telehealth: Payer: Self-pay | Admitting: Unknown Physician Specialty

## 2019-01-22 ENCOUNTER — Encounter: Payer: Self-pay | Admitting: Family Medicine

## 2019-01-22 ENCOUNTER — Ambulatory Visit (INDEPENDENT_AMBULATORY_CARE_PROVIDER_SITE_OTHER): Payer: Medicare Other | Admitting: Family Medicine

## 2019-01-22 VITALS — Ht 65.0 in

## 2019-01-22 DIAGNOSIS — J069 Acute upper respiratory infection, unspecified: Secondary | ICD-10-CM | POA: Diagnosis not present

## 2019-01-22 DIAGNOSIS — Z20828 Contact with and (suspected) exposure to other viral communicable diseases: Secondary | ICD-10-CM

## 2019-01-22 DIAGNOSIS — Z20822 Contact with and (suspected) exposure to covid-19: Secondary | ICD-10-CM

## 2019-01-22 LAB — NOVEL CORONAVIRUS, NAA: SARS-CoV-2, NAA: DETECTED — AB

## 2019-01-22 NOTE — Progress Notes (Signed)
Spoke w/pt she stated that she has been experiencing sxs x 1 week. She was tested for COVID on yesterday. She said that on last Wednesday she got chills. Some of her sxs have been fever (99.4 sometime last week, not able to check this now thermometer not working), runny nose, sneezing, body aches, slight cough, throat feels slightly dry,x 6 six days  She began taking Coricidin HBP but hasn't taken in a few days.   She stated that her sxs subsided some yesterday but around 4 or 5 pm she began to feel cold chills.   Denies any fevers now, no sweats, headaches, n/v/d, loss of appetite,taste or smell. She reports that she feels fatigued.

## 2019-01-22 NOTE — Progress Notes (Signed)
Virtual Visit via Telephone Note  I connected with [name] on 01/22/19 at 10:10 AM EST by telephone and verified that I am speaking with the correct person using two identifiers.   I discussed the limitations, risks, security and privacy concerns of performing an evaluation and management service by telephone and the availability of in person appointments. I also discussed with the patient that there may be a patient responsible charge related to this service. The patient expressed understanding and agreed to proceed.   Subjective:    CC: Sick x 1 week with URI sxs.    HPI: 85 year old female has been sick for x 1 week. She was tested for COVID on yesterday. She said that on last Wednesday she got chills. Some of her sxs have been fever (99.4 sometime last week, not able to check this now thermometer not working), runny nose, sneezing, body aches, slight cough, throat feels slightly dry x 6 six days  She began taking Coricidin HBP but hasn't taken in a few days.   She stated that her sxs subsided some yesterday but around 4 or 5 pm she began to feel cold chills. No diarrhea.  She was getting a little nervous about continuing with the Coricidin and wanted to make sure that it was safe.  Denies any fevers now, no sweats, headaches, n/v/d, loss of appetite, taste or smell. She reports that she feels fatigued.   Past medical history, Surgical history, Family history not pertinant except as noted below, Social history, Allergies, and medications have been entered into the medical record, reviewed, and corrections made.   Review of Systems: No fevers, chills, night sweats, weight loss, chest pain, or shortness of breath.   Objective:    General: Speaking clearly in complete sentences without any shortness of breath.  Alert and oriented x3.  Normal judgment. No apparent acute distress.    Impression and Recommendations:    URI -Covid test pending.  She should have the results back  within 24 to 48 hours.  If negative and symptoms persist then please call back and let us know after Christmas so that we can do additional work-up.  It sounds consistent with some type of viral illness.  Call if develops any new symptoms.  Continue symptomatic care.  Did encourage her to start weaning back on the use of the Coricidin.  I suspect that she is getting most of her relief from the Tylenol component of the Coricidin.  As she is just been taking it for the body aches and chills.    I discussed the assessment and treatment plan with the patient. The patient was provided an opportunity to ask questions and all were answered. The patient agreed with the plan and demonstrated an understanding of the instructions.   The patient was advised to call back or seek an in-person evaluation if the symptoms worsen or if the condition fails to improve as anticipated.  I provided 16 minutes of non-face-to-face time during this encounter.   Beatrice Lecher, MD

## 2019-01-22 NOTE — Telephone Encounter (Signed)
I just tried to contact this patient who is 85 years old and has significant comorbid conditions including aortic aneurysm hypertension previous stroke therefore would qualify for monoclonal antibody.  She came to testing event December 21 and is positive for Covid.  She had seen her primary care provider earlier and was diagnosed with viral URI symptoms.  We will attempt to see if we can get this patient in for monoclonal antibody treatment as I just tried to call and there was no answer we will call back again  Called to discuss with patient about Covid symptoms and the use of bamlanivimab, a monoclonal antibody infusion for those with mild to moderate Covid symptoms and at a high risk of hospitalization.  Pt is qualified for this infusion at the [facility] infusion center due to Age > 69, Age >55 and Hypertension   Message left to call back

## 2019-01-22 NOTE — Telephone Encounter (Signed)
Discussed with daughter. Symptoms of Covid for 10 days.  Discussed with daughter that patient no longer qualifies for monoclonal antibody treatment.    Discussed quarantine guidelines.

## 2019-01-22 NOTE — Progress Notes (Signed)
Patient called and is wanting to get an infusion for the COVID-19 virus at [facility]. She is aware that their office will give a call to her at the number listed. No other questions.

## 2019-01-22 NOTE — Telephone Encounter (Signed)
Mychart message left

## 2019-01-22 NOTE — Telephone Encounter (Signed)
I was able to call and contact patient. She is interested in the infusion. She states she has not received any messages.   Please contact patient at [phone] to get set up. She is expecting a call for this.   Thank you so much!!

## 2019-01-31 ENCOUNTER — Other Ambulatory Visit: Payer: Self-pay | Admitting: Family Medicine

## 2019-02-07 ENCOUNTER — Telehealth: Payer: Self-pay | Admitting: Family Medicine

## 2019-02-07 NOTE — Telephone Encounter (Signed)
Patient called and is wanting to know if you would recommend if she should get the covid-19 vaccine that will be available for her next week. She reports that she is having some occasional runny nose and a dry cough that comes and goes. Do you recommend the covid vaccine? Please advise.   She reports that she has had the virus too long and was not able to get set up  for the infusion of antibody testing.

## 2019-02-08 NOTE — Telephone Encounter (Signed)
Since she just had COVID I think she should wait 30 days before getting the vaccine.  But I do recommend that she get it.

## 2019-02-08 NOTE — Telephone Encounter (Signed)
Patient advised. Will scheduled for January 22 or 23.

## 2019-02-11 DIAGNOSIS — H353132 Nonexudative age-related macular degeneration, bilateral, intermediate dry stage: Secondary | ICD-10-CM | POA: Diagnosis not present

## 2019-02-13 ENCOUNTER — Encounter: Payer: Self-pay | Admitting: Family Medicine

## 2019-02-18 ENCOUNTER — Ambulatory Visit: Payer: Medicare Other | Attending: Internal Medicine

## 2019-02-18 DIAGNOSIS — Z20822 Contact with and (suspected) exposure to covid-19: Secondary | ICD-10-CM | POA: Diagnosis not present

## 2019-02-19 LAB — NOVEL CORONAVIRUS, NAA: SARS-CoV-2, NAA: NOT DETECTED

## 2019-02-22 ENCOUNTER — Other Ambulatory Visit: Payer: Self-pay | Admitting: Family Medicine

## 2019-02-28 ENCOUNTER — Ambulatory Visit: Payer: Medicare Other

## 2019-03-05 ENCOUNTER — Encounter: Payer: Self-pay | Admitting: Family Medicine

## 2019-03-08 ENCOUNTER — Ambulatory Visit: Payer: Medicare Other

## 2019-03-11 ENCOUNTER — Ambulatory Visit: Payer: Medicare Other

## 2019-03-13 DIAGNOSIS — H353132 Nonexudative age-related macular degeneration, bilateral, intermediate dry stage: Secondary | ICD-10-CM | POA: Diagnosis not present

## 2019-03-19 ENCOUNTER — Ambulatory Visit: Payer: Medicare Other | Admitting: Family Medicine

## 2019-03-19 ENCOUNTER — Encounter: Payer: Self-pay | Admitting: Family Medicine

## 2019-03-19 NOTE — Telephone Encounter (Signed)
Never heard of it. They can contact the health dept

## 2019-03-28 ENCOUNTER — Ambulatory Visit (INDEPENDENT_AMBULATORY_CARE_PROVIDER_SITE_OTHER): Payer: Medicare Other | Admitting: Family Medicine

## 2019-03-28 ENCOUNTER — Other Ambulatory Visit: Payer: Self-pay

## 2019-03-28 ENCOUNTER — Encounter: Payer: Self-pay | Admitting: Family Medicine

## 2019-03-28 VITALS — BP 119/66 | HR 78 | Ht 65.0 in | Wt 173.0 lb

## 2019-03-28 DIAGNOSIS — T8189XA Other complications of procedures, not elsewhere classified, initial encounter: Secondary | ICD-10-CM

## 2019-03-28 DIAGNOSIS — I714 Abdominal aortic aneurysm, without rupture, unspecified: Secondary | ICD-10-CM

## 2019-03-28 DIAGNOSIS — R05 Cough: Secondary | ICD-10-CM | POA: Diagnosis not present

## 2019-03-28 DIAGNOSIS — J849 Interstitial pulmonary disease, unspecified: Secondary | ICD-10-CM

## 2019-03-28 DIAGNOSIS — I10 Essential (primary) hypertension: Secondary | ICD-10-CM

## 2019-03-28 DIAGNOSIS — R209 Unspecified disturbances of skin sensation: Secondary | ICD-10-CM

## 2019-03-28 DIAGNOSIS — R059 Cough, unspecified: Secondary | ICD-10-CM

## 2019-03-28 DIAGNOSIS — R293 Abnormal posture: Secondary | ICD-10-CM

## 2019-03-28 HISTORY — DX: Interstitial pulmonary disease, unspecified: J84.9

## 2019-03-28 HISTORY — DX: Unspecified disturbances of skin sensation: R20.9

## 2019-03-28 NOTE — Patient Instructions (Signed)
We're due to check up on your aneurysm in November.

## 2019-03-28 NOTE — Progress Notes (Signed)
Established Patient Office Visit  Subjective:  Patient ID: Michelle [name], female    DOB: 05-18-1932  Age: 86 y.o. MRN: [id number]  CC:  Chief Complaint  Patient presents with  . Hypertension    HPI Michelle [name] presents for   Hypertension- Pt denies chest pain, SOB, dizziness, or heart palpitations.  Taking meds as directed w/o problems.  Denies medication side effects.    C/O of a cough x 1 mo that feels like she has some congestion on the left side of her chest. No sinus sinus. Had a chest xray  In October and showed chronic interstitial lung disease.     She hasn't been sleeping well ans she hasn't been irritable.  She says she usually goes to bed around 11 and wakes up around 2 AM and then can sometimes lay there for hours before she goes back to sleep but can take a Tylenol and actually will sleep great but then she snores when she takes the Tylenol.  Wants me to look at a biopsy site on her left lower leg.  Bx performed Dec 3rd.  She has been keeping it covered ever since then and applying some type of ointment she was not sure what the name of it was.  It still has not scabbed over and just wanted me to take a look at it.  She and her daughter who are here with her today are both concerned that her feet are turning blue at times.  She has but extensive varicose veins but her feet will turn very blue and feel cold.  This has been going on for a while.   Past Medical History:  Diagnosis Date  . AAA (abdominal aortic aneurysm) ([place]) 10/2013  . Anxiety   . Basal cell carcinoma 2011   R back  . Bell's palsy 1984  . Bradycardia   . Breast cancer ([place]) 02/2011   US-guided biopsy  . Cataract   . Cholecystitis 2004  . Complication of anesthesia   . Endometrial cancer ([place]) 05/2013   uterine ca  . GERD (gastroesophageal reflux disease)   . Hiatal hernia 2008  . History of breast cancer   . History of cerebrovascular accident 2009  . Hyperlipidemia   . Hypertension 2012  .  Macular degeneration   . Polyp of colon 2008, 2014   Benign  . PONV (postoperative nausea and vomiting)   . Shingles 2008  . Squamous cell carcinoma    2010-2015  . Squamous cell skin cancer, multiple sites 2014   recurrent  . Stroke ([place]) 09/2007   Slight memory problems residual  . Tendonitis    rt hand  . Vocal cord polyp     Past Surgical History:  Procedure Laterality Date  . APPENDECTOMY    . BASAL CELL CARCINOMA EXCISION    . biospy  10/28/09   Shave Biospy skin Left hand(Acitinic Keratoses), Right Upper Arm (superficial Basal Cell), Right Upper Back(Superficial Basal Cell), Left Neck ( Solar Lentigo & Seborrheic Keratoses)  . BREAST BIOPSY  02/11/11   Right Breast Needle Core Biospy - Upper Outer Quadrant; ER/PR 100%, Her-2 Neu neg.; Ki-67 10%  . BREAST LUMPECTOMY WITH RADIOACTIVE SEED LOCALIZATION Right 06/12/2018   Procedure: RIGHT BREAST LUMPECTOMY WITH RADIOACTIVE SEED LOCALIZATION;  Surgeon: Rolm Bookbinder, MD;  Location: [facility];  Service: General;  Laterality: Right;  . BREAST MAMMOSITE  03/14/2011   Procedure: MAMMOSITE BREAST;  Surgeon: Rolm Bookbinder, MD;  Location: [facility];  Service: General;  Laterality: Right;  . BREAST SURGERY     right breast lumpectomy snbx  . CHOLECYSTECTOMY  2004  . COLONOSCOPY    . COLONOSCOPY W/ POLYPECTOMY     2008, 2014 (benign)  . COLONOSCOPY WITH PROPOFOL N/A 07/09/2015   Procedure: COLONOSCOPY WITH PROPOFOL;  Surgeon: Wonda Horner, MD;  Location: [facility];  Service: Endoscopy;  Laterality: N/A;  . DILATION AND CURETTAGE OF UTERUS N/A 04/24/2013   Procedure: DILATATION AND CURETTAGE;  Surgeon: Guss Bunde, MD;  Location: [facility];  Service: Gynecology;  Laterality: N/A;  . ESOPHAGOGASTRODUODENOSCOPY N/A 07/06/2015   Procedure: ESOPHAGOGASTRODUODENOSCOPY (EGD);  Surgeon: Wilford Corner, MD;  Location: [facility] END[facility]COPY;  Service: Endoscopy;  Laterality: N/A;  . HYSTEROSCOPY N/A 04/24/2013    Procedure: HYSTEROSCOPY;  Surgeon: Guss Bunde, MD;  Location: [facility];  Service: Gynecology;  Laterality: N/A;  . ROBOTIC ASSISTED TOTAL HYSTERECTOMY WITH BILATERAL SALPINGO OOPHERECTOMY Bilateral 06/11/2013   Procedure: ROBOTIC ASSISTED TOTAL HYSTERECTOMY WITH BILATERAL SALPINGO OOPHORECTOMY WITH POSSIBLE STAGING, EPISIOTOMY;  Surgeon: Janie Morning, MD;  Location: [facility];  Service: Gynecology;  Laterality: Bilateral;  . SQUAMOUS CELL CARCINOMA EXCISION    . TEAR DUCT PROBING    . TONSILLECTOMY    . vocal cord poylps  1969   excision    Family History  Problem Relation Age of Onset  . Heart disease Father   . Cancer Sister 36       Lymphoma  . Breast cancer Sister   . Cancer Paternal Aunt        Breast cancer  . Cancer Daughter 29       Bilateral Breast Cancer  . Breast cancer Daughter 79  . Cancer Daughter        Breast Cancer  . Breast cancer Daughter   . Cancer Sister        2005 - Renal Cell Cancer and Breast Cancer  . Breast cancer Sister     Social History   Socioeconomic History  . Marital status: Married    Spouse name: Not on file  . Number of children: Not on file  . Years of education: Not on file  . Highest education level: Not on file  Occupational History  . Occupation: retired  Tobacco Use  . Smoking status: Former Smoker    Packs/day: 1.00    Years: 30.00    Pack years: 30.00    Quit date: 02/01/1996    Years since quitting: 23.1  . Smokeless tobacco: Never Used  Substance and Sexual Activity  . Alcohol use: Yes    Alcohol/week: 1.0 standard drinks    Types: 1 Glasses of wine per week    Comment: Ocassionaly once a year .  Marland Kitchen Drug use: No  . Sexual activity: Yes    Partners: Male    Comment: Menarche age 31, menopause age 69, HRT x 1year  Other Topics Concern  . Not on file  Social History Narrative  . Not on file   Social Determinants of Health   Financial Resource Strain:   . Difficulty of Paying Living Expenses: Not on file  Food  Insecurity:   . Worried About Charity fundraiser in the Last Year: Not on file  . Ran Out of Food in the Last Year: Not on file  Transportation Needs: No Transportation Needs  . Lack of Transportation (Medical): No  . Lack of Transportation (Non-Medical): No  Physical Activity:   . Days of  Exercise per Week: Not on file  . Minutes of Exercise per Session: Not on file  Stress:   . Feeling of Stress : Not on file  Social Connections:   . Frequency of Communication with Friends and Family: Not on file  . Frequency of Social Gatherings with Friends and Family: Not on file  . Attends Religious Services: Not on file  . Active Member of Clubs or Organizations: Not on file  . Attends Archivist Meetings: Not on file  . Marital Status: Not on file  Intimate Partner Violence:   . Fear of Current or Ex-Partner: Not on file  . Emotionally Abused: Not on file  . Physically Abused: Not on file  . Sexually Abused: Not on file    Outpatient Medications Prior to Visit  Medication Sig Dispense Refill  . acetaminophen (TYLENOL) 325 MG tablet Take 650 mg by mouth as needed for mild pain.     Marland Kitchen albuterol (VENTOLIN HFA) 108 (90 Base) MCG/ACT inhaler Inhale 2 puffs into the lungs every 6 (six) hours as needed for wheezing or shortness of breath. 18 g 1  . anastrozole (ARIMIDEX) 1 MG tablet Take 1 tablet (1 mg total) by mouth daily. 90 tablet 4  . atorvastatin (LIPITOR) 20 MG tablet TAKE 1 TABLET BY MOUTH  EVERY OTHER NIGHT. 45 tablet 3  . cholecalciferol (VITAMIN D3) 25 MCG (1000 UT) tablet Take 1,000 Units by mouth daily.    . clopidogrel (PLAVIX) 75 MG tablet TAKE 1 TABLET BY MOUTH  DAILY 90 tablet 3  . famotidine (PEPCID) 20 MG tablet TAKE 1 TABLET BY MOUTH  TWICE DAILY 180 tablet 3  . fluticasone (FLONASE) 50 MCG/ACT nasal spray INSTILL 2 SPRAYS IN EACH  NOSTRIL EVERY DAY (Patient taking differently: Place 2 sprays into both nostrils as needed. INSTILL 2 SPRAYS IN EACH  NOSTRIL AS NEEDED)  48 g 4  . losartan (COZAAR) 25 MG tablet TAKE 1 TABLET BY MOUTH  DAILY 90 tablet 3  . Multiple Vitamins-Minerals (ICAPS AREDS 2 PO) Take 1 capsule by mouth 2 (two) times daily at 10 AM and 5 PM. Morning and evening    . mupirocin ointment (BACTROBAN) 2 % Apply 1 application topically as directed.    Marland Kitchen VIACTIV 500-500-40 MG-UNT-MCG CHEW CHEW 1 TABLET BY MOUTH EVERY MORNING.  12  . albuterol (PROVENTIL) (2.5 MG/3ML) 0.083% nebulizer solution Take 3 mLs (2.5 mg total) by nebulization once for 1 dose. 3 mL 0  . LINZESS 145 MCG CAPS capsule      No facility-administered medications prior to visit.    Allergies  Allergen Reactions  . Benadryl [Diphenhydramine Hcl]     Hyper  . Doxycycline Other (See Comments)    acid reflux  . Simvastatin Other (See Comments)    REACTION: memory loss  . Fosamax [Alendronate Sodium] Other (See Comments)    GERD  . Lisinopril Other (See Comments)    Dry cough  . Nsaids Other (See Comments)    GI Bleed    ROS Review of Systems    Objective:    Physical Exam  Constitutional: She is oriented to person, place, and time. She appears well-developed and well-nourished.  HENT:  Head: Normocephalic and atraumatic.  Cardiovascular: Normal rate, regular rhythm and normal heart sounds.  Pulmonary/Chest: Effort normal and breath sounds normal.  Neurological: She is alert and oriented to person, place, and time.  Skin: Skin is warm and dry.  Since a varicose veins over the feet and  ankles.  She has some erythematous scaly lesions on both legs for which she follows a dermatologist.  Weak dorsal pedal pulses.  She definitely has a distinct blue color to her feet that is well demarcated just above the ankle.  On her right lower leg she did show me the biopsy wound.  Right now it has ointment on it and appears to be more moist but does not look infected and does not look like it is actively draining.  Psychiatric: She has a normal mood and affect. Her behavior is  normal.    BP 119/66   Pulse 78   Ht 5' 5"  (1.651 m)   Wt 173 lb (78.5 kg)   SpO2 97%   BMI 28.79 kg/m  Wt Readings from Last 3 Encounters:  03/28/19 173 lb (78.5 kg)  11/27/18 173 lb (78.5 kg)  11/15/18 171 lb (77.6 kg)     There are no preventive care reminders to display for this patient.  There are no preventive care reminders to display for this patient.  Lab Results  Component Value Date   TSH 2.609 10/21/2013   Lab Results  Component Value Date   WBC 7.6 03/28/2018   HGB 15.0 08/01/2018   HCT 44.0 08/01/2018   MCV 101.1 (H) 03/28/2018   PLT 228 03/28/2018   Lab Results  Component Value Date   NA 139 11/15/2018   K 4.1 11/15/2018   CO2 29 11/15/2018   GLUCOSE 97 11/15/2018   BUN 14 11/15/2018   CREATININE 0.88 11/15/2018   BILITOT 0.6 11/15/2018   ALKPHOS 83 03/28/2018   AST 15 11/15/2018   ALT 8 11/15/2018   PROT 6.8 11/15/2018   ALBUMIN 3.8 03/28/2018   CALCIUM 10.2 11/15/2018   ANIONGAP 7 03/28/2018   GFR 71.90 09/07/2011   Lab Results  Component Value Date   CHOL 146 11/15/2018   Lab Results  Component Value Date   HDL 53 11/15/2018   Lab Results  Component Value Date   LDLCALC 73 11/15/2018   Lab Results  Component Value Date   TRIG 113 11/15/2018   Lab Results  Component Value Date   CHOLHDL 2.8 11/15/2018   No results found for: HGBA1C    Assessment & Plan:   Problem List Items Addressed This Visit      Cardiovascular and Mediastinum   Essential hypertension, benign - Primary    Well-controlled.  Continue current regimen.      Abdominal aortic aneurysm (AAA) 3.0 cm to 5.0 cm in diameter in female [facility])     Respiratory   Interstitial lung disease ([place])    Chest imaging indicated interstitial lung disease.  She actually did spirometry a few months ago but admits she really did not put in her full effort so I do not feel confident about the values on the test so we discussed repeating that in 6 months which would be in  April so we will get that scheduled.  In the meantime keep an eye on her cough.        Other   Bilateral cold feet   Relevant Orders   [facility] [place] ABI WITH/WO TBI    Other Visit Diagnoses    Cough       Poor posture       Delayed surgical wound healing, initial encounter         Cough-it sounds like she just gets a very small amount of sputum in the morning and then once she  clears her chest she feels fine suspect this may really related to postnasal drip or may be a little bit of mucus in the bronchial tubes.  I really want her to work on some deep breathing exercises in the morning and explained how to do that in addition to trying her albuterol here there just to see if it is helpful or not.  If she feels like the cough worsens with the sputum increases then call me back and we will consider a trial of prednisone and antibiotics.  Billateral cold feet-we will get further work-up with ABIs.  She does have weak dorsal pedal pulses bilaterally.  Posture-discussed really catching herself and trying to straighten up her back and her shoulders and also working on some upper back exercises with the therapy bands.  She says she does have a set of bands at home so we will give her a set of upper back exercises to do on her own.  Surgical wound, delayed healing, initial encounter-really at this point I think she could discontinue wearing the bandage and just let it be open to the air but keep it really well moisturized with Vaseline twice a day and then it really should scab over at that point and heal.  Did encourage her to call her dermatologist because she has been keeping it covered for over 2 months at this point.  No orders of the defined types were placed in this encounter.   Follow-up: Return in about 2 months (around 05/26/2019) for spirometry.   Time spent 40 minutes in encounter, reviewing records and placing referrals.  Beatrice Lecher, MD

## 2019-03-28 NOTE — Assessment & Plan Note (Signed)
Chest imaging indicated interstitial lung disease.  She actually did spirometry a few months ago but admits she really did not put in her full effort so I do not feel confident about the values on the test so we discussed repeating that in 6 months which would be in April so we will get that scheduled.  In the meantime keep an eye on her cough.

## 2019-03-28 NOTE — Assessment & Plan Note (Signed)
Well-controlled.  Continue current regimen. 

## 2019-03-29 ENCOUNTER — Encounter: Payer: Self-pay | Admitting: Family Medicine

## 2019-04-01 ENCOUNTER — Encounter: Payer: Self-pay | Admitting: Family Medicine

## 2019-04-01 NOTE — Telephone Encounter (Signed)
Call [facility] lab to see if they have tried to call her?

## 2019-04-02 NOTE — Telephone Encounter (Signed)
Gave order to [place] to get scheduled - CF

## 2019-04-02 NOTE — Telephone Encounter (Signed)
Called [facility] at [phone] to get this scheduled today when [name] gave to me, their voicemail came on so I faxed the order to their office at [place] and requested they schedule with patient. I also noted all this in the order under pt desk top

## 2019-04-02 NOTE — Telephone Encounter (Signed)
Michelle [name] states she gave this to you, do you have any update for pt?

## 2019-04-03 ENCOUNTER — Ambulatory Visit (HOSPITAL_COMMUNITY)
Admission: RE | Admit: 2019-04-03 | Discharge: 2019-04-03 | Disposition: A | Discharge status: Home / Self Care | Payer: Medicare Other | Source: Ambulatory Visit | Attending: Family Medicine | Admitting: Family Medicine

## 2019-04-03 ENCOUNTER — Other Ambulatory Visit: Payer: Self-pay

## 2019-04-03 DIAGNOSIS — I10 Essential (primary) hypertension: Secondary | ICD-10-CM | POA: Diagnosis not present

## 2019-04-03 DIAGNOSIS — R209 Unspecified disturbances of skin sensation: Secondary | ICD-10-CM | POA: Insufficient documentation

## 2019-04-03 DIAGNOSIS — R230 Cyanosis: Secondary | ICD-10-CM

## 2019-04-03 DIAGNOSIS — E785 Hyperlipidemia, unspecified: Secondary | ICD-10-CM | POA: Diagnosis not present

## 2019-04-03 NOTE — Progress Notes (Signed)
ABI completed. Refer to "CV Proc" under chart review to view preliminary results.  04/03/2019 11:15 AM Kelby Aline., MHA, RVT, RDCS, RDMS

## 2019-04-04 ENCOUNTER — Encounter: Payer: Self-pay | Admitting: Family Medicine

## 2019-04-12 DIAGNOSIS — H353132 Nonexudative age-related macular degeneration, bilateral, intermediate dry stage: Secondary | ICD-10-CM | POA: Diagnosis not present

## 2019-04-16 ENCOUNTER — Encounter: Payer: Self-pay | Admitting: Family Medicine

## 2019-04-16 DIAGNOSIS — C44622 Squamous cell carcinoma of skin of right upper limb, including shoulder: Secondary | ICD-10-CM | POA: Diagnosis not present

## 2019-04-16 DIAGNOSIS — C44729 Squamous cell carcinoma of skin of left lower limb, including hip: Secondary | ICD-10-CM | POA: Diagnosis not present

## 2019-04-16 DIAGNOSIS — C44722 Squamous cell carcinoma of skin of right lower limb, including hip: Secondary | ICD-10-CM | POA: Diagnosis not present

## 2019-04-16 DIAGNOSIS — Z85828 Personal history of other malignant neoplasm of skin: Secondary | ICD-10-CM | POA: Diagnosis not present

## 2019-04-16 DIAGNOSIS — C44529 Squamous cell carcinoma of skin of other part of trunk: Secondary | ICD-10-CM | POA: Diagnosis not present

## 2019-04-16 DIAGNOSIS — L821 Other seborrheic keratosis: Secondary | ICD-10-CM | POA: Diagnosis not present

## 2019-04-16 DIAGNOSIS — C44629 Squamous cell carcinoma of skin of left upper limb, including shoulder: Secondary | ICD-10-CM | POA: Diagnosis not present

## 2019-05-06 ENCOUNTER — Ambulatory Visit: Payer: Medicare Other | Admitting: Oncology

## 2019-05-06 ENCOUNTER — Other Ambulatory Visit: Payer: Medicare Other

## 2019-05-10 DIAGNOSIS — L718 Other rosacea: Secondary | ICD-10-CM | POA: Diagnosis not present

## 2019-05-10 DIAGNOSIS — Z85828 Personal history of other malignant neoplasm of skin: Secondary | ICD-10-CM | POA: Diagnosis not present

## 2019-05-12 DIAGNOSIS — H353132 Nonexudative age-related macular degeneration, bilateral, intermediate dry stage: Secondary | ICD-10-CM | POA: Diagnosis not present

## 2019-05-27 ENCOUNTER — Other Ambulatory Visit: Payer: Medicare Other

## 2019-05-28 ENCOUNTER — Telehealth: Payer: Self-pay | Admitting: Oncology

## 2019-05-28 ENCOUNTER — Other Ambulatory Visit: Payer: Self-pay | Admitting: Oncology

## 2019-05-28 ENCOUNTER — Other Ambulatory Visit: Payer: Medicare Other

## 2019-05-28 NOTE — Telephone Encounter (Signed)
Scheduled appt per 4/27 sch message - pt aware of appt date and time

## 2019-05-30 ENCOUNTER — Other Ambulatory Visit: Payer: Self-pay

## 2019-05-30 ENCOUNTER — Ambulatory Visit (INDEPENDENT_AMBULATORY_CARE_PROVIDER_SITE_OTHER): Payer: Medicare Other | Admitting: Family Medicine

## 2019-05-30 ENCOUNTER — Encounter: Payer: Self-pay | Admitting: Family Medicine

## 2019-05-30 VITALS — BP 136/68 | HR 61 | Ht 65.0 in | Wt 169.0 lb

## 2019-05-30 DIAGNOSIS — R059 Cough, unspecified: Secondary | ICD-10-CM

## 2019-05-30 DIAGNOSIS — J849 Interstitial pulmonary disease, unspecified: Secondary | ICD-10-CM

## 2019-05-30 DIAGNOSIS — R05 Cough: Secondary | ICD-10-CM

## 2019-05-30 DIAGNOSIS — R0602 Shortness of breath: Secondary | ICD-10-CM

## 2019-05-30 MED ORDER — ALBUTEROL SULFATE HFA 108 (90 BASE) MCG/ACT IN AERS
2.0000 | INHALATION_SPRAY | Freq: Once | RESPIRATORY_TRACT | Status: AC
  Administered 2019-05-30: 15:00:00 2 via RESPIRATORY_TRACT

## 2019-05-30 NOTE — Assessment & Plan Note (Signed)
Spirometry today shows FVC of 122% with an FEV1 of 71%.  Ratio 43%.  FEV1 significantly improved from prior spirometry I felt like she had a little bit better technique that we did struggle still a little bit today.  Ratio is less than 70% indicating some obstruction especially since her capacity is actually really excellent.  Did have a 14% improvement in FEV1 after albuterol indicating an excellent response to albuterol.  We did discuss options of using her albuterol as needed.  She says she really has not used it since she was given the prescription.  It is okay to just sit and rest if she can catch her breath after a minute but if not but I really want her to try to use the albuterol or if she is going to be very highly active and I want her to consider a trial of pretreatment just to see if she notices improvement in her exercise capacity.

## 2019-05-30 NOTE — Progress Notes (Signed)
Established Patient Office Visit  Subjective:  Patient ID: Michelle [name], female    DOB: November 05, 1932  Age: 86 y.o. MRN: [id number]  CC:  No chief complaint on file.   HPI ERNESTYNE CALDWELL presents for, treated for follow-up shortness of breath and persistent cough.  Previously done a spirometry in our office but unfortunately because of poor technique I had asked her to come back and repeat it.  Past Medical History:  Diagnosis Date  . AAA (abdominal aortic aneurysm) ([place]) 10/2013  . Anxiety   . Basal cell carcinoma 2011   R back  . Bell's palsy 1984  . Bradycardia   . Breast cancer ([place]) 02/2011   US-guided biopsy  . Cataract   . Cholecystitis 2004  . Complication of anesthesia   . Endometrial cancer ([facility]) 05/2013   uterine ca  . GERD (gastroesophageal reflux disease)   . Hiatal hernia 2008  . History of breast cancer   . History of cerebrovascular accident 2009  . Hyperlipidemia   . Hypertension 2012  . Macular degeneration   . Polyp of colon 2008, 2014   Benign  . PONV (postoperative nausea and vomiting)   . Shingles 2008  . Squamous cell carcinoma    2010-2015  . Squamous cell skin cancer, multiple sites 2014   recurrent  . Stroke ([place]) 09/2007   Slight memory problems residual  . Tendonitis    rt hand  . Vocal cord polyp     Past Surgical History:  Procedure Laterality Date  . APPENDECTOMY    . BASAL CELL CARCINOMA EXCISION    . biospy  10/28/09   Shave Biospy skin Left hand(Acitinic Keratoses), Right Upper Arm (superficial Basal Cell), Right Upper Back(Superficial Basal Cell), Left Neck ( Solar Lentigo & Seborrheic Keratoses)  . BREAST BIOPSY  02/11/11   Right Breast Needle Core Biospy - Upper Outer Quadrant; ER/PR 100%, Her-2 Neu neg.; Ki-67 10%  . BREAST LUMPECTOMY WITH RADIOACTIVE SEED LOCALIZATION Right 06/12/2018   Procedure: RIGHT BREAST LUMPECTOMY WITH RADIOACTIVE SEED LOCALIZATION;  Surgeon: Rolm Bookbinder, MD;  Location: [facility];   Service: General;  Laterality: Right;  . BREAST MAMMOSITE  03/14/2011   Procedure: MAMMOSITE BREAST;  Surgeon: Rolm Bookbinder, MD;  Location: [facility];  Service: General;  Laterality: Right;  . BREAST SURGERY     right breast lumpectomy snbx  . CHOLECYSTECTOMY  2004  . COLONOSCOPY    . COLONOSCOPY W/ POLYPECTOMY     2008, 2014 (benign)  . COLONOSCOPY WITH PROPOFOL N/A 07/09/2015   Procedure: COLONOSCOPY WITH PROPOFOL;  Surgeon: Wonda Horner, MD;  Location: [facility] ENDOSCOPY;  Service: Endoscopy;  Laterality: N/A;  . DILATION AND CURETTAGE OF UTERUS N/A 04/24/2013   Procedure: DILATATION AND CURETTAGE;  Surgeon: Guss Bunde, MD;  Location: [facility];  Service: Gynecology;  Laterality: N/A;  . ESOPHAGOGASTRODUODENOSCOPY N/A 07/06/2015   Procedure: ESOPHAGOGASTRODUODENOSCOPY (EGD);  Surgeon: Wilford Corner, MD;  Location: [facility] ENDOSCOPY;  Service: Endoscopy;  Laterality: N/A;  . HYSTEROSCOPY N/A 04/24/2013   Procedure: HYSTEROSCOPY;  Surgeon: Guss Bunde, MD;  Location: [facility];  Service: Gynecology;  Laterality: N/A;  . ROBOTIC ASSISTED TOTAL HYSTERECTOMY WITH BILATERAL SALPINGO OOPHERECTOMY Bilateral 06/11/2013   Procedure: ROBOTIC ASSISTED TOTAL HYSTERECTOMY WITH BILATERAL SALPINGO OOPHORECTOMY WITH POSSIBLE STAGING, EPISIOTOMY;  Surgeon: Janie Morning, MD;  Location: [facility];  Service: Gynecology;  Laterality: Bilateral;  . SQUAMOUS CELL CARCINOMA EXCISION    . TEAR DUCT PROBING    .  TONSILLECTOMY    . vocal cord poylps  1969   excision    Family History  Problem Relation Age of Onset  . Heart disease Father   . Cancer Sister 36       Lymphoma  . Breast cancer Sister   . Cancer Paternal Aunt        Breast cancer  . Cancer Daughter 79       Bilateral Breast Cancer  . Breast cancer Daughter 26  . Cancer Daughter        Breast Cancer  . Breast cancer Daughter   . Cancer Sister        2005 - Renal Cell Cancer and Breast Cancer  . Breast cancer Sister     Social  History   Socioeconomic History  . Marital status: Married    Spouse name: Not on file  . Number of children: Not on file  . Years of education: Not on file  . Highest education level: Not on file  Occupational History  . Occupation: retired  Tobacco Use  . Smoking status: Former Smoker    Packs/day: 1.00    Years: 30.00    Pack years: 30.00    Quit date: 02/01/1996    Years since quitting: 23.3  . Smokeless tobacco: Never Used  Substance and Sexual Activity  . Alcohol use: Yes    Alcohol/week: 1.0 standard drinks    Types: 1 Glasses of wine per week    Comment: Ocassionaly once a year .  Marland Kitchen Drug use: No  . Sexual activity: Yes    Partners: Male    Comment: Menarche age 80, menopause age 53, HRT x 1year  Other Topics Concern  . Not on file  Social History Narrative  . Not on file   Social Determinants of Health   Financial Resource Strain:   . Difficulty of Paying Living Expenses:   Food Insecurity:   . Worried About Charity fundraiser in the Last Year:   . Arboriculturist in the Last Year:   Transportation Needs:   . Film/video editor (Medical):   Marland Kitchen Lack of Transportation (Non-Medical):   Physical Activity:   . Days of Exercise per Week:   . Minutes of Exercise per Session:   Stress:   . Feeling of Stress :   Social Connections:   . Frequency of Communication with Friends and Family:   . Frequency of Social Gatherings with Friends and Family:   . Attends Religious Services:   . Active Member of Clubs or Organizations:   . Attends Archivist Meetings:   Marland Kitchen Marital Status:   Intimate Partner Violence:   . Fear of Current or Ex-Partner:   . Emotionally Abused:   Marland Kitchen Physically Abused:   . Sexually Abused:     Outpatient Medications Prior to Visit  Medication Sig Dispense Refill  . acetaminophen (TYLENOL) 325 MG tablet Take 650 mg by mouth as needed for mild pain.     Marland Kitchen anastrozole (ARIMIDEX) 1 MG tablet TAKE 1 TABLET BY MOUTH  DAILY 90 tablet 3   . atorvastatin (LIPITOR) 20 MG tablet TAKE 1 TABLET BY MOUTH  EVERY OTHER NIGHT. 45 tablet 3  . cholecalciferol (VITAMIN D3) 25 MCG (1000 UT) tablet Take 1,000 Units by mouth daily.    . clopidogrel (PLAVIX) 75 MG tablet TAKE 1 TABLET BY MOUTH  DAILY 90 tablet 3  . famotidine (PEPCID) 20 MG tablet TAKE 1 TABLET BY MOUTH  TWICE DAILY 180 tablet 3  . fluticasone (FLONASE) 50 MCG/ACT nasal spray INSTILL 2 SPRAYS IN EACH  NOSTRIL EVERY DAY (Patient taking differently: Place 2 sprays into both nostrils as needed. INSTILL 2 SPRAYS IN EACH  NOSTRIL AS NEEDED) 48 g 4  . losartan (COZAAR) 25 MG tablet TAKE 1 TABLET BY MOUTH  DAILY 90 tablet 3  . Multiple Vitamins-Minerals (ICAPS AREDS 2 PO) Take 1 capsule by mouth 2 (two) times daily at 10 AM and 5 PM. Morning and evening    . mupirocin ointment (BACTROBAN) 2 % Apply 1 application topically as directed.    Marland Kitchen VIACTIV 500-500-40 MG-UNT-MCG CHEW CHEW 1 TABLET BY MOUTH EVERY MORNING.  12  . albuterol (PROVENTIL) (2.5 MG/3ML) 0.083% nebulizer solution Take 3 mLs (2.5 mg total) by nebulization once for 1 dose. (Patient not taking: Reported on 05/30/2019) 3 mL 0  . albuterol (VENTOLIN HFA) 108 (90 Base) MCG/ACT inhaler Inhale 2 puffs into the lungs every 6 (six) hours as needed for wheezing or shortness of breath. (Patient not taking: Reported on 05/30/2019) 18 g 1   No facility-administered medications prior to visit.    Allergies  Allergen Reactions  . Benadryl [Diphenhydramine Hcl]     Hyper  . Doxycycline Other (See Comments)    acid reflux  . Simvastatin Other (See Comments)    REACTION: memory loss  . Fosamax [Alendronate Sodium] Other (See Comments)    GERD  . Lisinopril Other (See Comments)    Dry cough  . Nsaids Other (See Comments)    GI Bleed    ROS Review of Systems    Objective:    Physical Exam  Constitutional: She is oriented to person, place, and time. She appears well-developed and well-nourished.  HENT:  Head: Normocephalic  and atraumatic.  Cardiovascular: Normal rate, regular rhythm and normal heart sounds.  Pulmonary/Chest: Effort normal and breath sounds normal.  Neurological: She is alert and oriented to person, place, and time.  Skin: Skin is warm and dry.  Psychiatric: She has a normal mood and affect. Her behavior is normal.    BP 136/68   Pulse 61   Ht 5' 5"  (1.651 m)   Wt 169 lb (76.7 kg)   SpO2 97%   BMI 28.12 kg/m  Wt Readings from Last 3 Encounters:  05/30/19 169 lb (76.7 kg)  03/28/19 173 lb (78.5 kg)  11/27/18 173 lb (78.5 kg)     There are no preventive care reminders to display for this patient.  There are no preventive care reminders to display for this patient.  Lab Results  Component Value Date   TSH 2.609 10/21/2013   Lab Results  Component Value Date   WBC 7.6 03/28/2018   HGB 15.0 08/01/2018   HCT 44.0 08/01/2018   MCV 101.1 (H) 03/28/2018   PLT 228 03/28/2018   Lab Results  Component Value Date   NA 139 11/15/2018   K 4.1 11/15/2018   CO2 29 11/15/2018   GLUCOSE 97 11/15/2018   BUN 14 11/15/2018   CREATININE 0.88 11/15/2018   BILITOT 0.6 11/15/2018   ALKPHOS 83 03/28/2018   AST 15 11/15/2018   ALT 8 11/15/2018   PROT 6.8 11/15/2018   ALBUMIN 3.8 03/28/2018   CALCIUM 10.2 11/15/2018   ANIONGAP 7 03/28/2018   GFR 71.90 09/07/2011   Lab Results  Component Value Date   CHOL 146 11/15/2018   Lab Results  Component Value Date   HDL 53 11/15/2018   Lab  Results  Component Value Date   LDLCALC 73 11/15/2018   Lab Results  Component Value Date   TRIG 113 11/15/2018   Lab Results  Component Value Date   CHOLHDL 2.8 11/15/2018   No results found for: HGBA1C    Assessment & Plan:   Problem List Items Addressed This Visit      Respiratory   Interstitial lung disease ([place])    Spirometry today shows FVC of 122% with an FEV1 of 71%.  Ratio 43%.  FEV1 significantly improved from prior spirometry I felt like she had a little bit better technique  that we did struggle still a little bit today.  Ratio is less than 70% indicating some obstruction especially since her capacity is actually really excellent.  Did have a 14% improvement in FEV1 after albuterol indicating an excellent response to albuterol.  We did discuss options of using her albuterol as needed.  She says she really has not used it since she was given the prescription.  It is okay to just sit and rest if she can catch her breath after a minute but if not but I really want her to try to use the albuterol or if she is going to be very highly active and I want her to consider a trial of pretreatment just to see if she notices improvement in her exercise capacity.      Relevant Orders   PR EVAL OF BRONCHOSPASM    Other Visit Diagnoses    Cough    -  Primary   Relevant Medications   albuterol (VENTOLIN HFA) 108 (90 Base) MCG/ACT inhaler 2 puff (Completed)   Other Relevant Orders   PR EVAL OF BRONCHOSPASM   SOB (shortness of breath)       Relevant Medications   albuterol (VENTOLIN HFA) 108 (90 Base) MCG/ACT inhaler 2 puff (Completed)   Other Relevant Orders   PR EVAL OF BRONCHOSPASM      Cough -She did want a let me know that her cough finally resolved.  She had a cough for several months after having had Covid in December she felt like there was a lot of phlegm and congestion particularly in her left lower lung.  She says it has finally resolved and she is feeling much better.  Meds ordered this encounter  Medications  . albuterol (VENTOLIN HFA) 108 (90 Base) MCG/ACT inhaler 2 puff    Follow-up: Return if symptoms worsen or fail to improve.    Beatrice Lecher, MD

## 2019-06-04 ENCOUNTER — Ambulatory Visit: Payer: Medicare Other | Admitting: Oncology

## 2019-06-04 ENCOUNTER — Other Ambulatory Visit: Payer: Medicare Other

## 2019-06-11 DIAGNOSIS — H353132 Nonexudative age-related macular degeneration, bilateral, intermediate dry stage: Secondary | ICD-10-CM | POA: Diagnosis not present

## 2019-06-18 ENCOUNTER — Encounter: Payer: Self-pay | Admitting: Oncology

## 2019-06-18 DIAGNOSIS — Z853 Personal history of malignant neoplasm of breast: Secondary | ICD-10-CM | POA: Diagnosis not present

## 2019-06-18 DIAGNOSIS — R928 Other abnormal and inconclusive findings on diagnostic imaging of breast: Secondary | ICD-10-CM | POA: Diagnosis not present

## 2019-06-20 ENCOUNTER — Other Ambulatory Visit: Payer: Self-pay

## 2019-06-20 ENCOUNTER — Inpatient Hospital Stay: Payer: Medicare Other | Attending: Oncology

## 2019-06-20 ENCOUNTER — Inpatient Hospital Stay: Payer: Medicare Other | Admitting: Oncology

## 2019-06-20 VITALS — BP 145/75 | HR 57 | Temp 98.0°F | Resp 18 | Ht 65.0 in | Wt 171.2 lb

## 2019-06-20 DIAGNOSIS — Z8542 Personal history of malignant neoplasm of other parts of uterus: Secondary | ICD-10-CM | POA: Diagnosis not present

## 2019-06-20 DIAGNOSIS — Z170 Estrogen receptor positive status [ER+]: Secondary | ICD-10-CM | POA: Diagnosis not present

## 2019-06-20 DIAGNOSIS — Z87891 Personal history of nicotine dependence: Secondary | ICD-10-CM | POA: Insufficient documentation

## 2019-06-20 DIAGNOSIS — C50411 Malignant neoplasm of upper-outer quadrant of right female breast: Secondary | ICD-10-CM | POA: Insufficient documentation

## 2019-06-20 DIAGNOSIS — Z90722 Acquired absence of ovaries, bilateral: Secondary | ICD-10-CM | POA: Diagnosis not present

## 2019-06-20 DIAGNOSIS — Z90710 Acquired absence of both cervix and uterus: Secondary | ICD-10-CM | POA: Diagnosis not present

## 2019-06-20 DIAGNOSIS — M8580 Other specified disorders of bone density and structure, unspecified site: Secondary | ICD-10-CM | POA: Diagnosis not present

## 2019-06-20 DIAGNOSIS — M818 Other osteoporosis without current pathological fracture: Secondary | ICD-10-CM

## 2019-06-20 DIAGNOSIS — Z171 Estrogen receptor negative status [ER-]: Secondary | ICD-10-CM | POA: Insufficient documentation

## 2019-06-20 DIAGNOSIS — M810 Age-related osteoporosis without current pathological fracture: Secondary | ICD-10-CM

## 2019-06-20 LAB — COMPREHENSIVE METABOLIC PANEL
ALT: 11 U/L (ref 0–44)
AST: 19 U/L (ref 15–41)
Albumin: 3.6 g/dL (ref 3.5–5.0)
Alkaline Phosphatase: 92 U/L (ref 38–126)
Anion gap: 5 (ref 5–15)
BUN: 9 mg/dL (ref 8–23)
CO2: 30 mmol/L (ref 22–32)
Calcium: 9.7 mg/dL (ref 8.9–10.3)
Chloride: 103 mmol/L (ref 98–111)
Creatinine, Ser: 0.94 mg/dL (ref 0.44–1.00)
GFR calc Af Amer: >60 mL/min (ref >60)
GFR calc non Af Amer: 56 mL/min — ABNORMAL LOW (ref >60)
Glucose, Bld: 99 mg/dL (ref 70–99)
Potassium: 5.1 mmol/L (ref 3.5–5.1)
Sodium: 138 mmol/L (ref 135–145)
Total Bilirubin: 0.4 mg/dL (ref 0.3–1.2)
Total Protein: 6.9 g/dL (ref 6.5–8.1)

## 2019-06-20 LAB — CBC WITH DIFFERENTIAL/PLATELET
Abs Immature Granulocytes: 0.01 10*3/uL (ref 0.00–0.07)
Basophils Absolute: 0.1 10*3/uL (ref 0.0–0.1)
Basophils Relative: 1 %
Eosinophils Absolute: 0 10*3/uL (ref 0.0–0.5)
Eosinophils Relative: 0 %
HCT: 37.4 % (ref 36.0–46.0)
Hemoglobin: 12.1 g/dL (ref 12.0–15.0)
Immature Granulocytes: 0 %
Lymphocytes Relative: 19 %
Lymphs Abs: 1.7 10*3/uL (ref 0.7–4.0)
MCH: 32.2 pg (ref 26.0–34.0)
MCHC: 32.4 g/dL (ref 30.0–36.0)
MCV: 99.5 fL (ref 80.0–100.0)
Monocytes Absolute: 0.8 10*3/uL (ref 0.1–1.0)
Monocytes Relative: 9 %
Neutro Abs: 6.7 10*3/uL (ref 1.7–7.7)
Neutrophils Relative %: 71 %
Platelets: 222 10*3/uL (ref 150–400)
RBC: 3.76 MIL/uL — ABNORMAL LOW (ref 3.87–5.11)
RDW: 12.3 % (ref 11.5–15.5)
WBC: 9.3 10*3/uL (ref 4.0–10.5)
nRBC: 0 % (ref 0.0–0.2)

## 2019-06-20 LAB — VITAMIN D 25 HYDROXY (VIT D DEFICIENCY, FRACTURES): Vit D, 25-Hydroxy: 28.62 ng/mL — ABNORMAL LOW (ref 30–100)

## 2019-06-20 NOTE — Progress Notes (Signed)
Michelle [name]  [phone] [phone]     ID: MISK GALENTINE DOB: May 07, 1932  MR#: [id number]  XTK#:[id number]  Patient Care Team: Hali Marry, MD as PCP - General (Family Medicine) Rolm Bookbinder, MD as Attending Physician (Dermatology) Monna Fam, MD as Attending Physician (Ophthalmology) Keneth Borg, [name], MD as Consulting Physician (Oncology) Rolm Bookbinder, MD as Consulting Physician (General Surgery) Kyung Rudd, MD as Consulting Physician (Radiation Oncology) Ronald Lobo, MD as Consulting Physician (Gastroenterology) Chauncey Cruel, MD OTHER MD:  CHIEF COMPLAINT: Recurrent estrogen receptor positive breast cancer  CURRENT TREATMENT: anastrozole   INTERVAL HISTORY: Michelle [name] returns today for follow up of her recurrent breast cancer.  She continues on anastrozole with good tolerance. She denies hot flashes and any issues with vaginal dryness. She doesn't pay anything for it.  Since her last visit, she underwent bilateral diagnostic mammography with tomography at [facility] on 12/04/2018 showing no evidence of malignancy in either breast   She underwent bone density screening the same day showing a T-score of -2.0, which is considered osteopenic.   REVIEW OF SYSTEMS: Michelle [name] received both doses of the majority of vaccine with some flu symptoms but no other complications.  She is doing her housework.  She does not take walks because she says there is too much traffic near where she lives.  Her husband has problems with macular degeneration and that does not help.  She was having some pain in the right breast, close to the nipple, but that has resolved.  A detailed review of systems today was otherwise stable.   HISTORY OF CURRENT ILLNESS: From the original intake note:  Michelle [name] has a history of right breast invasive ductal carcinoma status-post right lumpectomy with sentinel lymph node biopsy 03/14/2011 ([id number]), for a 0.52  cm, grade 1 invasive ductal carcinoma, both sentinel lymph nodes clear; margins negative; estrogen receptor 100% positive, progesterone receptor 100% positive, both with strong staining intensity, HER2 negative, with an MIB-1 of 10%. She underwent MammoSite therapy between 03/18/2011 and 03/25/2011.  Antiestrogen therapy was discussed with the patient by my former partner Dr. Chancy Milroy on 05/19/2011, with the patient declining.  Genetics testing for the BRCA 1 and 2 gene mutations was obtained 02/28/2011 and was negative.  She had routine screening mammography on 03/06/2018 showing a possible abnormality in the right breast. She proceeded to bilateral diagnostic mammography with tomography and right breast ultrasonography at [facility] on the same day showing: a 1.7 irregular, hypoechoic mass at the 11-12 o'clock areolar margin; one normal lymph node was seen.  Accordingly on 03/14/2018 she proceeded to biopsy of the right breast area in question. The pathology ([id number]) from this procedure showed: invasive ductal carcinoma, grade 2. Prognostic indicators significant for: estrogen receptor, 100% positive and progesterone receptor, 100% positive, both with strong staining intensity. Proliferation marker Ki67 at 10%. HER2 negative (1+) by immunohistochemistry.  Of note, she also has a personal history of uterine cancer status post total abdominal hysterectomy and bilateral salpingo-oophorectomy 06/11/2013 for an invasive endometrioid carcinoma of the uterus, grade 1, confined within the inner half of the endometrium.  A total of 3 left pelvic and 10 right pelvic lymph nodes were all negative for a pT1a pN0 stage.   She has also had multiple abscesses for skin cancer (basal cell and squamous cell).  The patient's subsequent history is as detailed below.   PAST MEDICAL HISTORY: Past Medical History:  Diagnosis Date  . AAA (abdominal aortic aneurysm) ([place]) 10/2013  .  Anxiety   . Basal cell carcinoma 2011   R  back  . Bell's palsy 1984  . Bradycardia   . Breast cancer ([place]) 02/2011   US-guided biopsy  . Cataract   . Cholecystitis 2004  . Complication of anesthesia   . Endometrial cancer ([place]) 05/2013   uterine ca  . GERD (gastroesophageal reflux disease)   . Hiatal hernia 2008  . History of breast cancer   . History of cerebrovascular accident 2009  . Hyperlipidemia   . Hypertension 2012  . Macular degeneration   . Polyp of colon 2008, 2014   Benign  . PONV (postoperative nausea and vomiting)   . Shingles 2008  . Squamous cell carcinoma    2010-2015  . Squamous cell skin cancer, multiple sites 2014   recurrent  . Stroke ([place]) 09/2007   Slight memory problems residual  . Tendonitis    rt hand  . Vocal cord polyp     PAST SURGICAL HISTORY: Past Surgical History:  Procedure Laterality Date  . APPENDECTOMY    . BASAL CELL CARCINOMA EXCISION    . biospy  10/28/09   Shave Biospy skin Left hand(Acitinic Keratoses), Right Upper Arm (superficial Basal Cell), Right Upper Back(Superficial Basal Cell), Left Neck ( Solar Lentigo & Seborrheic Keratoses)  . BREAST BIOPSY  02/11/11   Right Breast Needle Core Biospy - Upper Outer Quadrant; ER/PR 100%, Her-2 Neu neg.; Ki-67 10%  . BREAST LUMPECTOMY WITH RADIOACTIVE SEED LOCALIZATION Right 06/12/2018   Procedure: RIGHT BREAST LUMPECTOMY WITH RADIOACTIVE SEED LOCALIZATION;  Surgeon: Rolm Bookbinder, MD;  Location: [facility];  Service: General;  Laterality: Right;  . BREAST MAMMOSITE  03/14/2011   Procedure: MAMMOSITE BREAST;  Surgeon: Rolm Bookbinder, MD;  Location: [facility];  Service: General;  Laterality: Right;  . BREAST SURGERY     right breast lumpectomy snbx  . CHOLECYSTECTOMY  2004  . COLONOSCOPY    . COLONOSCOPY W/ POLYPECTOMY     2008, 2014 (benign)  . COLONOSCOPY WITH PROPOFOL N/A 07/09/2015   Procedure: COLONOSCOPY WITH PROPOFOL;  Surgeon: Wonda Horner, MD;  Location: [facility] ENDOSCOPY;  Service:  Endoscopy;  Laterality: N/A;  . DILATION AND CURETTAGE OF UTERUS N/A 04/24/2013   Procedure: DILATATION AND CURETTAGE;  Surgeon: Guss Bunde, MD;  Location: [facility];  Service: Gynecology;  Laterality: N/A;  . ESOPHAGOGASTRODUODENOSCOPY N/A 07/06/2015   Procedure: ESOPHAGOGASTRODUODENOSCOPY (EGD);  Surgeon: Wilford Corner, MD;  Location: [facility] ENDOSCOPY;  Service: Endoscopy;  Laterality: N/A;  . HYSTEROSCOPY N/A 04/24/2013   Procedure: HYSTEROSCOPY;  Surgeon: Guss Bunde, MD;  Location: [facility];  Service: Gynecology;  Laterality: N/A;  . ROBOTIC ASSISTED TOTAL HYSTERECTOMY WITH BILATERAL SALPINGO OOPHERECTOMY Bilateral 06/11/2013   Procedure: ROBOTIC ASSISTED TOTAL HYSTERECTOMY WITH BILATERAL SALPINGO OOPHORECTOMY WITH POSSIBLE STAGING, EPISIOTOMY;  Surgeon: Janie Morning, MD;  Location: [facility];  Service: Gynecology;  Laterality: Bilateral;  . SQUAMOUS CELL CARCINOMA EXCISION    . TEAR DUCT PROBING    . TONSILLECTOMY    . vocal cord poylps  1969   excision    FAMILY HISTORY Family History  Problem Relation Age of Onset  . Heart disease Father   . Cancer Sister 72       Lymphoma  . Breast cancer Sister   . Cancer Paternal Aunt        Breast cancer  . Cancer Daughter 82       Bilateral Breast Cancer  . Breast cancer Daughter 46  .  Cancer Daughter        Breast Cancer  . Breast cancer Daughter   . Cancer Sister        2005 - Renal Cell Cancer and Breast Cancer  . Breast cancer Sister   Patient father was 40 years old when he died from heart issues. Patient mother died from heart issues at age 23. Patient has 3 siblings, 1 brother and 2 sisters.  Patient notes a family hx of breast cancer in both sisters and both daughters. Patient's daughters have had extensive genetics evaluation with no deleterious mutation identified.    GYNECOLOGIC HISTORY:  No LMP recorded. Patient has had a hysterectomy. Menarche: 86 years old Age at first live birth: 86 years old Watson P 2 LMP 51, roughly  86 years old Contraceptive unknown HRT? No Hysterectomy? Yes, 06/11/2013 BSO? 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 She is married. Husband Mortimer Fries is a retired Airline pilot. They have 2 daughters, [name] and [place]. The patient attends [employer]. She and her husband share 2 grandchildren and 4 great-grandchildren.    ADVANCED DIRECTIVES: Husband Mikki Santee is her HCPOA.   HEALTH MAINTENANCE: Social History   Tobacco Use  . Smoking status: Former Smoker    Packs/day: 1.00    Years: 30.00    Pack years: 30.00    Quit date: 02/01/1996    Years since quitting: 23.3  . Smokeless tobacco: Never Used  Substance Use Topics  . Alcohol use: Yes    Alcohol/week: 1.0 standard drinks    Types: 1 Glasses of wine per week    Comment: Ocassionaly once a year .  Marland Kitchen Drug use: No     Colonoscopy: 07/09/2015, negative, Dr. Penelope Coop  PAP: 09/29/2014, negative  Bone density: 11/01/2016, T-score of -1.5   Allergies  Allergen Reactions  . Benadryl [Diphenhydramine Hcl]     Hyper  . Doxycycline Other (See Comments)    acid reflux  . Simvastatin Other (See Comments)    REACTION: memory loss  . Fosamax [Alendronate Sodium] Other (See Comments)    GERD  . Lisinopril Other (See Comments)    Dry cough  . Nsaids Other (See Comments)    GI Bleed    Current Outpatient Medications  Medication Sig Dispense Refill  . acetaminophen (TYLENOL) 325 MG tablet Take 650 mg by mouth as needed for mild pain.     Marland Kitchen albuterol (PROVENTIL) (2.5 MG/3ML) 0.083% nebulizer solution Take 3 mLs (2.5 mg total) by nebulization once for 1 dose. (Patient not taking: Reported on 05/30/2019) 3 mL 0  . albuterol (VENTOLIN HFA) 108 (90 Base) MCG/ACT inhaler Inhale 2 puffs into the lungs every 6 (six) hours as needed for wheezing or shortness of breath. (Patient not taking: Reported on 05/30/2019) 18 g 1  . anastrozole (ARIMIDEX) 1 MG tablet TAKE 1 TABLET BY  MOUTH  DAILY 90 tablet 3  . atorvastatin (LIPITOR) 20 MG tablet TAKE 1 TABLET BY MOUTH  EVERY OTHER NIGHT. 45 tablet 3  . cholecalciferol (VITAMIN D3) 25 MCG (1000 UT) tablet Take 1,000 Units by mouth daily.    . clopidogrel (PLAVIX) 75 MG tablet TAKE 1 TABLET BY MOUTH  DAILY 90 tablet 3  . famotidine (PEPCID) 20 MG tablet TAKE 1 TABLET BY MOUTH  TWICE DAILY 180 tablet 3  . fluticasone (FLONASE) 50 MCG/ACT nasal spray INSTILL 2 SPRAYS IN EACH  NOSTRIL EVERY DAY (Patient  taking differently: Place 2 sprays into both nostrils as needed. INSTILL 2 SPRAYS IN EACH  NOSTRIL AS NEEDED) 48 g 4  . losartan (COZAAR) 25 MG tablet TAKE 1 TABLET BY MOUTH  DAILY 90 tablet 3  . Multiple Vitamins-Minerals (ICAPS AREDS 2 PO) Take 1 capsule by mouth 2 (two) times daily at 10 AM and 5 PM. Morning and evening    . mupirocin ointment (BACTROBAN) 2 % Apply 1 application topically as directed.    Marland Kitchen VIACTIV [phone] MG-UNT-MCG CHEW CHEW 1 TABLET BY MOUTH EVERY MORNING.  12   No current facility-administered medications for this visit.    OBJECTIVE:  white woman in no acute distress  Vitals:   06/20/19 1450  BP: (!) 145/75  Pulse: (!) 57  Resp: 18  Temp: 98 F (36.7 C)  SpO2: 97%     Body mass index is 28.49 kg/m.   Wt Readings from Last 3 Encounters:  06/20/19 171 lb 3.2 oz (77.7 kg)  05/30/19 169 lb (76.7 kg)  03/28/19 173 lb (78.5 kg)      ECOG FS:1 - Symptomatic but completely ambulatory  Sclerae unicteric, EOMs intact Wearing a mask No cervical or supraclavicular adenopathy Lungs no rales or rhonchi Heart regular rate and rhythm Abd soft, nontender, positive bowel sounds MSK no focal spinal tenderness, no upper extremity lymphedema Neuro: nonfocal, well oriented, appropriate affect Breasts: The right breast is status post lumpectomy.  There is no abnormality near the nipple where she was experiencing some pain recently.  There is a seroma or hematoma palpable in the upper aspect of the breast,  not associated with tenderness or erythema.  Left breast is benign.  Both axillae are benign.   LAB RESULTS:  CMP     Component Value Date/Time   NA 138 06/20/2019 1441   K 5.1 06/20/2019 1441   CL 103 06/20/2019 1441   CO2 30 06/20/2019 1441   GLUCOSE 99 06/20/2019 1441   BUN 9 06/20/2019 1441   BUN 12.3 11/01/2013 1031   CREATININE 0.94 06/20/2019 1441   CREATININE 0.88 11/15/2018 1548   CREATININE 0.9 11/01/2013 1031   CALCIUM 9.7 06/20/2019 1441   PROT 6.9 06/20/2019 1441   ALBUMIN 3.6 06/20/2019 1441   AST 19 06/20/2019 1441   AST 18 03/28/2018 1555   ALT 11 06/20/2019 1441   ALT 10 03/28/2018 1555   ALKPHOS 92 06/20/2019 1441   BILITOT 0.4 06/20/2019 1441   BILITOT 0.4 03/28/2018 1555   GFRNONAA 56 (L) 06/20/2019 1441   GFRNONAA 61 11/15/2018 1548   GFRAA >60 06/20/2019 1441   GFRAA 70 11/15/2018 1548    No results found for: TOTALPROTELP, ALBUMINELP, A1GS, A2GS, BETS, BETA2SER, GAMS, MSPIKE, SPEI  No results found for: KPAFRELGTCHN, LAMBDASER, KAPLAMBRATIO  Lab Results  Component Value Date   WBC 9.3 06/20/2019   NEUTROABS 6.7 06/20/2019   HGB 12.1 06/20/2019   HCT 37.4 06/20/2019   MCV 99.5 06/20/2019   PLT 222 06/20/2019   Lab Results  Component Value Date   LABCA2 77 (H) 03/08/2011    No components found for: [id number]  No results for input(s): INR in the last 168 hours.  Lab Results  Component Value Date   LABCA2 77 (H) 03/08/2011    No results found for: YNW295  No results found for: AOZ308  No results found for: MVH846  No results found for: CA2729  No components found for: HGQUANT  No results found for: CEA1 / No results found for:  CEA1   No results found for: AFPTUMOR  No results found for: CHROMOGRNA  No results found for: HGBA, HGBA2QUANT, HGBFQUANT, HGBSQUAN (Hemoglobinopathy evaluation)   Lab Results  Component Value Date   LDH 131 07/04/2015    Lab Results  Component Value Date   IRON 179 (H) 07/04/2015   TIBC  249 (L) 07/04/2015   IRONPCTSAT 72 (H) 07/04/2015   (Iron and TIBC)  Lab Results  Component Value Date   FERRITIN 56 11/10/2015    Urinalysis    Component Value Date/Time   COLORURINE YELLOW 10/21/2013 [place] 10/21/2013 1645   LABSPEC 1.013 10/21/2013 1645   PHURINE 5.5 10/21/2013 1645   GLUCOSEU NEG 10/21/2013 1645   HGBUR NEG 10/21/2013 1645   BILIRUBINUR NEG 10/21/2013 1645   KETONESUR NEG 10/21/2013 1645   PROTEINUR NEG 10/21/2013 1645   UROBILINOGEN 0.2 10/21/2013 1645   NITRITE NEG 10/21/2013 1645   LEUKOCYTESUR TRACE (A) 10/21/2013 1645    STUDIES: No results found.   ELIGIBLE FOR AVAILABLE RESEARCH PROTOCOL: no   ASSESSMENT: 86 y.o. [place] woman  (1) status post right upper outer quadrant lumpectomy 03/14/2011 for a pT1b (0.52 cm), pN0 invasive ductal carcinoma, grade 1, estrogen and progesterone receptor positive, HER-2 nonamplified, with an MIB-1 of 10%; a total of 2 axillary lymph nodes were removed.  (a) status post MammoSite radiation between 03/17/2018 and 03/24/2018  (b) opted against adjuvant antiestrogens  (2) status post total abdominal hysterectomy and bilateral salpingo-oophorectomy 06/11/2013 for a pT1a pN0 invasive endometrioid carcinoma, FIGO grade 1, a total of 3 left and 10 right pelvic lymph nodes all clear  (3) status post right breast upper outer quadrant biopsy 03/14/2018 for a clinical T1c N0 invasive ductal carcinoma, grade 2, strongly estrogen and progesterone receptor positive, HER-2 nonamplified, with an MIB-1 of 10%.  (4) status post right lumpectomy 06/12/2018 for an invasive ductal carcinoma, T1c, NX, grade 2, with the final resection margins negative   (5) anastrozole started 04/01/2018  (a) bone density 12/04/2018 shows a T score of -2.0  (6) opted against adjuvant radiation   PLAN: Lanesha is now a year out from definitive surgery for her breast cancer with no evidence of disease recurrence.  This is very  favorable.  She is tolerating anastrozole well and the plan is to continue that a total of 5 years.  We have discussed denosumab/Prolia extensively.  She has also done quite a bit of reading regarding it.  She is scared of the possible complications and has decided not to proceed with it.  She is taking vitamin D and I have encouraged her to walk a minimum of 30 minutes a minimum of 5 days a week.  It may be that she and her husband need to drive to a park were it is fairly flat and there is no traffic.  Otherwise she will return to see me in a year.  She knows to call for any issue that may develop before that visit.  Total encounter time 25 minutes.Chauncey Cruel, MD   06/20/2019 6:14 PM Medical Oncology and Hematology [facility] [place], [place] [address] Tel. [phone]    Fax. [phone]   I, Wilburn Mylar, am acting as scribe for Dr. [name]. Tiffny Gemmer.  I, Lurline Del MD, have reviewed the above documentation for accuracy and completeness, and I agree with the above.    *Total Encounter Time as defined by the Centers for Medicare  and Medicaid Services includes, in addition to the face-to-face time of a patient visit (documented in the note above) non-face-to-face time: obtaining and reviewing outside history, ordering and reviewing medications, tests or procedures, care coordination (communications with other health care professionals or caregivers) and documentation in the medical record.

## 2019-06-21 ENCOUNTER — Telehealth: Payer: Self-pay | Admitting: Oncology

## 2019-06-21 NOTE — Telephone Encounter (Signed)
Scheduled appts per 5/20 los. Pt confirmed appt date and time.  

## 2019-06-24 ENCOUNTER — Encounter: Payer: Self-pay | Admitting: Oncology

## 2019-06-24 ENCOUNTER — Other Ambulatory Visit: Payer: Self-pay | Admitting: Oncology

## 2019-06-24 DIAGNOSIS — M816 Localized osteoporosis [Lequesne]: Secondary | ICD-10-CM

## 2019-06-24 DIAGNOSIS — C44320 Squamous cell carcinoma of skin of unspecified parts of face: Secondary | ICD-10-CM

## 2019-07-11 DIAGNOSIS — H353132 Nonexudative age-related macular degeneration, bilateral, intermediate dry stage: Secondary | ICD-10-CM | POA: Diagnosis not present

## 2019-07-23 DIAGNOSIS — Z85828 Personal history of other malignant neoplasm of skin: Secondary | ICD-10-CM | POA: Diagnosis not present

## 2019-07-23 DIAGNOSIS — L570 Actinic keratosis: Secondary | ICD-10-CM | POA: Diagnosis not present

## 2019-07-23 DIAGNOSIS — L821 Other seborrheic keratosis: Secondary | ICD-10-CM | POA: Diagnosis not present

## 2019-07-26 ENCOUNTER — Ambulatory Visit (INDEPENDENT_AMBULATORY_CARE_PROVIDER_SITE_OTHER): Payer: Medicare Other | Admitting: Family Medicine

## 2019-07-26 ENCOUNTER — Encounter: Payer: Self-pay | Admitting: Family Medicine

## 2019-07-26 ENCOUNTER — Other Ambulatory Visit: Payer: Self-pay

## 2019-07-26 VITALS — BP 118/50 | HR 59 | Ht 65.0 in | Wt 172.0 lb

## 2019-07-26 DIAGNOSIS — I10 Essential (primary) hypertension: Secondary | ICD-10-CM

## 2019-07-26 DIAGNOSIS — J849 Interstitial pulmonary disease, unspecified: Secondary | ICD-10-CM | POA: Diagnosis not present

## 2019-07-26 DIAGNOSIS — I714 Abdominal aortic aneurysm, without rupture, unspecified: Secondary | ICD-10-CM

## 2019-07-26 NOTE — Assessment & Plan Note (Signed)
Interstitial lung disease.  Discussed how to properly use an albuterol MDI inhaler.  Encouraged her to maybe give it a try especially if she is going to start walking may be doing 2 puffs before her walk to see if she notices a difference.  We discussed strategies around getting more exercise including silver sneakers and maybe even walking at a local park since she does not have a safe place to walk near her home.

## 2019-07-26 NOTE — Progress Notes (Signed)
Established Patient Office Visit  Subjective:  Patient ID: Michelle [name], female    DOB: 04-07-1932  Age: 86 y.o. MRN: [id number]  CC:  Chief Complaint  Patient presents with  . Hypertension    HPI Michelle [name] presents for   Hypertension- Pt denies chest pain, SOB, dizziness, or heart palpitations.  Taking meds as directed w/o problems.  Denies medication side effects.  Pressure was a little elevated at last office visit but she had reported that home blood pressures have been good.  She says lately she has not been checking it quite as consistently.  Chronic cough with occasional shortness of breath-we discussed trying the albuterol but she said she has never done so since I last saw her.  She says she knows she really needs to start walking regularly.  Recent chest x-ray in October confirmed increasing chronic interstitial lung disease.    Past Medical History:  Diagnosis Date  . AAA (abdominal aortic aneurysm) ([place]) 10/2013  . Anxiety   . Basal cell carcinoma 2011   R back  . Bell's palsy 1984  . Bradycardia   . Breast cancer ([place]) 02/2011   US-guided biopsy  . Cataract   . Cholecystitis 2004  . Complication of anesthesia   . Endometrial cancer ([place]) 05/2013   uterine ca  . GERD (gastroesophageal reflux disease)   . Hiatal hernia 2008  . History of breast cancer   . History of cerebrovascular accident 2009  . Hyperlipidemia   . Hypertension 2012  . Macular degeneration   . Polyp of colon 2008, 2014   Benign  . PONV (postoperative nausea and vomiting)   . Shingles 2008  . Squamous cell carcinoma    2010-2015  . Squamous cell skin cancer, multiple sites 2014   recurrent  . Stroke ([place]) 09/2007   Slight memory problems residual  . Tendonitis    rt hand  . Vocal cord polyp     Past Surgical History:  Procedure Laterality Date  . APPENDECTOMY    . BASAL CELL CARCINOMA EXCISION    . biospy  10/28/09   Shave Biospy skin Left hand(Acitinic Keratoses), Right  Upper Arm (superficial Basal Cell), Right Upper Back(Superficial Basal Cell), Left Neck ( Solar Lentigo & Seborrheic Keratoses)  . BREAST BIOPSY  02/11/11   Right Breast Needle Core Biospy - Upper Outer Quadrant; ER/PR 100%, Her-2 Neu neg.; Ki-67 10%  . BREAST LUMPECTOMY WITH RADIOACTIVE SEED LOCALIZATION Right 06/12/2018   Procedure: RIGHT BREAST LUMPECTOMY WITH RADIOACTIVE SEED LOCALIZATION;  Surgeon: Rolm Bookbinder, MD;  Location: [facility];  Service: General;  Laterality: Right;  . BREAST MAMMOSITE  03/14/2011   Procedure: MAMMOSITE BREAST;  Surgeon: Rolm Bookbinder, MD;  Location: [facility];  Service: General;  Laterality: Right;  . BREAST SURGERY     right breast lumpectomy snbx  . CHOLECYSTECTOMY  2004  . COLONOSCOPY    . COLONOSCOPY W/ POLYPECTOMY     2008, 2014 (benign)  . COLONOSCOPY WITH PROPOFOL N/A 07/09/2015   Procedure: COLONOSCOPY WITH PROPOFOL;  Surgeon: Wonda Horner, MD;  Location: [facility] ENDOSCOPY;  Service: Endoscopy;  Laterality: N/A;  . DILATION AND CURETTAGE OF UTERUS N/A 04/24/2013   Procedure: DILATATION AND CURETTAGE;  Surgeon: Guss Bunde, MD;  Location: [facility];  Service: Gynecology;  Laterality: N/A;  . ESOPHAGOGASTRODUODENOSCOPY N/A 07/06/2015   Procedure: ESOPHAGOGASTRODUODENOSCOPY (EGD);  Surgeon: Wilford Corner, MD;  Location: [facility] ENDOSCOPY;  Service: Endoscopy;  Laterality: N/A;  .  HYSTEROSCOPY N/A 04/24/2013   Procedure: HYSTEROSCOPY;  Surgeon: Guss Bunde, MD;  Location: [facility];  Service: Gynecology;  Laterality: N/A;  . ROBOTIC ASSISTED TOTAL HYSTERECTOMY WITH BILATERAL SALPINGO OOPHERECTOMY Bilateral 06/11/2013   Procedure: ROBOTIC ASSISTED TOTAL HYSTERECTOMY WITH BILATERAL SALPINGO OOPHORECTOMY WITH POSSIBLE STAGING, EPISIOTOMY;  Surgeon: Janie Morning, MD;  Location: [facility];  Service: Gynecology;  Laterality: Bilateral;  . SQUAMOUS CELL CARCINOMA EXCISION    . TEAR DUCT PROBING    . TONSILLECTOMY    . vocal cord  poylps  1969   excision    Family History  Problem Relation Age of Onset  . Heart disease Father   . Cancer Sister 71       Lymphoma  . Breast cancer Sister   . Cancer Paternal Aunt        Breast cancer  . Cancer Daughter 83       Bilateral Breast Cancer  . Breast cancer Daughter 48  . Cancer Daughter        Breast Cancer  . Breast cancer Daughter   . Cancer Sister        2005 - Renal Cell Cancer and Breast Cancer  . Breast cancer Sister     Social History   Socioeconomic History  . Marital status: Married    Spouse name: Not on file  . Number of children: Not on file  . Years of education: Not on file  . Highest education level: Not on file  Occupational History  . Occupation: retired  Tobacco Use  . Smoking status: Former Smoker    Packs/day: 1.00    Years: 30.00    Pack years: 30.00    Quit date: 02/01/1996    Years since quitting: 23.4  . Smokeless tobacco: Never Used  Vaping Use  . Vaping Use: Never used  Substance and Sexual Activity  . Alcohol use: Yes    Alcohol/week: 1.0 standard drink    Types: 1 Glasses of wine per week    Comment: Ocassionaly once a year .  Marland Kitchen Drug use: No  . Sexual activity: Yes    Partners: Male    Comment: Menarche age 19, menopause age 34, HRT x 1year  Other Topics Concern  . Not on file  Social History Narrative  . Not on file   Social Determinants of Health   Financial Resource Strain:   . Difficulty of Paying Living Expenses:   Food Insecurity:   . Worried About Charity fundraiser in the Last Year:   . Arboriculturist in the Last Year:   Transportation Needs:   . Film/video editor (Medical):   Marland Kitchen Lack of Transportation (Non-Medical):   Physical Activity:   . Days of Exercise per Week:   . Minutes of Exercise per Session:   Stress:   . Feeling of Stress :   Social Connections:   . Frequency of Communication with Friends and Family:   . Frequency of Social Gatherings with Friends and Family:   . Attends  Religious Services:   . Active Member of Clubs or Organizations:   . Attends Archivist Meetings:   Marland Kitchen Marital Status:   Intimate Partner Violence:   . Fear of Current or Ex-Partner:   . Emotionally Abused:   Marland Kitchen Physically Abused:   . Sexually Abused:     Outpatient Medications Prior to Visit  Medication Sig Dispense Refill  . acetaminophen (TYLENOL) 325 MG tablet Take 650 mg by mouth  as needed for mild pain.     Marland Kitchen albuterol (VENTOLIN HFA) 108 (90 Base) MCG/ACT inhaler Inhale 2 puffs into the lungs every 6 (six) hours as needed for wheezing or shortness of breath. 18 g 1  . anastrozole (ARIMIDEX) 1 MG tablet TAKE 1 TABLET BY MOUTH  DAILY 90 tablet 3  . atorvastatin (LIPITOR) 20 MG tablet TAKE 1 TABLET BY MOUTH  EVERY OTHER NIGHT. 45 tablet 3  . cholecalciferol (VITAMIN D3) 25 MCG (1000 UT) tablet Take 1,000 Units by mouth daily.    . clopidogrel (PLAVIX) 75 MG tablet TAKE 1 TABLET BY MOUTH  DAILY 90 tablet 3  . famotidine (PEPCID) 20 MG tablet TAKE 1 TABLET BY MOUTH  TWICE DAILY 180 tablet 3  . fluticasone (FLONASE) 50 MCG/ACT nasal spray INSTILL 2 SPRAYS IN EACH  NOSTRIL EVERY DAY (Patient taking differently: Place 2 sprays into both nostrils as needed. INSTILL 2 SPRAYS IN EACH  NOSTRIL AS NEEDED) 48 g 4  . losartan (COZAAR) 25 MG tablet TAKE 1 TABLET BY MOUTH  DAILY 90 tablet 3  . Multiple Vitamins-Minerals (ICAPS AREDS 2 PO) Take 1 capsule by mouth 2 (two) times daily at 10 AM and 5 PM. Morning and evening    . VIACTIV 500-500-40 MG-UNT-MCG CHEW CHEW 1 TABLET BY MOUTH EVERY MORNING.  12  . albuterol (PROVENTIL) (2.5 MG/3ML) 0.083% nebulizer solution Take 3 mLs (2.5 mg total) by nebulization once for 1 dose. (Patient not taking: Reported on 05/30/2019) 3 mL 0  . mupirocin ointment (BACTROBAN) 2 % Apply 1 application topically as directed.     No facility-administered medications prior to visit.    Allergies  Allergen Reactions  . Benadryl [Diphenhydramine Hcl]     Hyper  .  Doxycycline Other (See Comments)    acid reflux  . Simvastatin Other (See Comments)    REACTION: memory loss  . Fosamax [Alendronate Sodium] Other (See Comments)    GERD  . Lisinopril Other (See Comments)    Dry cough  . Nsaids Other (See Comments)    GI Bleed    ROS Review of Systems    Objective:    Physical Exam Constitutional:      Appearance: She is well-developed.  HENT:     Head: Normocephalic and atraumatic.  Cardiovascular:     Rate and Rhythm: Normal rate and regular rhythm.     Heart sounds: Normal heart sounds.  Pulmonary:     Effort: Pulmonary effort is normal.     Breath sounds: Normal breath sounds.  Skin:    General: Skin is warm and dry.  Neurological:     Mental Status: She is alert and oriented to person, place, and time.  Psychiatric:        Behavior: Behavior normal.     BP (!) 118/50   Pulse (!) 59   Ht _0  (1.651 m)   Wt 172 lb (78 kg)   SpO2 99%   BMI 28.62 kg/m  Wt Readings from Last 3 Encounters:  07/26/19 172 lb (78 kg)  06/20/19 171 lb 3.2 oz (77.7 kg)  05/30/19 169 lb (76.7 kg)     There are no preventive care reminders to display for this patient.  There are no preventive care reminders to display for this patient.  Lab Results  Component Value Date   TSH 2.609 10/21/2013   Lab Results  Component Value Date   WBC 9.3 06/20/2019   HGB 12.1 06/20/2019   HCT 37.4 06/20/2019  MCV 99.5 06/20/2019   PLT 222 06/20/2019   Lab Results  Component Value Date   NA 138 06/20/2019   K 5.1 06/20/2019   CO2 30 06/20/2019   GLUCOSE 99 06/20/2019   BUN 9 06/20/2019   CREATININE 0.94 06/20/2019   BILITOT 0.4 06/20/2019   ALKPHOS 92 06/20/2019   AST 19 06/20/2019   ALT 11 06/20/2019   PROT 6.9 06/20/2019   ALBUMIN 3.6 06/20/2019   CALCIUM 9.7 06/20/2019   ANIONGAP 5 06/20/2019   GFR 71.90 09/07/2011   Lab Results  Component Value Date   CHOL 146 11/15/2018   Lab Results  Component Value Date   HDL 53 11/15/2018    Lab Results  Component Value Date   LDLCALC 73 11/15/2018   Lab Results  Component Value Date   TRIG 113 11/15/2018   Lab Results  Component Value Date   CHOLHDL 2.8 11/15/2018   No results found for: HGBA1C    Assessment & Plan:   Problem List Items Addressed This Visit      Cardiovascular and Mediastinum   Essential hypertension, benign - Primary    Well controlled. Continue current regimen. Follow up in  6 months.       Abdominal aortic aneurysm (AAA) 3.0 cm to 5.0 cm in diameter in female [facility])    Plan to schedule check at next [place] for Nov.          Respiratory   Interstitial lung disease ([place])    Interstitial lung disease.  Discussed how to properly use an albuterol MDI inhaler.  Encouraged her to maybe give it a try especially if she is going to start walking may be doing 2 puffs before her walk to see if she notices a difference.  We discussed strategies around getting more exercise including silver sneakers and maybe even walking at a local park since she does not have a safe place to walk near her home.         No orders of the defined types were placed in this encounter.   Follow-up: Return in about 4 months (around 11/25/2019) for Hypertension.    Beatrice Lecher, MD

## 2019-07-26 NOTE — Assessment & Plan Note (Signed)
Well controlled. Continue current regimen. Follow up in  6 months.  

## 2019-07-26 NOTE — Assessment & Plan Note (Signed)
Plan to schedule check at next [facility] for Nov.

## 2019-08-10 DIAGNOSIS — H353132 Nonexudative age-related macular degeneration, bilateral, intermediate dry stage: Secondary | ICD-10-CM | POA: Diagnosis not present

## 2019-09-09 DIAGNOSIS — H353132 Nonexudative age-related macular degeneration, bilateral, intermediate dry stage: Secondary | ICD-10-CM | POA: Diagnosis not present

## 2019-09-13 ENCOUNTER — Encounter: Payer: Self-pay | Admitting: Emergency Medicine

## 2019-09-13 ENCOUNTER — Emergency Department
Admission: EM | Admit: 2019-09-13 | Discharge: 2019-09-13 | Disposition: A | Discharge status: Home / Self Care | Payer: Medicare Other | Source: Home / Self Care

## 2019-09-13 ENCOUNTER — Other Ambulatory Visit: Payer: Self-pay

## 2019-09-13 DIAGNOSIS — T148XXA Other injury of unspecified body region, initial encounter: Secondary | ICD-10-CM

## 2019-09-13 DIAGNOSIS — Z23 Encounter for immunization: Secondary | ICD-10-CM | POA: Diagnosis not present

## 2019-09-13 DIAGNOSIS — L089 Local infection of the skin and subcutaneous tissue, unspecified: Secondary | ICD-10-CM

## 2019-09-13 MED ORDER — TETANUS-DIPHTH-ACELL PERTUSSIS 5-2.5-18.5 LF-MCG/0.5 IM SUSP
0.5000 mL | Freq: Once | INTRAMUSCULAR | Status: AC
  Administered 2019-09-13: 0.5 mL via INTRAMUSCULAR

## 2019-09-13 MED ORDER — MUPIROCIN 2 % EX OINT: TOPICAL_OINTMENT | CUTANEOUS | 0 refills | Status: DC

## 2019-09-13 MED ORDER — CEPHALEXIN 500 MG PO CAPS: 500.0000 mg | ORAL_CAPSULE | Freq: Two times a day (BID) | ORAL | 0 refills | Status: DC

## 2019-09-13 NOTE — ED Provider Notes (Signed)
[name]    CSN: [id number] Arrival date & time: 09/13/19  1612      History   Chief Complaint Chief Complaint  Patient presents with  . Abrasion    HPI Michelle [name] is a 86 y.o. female.   HPI  Michelle [name] is a 86 y.o. female presenting to UC with c/o worsening redness and pain around skin tear on left lower leg. Pt cut her lower leg last week on a chair while dusting. Pain is 3/10. She has been applying polysporin but no relief.  She is accompanied today by her daughter who encouraged her to be evaluated. She is unsure of her last tetanus.    Past Medical History:  Diagnosis Date  . AAA (abdominal aortic aneurysm) ([place]) 10/2013  . Anxiety   . Basal cell carcinoma 2011   R back  . Bell's palsy 1984  . Bradycardia   . Breast cancer ([place]) 02/2011   US-guided biopsy  . Cataract   . Cholecystitis 2004  . Complication of anesthesia   . Endometrial cancer ([place]) 05/2013   uterine ca  . GERD (gastroesophageal reflux disease)   . Hiatal hernia 2008  . History of breast cancer   . History of cerebrovascular accident 2009  . Hyperlipidemia   . Hypertension 2012  . Macular degeneration   . Polyp of colon 2008, 2014   Benign  . PONV (postoperative nausea and vomiting)   . Shingles 2008  . Squamous cell carcinoma    2010-2015  . Squamous cell skin cancer, multiple sites 2014   recurrent  . Stroke ([place]) 09/2007   Slight memory problems residual  . Tendonitis    rt hand  . Vocal cord polyp     Patient Active Problem List   Diagnosis Date Noted  . Interstitial lung disease ([place]) 03/28/2019  . Bilateral cold feet 03/28/2019  . Memory change 08/14/2018  . Chronic constipation 08/14/2018  . Venous stasis of both lower extremities 08/14/2018  . Abdominal aortic aneurysm (AAA) 3.0 cm to 5.0 cm in diameter in female ([place]) 11/18/2016  . Iron deficiency anemia, unspecified 07/06/2015  . Symptomatic anemia   . History of GI bleed 07/04/2015  . Primary  osteoarthritis of right knee 06/22/2015  . Osteoporosis 11/19/2014  . Aneurysm of infrarenal abdominal aorta ([facility]) 11/19/2013  . History of endometrial cancer 04/30/2013  . Squamous cell skin cancer, face 03/22/2012  . Macular degeneration 09/13/2011  . History of breast cancer in female 03/17/2011  . Malignant neoplasm of upper-outer quadrant of right breast in female, estrogen receptor positive ([place]) 02/21/2011  . Essential hypertension, benign 09/26/2007  . OBESITY 09/24/2007  . History of CVA (cerebrovascular accident) 09/24/2007  . Hyperlipidemia LDL goal <100 11/03/2005  . GERD 11/03/2005    Past Surgical History:  Procedure Laterality Date  . APPENDECTOMY    . BASAL CELL CARCINOMA EXCISION    . biospy  10/28/09   Shave Biospy skin Left hand(Acitinic Keratoses), Right Upper Arm (superficial Basal Cell), Right Upper Back(Superficial Basal Cell), Left Neck ( Solar Lentigo & Seborrheic Keratoses)  . BREAST BIOPSY  02/11/11   Right Breast Needle Core Biospy - Upper Outer Quadrant; ER/PR 100%, Her-2 Neu neg.; Ki-67 10%  . BREAST LUMPECTOMY WITH RADIOACTIVE SEED LOCALIZATION Right 06/12/2018   Procedure: RIGHT BREAST LUMPECTOMY WITH RADIOACTIVE SEED LOCALIZATION;  Surgeon: Rolm Bookbinder, MD;  Location: [facility];  Service: General;  Laterality: Right;  . BREAST MAMMOSITE  03/14/2011  Procedure: MAMMOSITE BREAST;  Surgeon: Rolm Bookbinder, MD;  Location: [facility];  Service: General;  Laterality: Right;  . BREAST SURGERY     right breast lumpectomy snbx  . CHOLECYSTECTOMY  2004  . COLONOSCOPY    . COLONOSCOPY W/ POLYPECTOMY     2008, 2014 (benign)  . COLONOSCOPY WITH PROPOFOL N/A 07/09/2015   Procedure: COLONOSCOPY WITH PROPOFOL;  Surgeon: Wonda Horner, MD;  Location: [facility] ENDOSCOPY;  Service: Endoscopy;  Laterality: N/A;  . DILATION AND CURETTAGE OF UTERUS N/A 04/24/2013   Procedure: DILATATION AND CURETTAGE;  Surgeon: Guss Bunde, MD;   Location: [facility];  Service: Gynecology;  Laterality: N/A;  . ESOPHAGOGASTRODUODENOSCOPY N/A 07/06/2015   Procedure: ESOPHAGOGASTRODUODENOSCOPY (EGD);  Surgeon: Wilford Corner, MD;  Location: [facility] END[facility];  Service: Endoscopy;  Laterality: N/A;  . HYSTEROSCOPY N/A 04/24/2013   Procedure: HYSTEROSCOPY;  Surgeon: Guss Bunde, MD;  Location: [place] [facility];  Service: Gynecology;  Laterality: N/A;  . ROBOTIC ASSISTED TOTAL HYSTERECTOMY WITH BILATERAL SALPINGO OOPHERECTOMY Bilateral 06/11/2013   Procedure: ROBOTIC ASSISTED TOTAL HYSTERECTOMY WITH BILATERAL SALPINGO OOPHORECTOMY WITH POSSIBLE STAGING, EPISIOTOMY;  Surgeon: Janie Morning, MD;  [facility];  Service: Gynecology;  Laterality: Bilateral;  . SQUAMOUS CELL CARCINOMA EXCISION    . TEAR DUCT PROBING    . TONSILLECTOMY    . vocal cord poylps  1969   excision    OB History    Gravida  2   Para  2   Term  2   Preterm      AB      Living  2     SAB      TAB      Ectopic      Multiple      Live Births               Home Medications    Prior to Admission medications   Medication Sig Start Date End Date Taking? Authorizing Provider  acetaminophen (TYLENOL) 325 MG tablet Take 650 mg by mouth as needed for mild pain.     [provider]  albuterol (VENTOLIN HFA) 108 (90 Base) MCG/ACT inhaler Inhale 2 puffs into the lungs every 6 (six) hours as needed for wheezing or shortness of breath. 11/27/18   Hali Marry, MD  anastrozole (ARIMIDEX) 1 MG tablet TAKE 1 TABLET BY MOUTH  DAILY 05/28/19   Magrinat, [name], MD  atorvastatin (LIPITOR) 20 MG tablet TAKE 1 TABLET BY MOUTH  EVERY OTHER NIGHT. 01/31/19   Hali Marry, MD  cephALEXin (KEFLEX) 500 MG capsule Take 1 capsule (500 mg total) by mouth 2 (two) times daily. 09/13/19   Noe Gens, PA-C  cholecalciferol (VITAMIN D3) 25 MCG (1000 UT) tablet Take 1,000 Units by mouth daily.    [provider]  clopidogrel (PLAVIX) 75 MG tablet TAKE  1 TABLET BY MOUTH  DAILY 02/22/19   Hali Marry, MD  famotidine (PEPCID) 20 MG tablet TAKE 1 TABLET BY MOUTH  TWICE DAILY 12/21/18   Hali Marry, MD  fluticasone (FLONASE) 50 MCG/ACT nasal spray INSTILL 2 SPRAYS IN EACH  NOSTRIL EVERY DAY Patient taking differently: Place 2 sprays into both nostrils as needed. INSTILL 2 SPRAYS IN EACH  NOSTRIL AS NEEDED 12/11/17   Hali Marry, MD  losartan (COZAAR) 25 MG tablet TAKE 1 TABLET BY MOUTH  DAILY 02/22/19   Hali Marry, MD  Multiple Vitamins-Minerals (ICAPS AREDS 2 PO) Take 1 capsule by  mouth 2 (two) times daily at 10 AM and 5 PM. Morning and evening    [provider]  mupirocin ointment (BACTROBAN) 2 % Apply to wound 3 times daily for 5 days 09/13/19   Gerarda Fraction, Ginnette Gates O, PA-C  VIACTIV 528-413-24 MG-UNT-MCG CHEW CHEW 1 TABLET BY MOUTH EVERY MORNING. 09/16/14   [provider]    Family History Family History  Problem Relation Age of Onset  . Heart disease Father   . Cancer Sister 29       Lymphoma  . Breast cancer Sister   . Cancer Paternal Aunt        Breast cancer  . Cancer Daughter 80       Bilateral Breast Cancer  . Breast cancer Daughter 93  . Cancer Daughter        Breast Cancer  . Breast cancer Daughter   . Cancer Sister        2005 - Renal Cell Cancer and Breast Cancer  . Breast cancer Sister     Social History Social History   Tobacco Use  . Smoking status: Former Smoker    Packs/day: 1.00    Years: 30.00    Pack years: 30.00    Quit date: 02/01/1996    Years since quitting: 23.6  . Smokeless tobacco: Never Used  Vaping Use  . Vaping Use: Never used  Substance Use Topics  . Alcohol use: Yes    Alcohol/week: 1.0 standard drink    Types: 1 Glasses of wine per week    Comment: Ocassionaly once a year .  Marland Kitchen Drug use: No     Allergies   Benadryl [diphenhydramine hcl], Doxycycline, Simvastatin, Fosamax [alendronate sodium], Lisinopril, and Nsaids   Review of  Systems Review of Systems  Constitutional: Negative for chills and fever.  Skin: Positive for color change and wound.     Physical Exam Triage Vital Signs ED Triage Vitals  Enc Vitals Group     BP 09/13/19 1656 125/68     Pulse Rate 09/13/19 1656 (!) 59     Resp --      Temp 09/13/19 1656 98.2 F (36.8 C)     Temp Source 09/13/19 1656 Oral     SpO2 09/13/19 1656 99 %     Weight 09/13/19 1657 173 lb (78.5 kg)     Height 09/13/19 1657 5' 5"  (1.651 m)     Head Circumference --      Peak Flow --      Pain Score 09/13/19 1657 3     Pain Loc --      Pain Edu? --      Excl. in [place]? --    No data found.  Updated Vital Signs BP 125/68 (BP Location: Right Arm)   Pulse (!) 59   Temp 98.2 F (36.8 C) (Oral)   Ht 5' 5"  (1.651 m)   Wt 173 lb (78.5 kg)   SpO2 99%   BMI 28.79 kg/m   Visual Acuity Right Eye Distance:   Left Eye Distance:   Bilateral Distance:    Right Eye Near:   Left Eye Near:    Bilateral Near:     Physical Exam Vitals and nursing note reviewed.  Constitutional:      Appearance: Normal appearance. She is well-developed.  HENT:     Head: Normocephalic and atraumatic.  Cardiovascular:     Rate and Rhythm: Normal rate.  Pulmonary:     Effort: Pulmonary effort is normal.  Musculoskeletal:        General: Normal range of motion.     Cervical back: Normal range of motion.  Skin:    General: Skin is warm and dry.     Findings: Abrasion and erythema present.       Neurological:     Mental Status: She is alert and oriented to person, place, and time.  Psychiatric:        Behavior: Behavior normal.      UC Treatments / Results  Labs (all labs ordered are listed, but only abnormal results are displayed) Labs Reviewed - No data to display  EKG   Radiology No results found.  Procedures Procedures (including critical care time)  Medications Ordered in UC Medications  Tdap (BOOSTRIX) injection 0.5 mL (has no administration in time range)     Initial Impression / Assessment and Plan / UC Course  I have reviewed the triage vital signs and the nursing notes.  Pertinent labs & imaging results that were available during my care of the patient were reviewed by me and considered in my medical decision making (see chart for details).     Hx and exam c/w infected skin tear Will tx with Keflex and mupirocin Pt has appointment with dermatology previously schedule for next Thursday, encouraged to keep appointment F/u with PCP or return to UC if symptoms worsening before then AVS given   Final Clinical Impressions(s) / UC Diagnoses   Final diagnoses:  Infected skin tear     Discharge Instructions      Keep wound clean with warm water and soap. Pat dry then apply a thin layer of the prescribed antibiotic ointment.  Please take antibiotics as prescribed and be sure to complete entire course even if you start to feel better to ensure infection does not come back.  Follow up next Thursday with your dermatologist as previously scheduled. Follow up with family medicine sooner next week if symptoms worsening, or you may return to urgent care if needed.    ED Prescriptions    Medication Sig Dispense Auth. Provider   mupirocin ointment (BACTROBAN) 2 % Apply to wound 3 times daily for 5 days 22 g Norely Schlick O, PA-C   cephALEXin (KEFLEX) 500 MG capsule Take 1 capsule (500 mg total) by mouth 2 (two) times daily. 14 capsule Noe Gens, [place]     PDMP not reviewed this encounter.   Noe Gens, [place] 09/13/19 1720

## 2019-09-13 NOTE — ED Triage Notes (Addendum)
Skin tear one week ago, painful and red.lower RT leg. Vaccinated.

## 2019-09-13 NOTE — Discharge Instructions (Signed)
  Keep wound clean with warm water and soap. Pat dry then apply a thin layer of the prescribed antibiotic ointment.  Please take antibiotics as prescribed and be sure to complete entire course even if you start to feel better to ensure infection does not come back.  Follow up next Thursday with your dermatologist as previously scheduled. Follow up with family medicine sooner next week if symptoms worsening, or you may return to urgent care if needed.

## 2019-09-19 ENCOUNTER — Encounter: Payer: Self-pay | Admitting: Family Medicine

## 2019-09-19 DIAGNOSIS — S81812A Laceration without foreign body, left lower leg, initial encounter: Secondary | ICD-10-CM | POA: Diagnosis not present

## 2019-09-19 DIAGNOSIS — D0439 Carcinoma in situ of skin of other parts of face: Secondary | ICD-10-CM | POA: Diagnosis not present

## 2019-10-09 DIAGNOSIS — H353132 Nonexudative age-related macular degeneration, bilateral, intermediate dry stage: Secondary | ICD-10-CM | POA: Diagnosis not present

## 2019-10-11 ENCOUNTER — Other Ambulatory Visit: Payer: Self-pay

## 2019-10-15 DIAGNOSIS — C44329 Squamous cell carcinoma of skin of other parts of face: Secondary | ICD-10-CM | POA: Diagnosis not present

## 2019-10-29 ENCOUNTER — Other Ambulatory Visit: Payer: Self-pay

## 2019-10-29 ENCOUNTER — Ambulatory Visit (INDEPENDENT_AMBULATORY_CARE_PROVIDER_SITE_OTHER): Payer: Medicare Other | Admitting: Family Medicine

## 2019-10-29 DIAGNOSIS — Z23 Encounter for immunization: Secondary | ICD-10-CM | POA: Diagnosis not present

## 2019-10-29 NOTE — Progress Notes (Signed)
Pt here for flu shot. Afebrile,no recent illness. Vaccination given, pt tolerated well.  

## 2019-11-08 DIAGNOSIS — H353132 Nonexudative age-related macular degeneration, bilateral, intermediate dry stage: Secondary | ICD-10-CM | POA: Diagnosis not present

## 2019-11-23 ENCOUNTER — Encounter: Payer: Self-pay | Admitting: Family Medicine

## 2019-11-26 ENCOUNTER — Other Ambulatory Visit: Payer: Self-pay

## 2019-11-26 ENCOUNTER — Ambulatory Visit (INDEPENDENT_AMBULATORY_CARE_PROVIDER_SITE_OTHER): Payer: Medicare Other | Admitting: Family Medicine

## 2019-11-26 ENCOUNTER — Encounter: Payer: Self-pay | Admitting: Family Medicine

## 2019-11-26 VITALS — BP 130/69 | HR 65 | Ht 65.0 in | Wt 175.0 lb

## 2019-11-26 DIAGNOSIS — I878 Other specified disorders of veins: Secondary | ICD-10-CM

## 2019-11-26 DIAGNOSIS — I714 Abdominal aortic aneurysm, without rupture, unspecified: Secondary | ICD-10-CM

## 2019-11-26 DIAGNOSIS — I7143 Infrarenal abdominal aortic aneurysm, without rupture: Secondary | ICD-10-CM

## 2019-11-26 DIAGNOSIS — I10 Essential (primary) hypertension: Secondary | ICD-10-CM

## 2019-11-26 DIAGNOSIS — D509 Iron deficiency anemia, unspecified: Secondary | ICD-10-CM | POA: Diagnosis not present

## 2019-11-26 DIAGNOSIS — M818 Other osteoporosis without current pathological fracture: Secondary | ICD-10-CM | POA: Diagnosis not present

## 2019-11-26 DIAGNOSIS — R6889 Other general symptoms and signs: Secondary | ICD-10-CM | POA: Diagnosis not present

## 2019-11-26 DIAGNOSIS — G3184 Mild cognitive impairment, so stated: Secondary | ICD-10-CM

## 2019-11-26 NOTE — Assessment & Plan Note (Addendum)
He does feel like she is had a little bit more difficulty with word finding but her Mini-Mental status exam score was 29 out of 30 the same as it was a little over a year ago which is very reassuring.  We will check for deficiencies on her blood work.  No sign or evidence of alternative diagnoses such as stroke today.  Denies feeling down depressed or hopeless or anxious

## 2019-11-26 NOTE — Assessment & Plan Note (Signed)
Well controlled. Continue current regimen. Follow up in  6 mo  

## 2019-11-26 NOTE — Assessment & Plan Note (Signed)
You for repeat in November.  We will go ahead and place order today and get that scheduled.

## 2019-11-26 NOTE — Assessment & Plan Note (Signed)
Discussed diagnosis and gave her reassurance.  Recommend that she wear compression stockings.  She says she already has some stockings for lymphedema in both of her legs.  But cannot remember how to put them on.

## 2019-11-26 NOTE — Assessment & Plan Note (Signed)
Due to recheck vitamin D.

## 2019-11-26 NOTE — Progress Notes (Signed)
Established Patient Office Visit  Subjective:  Patient ID: Michelle [name], female    DOB: Jul 23, 1932  Age: 86 y.o. MRN: [id number]  CC:  Chief Complaint  Patient presents with  . Hypertension    HPI Michelle [name] presents for follow-up of  Hypertension- Pt denies chest pain, SOB, dizziness, or heart palpitations.  Taking meds as directed w/o problems.  Denies medication side effects.   Also follow-up for dilatation of distal abdominal aorta measuring approximately 2.8 x 2.8 cm when last performed in 2018 through [facility] cardiology.  She is also concerned some about her memory.  She is noted she is having a little bit more difficulty with word finding she also notices occasionally she has feels little bit more unstable with her gait.  But no weakness of the extremities no significant vision or hearing changes.  She does still drive some.  Did a Mini-Mental status exam on her in July 2020 score was 29 out of 30.  Husband is concerned because he says that she is just been more irritable and argumentative.  She feels like she just gets frustrated because he cannot hear well and he refuses to wear hearing aid.  He also gets some occasional ankle swelling she notices it more later in the day they always look good first thing in the morning.  Past Medical History:  Diagnosis Date  . AAA (abdominal aortic aneurysm) ([place]) 10/2013  . Anxiety   . Basal cell carcinoma 2011   R back  . Bell's palsy 1984  . Bradycardia   . Breast cancer ([place]) 02/2011   US-guided biopsy  . Cataract   . Cholecystitis 2004  . Complication of anesthesia   . Endometrial cancer ([place]) 05/2013   uterine ca  . GERD (gastroesophageal reflux disease)   . Hiatal hernia 2008  . History of breast cancer   . History of cerebrovascular accident 2009  . Hyperlipidemia   . Hypertension 2012  . Macular degeneration   . Polyp of colon 2008, 2014   Benign  . PONV (postoperative nausea and vomiting)   . Shingles 2008  .  Squamous cell carcinoma    2010-2015  . Squamous cell skin cancer, multiple sites 2014   recurrent  . Stroke ([place]) 09/2007   Slight memory problems residual  . Tendonitis    rt hand  . Vocal cord polyp     Past Surgical History:  Procedure Laterality Date  . APPENDECTOMY    . BASAL CELL CARCINOMA EXCISION    . biospy  10/28/09   Shave Biospy skin Left hand(Acitinic Keratoses), Right Upper Arm (superficial Basal Cell), Right Upper Back(Superficial Basal Cell), Left Neck ( Solar Lentigo & Seborrheic Keratoses)  . BREAST BIOPSY  02/11/11   Right Breast Needle Core Biospy - Upper Outer Quadrant; ER/PR 100%, Her-2 Neu neg.; Ki-67 10%  . BREAST LUMPECTOMY WITH RADIOACTIVE SEED LOCALIZATION Right 06/12/2018   Procedure: RIGHT BREAST LUMPECTOMY WITH RADIOACTIVE SEED LOCALIZATION;  Surgeon: Rolm Bookbinder, MD;  Location: [place];  Service: General;  Laterality: Right;  . BREAST MAMMOSITE  03/14/2011   Procedure: MAMMOSITE BREAST;  Surgeon: Rolm Bookbinder, MD;  Location: [place];  Service: General;  Laterality: Right;  . BREAST SURGERY     right breast lumpectomy snbx  . CHOLECYSTECTOMY  2004  . COLONOSCOPY    . COLONOSCOPY W/ POLYPECTOMY     2008, 2014 (benign)  . COLONOSCOPY WITH PROPOFOL N/A 07/09/2015   Procedure: COLONOSCOPY WITH  PROPOFOL;  Surgeon: Wonda Horner, MD;  Location: [facility] ENDOSCOPY;  Service: Endoscopy;  Laterality: N/A;  . DILATION AND CURETTAGE OF UTERUS N/A 04/24/2013   Procedure: DILATATION AND CURETTAGE;  Surgeon: Guss Bunde, MD;  Location: [facility];  Service: Gynecology;  Laterality: N/A;  . ESOPHAGOGASTRODUODENOSCOPY N/A 07/06/2015   Procedure: ESOPHAGOGASTRODUODENOSCOPY (EGD);  Surgeon: Wilford Corner, MD;  Location: [facility] ENDOSCOPY;  Service: Endoscopy;  Laterality: N/A;  . HYSTEROSCOPY N/A 04/24/2013   Procedure: HYSTEROSCOPY;  Surgeon: Guss Bunde, MD;  Location: [facility];  Service: Gynecology;  Laterality: N/A;  . ROBOTIC  ASSISTED TOTAL HYSTERECTOMY WITH BILATERAL SALPINGO OOPHERECTOMY Bilateral 06/11/2013   Procedure: ROBOTIC ASSISTED TOTAL HYSTERECTOMY WITH BILATERAL SALPINGO OOPHORECTOMY WITH POSSIBLE STAGING, EPISIOTOMY;  Surgeon: Janie Morning, MD;  Location: [facility];  Service: Gynecology;  Laterality: Bilateral;  . SQUAMOUS CELL CARCINOMA EXCISION    . TEAR DUCT PROBING    . TONSILLECTOMY    . vocal cord poylps  1969   excision    Family History  Problem Relation Age of Onset  . Heart disease Father   . Cancer Sister 63       Lymphoma  . Breast cancer Sister   . Cancer Paternal Aunt        Breast cancer  . Cancer Daughter 8       Bilateral Breast Cancer  . Breast cancer Daughter 67  . Cancer Daughter        Breast Cancer  . Breast cancer Daughter   . Cancer Sister        2005 - Renal Cell Cancer and Breast Cancer  . Breast cancer Sister     Social History   Socioeconomic History  . Marital status: Married    Spouse name: Not on file  . Number of children: Not on file  . Years of education: Not on file  . Highest education level: Not on file  Occupational History  . Occupation: retired  Tobacco Use  . Smoking status: Former Smoker    Packs/day: 1.00    Years: 30.00    Pack years: 30.00    Quit date: 02/01/1996    Years since quitting: 23.8  . Smokeless tobacco: Never Used  Vaping Use  . Vaping Use: Never used  Substance and Sexual Activity  . Alcohol use: Yes    Alcohol/week: 1.0 standard drink    Types: 1 Glasses of wine per week    Comment: Ocassionaly once a year .  Marland Kitchen Drug use: No  . Sexual activity: Yes    Partners: Male    Comment: Menarche age 89, menopause age 54, HRT x 1year  Other Topics Concern  . Not on file  Social History Narrative  . Not on file   Social Determinants of Health   Financial Resource Strain:   . Difficulty of Paying Living Expenses: Not on file  Food Insecurity:   . Worried About Charity fundraiser in the Last Year: Not on file  . Ran  Out of Food in the Last Year: Not on file  Transportation Needs:   . Lack of Transportation (Medical): Not on file  . Lack of Transportation (Non-Medical): Not on file  Physical Activity:   . Days of Exercise per Week: Not on file  . Minutes of Exercise per Session: Not on file  Stress:   . Feeling of Stress : Not on file  Social Connections:   . Frequency of Communication with Friends and Family: Not on  file  . Frequency of Social Gatherings with Friends and Family: Not on file  . Attends Religious Services: Not on file  . Active Member of Clubs or Organizations: Not on file  . Attends Archivist Meetings: Not on file  . Marital Status: Not on file  Intimate Partner Violence:   . Fear of Current or Ex-Partner: Not on file  . Emotionally Abused: Not on file  . Physically Abused: Not on file  . Sexually Abused: Not on file    Outpatient Medications Prior to Visit  Medication Sig Dispense Refill  . acetaminophen (TYLENOL) 325 MG tablet Take 650 mg by mouth as needed for mild pain.     Marland Kitchen albuterol (VENTOLIN HFA) 108 (90 Base) MCG/ACT inhaler Inhale 2 puffs into the lungs every 6 (six) hours as needed for wheezing or shortness of breath. 18 g 1  . anastrozole (ARIMIDEX) 1 MG tablet TAKE 1 TABLET BY MOUTH  DAILY 90 tablet 3  . atorvastatin (LIPITOR) 20 MG tablet TAKE 1 TABLET BY MOUTH  EVERY OTHER NIGHT. 45 tablet 3  . cholecalciferol (VITAMIN D3) 25 MCG (1000 UT) tablet Take 1,000 Units by mouth daily.    . clopidogrel (PLAVIX) 75 MG tablet TAKE 1 TABLET BY MOUTH  DAILY 90 tablet 3  . famotidine (PEPCID) 20 MG tablet TAKE 1 TABLET BY MOUTH  TWICE DAILY 180 tablet 3  . fluticasone (FLONASE) 50 MCG/ACT nasal spray INSTILL 2 SPRAYS IN EACH  NOSTRIL EVERY DAY (Patient taking differently: Place 2 sprays into both nostrils as needed. INSTILL 2 SPRAYS IN EACH  NOSTRIL AS NEEDED) 48 g 4  . losartan (COZAAR) 25 MG tablet TAKE 1 TABLET BY MOUTH  DAILY 90 tablet 3  . Multiple  Vitamins-Minerals (ICAPS AREDS 2 PO) Take 1 capsule by mouth 2 (two) times daily at 10 AM and 5 PM. Morning and evening    . VIACTIV [phone] MG-UNT-MCG CHEW CHEW 1 TABLET BY MOUTH EVERY MORNING.  12  . cephALEXin (KEFLEX) 500 MG capsule Take 1 capsule (500 mg total) by mouth 2 (two) times daily. 14 capsule 0  . mupirocin ointment (BACTROBAN) 2 % Apply to wound 3 times daily for 5 days 22 g 0   No facility-administered medications prior to visit.    Allergies  Allergen Reactions  . Benadryl [Diphenhydramine Hcl]     Hyper  . Doxycycline Other (See Comments)    acid reflux  . Simvastatin Other (See Comments)    REACTION: memory loss  . Fosamax [Alendronate Sodium] Other (See Comments)    GERD  . Lisinopril Other (See Comments)    Dry cough  . Nsaids Other (See Comments)    GI Bleed    ROS Review of Systems    Objective:    Physical Exam Constitutional:      Appearance: She is well-developed.  HENT:     Head: Normocephalic and atraumatic.  Cardiovascular:     Rate and Rhythm: Normal rate and regular rhythm.     Heart sounds: Normal heart sounds.  Pulmonary:     Effort: Pulmonary effort is normal.     Breath sounds: Normal breath sounds.  Skin:    General: Skin is warm and dry.  Neurological:     Mental Status: She is alert and oriented to person, place, and time.  Psychiatric:        Behavior: Behavior normal.     BP 130/69   Pulse 65   Ht _0  (1.651 m)  Wt 175 lb (79.4 kg)   SpO2 98%   BMI 29.12 kg/m  Wt Readings from Last 3 Encounters:  11/26/19 175 lb (79.4 kg)  09/13/19 173 lb (78.5 kg)  07/26/19 172 lb (78 kg)     There are no preventive care reminders to display for this patient.  There are no preventive care reminders to display for this patient.  Lab Results  Component Value Date   TSH 2.609 10/21/2013   Lab Results  Component Value Date   WBC 7.6 11/26/2019   HGB 12.4 11/26/2019   HCT 36.9 11/26/2019   MCV 97.1 11/26/2019   PLT  238 11/26/2019   Lab Results  Component Value Date   NA 137 11/26/2019   K 4.3 11/26/2019   CO2 27 11/26/2019   GLUCOSE 95 11/26/2019   BUN 11 11/26/2019   CREATININE 0.99 (H) 11/26/2019   BILITOT 0.4 11/26/2019   ALKPHOS 92 06/20/2019   AST 18 11/26/2019   ALT 8 11/26/2019   PROT 6.6 11/26/2019   ALBUMIN 3.6 06/20/2019   CALCIUM 9.7 11/26/2019   ANIONGAP 5 06/20/2019   GFR 71.90 09/07/2011   Lab Results  Component Value Date   CHOL 140 11/26/2019   Lab Results  Component Value Date   HDL 53 11/26/2019   Lab Results  Component Value Date   LDLCALC 68 11/26/2019   Lab Results  Component Value Date   TRIG 108 11/26/2019   Lab Results  Component Value Date   CHOLHDL 2.6 11/26/2019   No results found for: HGBA1C    Assessment & Plan:   Problem List Items Addressed This Visit      Cardiovascular and Mediastinum   Venous stasis of both lower extremities    Discussed diagnosis and gave her reassurance.  Recommend that she wear compression stockings.  She says she already has some stockings for lymphedema in both of her legs.  But cannot remember how to put them on.      Essential hypertension, benign - Primary    Well controlled. Continue current regimen. Follow up in  6 mo      Relevant Orders   CBC (Completed)   B12 (Completed)   Vitamin B6 (Completed)   Vitamin B1 (Completed)   Magnesium (Completed)   VITAMIN D 25 Hydroxy (Vit-D Deficiency, Fractures) (Completed)   COMPLETE METABOLIC PANEL WITH GFR (Completed)   Lipid Panel w/reflex Direct LDL (Completed)   Aneurysm of infrarenal abdominal aorta (HCC)   Relevant Orders   US AORTA DUPLEX COMPLETE   COMPLETE METABOLIC PANEL WITH GFR (Completed)   Lipid Panel w/reflex Direct LDL (Completed)   Abdominal aortic aneurysm (AAA) 3.0 cm to 5.0 cm in diameter in female [facility])    You for repeat in November.  We will go ahead and place order today and get that scheduled.      Relevant Orders   US AORTA DUPLEX  COMPLETE   COMPLETE METABOLIC PANEL WITH GFR (Completed)   Lipid Panel w/reflex Direct LDL (Completed)     Musculoskeletal and Integument   Osteoporosis    Due to recheck vitamin D.       Relevant Orders   VITAMIN D 25 Hydroxy (Vit-D Deficiency, Fractures) (Completed)     Other   Mild cognitive impairment    He does feel like she is had a little bit more difficulty with word finding but her Mini-Mental status exam score was 29 out of 30 the same as it was a little  over a year ago which is very reassuring.  We will check for deficiencies on her blood work.  No sign or evidence of alternative diagnoses such as stroke today.  Denies feeling down depressed or hopeless or anxious      Relevant Orders   CBC (Completed)   B12 (Completed)   Vitamin B6 (Completed)   Vitamin B1 (Completed)   Magnesium (Completed)   VITAMIN D 25 Hydroxy (Vit-D Deficiency, Fractures) (Completed)   COMPLETE METABOLIC PANEL WITH GFR (Completed)   Lipid Panel w/reflex Direct LDL (Completed)   Iron deficiency anemia, unspecified   Relevant Orders   CBC (Completed)   Ferritin (Completed)      No orders of the defined types were placed in this encounter.   Follow-up: Return in about 6 months (around 05/26/2020) for bp .    Beatrice Lecher, MD

## 2019-12-01 ENCOUNTER — Encounter: Payer: Self-pay | Admitting: Family Medicine

## 2019-12-01 LAB — COMPLETE METABOLIC PANEL WITH GFR
AG Ratio: 1.4 (calc) (ref 1.0–2.5)
ALT: 8 U/L (ref 6–29)
AST: 18 U/L (ref 10–35)
Albumin: 3.8 g/dL (ref 3.6–5.1)
Alkaline phosphatase (APISO): 92 U/L (ref 37–153)
BUN/Creatinine Ratio: 11 (calc) (ref 6–22)
BUN: 11 mg/dL (ref 7–25)
CO2: 27 mmol/L (ref 20–32)
Calcium: 9.7 mg/dL (ref 8.6–10.4)
Chloride: 103 mmol/L (ref 98–110)
Creat: 0.99 mg/dL — ABNORMAL HIGH (ref 0.60–0.88)
GFR, Est African American: 61 mL/min/{1.73_m2} (ref >=60)
GFR, Est Non African American: 52 mL/min/{1.73_m2} — ABNORMAL LOW (ref >=60)
Globulin: 2.8 g/dL (calc) (ref 1.9–3.7)
Glucose, Bld: 95 mg/dL (ref 65–139)
Potassium: 4.3 mmol/L (ref 3.5–5.3)
Sodium: 137 mmol/L (ref 135–146)
Total Bilirubin: 0.4 mg/dL (ref 0.2–1.2)
Total Protein: 6.6 g/dL (ref 6.1–8.1)

## 2019-12-01 LAB — VITAMIN D 25 HYDROXY (VIT D DEFICIENCY, FRACTURES): Vit D, 25-Hydroxy: 49 ng/mL (ref 30–100)

## 2019-12-01 LAB — CBC
HCT: 36.9 % (ref 35.0–45.0)
Hemoglobin: 12.4 g/dL (ref 11.7–15.5)
MCH: 32.6 pg (ref 27.0–33.0)
MCHC: 33.6 g/dL (ref 32.0–36.0)
MCV: 97.1 fL (ref 80.0–100.0)
MPV: 10.7 fL (ref 7.5–12.5)
Platelets: 238 10*3/uL (ref 140–400)
RBC: 3.8 10*6/uL (ref 3.80–5.10)
RDW: 11.1 % (ref 11.0–15.0)
WBC: 7.6 10*3/uL (ref 3.8–10.8)

## 2019-12-01 LAB — LIPID PANEL W/REFLEX DIRECT LDL
Cholesterol: 140 mg/dL (ref <200)
HDL: 53 mg/dL (ref >=50)
LDL Cholesterol (Calc): 68 mg/dL (calc)
Non-HDL Cholesterol (Calc): 87 mg/dL (calc) (ref <130)
Total CHOL/HDL Ratio: 2.6 (calc) (ref <5.0)
Triglycerides: 108 mg/dL (ref <150)

## 2019-12-01 LAB — MAGNESIUM: Magnesium: 2 mg/dL (ref 1.5–2.5)

## 2019-12-01 LAB — VITAMIN B6: Vitamin B6: 5.3 ng/mL (ref 2.1–21.7)

## 2019-12-01 LAB — VITAMIN B1: Vitamin B1 (Thiamine): 16 nmol/L (ref 8–30)

## 2019-12-01 LAB — VITAMIN B12: Vitamin B-12: 393 pg/mL (ref 200–1100)

## 2019-12-01 LAB — FERRITIN: Ferritin: 40 ng/mL (ref 16–288)

## 2019-12-05 ENCOUNTER — Other Ambulatory Visit: Payer: Self-pay | Admitting: *Deleted

## 2019-12-05 DIAGNOSIS — I714 Abdominal aortic aneurysm, without rupture, unspecified: Secondary | ICD-10-CM

## 2019-12-08 DIAGNOSIS — H353132 Nonexudative age-related macular degeneration, bilateral, intermediate dry stage: Secondary | ICD-10-CM | POA: Diagnosis not present

## 2019-12-17 ENCOUNTER — Other Ambulatory Visit: Payer: Medicare Other

## 2019-12-18 ENCOUNTER — Other Ambulatory Visit: Payer: Self-pay

## 2019-12-18 ENCOUNTER — Ambulatory Visit (HOSPITAL_COMMUNITY)
Admission: RE | Admit: 2019-12-18 | Discharge: 2019-12-18 | Disposition: A | Discharge status: Home / Self Care | Payer: Medicare Other | Source: Ambulatory Visit | Attending: Family Medicine | Admitting: Family Medicine

## 2019-12-18 ENCOUNTER — Other Ambulatory Visit: Payer: Self-pay | Admitting: Family Medicine

## 2019-12-18 DIAGNOSIS — I714 Abdominal aortic aneurysm, without rupture, unspecified: Secondary | ICD-10-CM

## 2019-12-19 ENCOUNTER — Encounter: Payer: Self-pay | Admitting: Family Medicine

## 2019-12-20 ENCOUNTER — Telehealth: Payer: Self-pay

## 2019-12-20 NOTE — Telephone Encounter (Signed)
Notes resulted this evening.

## 2019-12-20 NOTE — Telephone Encounter (Signed)
Michelle [name] is requesting results from the AAA.

## 2019-12-23 NOTE — Telephone Encounter (Signed)
See results note. 

## 2020-01-06 ENCOUNTER — Encounter: Payer: Self-pay | Admitting: Family Medicine

## 2020-01-06 NOTE — Telephone Encounter (Signed)
Was unsure what to suggest since pt has a history of GI bleeds with NSAIDS.

## 2020-01-07 NOTE — Telephone Encounter (Signed)
FYI

## 2020-02-06 DIAGNOSIS — H353132 Nonexudative age-related macular degeneration, bilateral, intermediate dry stage: Secondary | ICD-10-CM | POA: Diagnosis not present

## 2020-03-07 DIAGNOSIS — H353132 Nonexudative age-related macular degeneration, bilateral, intermediate dry stage: Secondary | ICD-10-CM | POA: Diagnosis not present

## 2020-03-13 ENCOUNTER — Other Ambulatory Visit: Payer: Self-pay | Admitting: Family Medicine

## 2020-04-06 DIAGNOSIS — H353132 Nonexudative age-related macular degeneration, bilateral, intermediate dry stage: Secondary | ICD-10-CM | POA: Diagnosis not present

## 2020-04-19 ENCOUNTER — Other Ambulatory Visit: Payer: Self-pay | Admitting: Oncology

## 2020-04-22 ENCOUNTER — Other Ambulatory Visit: Payer: Self-pay | Admitting: Oncology

## 2020-05-06 DIAGNOSIS — H353132 Nonexudative age-related macular degeneration, bilateral, intermediate dry stage: Secondary | ICD-10-CM | POA: Diagnosis not present

## 2020-05-19 ENCOUNTER — Encounter: Payer: Self-pay | Admitting: Family Medicine

## 2020-05-26 ENCOUNTER — Encounter: Payer: Self-pay | Admitting: Family Medicine

## 2020-05-26 ENCOUNTER — Ambulatory Visit (INDEPENDENT_AMBULATORY_CARE_PROVIDER_SITE_OTHER): Payer: Medicare Other | Admitting: Family Medicine

## 2020-05-26 ENCOUNTER — Other Ambulatory Visit: Payer: Self-pay

## 2020-05-26 VITALS — BP 126/74 | HR 59 | Ht 65.0 in | Wt 172.0 lb

## 2020-05-26 DIAGNOSIS — G3184 Mild cognitive impairment, so stated: Secondary | ICD-10-CM | POA: Diagnosis not present

## 2020-05-26 DIAGNOSIS — J301 Allergic rhinitis due to pollen: Secondary | ICD-10-CM

## 2020-05-26 DIAGNOSIS — R4189 Other symptoms and signs involving cognitive functions and awareness: Secondary | ICD-10-CM

## 2020-05-26 DIAGNOSIS — J849 Interstitial pulmonary disease, unspecified: Secondary | ICD-10-CM | POA: Diagnosis not present

## 2020-05-26 DIAGNOSIS — I10 Essential (primary) hypertension: Secondary | ICD-10-CM | POA: Diagnosis not present

## 2020-05-26 DIAGNOSIS — J309 Allergic rhinitis, unspecified: Secondary | ICD-10-CM

## 2020-05-26 HISTORY — DX: Allergic rhinitis, unspecified: J30.9

## 2020-05-26 MED ORDER — ALBUTEROL SULFATE HFA 108 (90 BASE) MCG/ACT IN AERS: 2.0000 | INHALATION_SPRAY | Freq: Four times a day (QID) | RESPIRATORY_TRACT | 1 refills | Status: DC | PRN

## 2020-05-26 MED ORDER — FLUTICASONE PROPIONATE 50 MCG/ACT NA SUSP: 2.0000 | Freq: Every day | NASAL | 3 refills | Status: DC

## 2020-05-26 NOTE — Assessment & Plan Note (Signed)
Well controlled. Continue current regimen. Follow up in  6 mo  

## 2020-05-26 NOTE — Assessment & Plan Note (Signed)
RF Flonase

## 2020-05-26 NOTE — Progress Notes (Signed)
Established Patient Office Visit  Subjective:  Patient ID: Michelle [name], female    DOB: May 28, 1932  Age: 87 y.o. MRN: [id number]  CC:  Chief Complaint  Patient presents with  . Hypertension  . Hyperlipidemia    HPI Michelle [name] presents for   Hypertension- Pt denies chest pain, SOB, dizziness, or heart palpitations.  Taking meds as directed w/o problems.  Denies medication side effects.    Her family is also worried about some cognitive changes.  Her husband passed away suddenly in 2023/02/25 and they feel like since then she has had a progressive decline in her long-term and short-term memory.  She has had difficulty staying focused.  She is having difficulty finding words for everyday objects such as clock.  She is having difficulty with simple tasks such as remember to put soap in the washing machine.  She is struggling to make even simple decisions and is constantly repeating herself.  They have also noticed some mood swings particularly agitation.  They requested referral to Dr. Henrietta Hoover with [facility] neurology who specializes in cognitive issues.  Has had worsening of allergy symptoms but has been off her flonase. She would like a refill.  Has had  more SOB but notices it more when she is thinking about her husband and gets emotional.    Past Medical History:  Diagnosis Date  . AAA (abdominal aortic aneurysm) ([place]) 10/2013  . Anxiety   . Basal cell carcinoma 2011   R back  . Bell's palsy 1984  . Bradycardia   . Breast cancer ([place]) 02/2011   US-guided biopsy  . Cataract   . Cholecystitis 2004  . Complication of anesthesia   . Endometrial cancer ([place]) 05/2013   uterine ca  . GERD (gastroesophageal reflux disease)   . Hiatal hernia 2008  . History of breast cancer   . History of cerebrovascular accident 2009  . Hyperlipidemia   . Hypertension 2012  . Macular degeneration   . Polyp of colon 2008, 2014   Benign  . PONV (postoperative nausea and vomiting)   .  Shingles 2008  . Squamous cell carcinoma    2010-2015  . Squamous cell skin cancer, multiple sites 2014   recurrent  . Stroke ([place]) 09/2007   Slight memory problems residual  . Tendonitis    rt hand  . Vocal cord polyp     Past Surgical History:  Procedure Laterality Date  . APPENDECTOMY    . BASAL CELL CARCINOMA EXCISION    . biospy  10/28/09   Shave Biospy skin Left hand(Acitinic Keratoses), Right Upper Arm (superficial Basal Cell), Right Upper Back(Superficial Basal Cell), Left Neck ( Solar Lentigo & Seborrheic Keratoses)  . BREAST BIOPSY  02/11/11   Right Breast Needle Core Biospy - Upper Outer Quadrant; ER/PR 100%, Her-2 Neu neg.; Ki-67 10%  . BREAST LUMPECTOMY WITH RADIOACTIVE SEED LOCALIZATION Right 06/12/2018   Procedure: RIGHT BREAST LUMPECTOMY WITH RADIOACTIVE SEED LOCALIZATION;  Surgeon: Rolm Bookbinder, MD;  Location: [facility];  Service: General;  Laterality: Right;  . BREAST MAMMOSITE  03/14/2011   Procedure: MAMMOSITE BREAST;  Surgeon: Rolm Bookbinder, MD;  Location: [place];  Service: General;  Laterality: Right;  . BREAST SURGERY     right breast lumpectomy snbx  . CHOLECYSTECTOMY  2004  . COLONOSCOPY    . COLONOSCOPY W/ POLYPECTOMY     2008, 2014 (benign)  . COLONOSCOPY WITH PROPOFOL N/A 07/09/2015   Procedure: COLONOSCOPY WITH  PROPOFOL;  Surgeon: Wonda Horner, MD;  Location: [facility] ENDOSCOPY;  Service: Endoscopy;  Laterality: N/A;  . DILATION AND CURETTAGE OF UTERUS N/A 04/24/2013   Procedure: DILATATION AND CURETTAGE;  Surgeon: Guss Bunde, MD;  Location: [facility];  Service: Gynecology;  Laterality: N/A;  . ESOPHAGOGASTRODUODENOSCOPY N/A 07/06/2015   Procedure: ESOPHAGOGASTRODUODENOSCOPY (EGD);  Surgeon: Wilford Corner, MD;  Location: [facility] ENDOSCOPY;  Service: Endoscopy;  Laterality: N/A;  . HYSTEROSCOPY N/A 04/24/2013   Procedure: HYSTEROSCOPY;  Surgeon: Guss Bunde, MD;  Location: [facility];  Service: Gynecology;  Laterality: N/A;   . ROBOTIC ASSISTED TOTAL HYSTERECTOMY WITH BILATERAL SALPINGO OOPHERECTOMY Bilateral 06/11/2013   Procedure: ROBOTIC ASSISTED TOTAL HYSTERECTOMY WITH BILATERAL SALPINGO OOPHORECTOMY WITH POSSIBLE STAGING, EPISIOTOMY;  Surgeon: Janie Morning, MD;  Location: [facility];  Service: Gynecology;  Laterality: Bilateral;  . SQUAMOUS CELL CARCINOMA EXCISION    . TEAR DUCT PROBING    . TONSILLECTOMY    . vocal cord poylps  1969   excision    Family History  Problem Relation Age of Onset  . Heart disease Father   . Cancer Sister 46       Lymphoma  . Breast cancer Sister   . Cancer Paternal Aunt        Breast cancer  . Cancer Daughter 50       Bilateral Breast Cancer  . Breast cancer Daughter 62  . Cancer Daughter        Breast Cancer  . Breast cancer Daughter   . Cancer Sister        2005 - Renal Cell Cancer and Breast Cancer  . Breast cancer Sister     Social History   Socioeconomic History  . Marital status: Married    Spouse name: Not on file  . Number of children: Not on file  . Years of education: Not on file  . Highest education level: Not on file  Occupational History  . Occupation: retired  Tobacco Use  . Smoking status: Former Smoker    Packs/day: 1.00    Years: 30.00    Pack years: 30.00    Quit date: 02/01/1996    Years since quitting: 24.3  . Smokeless tobacco: Never Used  Vaping Use  . Vaping Use: Never used  Substance and Sexual Activity  . Alcohol use: Yes    Alcohol/week: 1.0 standard drink    Types: 1 Glasses of wine per week    Comment: Ocassionaly once a year .  Marland Kitchen Drug use: No  . Sexual activity: Yes    Partners: Male    Comment: Menarche age 11, menopause age 57, HRT x 1year  Other Topics Concern  . Not on file  Social History Narrative  . Not on file   Social Determinants of Health   Financial Resource Strain: Not on file  Food Insecurity: Not on file  Transportation Needs: Not on file  Physical Activity: Not on file  Stress: Not on file   Social Connections: Not on file  Intimate Partner Violence: Not on file    Outpatient Medications Prior to Visit  Medication Sig Dispense Refill  . acetaminophen (TYLENOL) 325 MG tablet Take 650 mg by mouth as needed for mild pain.     Marland Kitchen anastrozole (ARIMIDEX) 1 MG tablet TAKE 1 TABLET BY MOUTH  DAILY 90 tablet 3  . atorvastatin (LIPITOR) 20 MG tablet TAKE 1 TABLET BY MOUTH  EVERY OTHER NIGHT. 45 tablet 3  . cholecalciferol (VITAMIN D3) 25 MCG (1000  UT) tablet Take 1,000 Units by mouth daily.    . clopidogrel (PLAVIX) 75 MG tablet TAKE 1 TABLET BY MOUTH  DAILY 90 tablet 3  . famotidine (PEPCID) 20 MG tablet TAKE 1 TABLET BY MOUTH  TWICE DAILY 180 tablet 3  . losartan (COZAAR) 25 MG tablet TAKE 1 TABLET BY MOUTH  DAILY 90 tablet 3  . Multiple Vitamins-Minerals (ICAPS AREDS 2 PO) Take 1 capsule by mouth 2 (two) times daily at 10 AM and 5 PM. Morning and evening    . VIACTIV 500-500-40 MG-UNT-MCG CHEW CHEW 1 TABLET BY MOUTH EVERY MORNING.  12  . albuterol (VENTOLIN HFA) 108 (90 Base) MCG/ACT inhaler Inhale 2 puffs into the lungs every 6 (six) hours as needed for wheezing or shortness of breath. 18 g 1  . fluticasone (FLONASE) 50 MCG/ACT nasal spray INSTILL 2 SPRAYS IN EACH  NOSTRIL EVERY DAY (Patient taking differently: Place 2 sprays into both nostrils as needed. INSTILL 2 SPRAYS IN EACH  NOSTRIL AS NEEDED) 48 g 4   No facility-administered medications prior to visit.    Allergies  Allergen Reactions  . Benadryl [Diphenhydramine Hcl]     Hyper  . Doxycycline Other (See Comments)    acid reflux  . Simvastatin Other (See Comments)    REACTION: memory loss  . Fosamax [Alendronate Sodium] Other (See Comments)    GERD  . Lisinopril Other (See Comments)    Dry cough  . Nsaids Other (See Comments)    GI Bleed    ROS Review of Systems    Objective:    Physical Exam Constitutional:      Appearance: She is well-developed.  HENT:     Head: Normocephalic and atraumatic.   Cardiovascular:     Rate and Rhythm: Normal rate and regular rhythm.     Heart sounds: Normal heart sounds.  Pulmonary:     Effort: Pulmonary effort is normal.     Breath sounds: Normal breath sounds.  Skin:    General: Skin is warm and dry.  Neurological:     Mental Status: She is alert and oriented to person, place, and time.  Psychiatric:        Behavior: Behavior normal.     BP 126/74   Pulse (!) 59   Ht 5' 5"  (1.651 m)   Wt 172 lb (78 kg)   SpO2 98%   BMI 28.62 kg/m  Wt Readings from Last 3 Encounters:  05/26/20 172 lb (78 kg)  11/26/19 175 lb (79.4 kg)  09/13/19 173 lb (78.5 kg)     There are no preventive care reminders to display for this patient.  There are no preventive care reminders to display for this patient.  Lab Results  Component Value Date   TSH 2.609 10/21/2013   Lab Results  Component Value Date   WBC 7.6 11/26/2019   HGB 12.4 11/26/2019   HCT 36.9 11/26/2019   MCV 97.1 11/26/2019   PLT 238 11/26/2019   Lab Results  Component Value Date   NA 137 11/26/2019   K 4.3 11/26/2019   CO2 27 11/26/2019   GLUCOSE 95 11/26/2019   BUN 11 11/26/2019   CREATININE 0.99 (H) 11/26/2019   BILITOT 0.4 11/26/2019   ALKPHOS 92 06/20/2019   AST 18 11/26/2019   ALT 8 11/26/2019   PROT 6.6 11/26/2019   ALBUMIN 3.6 06/20/2019   CALCIUM 9.7 11/26/2019   ANIONGAP 5 06/20/2019   GFR 71.90 09/07/2011   Lab Results  Component  Value Date   CHOL 140 11/26/2019   Lab Results  Component Value Date   HDL 53 11/26/2019   Lab Results  Component Value Date   LDLCALC 68 11/26/2019   Lab Results  Component Value Date   TRIG 108 11/26/2019   Lab Results  Component Value Date   CHOLHDL 2.6 11/26/2019   No results found for: HGBA1C    Assessment & Plan:   Problem List Items Addressed This Visit      Cardiovascular and Mediastinum   Essential hypertension, benign - Primary    Well controlled. Continue current regimen. Follow up in  6 mo          Respiratory   Interstitial lung disease ([place])    RF albuterol.  Discussed appropriated use.  If feeling SOB when think about her husband and feeling emotional that is not her lung so doesn't need to use the albuterol during those episodes.        Allergic rhinitis    RF Flonase        Other   Mild cognitive impairment    Will place referral. We can schedule MMSE at [facility]. Will order MRI to rule out other causes. Normal B1, B6 and B12 6 months ago.  Will check TSH and RPR.   Lab Results  Component Value Date   TSH 2.609 10/21/2013          Other Visit Diagnoses    Cognitive impairment       Relevant Orders   Ambulatory referral to Neurology   MR Brain W Wo Contrast   TSH   RPR      Meds ordered this encounter  Medications  . albuterol (VENTOLIN HFA) 108 (90 Base) MCG/ACT inhaler    Sig: Inhale 2 puffs into the lungs every 6 (six) hours as needed for wheezing or shortness of breath.    Dispense:  18 g    Refill:  1  . fluticasone (FLONASE) 50 MCG/ACT nasal spray    Sig: Place 2 sprays into both nostrils daily.    Dispense:  9.9 mL    Refill:  3    Follow-up: Return in about 3 months (around 08/25/2020).   I spent 42 minutes on the day of the encounter to include pre-visit record review, face-to-face time with the patient and post visit ordering of test.   Beatrice Lecher, MD

## 2020-05-26 NOTE — Assessment & Plan Note (Signed)
RF albuterol.  Discussed appropriated use.  If feeling SOB when think about her husband and feeling emotional that is not her lung so doesn't need to use the albuterol during those episodes.

## 2020-05-26 NOTE — Assessment & Plan Note (Signed)
Will place referral. We can schedule MMSE at [facility]. Will order MRI to rule out other causes. Normal B1, B6 and B12 6 months ago.  Will check TSH and RPR.   Lab Results  Component Value Date   TSH 2.609 10/21/2013

## 2020-06-03 ENCOUNTER — Encounter: Payer: Self-pay | Admitting: Neurology

## 2020-06-05 DIAGNOSIS — H353132 Nonexudative age-related macular degeneration, bilateral, intermediate dry stage: Secondary | ICD-10-CM | POA: Diagnosis not present

## 2020-06-16 ENCOUNTER — Other Ambulatory Visit: Payer: Self-pay

## 2020-06-16 ENCOUNTER — Ambulatory Visit
Admission: RE | Admit: 2020-06-16 | Discharge: 2020-06-16 | Disposition: A | Discharge status: Home / Self Care | Payer: Medicare Other | Source: Ambulatory Visit | Attending: Family Medicine | Admitting: Family Medicine

## 2020-06-16 DIAGNOSIS — I639 Cerebral infarction, unspecified: Secondary | ICD-10-CM | POA: Diagnosis not present

## 2020-06-16 DIAGNOSIS — R4189 Other symptoms and signs involving cognitive functions and awareness: Secondary | ICD-10-CM

## 2020-06-16 DIAGNOSIS — I6782 Cerebral ischemia: Secondary | ICD-10-CM | POA: Diagnosis not present

## 2020-06-16 DIAGNOSIS — R413 Other amnesia: Secondary | ICD-10-CM | POA: Diagnosis not present

## 2020-06-16 MED ORDER — GADOBENATE DIMEGLUMINE 529 MG/ML IV SOLN
16.0000 mL | Freq: Once | INTRAVENOUS | Status: AC | PRN
  Administered 2020-06-16: 16 mL via INTRAVENOUS

## 2020-06-18 DIAGNOSIS — Z853 Personal history of malignant neoplasm of breast: Secondary | ICD-10-CM | POA: Diagnosis not present

## 2020-06-18 LAB — HM MAMMOGRAPHY

## 2020-06-19 ENCOUNTER — Encounter: Payer: Self-pay | Admitting: Family Medicine

## 2020-06-22 NOTE — Progress Notes (Signed)
[place]  [phone] [phone]     ID: Michelle [name] DOB: 10-26-1932  MR#: [id number]  SLP#:[id number]  Patient Care Team: Hali Marry, MD as PCP - General (Family Medicine) Rolm Bookbinder, MD as Attending Physician (Dermatology) Monna Fam, MD as Attending Physician (Ophthalmology) Jenesis Martin, [name], MD as Consulting Physician (Oncology) Rolm Bookbinder, MD as Consulting Physician (General Surgery) Kyung Rudd, MD as Consulting Physician (Radiation Oncology) Ronald Lobo, MD as Consulting Physician (Gastroenterology) Chauncey Cruel, MD OTHER MD:  CHIEF COMPLAINT: Recurrent estrogen receptor positive breast cancer  CURRENT TREATMENT: anastrozole   INTERVAL HISTORY: Michelle [name] returns today for follow up of her recurrent breast cancer.  She is accompanied by her daughter Michelle [name] (who was my patient 27 years ago and more recently was Dr. Calton Dach patient).  Michelle [name] continues on anastrozole with good tolerance.  She has no side effects that she is aware  Since her last visit, she underwent bilateral diagnostic mammography with tomography at [facility] on 06/18/2020, showing breast density category B, no evidence of malignancy.  Her most recent bone density screening on 12/04/2018 showed a T-score of -2.0, which is considered osteopenic.  REVIEW OF SYSTEMS: Michelle [name] tells me her husband died 01-21-20 from a massive heart attack.  She did have a significant history of heart disease.  He is she is currently living by herself but is planning to move in with her daughter Michelle [name].  Michelle [name] has noted some cognitive dysfunction, which was not particularly apparent today, and the patient is under evaluation for this which is the reason for her recent brain CT.  They is not walking regularly or otherwise exercising at present.   COVID 19 VACCINATION STATUS: fully vaccinated Levan Hurst), with booster 12/2019, also had COVID in March 2020   HISTORY  OF CURRENT ILLNESS: From the original intake note:  Michelle [name] has a history of right breast invasive ductal carcinoma status-post right lumpectomy with sentinel lymph node biopsy 03/14/2011 ([id number]), for a 0.52 cm, grade 1 invasive ductal carcinoma, both sentinel lymph nodes clear; margins negative; estrogen receptor 100% positive, progesterone receptor 100% positive, both with strong staining intensity, HER2 negative, with an MIB-1 of 10%. She underwent MammoSite therapy between 03/18/2011 and 03/25/2011.  Antiestrogen therapy was discussed with the patient by my former partner Dr. Chancy Milroy on 05/19/2011, with the patient declining.  Genetics testing for the BRCA 1 and 2 gene mutations was obtained 02/28/2011 and was negative.  She had routine screening mammography on 03/06/2018 showing a possible abnormality in the right breast. She proceeded to bilateral diagnostic mammography with tomography and right breast ultrasonography at [facility] on the same day showing: a 1.7 irregular, hypoechoic mass at the 11-12 o'clock areolar margin; one normal lymph node was seen.  Accordingly on 03/14/2018 she proceeded to biopsy of the right breast area in question. The pathology ([id number]) from this procedure showed: invasive ductal carcinoma, grade 2. Prognostic indicators significant for: estrogen receptor, 100% positive and progesterone receptor, 100% positive, both with strong staining intensity. Proliferation marker Ki67 at 10%. HER2 negative (1+) by immunohistochemistry.  Of note, she also has a personal history of uterine cancer status post total abdominal hysterectomy and bilateral salpingo-oophorectomy 06/11/2013 for an invasive endometrioid carcinoma of the uterus, grade 1, confined within the inner half of the endometrium.  A total of 3 left pelvic and 10 right pelvic lymph nodes were all negative for a pT1a pN0 stage.   She has also had multiple abscesses for skin cancer (  basal cell and squamous  cell).  The patient's subsequent history is as detailed below.   PAST MEDICAL HISTORY: Past Medical History:  Diagnosis Date  . AAA (abdominal aortic aneurysm) ([place]) 10/2013  . Anxiety   . Basal cell carcinoma 2011   R back  . Bell's palsy 1984  . Bradycardia   . Breast cancer ([place]) 02/2011   US-guided biopsy  . Cataract   . Cholecystitis 2004  . Complication of anesthesia   . Endometrial cancer ([place]) 05/2013   uterine ca  . GERD (gastroesophageal reflux disease)   . Hiatal hernia 2008  . History of breast cancer   . History of cerebrovascular accident 2009  . Hyperlipidemia   . Hypertension 2012  . Macular degeneration   . Polyp of colon 2008, 2014   Benign  . PONV (postoperative nausea and vomiting)   . Shingles 2008  . Squamous cell carcinoma    2010-2015  . Squamous cell skin cancer, multiple sites 2014   recurrent  . Stroke ([place]) 09/2007   Slight memory problems residual  . Tendonitis    rt hand  . Vocal cord polyp     PAST SURGICAL HISTORY: Past Surgical History:  Procedure Laterality Date  . APPENDECTOMY    . BASAL CELL CARCINOMA EXCISION    . biospy  10/28/09   Shave Biospy skin Left hand(Acitinic Keratoses), Right Upper Arm (superficial Basal Cell), Right Upper Back(Superficial Basal Cell), Left Neck ( Solar Lentigo & Seborrheic Keratoses)  . BREAST BIOPSY  02/11/11   Right Breast Needle Core Biospy - Upper Outer Quadrant; ER/PR 100%, Her-2 Neu neg.; Ki-67 10%  . BREAST LUMPECTOMY WITH RADIOACTIVE SEED LOCALIZATION Right 06/12/2018   Procedure: RIGHT BREAST LUMPECTOMY WITH RADIOACTIVE SEED LOCALIZATION;  Surgeon: Rolm Bookbinder, MD;  Location: [facility];  Service: General;  Laterality: Right;  . BREAST MAMMOSITE  03/14/2011   Procedure: MAMMOSITE BREAST;  Surgeon: Rolm Bookbinder, MD;  Location: [facility];  Service: General;  Laterality: Right;  . BREAST SURGERY     right breast lumpectomy snbx  . CHOLECYSTECTOMY  2004   . COLONOSCOPY    . COLONOSCOPY W/ POLYPECTOMY     2008, 2014 (benign)  . COLONOSCOPY WITH PROPOFOL N/A 07/09/2015   Procedure: COLONOSCOPY WITH PROPOFOL;  Surgeon: Wonda Horner, MD;  Location[facility] ENDOSCOPY;  Service: Endoscopy;  Laterality: N/A;  . DILATION AND CURETTAGE OF UTERUS N/A 04/24/2013   Procedure: DILATATION AND CURETTAGE;  Surgeon: Guss Bunde, MD;  Location: [facility];  Service: Gynecology;  Laterality: N/A;  . ESOPHAGOGASTRODUODENOSCOPY N/A 07/06/2015   Procedure: ESOPHAGOGASTRODUODENOSCOPY (EGD);  Surgeon: Wilford Corner, MD;  Location: [facility] ENDOSCOPY;  Service: Endoscopy;  Laterality: N/A;  . HYSTEROSCOPY N/A 04/24/2013   Procedure: HYSTEROSCOPY;  Surgeon: Guss Bunde, MD;  Location: [facility];  Service: Gynecology;  Laterality: N/A;  . ROBOTIC ASSISTED TOTAL HYSTERECTOMY WITH BILATERAL SALPINGO OOPHERECTOMY Bilateral 06/11/2013   Procedure: ROBOTIC ASSISTED TOTAL HYSTERECTOMY WITH BILATERAL SALPINGO OOPHORECTOMY WITH POSSIBLE STAGING, EPISIOTOMY;  Surgeon: Janie Morning, MD;  Location: [facility];  Service: Gynecology;  Laterality: Bilateral;  . SQUAMOUS CELL CARCINOMA EXCISION    . TEAR DUCT PROBING    . TONSILLECTOMY    . vocal cord poylps  1969   excision    FAMILY HISTORY Family History  Problem Relation Age of Onset  . Heart disease Father   . Cancer Sister 19       Lymphoma  . Breast cancer Sister   .  Cancer Paternal Aunt        Breast cancer  . Cancer Daughter 64       Bilateral Breast Cancer  . Breast cancer Daughter 56  . Cancer Daughter        Breast Cancer  . Breast cancer Daughter   . Cancer Sister        2005 - Renal Cell Cancer and Breast Cancer  . Breast cancer Sister   Patient father was 7 years old when he died from heart issues. Patient mother died from heart issues at age 1. Patient has 3 siblings, 1 brother and 2 sisters.  Patient notes a family hx of breast cancer in both sisters and both daughters. Patient's daughters have had extensive  genetics evaluation with no deleterious mutation identified.    GYNECOLOGIC HISTORY:  No LMP recorded. Patient has had a hysterectomy. Menarche: 87 years old Age at first live birth: 87 years old [place] P 2 LMP 39, roughly 87 years old Contraceptive unknown HRT? No Hysterectomy? Yes, 06/11/2013 BSO? 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  Her husband Mortimer Fries was a Airline pilot.  He died Feb 17, 2020.  They have 2 daughters, [name] and [place]. The patient attends [employer]. She and her husband share 2 grandchildren and 4 great-grandchildren.    ADVANCED DIRECTIVES: Not in place   HEALTH MAINTENANCE: Social History   Tobacco Use  . Smoking status: Former Smoker    Packs/day: 1.00    Years: 30.00    Pack years: 30.00    Quit date: 02/01/1996    Years since quitting: 24.4  . Smokeless tobacco: Never Used  Vaping Use  . Vaping Use: Never used  Substance Use Topics  . Alcohol use: Yes    Alcohol/week: 1.0 standard drink    Types: 1 Glasses of wine per week    Comment: Ocassionaly once a year .  Marland Kitchen Drug use: No     Colonoscopy: 07/09/2015, negative, Dr. Penelope Coop  PAP:  Bone density: Due May 2023   Allergies  Allergen Reactions  . Benadryl [Diphenhydramine Hcl]     Hyper  . Doxycycline Other (See Comments)    acid reflux  . Simvastatin Other (See Comments)    REACTION: memory loss  . Fosamax [Alendronate Sodium] Other (See Comments)    GERD  . Lisinopril Other (See Comments)    Dry cough  . Nsaids Other (See Comments)    GI Bleed    Current Outpatient Medications  Medication Sig Dispense Refill  . acetaminophen (TYLENOL) 325 MG tablet Take 650 mg by mouth as needed for mild pain.     Marland Kitchen albuterol (VENTOLIN HFA) 108 (90 Base) MCG/ACT inhaler Inhale 2 puffs into the lungs every 6 (six) hours as needed for wheezing or shortness of breath. 18 g 1  . anastrozole (ARIMIDEX) 1 MG tablet TAKE 1  TABLET BY MOUTH  DAILY 90 tablet 3  . atorvastatin (LIPITOR) 20 MG tablet TAKE 1 TABLET BY MOUTH  EVERY OTHER NIGHT. 45 tablet 3  . cholecalciferol (VITAMIN D3) 25 MCG (1000 UT) tablet Take 1,000 Units by mouth daily.    . clopidogrel (PLAVIX) 75 MG tablet TAKE 1 TABLET BY MOUTH  DAILY 90 tablet 3  . famotidine (PEPCID) 20 MG tablet TAKE 1 TABLET BY MOUTH  TWICE DAILY 180 tablet 3  . fluticasone (FLONASE) 50 MCG/ACT nasal spray Place 2 sprays into  both nostrils daily. 9.9 mL 3  . losartan (COZAAR) 25 MG tablet TAKE 1 TABLET BY MOUTH  DAILY 90 tablet 3  . Multiple Vitamins-Minerals (ICAPS AREDS 2 PO) Take 1 capsule by mouth 2 (two) times daily at 10 AM and 5 PM. Morning and evening    . VIACTIV 160-109-32 MG-UNT-MCG CHEW CHEW 1 TABLET BY MOUTH EVERY MORNING.  12   No current facility-administered medications for this visit.    OBJECTIVE:  white woman who appears stated age  66:   06/23/20 1418  BP: (!) 146/72  Pulse: (!) 58  Resp: 18  Temp: 97.9 F (36.6 C)  SpO2: 100%     Body mass index is 28.44 kg/m.   Wt Readings from Last 3 Encounters:  06/23/20 170 lb 14.4 oz (77.5 kg)  05/26/20 172 lb (78 kg)  11/26/19 175 lb (79.4 kg)      ECOG FS:1 - Symptomatic but completely ambulatory  Sclerae unicteric, EOMs intact Wearing a mask No cervical or supraclavicular adenopathy Lungs no rales or rhonchi Heart regular rate and rhythm Abd soft, nontender, positive bowel sounds MSK no focal spinal tenderness, no upper extremity lymphedema Neuro: nonfocal, well oriented, appropriate affect Breasts: The right breast has undergone lumpectomy, with no evidence of disease recurrence.  There is a palpable seroma in the upper aspect of the breast, unchanged from baseline.  Left breast and both axillae are benign.   LAB RESULTS:  CMP     Component Value Date/Time   NA 137 11/26/2019 1516   K 4.3 11/26/2019 1516   CL 103 11/26/2019 1516   CO2 27 11/26/2019 1516   GLUCOSE 95  11/26/2019 1516   BUN 11 11/26/2019 1516   BUN 12.3 11/01/2013 1031   CREATININE 0.99 (H) 11/26/2019 1516   CREATININE 0.9 11/01/2013 1031   CALCIUM 9.7 11/26/2019 1516   PROT 6.6 11/26/2019 1516   ALBUMIN 3.6 06/20/2019 1441   AST 18 11/26/2019 1516   AST 18 03/28/2018 1555   ALT 8 11/26/2019 1516   ALT 10 03/28/2018 1555   ALKPHOS 92 06/20/2019 1441   BILITOT 0.4 11/26/2019 1516   BILITOT 0.4 03/28/2018 1555   GFRNONAA 52 (L) 11/26/2019 1516   GFRAA 61 11/26/2019 1516    No results found for: TOTALPROTELP, ALBUMINELP, A1GS, A2GS, BETS, BETA2SER, GAMS, MSPIKE, SPEI  No results found for: KPAFRELGTCHN, LAMBDASER, KAPLAMBRATIO  Lab Results  Component Value Date   WBC 7.7 06/23/2020   NEUTROABS 5.5 06/23/2020   HGB 11.8 (L) 06/23/2020   HCT 34.5 (L) 06/23/2020   MCV 99.1 06/23/2020   PLT 204 06/23/2020   Lab Results  Component Value Date   LABCA2 77 (H) 03/08/2011    No components found for: TFTDDU202  No results for input(s): INR in the last 168 hours.  Lab Results  Component Value Date   LABCA2 77 (H) 03/08/2011    No results found for: RKY706  No results found for: CBJ628  No results found for: BTD176  No results found for: CA2729  No components found for: HGQUANT  No results found for: CEA1 / No results found for: CEA1   No results found for: AFPTUMOR  No results found for: CHROMOGRNA  No results found for: HGBA, HGBA2QUANT, HGBFQUANT, HGBSQUAN (Hemoglobinopathy evaluation)   Lab Results  Component Value Date   LDH 131 07/04/2015    Lab Results  Component Value Date   IRON 179 (H) 07/04/2015   TIBC 249 (L) 07/04/2015   IRONPCTSAT 72 (  H) 07/04/2015   (Iron and TIBC)  Lab Results  Component Value Date   FERRITIN 40 11/26/2019    Urinalysis    Component Value Date/Time   COLORURINE YELLOW 10/21/2013 [place] 10/21/2013 1645   LABSPEC 1.013 10/21/2013 1645   PHURINE 5.5 10/21/2013 1645   GLUCOSEU NEG 10/21/2013  1645   HGBUR NEG 10/21/2013 1645   BILIRUBINUR NEG 10/21/2013 1645   KETONESUR NEG 10/21/2013 1645   PROTEINUR NEG 10/21/2013 1645   UROBILINOGEN 0.2 10/21/2013 1645   NITRITE NEG 10/21/2013 1645   LEUKOCYTESUR TRACE (A) 10/21/2013 1645    STUDIES: MR Brain W Wo Contrast  Result Date: 06/17/2020 CLINICAL DATA:  Memory loss for 6 months.  Prior history malignancy. EXAM: MRI HEAD WITHOUT AND WITH CONTRAST TECHNIQUE: Multiplanar, multiecho pulse sequences of the brain and surrounding structures were obtained without and with intravenous contrast. CONTRAST:  15m MULTIHANCE GADOBENATE DIMEGLUMINE 529 MG/ML IV SOLN COMPARISON:  09/12/2007 FINDINGS: Brain: Generalized cerebral volume loss since prior, subjectively mild for age. In places confluent chronic small vessel ischemia in the cerebral white matter, notably progressed from prior. Known remote left cerebellar infarction. No hemorrhage, hydrocephalus, collection, or masslike finding. No incidental acute or subacute infarct. Vascular: Normal flow voids and vascular enhancements. Skull and upper cervical spine: Normal marrow signal Sinuses/Orbits: Bilateral cataract resection. IMPRESSION: 1. No reversible finding or evidence of metastatic disease. 2. Moderate to extensive chronic small vessel ischemia with notable progression from 2009. 3. Mild cerebral volume loss since prior without specific pattern. Electronically Signed   By: JMonte FantasiaM.D.   On: 06/17/2020 19:48     ELIGIBLE FOR AVAILABLE RESEARCH PROTOCOL: no   ASSESSMENT: 87y.o. [place] woman  (1) status post right upper outer quadrant lumpectomy 03/14/2011 for a pT1b (0.52 cm), pN0 invasive ductal carcinoma, grade 1, estrogen and progesterone receptor positive, HER-2 nonamplified, with an MIB-1 of 10%; a total of 2 axillary lymph nodes were removed.  (a) status post MammoSite radiation between 03/17/2018 and 03/24/2018  (b) opted against adjuvant antiestrogens  (2) status  post total abdominal hysterectomy and bilateral salpingo-oophorectomy 06/11/2013 for a pT1a pN0 invasive endometrioid carcinoma, FIGO grade 1, a total of 3 left and 10 right pelvic lymph nodes all clear  (3) status post right breast upper outer quadrant biopsy 03/14/2018 for a clinical T1c N0 invasive ductal carcinoma, grade 2, strongly estrogen and progesterone receptor positive, HER-2 nonamplified, with an MIB-1 of 10%.  (4) status post right lumpectomy 06/12/2018 for a pT1c pNX invasive ductal carcinoma, grade 2, with the final resection margins negative   (5) anastrozole started 04/01/2018  (a) bone density 12/04/2018 shows a T score of -2.0  (6) opted against adjuvant radiation   PLAN: FSabais now 2 years out from definitive surgery for her second or recurrent right-sided breast cancer.  She is tolerating anastrozole well and the plan is to continue that a total of 5 years.  She is due for repeat bone density later this year but I am ordering it together with her next mammogram at [facility] May 2023.  I do not find genetics testing in [place]  Given her and her daughter's histories I have asked our genetics counselors to find out whether this was done in the past and whether it needs to be updated.  Otherwise she will return to see uKoreain 1 year.  She knows to call for any other issue that may develop before then  Total encounter time 25 minutes.*  Chauncey Cruel, MD   06/23/2020 2:21 PM Medical Oncology and Hematology [facility] [place], [place] [address] Tel. [phone]    Fax. [phone]   I, Wilburn Mylar, am acting as scribe for Dr. [name]. Elianys Conry.  I, Lurline Del MD, have reviewed the above documentation for accuracy and completeness, and I agree with the above.   *Total Encounter Time as defined by the Centers for Medicare and Medicaid Services includes, in addition to the face-to-face time of a patient visit (documented in the  note above) non-face-to-face time: obtaining and reviewing outside history, ordering and reviewing medications, tests or procedures, care coordination (communications with other health care professionals or caregivers) and documentation in the medical record.

## 2020-06-23 ENCOUNTER — Inpatient Hospital Stay: Payer: Medicare Other

## 2020-06-23 ENCOUNTER — Telehealth: Payer: Self-pay | Admitting: Family Medicine

## 2020-06-23 ENCOUNTER — Inpatient Hospital Stay: Payer: Medicare Other | Attending: Oncology | Admitting: Oncology

## 2020-06-23 ENCOUNTER — Other Ambulatory Visit: Payer: Self-pay

## 2020-06-23 VITALS — BP 146/72 | HR 58 | Temp 97.9°F | Resp 18 | Ht 65.0 in | Wt 170.9 lb

## 2020-06-23 DIAGNOSIS — Z170 Estrogen receptor positive status [ER+]: Secondary | ICD-10-CM | POA: Diagnosis not present

## 2020-06-23 DIAGNOSIS — Z85828 Personal history of other malignant neoplasm of skin: Secondary | ICD-10-CM | POA: Diagnosis not present

## 2020-06-23 DIAGNOSIS — Z87891 Personal history of nicotine dependence: Secondary | ICD-10-CM | POA: Insufficient documentation

## 2020-06-23 DIAGNOSIS — M810 Age-related osteoporosis without current pathological fracture: Secondary | ICD-10-CM

## 2020-06-23 DIAGNOSIS — M816 Localized osteoporosis [Lequesne]: Secondary | ICD-10-CM

## 2020-06-23 DIAGNOSIS — C44320 Squamous cell carcinoma of skin of unspecified parts of face: Secondary | ICD-10-CM

## 2020-06-23 DIAGNOSIS — Z923 Personal history of irradiation: Secondary | ICD-10-CM | POA: Insufficient documentation

## 2020-06-23 DIAGNOSIS — C50411 Malignant neoplasm of upper-outer quadrant of right female breast: Secondary | ICD-10-CM | POA: Insufficient documentation

## 2020-06-23 LAB — CBC WITH DIFFERENTIAL/PLATELET
Abs Immature Granulocytes: 0.02 10*3/uL (ref 0.00–0.07)
Basophils Absolute: 0.1 10*3/uL (ref 0.0–0.1)
Basophils Relative: 1 %
Eosinophils Absolute: 0.2 10*3/uL (ref 0.0–0.5)
Eosinophils Relative: 3 %
HCT: 34.5 % — ABNORMAL LOW (ref 36.0–46.0)
Hemoglobin: 11.8 g/dL — ABNORMAL LOW (ref 12.0–15.0)
Immature Granulocytes: 0 %
Lymphocytes Relative: 16 %
Lymphs Abs: 1.3 10*3/uL (ref 0.7–4.0)
MCH: 33.9 pg (ref 26.0–34.0)
MCHC: 34.2 g/dL (ref 30.0–36.0)
MCV: 99.1 fL (ref 80.0–100.0)
Monocytes Absolute: 0.7 10*3/uL (ref 0.1–1.0)
Monocytes Relative: 9 %
Neutro Abs: 5.5 10*3/uL (ref 1.7–7.7)
Neutrophils Relative %: 71 %
Platelets: 204 10*3/uL (ref 150–400)
RBC: 3.48 MIL/uL — ABNORMAL LOW (ref 3.87–5.11)
RDW: 12.4 % (ref 11.5–15.5)
WBC: 7.7 10*3/uL (ref 4.0–10.5)
nRBC: 0 % (ref 0.0–0.2)

## 2020-06-23 LAB — COMPREHENSIVE METABOLIC PANEL
ALT: 9 U/L (ref 0–44)
AST: 18 U/L (ref 15–41)
Albumin: 3.5 g/dL (ref 3.5–5.0)
Alkaline Phosphatase: 88 U/L (ref 38–126)
Anion gap: 6 (ref 5–15)
BUN: 10 mg/dL (ref 8–23)
CO2: 28 mmol/L (ref 22–32)
Calcium: 9.6 mg/dL (ref 8.9–10.3)
Chloride: 104 mmol/L (ref 98–111)
Creatinine, Ser: 0.91 mg/dL (ref 0.44–1.00)
GFR, Estimated: >60 mL/min (ref >60)
Glucose, Bld: 97 mg/dL (ref 70–99)
Potassium: 4.4 mmol/L (ref 3.5–5.1)
Sodium: 138 mmol/L (ref 135–145)
Total Bilirubin: 0.4 mg/dL (ref 0.3–1.2)
Total Protein: 6.4 g/dL — ABNORMAL LOW (ref 6.5–8.1)

## 2020-06-23 LAB — VITAMIN D 25 HYDROXY (VIT D DEFICIENCY, FRACTURES): Vit D, 25-Hydroxy: 66.31 ng/mL (ref 30–100)

## 2020-06-23 NOTE — Telephone Encounter (Signed)
Please call patient's daughter [name] and let her know that I did speak with a colleague who had recommended [facility].  She is located off [address] in [place] not sure if that is too far out of the way but I think it could be really helpful.  She does have a brief website that you could also take a look at.

## 2020-06-24 ENCOUNTER — Other Ambulatory Visit: Payer: Self-pay | Admitting: Genetic Counselor

## 2020-06-24 DIAGNOSIS — Z170 Estrogen receptor positive status [ER+]: Secondary | ICD-10-CM

## 2020-06-24 DIAGNOSIS — C50411 Malignant neoplasm of upper-outer quadrant of right female breast: Secondary | ICD-10-CM

## 2020-06-24 NOTE — Telephone Encounter (Signed)
Spoke w/Cindy and advised her of recommendations. She was thankful and will check this out.

## 2020-06-27 ENCOUNTER — Other Ambulatory Visit: Payer: Self-pay | Admitting: Oncology

## 2020-06-30 ENCOUNTER — Other Ambulatory Visit: Payer: Self-pay | Admitting: *Deleted

## 2020-07-05 DIAGNOSIS — H353132 Nonexudative age-related macular degeneration, bilateral, intermediate dry stage: Secondary | ICD-10-CM | POA: Diagnosis not present

## 2020-07-07 ENCOUNTER — Encounter: Payer: Self-pay | Admitting: Family Medicine

## 2020-08-04 ENCOUNTER — Telehealth: Payer: Self-pay | Admitting: Family Medicine

## 2020-08-04 DIAGNOSIS — H353132 Nonexudative age-related macular degeneration, bilateral, intermediate dry stage: Secondary | ICD-10-CM | POA: Diagnosis not present

## 2020-08-04 NOTE — Progress Notes (Signed)
  Chronic Care Management   Outreach Note  08/04/2020 Name: Michelle [name] MRN: [id number] DOB: [birth year]  Referred by: Hali Marry, MD Reason for referral : No chief complaint on file.   An unsuccessful telephone outreach was attempted today. The patient was referred to the pharmacist for assistance with care management and care coordination.   Follow Up Plan:   Lauretta Grill Upstream Scheduler

## 2020-08-11 ENCOUNTER — Telehealth: Payer: Self-pay | Admitting: Family Medicine

## 2020-08-11 NOTE — Chronic Care Management (AMB) (Signed)
  Chronic Care Management   Note  08/11/2020 Name: Michelle [name] MRN: [id number] DOB: Jan 10, 1933  Michelle [name] is a 87 y.o. year old female who is a primary care patient of Metheney, Rene Kocher, MD. I reached out to [name] by phone today in response to a referral sent by [name]'s PCP, Hali Marry, MD.   [name] was given information about Chronic Care Management services today including:  CCM service includes personalized support from designated clinical staff supervised by her physician, including individualized plan of care and coordination with other care providers 24/7 contact phone numbers for assistance for urgent and routine care needs. Service will only be billed when office clinical staff spend 20 minutes or more in a month to coordinate care. Only one practitioner may furnish and bill the service in a calendar month. The patient may stop CCM services at any time (effective at the end of the month) by phone call to the office staff.   Patient wishes to consider information provided and/or speak with a member of the care team before deciding about enrollment in care management services.   Follow up plan:   Lauretta Grill Upstream Scheduler

## 2020-08-24 ENCOUNTER — Encounter: Payer: Self-pay | Admitting: Family Medicine

## 2020-08-25 ENCOUNTER — Encounter: Payer: Self-pay | Admitting: Family Medicine

## 2020-08-25 ENCOUNTER — Other Ambulatory Visit: Payer: Self-pay

## 2020-08-25 ENCOUNTER — Ambulatory Visit (INDEPENDENT_AMBULATORY_CARE_PROVIDER_SITE_OTHER): Payer: Medicare Other | Admitting: Family Medicine

## 2020-08-25 VITALS — BP 126/63 | HR 69 | Ht 65.0 in | Wt 167.0 lb

## 2020-08-25 DIAGNOSIS — I10 Essential (primary) hypertension: Secondary | ICD-10-CM | POA: Diagnosis not present

## 2020-08-25 DIAGNOSIS — D509 Iron deficiency anemia, unspecified: Secondary | ICD-10-CM | POA: Diagnosis not present

## 2020-08-25 DIAGNOSIS — G3184 Mild cognitive impairment, so stated: Secondary | ICD-10-CM | POA: Diagnosis not present

## 2020-08-25 DIAGNOSIS — R059 Cough, unspecified: Secondary | ICD-10-CM | POA: Diagnosis not present

## 2020-08-25 DIAGNOSIS — F4321 Adjustment disorder with depressed mood: Secondary | ICD-10-CM

## 2020-08-25 MED ORDER — AMLODIPINE BESYLATE 2.5 MG PO TABS: 2.5000 mg | ORAL_TABLET | Freq: Every day | ORAL | 0 refills | Status: DC

## 2020-08-25 NOTE — Assessment & Plan Note (Signed)
Pressure looks fantastic today on her current regimen but we are going to make a temporary change for 30 days just to make sure that her medication is not contributing to her current cough.

## 2020-08-25 NOTE — Patient Instructions (Signed)
Please hold your losartan for 30 days and take the amlodipine 2.5 mg daily in its place.  At the end of the month if you can let me know if you feel if the throat clearing/cough is better or not.

## 2020-08-25 NOTE — Assessment & Plan Note (Signed)
Plan to recheck iron level.  She has an a consultation appointment with Dr.  Delice Lesch in 2 days and they will likely do some labs maybe she is would be able to check those iron levels.

## 2020-08-25 NOTE — Progress Notes (Signed)
cough  Established Patient Office Visit  Subjective:  Patient ID: Michelle [name], female    DOB: 12/02/1932  Age: 87 y.o. MRN: [id number]  CC:  Chief Complaint  Patient presents with   Follow-up    3 month follow up [place]     HPI Michelle [name] presents for   Follow-up mild cognitive impairment-she does have an appointment with Dr. Laren Boom who later this week for consultation for memory impairment.  Hypertension- Pt denies chest pain, SOB, dizziness, or heart palpitations.  Taking meds as directed w/o problems.  Denies medication side effect   she does feel like she has had a cough which she really describes as more of a throat clearing.  She denies any recent heartburn or reflux or postnasal drip.   As I last saw her she has now moved in with one of her daughters and feels like that has actually been good for her she said its been challenging giving up her home of 50 years but feels like overall it has been good she has been sleeping and resting better she says she does not feel nearly as depressed that she is still grieving for her husband.  Past Medical History:  Diagnosis Date   AAA (abdominal aortic aneurysm) ([place]) 10/2013   Anxiety    Basal cell carcinoma 2011   R back   Bell's palsy 1984   Bradycardia    Breast cancer ([place]) 02/2011   US-guided biopsy   Cataract    Cholecystitis 4098   Complication of anesthesia    Endometrial cancer ([place]) 05/2013   uterine ca   GERD (gastroesophageal reflux disease)    Hiatal hernia 2008   History of breast cancer    History of cerebrovascular accident 2009   Hyperlipidemia    Hypertension 2012   Macular degeneration    Polyp of colon 2008, 2014   Benign   PONV (postoperative nausea and vomiting)    Shingles 2008   Squamous cell carcinoma    2010-2015   Squamous cell skin cancer, multiple sites 2014   recurrent   Stroke ([place]) 09/2007   Slight memory problems residual   Tendonitis    rt hand   Vocal cord polyp     Past  Surgical History:  Procedure Laterality Date   APPENDECTOMY     BASAL CELL CARCINOMA EXCISION     biospy  10/28/09   Shave Biospy skin Left hand(Acitinic Keratoses), Right Upper Arm (superficial Basal Cell), Right Upper Back(Superficial Basal Cell), Left Neck ( Solar Lentigo & Seborrheic Keratoses)   BREAST BIOPSY  02/11/11   Right Breast Needle Core Biospy - Upper Outer Quadrant; ER/PR 100%, Her-2 Neu neg.; Ki-67 10%   BREAST LUMPECTOMY WITH RADIOACTIVE SEED LOCALIZATION Right 06/12/2018   Procedure: RIGHT BREAST LUMPECTOMY WITH RADIOACTIVE SEED LOCALIZATION;  Surgeon: Rolm Bookbinder, MD;  Location: [facility];  Service: General;  Laterality: Right;   BREAST MAMMOSITE  03/14/2011   Procedure: MAMMOSITE BREAST;  Surgeon: Rolm Bookbinder, MD;  Location: [facility];  Service: General;  Laterality: Right;   BREAST SURGERY     right breast lumpectomy snbx   CHOLECYSTECTOMY  2004   COLONOSCOPY     COLONOSCOPY W/ POLYPECTOMY     2008, 2014 (benign)   COLONOSCOPY WITH PROPOFOL N/A 07/09/2015   Procedure: COLONOSCOPY WITH PROPOFOL;  Surgeon: Wonda Horner, MD;  Location: [facility];  Service: Endoscopy;  Laterality: N/A;   DILATION AND CURETTAGE OF UTERUS N/A 04/24/2013  Procedure: DILATATION AND CURETTAGE;  Surgeon: Guss Bunde, MD;  Location: [facility];  Service: Gynecology;  Laterality: N/A;   ESOPHAGOGASTRODUODENOSCOPY N/A 07/06/2015   Procedure: ESOPHAGOGASTRODUODENOSCOPY (EGD);  Surgeon: Wilford Corner, MD;  Location: [facility];  Service: Endoscopy;  Laterality: N/A;   HYSTEROSCOPY N/A 04/24/2013   Procedure: HYSTEROSCOPY;  Surgeon: Guss Bunde, MD;  Location: [facility];  Service: Gynecology;  Laterality: N/A;   ROBOTIC ASSISTED TOTAL HYSTERECTOMY WITH BILATERAL SALPINGO OOPHERECTOMY Bilateral 06/11/2013   Procedure: ROBOTIC ASSISTED TOTAL HYSTERECTOMY WITH BILATERAL SALPINGO OOPHORECTOMY WITH POSSIBLE STAGING, EPISIOTOMY;  Surgeon: Janie Morning, MD;   Location: [facility];  Service: Gynecology;  Laterality: Bilateral;   SQUAMOUS CELL CARCINOMA EXCISION     TEAR DUCT PROBING     TONSILLECTOMY     vocal cord poylps  1969   excision    Family History  Problem Relation Age of Onset   Heart disease Father    Cancer Sister 79       Lymphoma   Breast cancer Sister    Cancer Paternal Aunt        Breast cancer   Cancer Daughter 9       Bilateral Breast Cancer   Breast cancer Daughter 64   Cancer Daughter        Breast Cancer   Breast cancer Daughter    Cancer Sister        2005 - Renal Cell Cancer and Breast Cancer   Breast cancer Sister     Social History   Socioeconomic History   Marital status: Widowed    Spouse name: Not on file   Number of children: Not on file   Years of education: Not on file   Highest education level: Not on file  Occupational History   Occupation: retired  Tobacco Use   Smoking status: Former    Packs/day: 1.00    Years: 30.00    Pack years: 30.00    Types: Cigarettes    Quit date: 02/01/1996    Years since quitting: 24.5   Smokeless tobacco: Never  Vaping Use   Vaping Use: Never used  Substance and Sexual Activity   Alcohol use: Not Currently    Alcohol/week: 1.0 standard drink    Types: 1 Glasses of wine per week    Comment: Ocassionaly once a year .   Drug use: No   Sexual activity: Yes    Partners: Male    Comment: Menarche age 9, menopause age 48, HRT x 1year  Other Topics Concern   Not on file  Social History Narrative   Not on file   Social Determinants of Health   Financial Resource Strain: Not on file  Food Insecurity: Not on file  Transportation Needs: Not on file  Physical Activity: Not on file  Stress: Not on file  Social Connections: Not on file  Intimate Partner Violence: Not on file    Outpatient Medications Prior to Visit  Medication Sig Dispense Refill   acetaminophen (TYLENOL) 325 MG tablet Take 650 mg by mouth as needed for mild pain.      albuterol  (VENTOLIN HFA) 108 (90 Base) MCG/ACT inhaler Inhale 2 puffs into the lungs every 6 (six) hours as needed for wheezing or shortness of breath. 18 g 1   anastrozole (ARIMIDEX) 1 MG tablet TAKE 1 TABLET BY MOUTH  DAILY 90 tablet 3   atorvastatin (LIPITOR) 20 MG tablet TAKE 1 TABLET BY MOUTH  EVERY OTHER NIGHT. 45 tablet 3  cholecalciferol (VITAMIN D3) 25 MCG (1000 UT) tablet Take 1,000 Units by mouth daily.     clopidogrel (PLAVIX) 75 MG tablet TAKE 1 TABLET BY MOUTH  DAILY 90 tablet 3   famotidine (PEPCID) 20 MG tablet TAKE 1 TABLET BY MOUTH  TWICE DAILY 180 tablet 3   fluticasone (FLONASE) 50 MCG/ACT nasal spray Place 2 sprays into both nostrils daily. 9.9 mL 3   losartan (COZAAR) 25 MG tablet TAKE 1 TABLET BY MOUTH  DAILY 90 tablet 3   Multiple Vitamins-Minerals (ICAPS AREDS 2 PO) Take 1 capsule by mouth 2 (two) times daily at 10 AM and 5 PM. Morning and evening     VIACTIV 500-500-40 MG-UNT-MCG CHEW CHEW 1 TABLET BY MOUTH EVERY MORNING.  12   No facility-administered medications prior to visit.    Allergies  Allergen Reactions   Benadryl [Diphenhydramine Hcl]     Hyper   Doxycycline Other (See Comments)    acid reflux   Simvastatin Other (See Comments)    REACTION: memory loss   Fosamax [Alendronate Sodium] Other (See Comments)    GERD   Lisinopril Other (See Comments)    Dry cough   Nsaids Other (See Comments)    GI Bleed    ROS Review of Systems    Objective:    Physical Exam Constitutional:      Appearance: Normal appearance. She is well-developed.  HENT:     Head: Normocephalic and atraumatic.  Cardiovascular:     Rate and Rhythm: Normal rate and regular rhythm.     Heart sounds: Normal heart sounds.  Pulmonary:     Effort: Pulmonary effort is normal.     Breath sounds: Normal breath sounds.  Skin:    General: Skin is warm and dry.  Neurological:     Mental Status: She is alert and oriented to person, place, and time.  Psychiatric:        Behavior: Behavior  normal.    BP 126/63   Pulse 69   Ht _0  (1.651 m)   Wt 167 lb 0.6 oz (75.8 kg)   SpO2 98%   BMI 27.80 kg/m  Wt Readings from Last 3 Encounters:  08/25/20 167 lb 0.6 oz (75.8 kg)  06/23/20 170 lb 14.4 oz (77.5 kg)  05/26/20 172 lb (78 kg)     Health Maintenance Due  Topic Date Due   Zoster Vaccines- Shingrix (1 of 2) Never done    There are no preventive care reminders to display for this patient.  Lab Results  Component Value Date   TSH 2.609 10/21/2013   Lab Results  Component Value Date   WBC 7.7 06/23/2020   HGB 11.8 (L) 06/23/2020   HCT 34.5 (L) 06/23/2020   MCV 99.1 06/23/2020   PLT 204 06/23/2020   Lab Results  Component Value Date   NA 138 06/23/2020   K 4.4 06/23/2020   CO2 28 06/23/2020   GLUCOSE 97 06/23/2020   BUN 10 06/23/2020   CREATININE 0.91 06/23/2020   BILITOT 0.4 06/23/2020   ALKPHOS 88 06/23/2020   AST 18 06/23/2020   ALT 9 06/23/2020   PROT 6.4 (L) 06/23/2020   ALBUMIN 3.5 06/23/2020   CALCIUM 9.6 06/23/2020   ANIONGAP 6 06/23/2020   GFR 71.90 09/07/2011   Lab Results  Component Value Date   CHOL 140 11/26/2019   Lab Results  Component Value Date   HDL 53 11/26/2019   Lab Results  Component Value Date   LDLCALC  68 11/26/2019   Lab Results  Component Value Date   TRIG 108 11/26/2019   Lab Results  Component Value Date   CHOLHDL 2.6 11/26/2019   No results found for: HGBA1C    Assessment & Plan:   Problem List Items Addressed This Visit       Cardiovascular and Mediastinum   Essential hypertension, benign    Pressure looks fantastic today on her current regimen but we are going to make a temporary change for 30 days just to make sure that her medication is not contributing to her current cough.       Relevant Medications   amLODipine (NORVASC) 2.5 MG tablet   Other Relevant Orders   TSH   RPR     Other   Mild cognitive impairment - Primary    Has appt with Dr. Delice Lesch. Still needs TSH, RPR drawn. Was  not collected with labs in May so hopefully DR. A can order.        Relevant Orders   RPR   Iron deficiency anemia, unspecified    Plan to recheck iron level.  She has an a consultation appointment with Dr.  Delice Lesch in 2 days and they will likely do some labs maybe she is would be able to check those iron levels.       Relevant Orders   Fe+TIBC+Fer   Other Visit Diagnoses     Cough       Grief          Grief - she is doing better overall.     Meds ordered this encounter  Medications   amLODipine (NORVASC) 2.5 MG tablet    Sig: Take 1 tablet (2.5 mg total) by mouth daily. This is in place of losartan for 30 days    Dispense:  30 tablet    Refill:  0   Cough  -discussed that it could be secondary to her blood pressure medication.  Organ to try switching her to a calcium channel blocker in its place for 30 days just to see if she notices a difference.  If there is no difference then consider other causes such as GERD which she does have a prior history of, or postnasal drip.  Follow-up: Return in about 6 months (around 02/25/2021) for Hypertension.    Beatrice Lecher, MD

## 2020-08-25 NOTE — Assessment & Plan Note (Signed)
Has appt with Dr. Delice Lesch. Still needs TSH, RPR drawn. Was not collected with labs in May so hopefully DR. A can order.

## 2020-08-27 ENCOUNTER — Ambulatory Visit: Payer: Medicare Other | Admitting: Physician Assistant

## 2020-08-27 ENCOUNTER — Other Ambulatory Visit: Payer: Medicare Other

## 2020-08-27 ENCOUNTER — Other Ambulatory Visit: Payer: Self-pay

## 2020-08-27 ENCOUNTER — Other Ambulatory Visit (INDEPENDENT_AMBULATORY_CARE_PROVIDER_SITE_OTHER): Payer: Medicare Other

## 2020-08-27 ENCOUNTER — Encounter: Payer: Self-pay | Admitting: Physician Assistant

## 2020-08-27 VITALS — BP 149/76 | HR 76 | Resp 18 | Ht 67.0 in | Wt 168.0 lb

## 2020-08-27 DIAGNOSIS — R413 Other amnesia: Secondary | ICD-10-CM

## 2020-08-27 LAB — FERRITIN: Ferritin: 36.2 ng/mL (ref 10.0–291.0)

## 2020-08-27 LAB — TSH: TSH: 2.48 u[IU]/mL (ref 0.35–5.50)

## 2020-08-27 LAB — VITAMIN B12: Vitamin B-12: 294 pg/mL (ref 211–911)

## 2020-08-27 NOTE — Patient Instructions (Addendum)
It was a pleasure to see you today at our office.   Recommendations:  Neurocognitive evaluation at our office Check the labs today  Follow up once the results of the above are available   RECOMMENDATIONS FOR ALL PATIENTS WITH MEMORY PROBLEMS: 1. Continue to exercise (Recommend 30 minutes of walking everyday, or 3 hours every week) 2. Increase social interactions - continue going to [place] and enjoy social gatherings with friends and family 3. Eat healthy, avoid fried foods and eat more fruits and vegetables 4. Maintain adequate blood pressure, blood sugar, and blood cholesterol level. Reducing the risk of stroke and cardiovascular disease also helps promoting better memory. 5. Avoid stressful situations. Live a simple life and avoid aggravations. Organize your time and prepare for the next day in anticipation. 6. Sleep well, avoid any interruptions of sleep and avoid any distractions in the bedroom that may interfere with adequate sleep quality 7. Avoid sugar, avoid sweets as there is a strong link between excessive sugar intake, diabetes, and cognitive impairment We discussed the Mediterranean diet, which has been shown to help patients reduce the risk of progressive memory disorders and reduces cardiovascular risk. This includes eating fish, eat fruits and green leafy vegetables, nuts like almonds and hazelnuts, walnuts, and also use olive oil. Avoid fast foods and fried foods as much as possible. Avoid sweets and sugar as sugar use has been linked to worsening of memory function.  There is always a concern of gradual progression of memory problems. If this is the case, then we may need to adjust level of care according to patient needs. Support, both to the patient and caregiver, should then be put into place.      You have been referred for a neuropsychological evaluation (i.e., evaluation of memory and thinking abilities). Please bring someone with you to this appointment if possible, as  it is helpful for the doctor to hear from both you and another adult who knows you well. Please bring eyeglasses and hearing aids if you wear them.    The evaluation will take approximately 3 hours and has two parts:   The first part is a clinical interview with the neuropsychologist (Dr. Melvyn Novas or Dr. Nicole Kindred). During the interview, the neuropsychologist will speak with you and the individual you brought to the appointment.    The second part of the evaluation is testing with the doctor's technician Hinton Dyer or Maudie Mercury). During the testing, the technician will ask you to remember different types of material, solve problems, and answer some questionnaires. Your family member will not be present for this portion of the evaluation.   Please note: We must reserve several hours of the neuropsychologist's time and the psychometrician's time for your evaluation appointment. As such, there is a No-Show fee of $100. If you are unable to attend any of your appointments, please contact our office as soon as possible to reschedule.    FALL PRECAUTIONS: Be cautious when walking. Scan the area for obstacles that may increase the risk of trips and falls. When getting up in the mornings, sit up at the edge of the bed for a few minutes before getting out of bed. Consider elevating the bed at the head end to avoid drop of blood pressure when getting up. Walk always in a well-lit room (use night lights in the walls). Avoid area rugs or power cords from appliances in the middle of the walkways. Use a walker or a cane if necessary and consider physical therapy for balance exercise.  Get your eyesight checked regularly.  FINANCIAL OVERSIGHT: Supervision, especially oversight when making financial decisions or transactions is also recommended.  HOME SAFETY: Consider the safety of the kitchen when operating appliances like stoves, microwave oven, and blender. Consider having supervision and share cooking responsibilities until no  longer able to participate in those. Accidents with firearms and other hazards in the house should be identified and addressed as well.   ABILITY TO BE LEFT ALONE: If patient is unable to contact 911 operator, consider using LifeLine, or when the need is there, arrange for someone to stay with patients. Smoking is a fire hazard, consider supervision or cessation. Risk of wandering should be assessed by caregiver and if detected at any point, supervision and safe proof recommendations should be instituted.  MEDICATION SUPERVISION: Inability to self-administer medication needs to be constantly addressed. Implement a mechanism to ensure safe administration of the medications.   DRIVING: Regarding driving, in patients with progressive memory problems, driving will be impaired. We advise to have someone else do the driving if trouble finding directions or if minor accidents are reported. Independent driving assessment is available to determine safety of driving.   If you are interested in the driving assessment, you can contact the following:  [employer] in [place]  [place] [phone] or [phone]    [place] refers to food and lifestyle choices that are based on the traditions of countries located on the [employer]. This way of eating has been shown to help prevent certain conditions and improve outcomes for people who have chronic diseases, like kidney disease and heart disease. What are tips for following this plan? Lifestyle  Cook and eat meals together with your family, when possible. Drink enough fluid to keep your urine clear or pale yellow. Be physically active every day. This includes: Aerobic exercise like running or swimming. Leisure activities like gardening, walking, or housework. Get 7-8 hours of sleep each  night. If recommended by your health care provider, drink red wine in moderation. This means 1 glass a day for nonpregnant women and 2 glasses a day for men. A glass of wine equals 5 oz (150 mL). Reading food labels  Check the serving size of packaged foods. For foods such as rice and pasta, the serving size refers to the amount of cooked product, not dry. Check the total fat in packaged foods. Avoid foods that have saturated fat or trans fats. Check the ingredients list for added sugars, such as corn syrup. Shopping  At the grocery store, buy most of your food from the areas near the walls of the store. This includes: Fresh fruits and vegetables (produce). Grains, beans, nuts, and seeds. Some of these may be available in unpackaged forms or large amounts (in bulk). Fresh seafood. Poultry and eggs. Low-fat dairy products. Buy whole ingredients instead of prepackaged foods. Buy fresh fruits and vegetables in-season from local farmers markets. Buy frozen fruits and vegetables in resealable bags. If you do not have access to quality fresh seafood, buy precooked frozen shrimp or canned fish, such as tuna, salmon, or sardines. Buy small amounts of raw or cooked vegetables, salads, or olives from the deli or salad bar at your store. Stock your pantry so you always have certain foods on hand, such as olive oil, canned tuna, canned tomatoes, rice, pasta, and beans. Cooking  Cook foods with extra-virgin olive oil instead of using butter  or other vegetable oils. Have meat as a side dish, and have vegetables or grains as your main dish. This means having meat in small portions or adding small amounts of meat to foods like pasta or stew. Use beans or vegetables instead of meat in common dishes like chili or lasagna. Experiment with different cooking methods. Try roasting or broiling vegetables instead of steaming or sauteing them. Add frozen vegetables to soups, stews, pasta, or rice. Add nuts or seeds  for added healthy fat at each meal. You can add these to yogurt, salads, or vegetable dishes. Marinate fish or vegetables using olive oil, lemon juice, garlic, and fresh herbs. Meal planning  Plan to eat 1 vegetarian meal one day each week. Try to work up to 2 vegetarian meals, if possible. Eat seafood 2 or more times a week. Have healthy snacks readily available, such as: Vegetable sticks with hummus. Greek yogurt. Fruit and nut trail mix. Eat balanced meals throughout the week. This includes: Fruit: 2-3 servings a day Vegetables: 4-5 servings a day Low-fat dairy: 2 servings a day Fish, poultry, or lean meat: 1 serving a day Beans and legumes: 2 or more servings a week Nuts and seeds: 1-2 servings a day Whole grains: 6-8 servings a day Extra-virgin olive oil: 3-4 servings a day Limit red meat and sweets to only a few servings a month What are my food choices? Mediterranean diet Recommended Grains: Whole-grain pasta. Brown rice. Bulgar wheat. Polenta. Couscous. Whole-wheat bread. [place]. Vegetables: Artichokes. Beets. Broccoli. Cabbage. Carrots. Eggplant. Green beans. Chard. Kale. Spinach. Onions. Leeks. Peas. Squash. Tomatoes. Peppers. Radishes. Fruits: Apples. Apricots. Avocado. Berries. Bananas. Cherries. Dates. Figs. Grapes. Lemons. Melon. Oranges. Peaches. Plums. Pomegranate. Meats and other protein foods: Beans. Almonds. Sunflower seeds. Pine nuts. Peanuts. [place]. Salmon. Scallops. Shrimp. [place]. Tilapia. Clams. Oysters. Eggs. Dairy: Low-fat milk. Cheese. Greek yogurt. Beverages: Water. Red wine. Herbal tea. Fats and oils: Extra virgin olive oil. Avocado oil. Grape seed oil. Sweets and desserts: [place] yogurt with honey. Baked apples. Poached pears. Trail mix. Seasoning and other foods: Basil. Cilantro. Coriander. Cumin. Mint. Parsley. Sage. Rosemary. Tarragon. Garlic. Oregano. Thyme. Pepper. Balsalmic vinegar. Tahini. Hummus. Tomato sauce. Olives. Mushrooms. Limit  these Grains: Prepackaged pasta or rice dishes. Prepackaged cereal with added sugar. Vegetables: Deep fried potatoes (french fries). Fruits: Fruit canned in syrup. Meats and other protein foods: Beef. Pork. Lamb. Poultry with skin. Hot dogs. Berniece Salines. Dairy: Ice cream. Sour cream. Whole milk. Beverages: Juice. Sugar-sweetened soft drinks. Beer. Liquor and spirits. Fats and oils: Butter. Canola oil. Vegetable oil. Beef fat (tallow). Lard. Sweets and desserts: Cookies. Cakes. Pies. Candy. Seasoning and other foods: Mayonnaise. Premade sauces and marinades. The items listed may not be a complete list. Talk with your dietitian about what dietary choices are right for you. Summary The Mediterranean diet includes both food and lifestyle choices. Eat a variety of fresh fruits and vegetables, beans, nuts, seeds, and whole grains. Limit the amount of red meat and sweets that you eat. Talk with your health care provider about whether it is safe for you to drink red wine in moderation. This means 1 glass a day for nonpregnant women and 2 glasses a day for men. A glass of wine equals 5 oz (150 mL). This information is not intended to replace advice given to you by your health care provider. Make sure you discuss any questions you have with your health care provider. Document Released: 09/10/2015 Document Revised: 10/13/2015 Document Reviewed: 09/10/2015 Elsevier Interactive Patient Education  2017 [place] provider has requested that you have labwork completed today. Please go to [facility] ([id number]) on the second floor of this building before leaving the office today. You do not need to check in. If you are not called within 15 minutes please check with the front desk.

## 2020-08-27 NOTE — Progress Notes (Addendum)
Assessment/Plan:   Michelle [name] is a 87 y.o. year old female with risk factors including  age, hypertension, hyperlipidemia, AAA, osteoporosis, h/o CVA 2009 with residual memory loss, R breast Cancer on anastrozole and h/o Endometrial Ca, Iron deficiency Anemia, COVID 01/21/2019, seen today for evaluation of memory loss. MoCA today is 24/30 with deficiencies in visuospatial/executive, memory, abstraction, delayed recall  1/5, orientation  6/6 . Prior assessments showed  MMSE 08/2018 and 11/2018 were 29/30 (equivalent to MoCA 26/30). Symptoms suggestive of mild cognitive impairment. MRI of the brain with and without contrast 06/16/2020 was negative for metastatic disease, with mild cerebral volume loss since prior, and no acute findings, or evidence of metastasis.  Moderate to extensive chronic small vessel ischemia with notable progression since 2009 was noted.   Recommendations:   Mild cognitive impairment likely of vascular etiology without behavioral disturbance  Check B12, TSH, RPR, ferritin Neurocognitive testing to further evaluate cognitive concerns and determine underlying cause of memory changes, including potential contribution from sleep, anxiety, or depression  Discussed safety both in and out of the home.  Discussed the importance of regular daily schedule with inclusion of crossword puzzles to maintain brain function.  Continue to monitor mood with PCP.  Stay active at least 30 minutes at least 3 times a week.  Naps should be scheduled and should be no longer than 60 minutes and should not occur after 2 PM.  Mediterranean diet is recommended  Folllow up once results above are available   Subjective:    The patient is seen in neurologic consultation at the request of Hali Marry, * for the evaluation of memory.  The patient is accompanied by daughter Michelle [name] who supplements the history.  Her other daughter [name] is on the phone with [place].  The patient is an 87 y.o. year old  female who has had memory issues since her stroke in 2009.  However, the symptoms became more noticeable after a day sudden death of her husband to massive heart attack in December 2021.  This caused significant emotional stress on the patient and she reports "now I am fine mainly coming out of it, but it was a rough 6 or 7 months ".  At that time, she could not sleep more than 3 hours a night, and was losing the confidence of being on my own, increasingly anxious, and having less patience ".  She moved a couple of months ago to be with her daughter, and now she is beginning to increase her activity, and going out of the house more often, although she feels that the memory loss "is still there ". She has been seen by her PCP, who suspects mild cognitive impairment.  She is in no medications at this time.  She has never been seen by a neurologist until now. Her daughter reports that she repeats the same questions, also tells the same stories for at least a year. Denies hallucinations or paranoia.  She denies acting out in her dreams, or sleepwalking.  Denies issues with bathing or dressing.  Denies missing any medications.  She does her own finances and denies missing any meals.  She denies leaving objects in unusual places.  Her appetite is good, denies trouble swallowing.  She cooks, and denies leaving the stove or the faucet on.  She used to drive until recently, but "the daughter took the keys of the car because she is afraid I am going to get into an accident " [place] states  that there was one episode where she did not recall how to get to the hair salon despite having known this place for a long time, in which the patient interjects "well I just moved to a different place and I am trying to learn my way around, and the roads changed ".  She ambulates without difficulty without a walker or a cane, and denies any recent falls, or trauma to the head. She denies any double vision, dizziness, focal numbness or  tingling, unilateral weakness or tremors.  She denies anosmia.  Denies a history of sleep apnea, alcohol.  She quit in 1998 the use of tobacco.  Family history remarkable for maternal grandfather with dementia. She has 12th grade education.  Labs 06/23/2020 Vitamin D 25 hydroxy 66.  31, normal CMP normal CBC remarkable for hemoglobin 11.8, hematocrit 34.5 remarkable for mild anemia  MRI of the brain with and without contrast 06/16/2020 for memory loss 1. No reversible finding or evidence of metastatic disease. 2. Moderate to extensive chronic small vessel ischemia with notable progression from 2009. 3. Mild cerebral volume loss since prior without specific pattern.     Allergies  Allergen Reactions   Benadryl [Diphenhydramine Hcl]     Hyper   Doxycycline Other (See Comments)    acid reflux   Simvastatin Other (See Comments)    REACTION: memory loss   Fosamax [Alendronate Sodium] Other (See Comments)    GERD   Lisinopril Other (See Comments)    Dry cough   Nsaids Other (See Comments)    GI Bleed    Current Outpatient Medications  Medication Instructions   acetaminophen (TYLENOL) 650 mg, Oral, As needed   albuterol (VENTOLIN HFA) 108 (90 Base) MCG/ACT inhaler 2 puffs, Inhalation, Every 6 hours PRN   amLODipine (NORVASC) 2.5 mg, Oral, Daily, This is in place of losartan for 30 days   anastrozole (ARIMIDEX) 1 MG tablet TAKE 1 TABLET BY MOUTH  DAILY   atorvastatin (LIPITOR) 20 MG tablet TAKE 1 TABLET BY MOUTH  EVERY OTHER NIGHT.   cholecalciferol (VITAMIN D3) 1,000 Units, Oral, Daily   clopidogrel (PLAVIX) 75 MG tablet TAKE 1 TABLET BY MOUTH  DAILY   famotidine (PEPCID) 20 MG tablet TAKE 1 TABLET BY MOUTH  TWICE DAILY   fluticasone (FLONASE) 50 MCG/ACT nasal spray 2 sprays, Each Nare, Daily   losartan (COZAAR) 25 MG tablet TAKE 1 TABLET BY MOUTH  DAILY   Multiple Vitamins-Minerals (ICAPS AREDS 2 PO) 1 capsule, Oral, 2 times daily, Morning and evening   VIACTIV 500-500-40  MG-UNT-MCG CHEW CHEW 1 TABLET BY MOUTH EVERY MORNING.     VITALS:   Vitals:   08/27/20 1023  BP: (!) 149/76  Pulse: 76  Resp: 18  SpO2: 98%  Weight: 168 lb (76.2 kg)  Height: '5\' 7"'$  (1.702 m)    PHYSICAL EXAM  General  well-developed well-nourished 87 year old woman in no acute distress, looks appropriate for her stated age, well groomed HEENT:  Normocephalic, atraumatic. The mucous membranes are moist. The superficial temporal arteries are without ropiness or tenderness. Cardiovascular: Regular rate and rhythm. Lungs: Clear to auscultation bilaterally. Neck: There are no carotid bruits noted bilaterally.  NEUROLOGICAL: Montreal Cognitive Assessment  08/27/2020  Visuospatial/ Executive (0/5) 4  Naming (0/3) 3  Attention: Read list of digits (0/2) 2  Attention: Read list of letters (0/1) 1  Attention: Serial 7 subtraction starting at 100 (0/3) 2  Language: Repeat phrase (0/2) 2  Language : Fluency (0/1) 0  Abstraction (0/2)  2  Delayed Recall (0/5) 1  Orientation (0/6) 6  Total 23  Adjusted Score (based on education) 24   MMSE - Mini Mental State Exam 11/26/2019 08/14/2018  Orientation to time 4 4  Orientation to Place 5 5  Registration 3 3  Attention/ Calculation 5 5  Recall 3 3  Language- name 2 objects 2 2  Language- repeat 1 1  Language- follow 3 step command 3 3  Language- read & follow direction 1 1  Write a sentence 1 1  Copy design 1 1  Total score 29 29    No flowsheet data found.   Orientation:  Alert and oriented to person, place and time. No aphasia or dysarthria. Fund of knowledge is appropriate. Recent memory impaired and remote memory intact.  Attention and concentration are normal.  Able to name objects and repeat phrases. Delayed recall  1 /5 Cranial nerves: There is good facial symmetry. Extraocular muscles are intact and visual fields are full to confrontational testing. Speech is fluent and clear. Soft palate rises symmetrically and there is no  tongue deviation. Hearing is intact to conversational tone. Tone: Tone is good throughout. Sensation: Sensation is intact to light touch and pinprick throughout. Vibration is intact at the bilateral big toe.There is no extinction with double simultaneous stimulation. There is no sensory dermatomal level identified. Coordination: The patient has no difficulty with RAM's or FNF bilaterally. Normal finger to nose  Motor: Strength is 5/5 in the bilateral upper and lower extremities. There is no pronator drift. There are no fasciculations noted. DTR's: Deep tendon reflexes are 2/4 at the bilateral biceps, triceps, brachioradialis, patella and achilles.  Plantar responses are downgoing bilaterally. Gait and Station: The patient is able to ambulate without difficulty.The patient is able to heel toe walk without any difficulty.The patient is able to ambulate in a tandem fashion. The patient is able to stand in the Romberg position.     Thank you for allowing [place] the opportunity to participate in the care of this nice patient. Please do not hesitate to contact us for any questions or concerns.   Total time spent on today's visit was  60 minutes, including both face-to-face time and nonface-to-face time.  Time included that spent on review of records (prior notes available to me/labs/imaging if pertinent), discussing treatment and goals, answering patient's questions and coordinating care.  Cc:  Hali Marry, MD  Sharene Butters 08/27/2020 11:15 AM

## 2020-09-03 DIAGNOSIS — H353132 Nonexudative age-related macular degeneration, bilateral, intermediate dry stage: Secondary | ICD-10-CM | POA: Diagnosis not present

## 2020-09-08 DIAGNOSIS — L820 Inflamed seborrheic keratosis: Secondary | ICD-10-CM | POA: Diagnosis not present

## 2020-09-08 DIAGNOSIS — L570 Actinic keratosis: Secondary | ICD-10-CM | POA: Diagnosis not present

## 2020-09-16 DIAGNOSIS — H353132 Nonexudative age-related macular degeneration, bilateral, intermediate dry stage: Secondary | ICD-10-CM | POA: Diagnosis not present

## 2020-09-21 ENCOUNTER — Other Ambulatory Visit: Payer: Self-pay | Admitting: Family Medicine

## 2020-09-21 DIAGNOSIS — I10 Essential (primary) hypertension: Secondary | ICD-10-CM

## 2020-09-23 NOTE — Telephone Encounter (Signed)
Spoke with pts daughter [name] (on [place] form) who states that since changing from losartan to amlodipine that there was not a change in her cough. Because of this, the pt and her daughter would like to switch back to losartan. According to her chart, the losartan was sent in on 03/13/20 for #90 with 3 refills.

## 2020-10-03 DIAGNOSIS — H353132 Nonexudative age-related macular degeneration, bilateral, intermediate dry stage: Secondary | ICD-10-CM | POA: Diagnosis not present

## 2020-10-08 ENCOUNTER — Telehealth: Payer: Self-pay | Admitting: Family Medicine

## 2020-10-08 NOTE — Telephone Encounter (Signed)
Spoke with daughter cindy to schedule Medicare Annual Wellness Visit (AWV) either virtually or in office.  Not a good time to talk she will call the office to schedule    Last AWV ;05/12/15  please schedule at anytime with health coach

## 2020-11-02 DIAGNOSIS — H353132 Nonexudative age-related macular degeneration, bilateral, intermediate dry stage: Secondary | ICD-10-CM | POA: Diagnosis not present

## 2020-11-03 ENCOUNTER — Encounter: Payer: Self-pay | Admitting: Family Medicine

## 2020-11-18 ENCOUNTER — Encounter: Payer: Self-pay | Admitting: Family Medicine

## 2020-11-18 DIAGNOSIS — C44729 Squamous cell carcinoma of skin of left lower limb, including hip: Secondary | ICD-10-CM | POA: Diagnosis not present

## 2020-11-25 ENCOUNTER — Encounter (INDEPENDENT_AMBULATORY_CARE_PROVIDER_SITE_OTHER): Payer: Medicare Other | Admitting: Ophthalmology

## 2020-11-25 ENCOUNTER — Other Ambulatory Visit: Payer: Self-pay

## 2020-11-25 DIAGNOSIS — H353132 Nonexudative age-related macular degeneration, bilateral, intermediate dry stage: Secondary | ICD-10-CM

## 2020-11-25 DIAGNOSIS — H43813 Vitreous degeneration, bilateral: Secondary | ICD-10-CM

## 2020-11-25 DIAGNOSIS — I10 Essential (primary) hypertension: Secondary | ICD-10-CM

## 2020-11-25 DIAGNOSIS — H35033 Hypertensive retinopathy, bilateral: Secondary | ICD-10-CM | POA: Diagnosis not present

## 2020-11-25 DIAGNOSIS — D3132 Benign neoplasm of left choroid: Secondary | ICD-10-CM

## 2020-12-02 DIAGNOSIS — H353132 Nonexudative age-related macular degeneration, bilateral, intermediate dry stage: Secondary | ICD-10-CM | POA: Diagnosis not present

## 2020-12-30 ENCOUNTER — Other Ambulatory Visit: Payer: Self-pay | Admitting: Family Medicine

## 2021-01-01 DIAGNOSIS — H353132 Nonexudative age-related macular degeneration, bilateral, intermediate dry stage: Secondary | ICD-10-CM | POA: Diagnosis not present

## 2021-01-31 DIAGNOSIS — H353132 Nonexudative age-related macular degeneration, bilateral, intermediate dry stage: Secondary | ICD-10-CM | POA: Diagnosis not present

## 2021-02-02 ENCOUNTER — Ambulatory Visit (INDEPENDENT_AMBULATORY_CARE_PROVIDER_SITE_OTHER): Payer: Medicare Other | Admitting: Family Medicine

## 2021-02-02 DIAGNOSIS — Z0000 Encounter for general adult medical examination without abnormal findings: Secondary | ICD-10-CM | POA: Diagnosis not present

## 2021-02-02 NOTE — Patient Instructions (Signed)
[name] Maintenance Summary and Written Plan of Care  Michelle [name] ,  Thank you for allowing me to perform your Medicare Annual Wellness Visit and for your ongoing commitment to your health.   Health Maintenance & Immunization History Health Maintenance  Topic Date Due   COVID-19 Vaccine (5 - Booster for Moderna series) 02/18/2021 (Originally 10/15/2020)   Zoster Vaccines- Shingrix (1 of 2) 05/03/2021 (Originally 12/10/1951)   MAMMOGRAM  06/19/2022   TETANUS/TDAP  09/12/2029   Pneumonia Vaccine 48+ Years old  Completed   INFLUENZA VACCINE  Completed   DEXA SCAN  Completed   HPV VACCINES  Aged Out   COLONOSCOPY (Pts 45-33yrs Insurance coverage will need to be confirmed)  Discontinued   Immunization History  Administered Date(s) Administered   Fluad Quad(high Dose 65+) 11/15/2018, 10/29/2019   Influenza Split 12/08/2010, 11/01/2011, 11/07/2017   Influenza Whole 12/13/2006, 11/19/2008, 11/11/2009   Influenza, High Dose Seasonal PF 11/17/2016   Influenza,inj,Quad PF,6+ Mos 11/22/2012, 11/19/2013, 10/21/2014, 11/10/2015, 11/07/2017   Influenza-Unspecified 11/03/2020   Moderna Sars-Covid-2 Vaccination 03/05/2019, 04/04/2019, 12/18/2019, 08/20/2020   Pneumococcal Conjugate-13 02/18/2014   Pneumococcal Polysaccharide-23 12/13/2006   Tdap 10/06/2010, 09/13/2019   Zoster, Live 12/08/2010    These are the patient goals that we discussed:  Goals Addressed               This Visit's Progress     Patient Stated (pt-stated)        02/02/2021 AWV Goal: Exercise for General Health  Patient will verbalize understanding of the benefits of increased physical activity: Exercising regularly is important. It will improve your overall fitness, flexibility, and endurance. Regular exercise also will improve your overall health. It can help you control your weight, reduce stress, and improve your bone density. Over the next year, patient will increase physical  activity as tolerated with a goal of at least 150 minutes of moderate physical activity per week.  You can tell that you are exercising at a moderate intensity if your heart starts beating faster and you start breathing faster but can still hold a conversation. Moderate-intensity exercise ideas include: Walking 1 mile (1.6 km) in about 15 minutes Biking Hiking Golfing Dancing Water aerobics Patient will verbalize understanding of everyday activities that increase physical activity by providing examples like the following: Yard work, such as: Sales promotion account executive Gardening Washing windows or floors Patient will be able to explain general safety guidelines for exercising:  Before you start a new exercise program, talk with your health care provider. Do not exercise so much that you hurt yourself, feel dizzy, or get very short of breath. Wear comfortable clothes and wear shoes with good support. Drink plenty of water while you exercise to prevent dehydration or heat stroke. Work out until your breathing and your heartbeat get faster.          This is a list of Health Maintenance Items that are overdue or due now: Shingrix  Orders/Referrals Placed Today: No orders of the defined types were placed in this encounter.  (Contact our referral department at [phone] if you have not spoken with someone about your referral appointment within the next 5 days)    Follow-up Plan Follow-up with Hali Marry, MD as planned Schedule your shingrix vaccine at your pharmacy.  Scheduled for bone density in May, 2023.  Medicare wellness visit in one year. AVS printed and mailed to the patient.  Health Maintenance, Female Adopting a healthy lifestyle and getting preventive care are important in promoting health and wellness. Ask your health care provider about: The right schedule for you to have  regular tests and exams. Things you can do on your own to prevent diseases and keep yourself healthy. What should I know about diet, weight, and exercise? Eat a healthy diet  Eat a diet that includes plenty of vegetables, fruits, low-fat dairy products, and lean protein. Do not eat a lot of foods that are high in solid fats, added sugars, or sodium. Maintain a healthy weight Body mass index (BMI) is used to identify weight problems. It estimates body fat based on height and weight. Your health care provider can help determine your BMI and help you achieve or maintain a healthy weight. Get regular exercise Get regular exercise. This is one of the most important things you can do for your health. Most adults should: Exercise for at least 150 minutes each week. The exercise should increase your heart rate and make you sweat (moderate-intensity exercise). Do strengthening exercises at least twice a week. This is in addition to the moderate-intensity exercise. Spend less time sitting. Even light physical activity can be beneficial. Watch cholesterol and blood lipids Have your blood tested for lipids and cholesterol at 86 years of age, then have this test every 5 years. Have your cholesterol levels checked more often if: Your lipid or cholesterol levels are high. You are older than 86 years of age. You are at high risk for heart disease. What should I know about cancer screening? Depending on your health history and family history, you may need to have cancer screening at various ages. This may include screening for: Breast cancer. Cervical cancer. Colorectal cancer. Skin cancer. Lung cancer. What should I know about heart disease, diabetes, and high blood pressure? Blood pressure and heart disease High blood pressure causes heart disease and increases the risk of stroke. This is more likely to develop in people who have high blood pressure readings or are overweight. Have your blood pressure  checked: Every 3-5 years if you are 11-37 years of age. Every year if you are 36 years old or older. Diabetes Have regular diabetes screenings. This checks your fasting blood sugar level. Have the screening done: Once every three years after age 34 if you are at a normal weight and have a low risk for diabetes. More often and at a younger age if you are overweight or have a high risk for diabetes. What should I know about preventing infection? Hepatitis B If you have a higher risk for hepatitis B, you should be screened for this virus. Talk with your health care provider to find out if you are at risk for hepatitis B infection. Hepatitis C Testing is recommended for: Everyone born from [birth year] through 1963. Anyone with known risk factors for hepatitis C. Sexually transmitted infections (STIs) Get screened for STIs, including gonorrhea and chlamydia, if: You are sexually active and are younger than 86 years of age. You are older than 86 years of age and your health care provider tells you that you are at risk for this type of infection. Your sexual activity has changed since you were last screened, and you are at increased risk for chlamydia or gonorrhea. Ask your health care provider if you are at risk. Ask your health care provider about whether you are at high risk for HIV. Your health care provider may recommend a prescription medicine to help prevent  HIV infection. If you choose to take medicine to prevent HIV, you should first get tested for HIV. You should then be tested every 3 months for as long as you are taking the medicine. Pregnancy If you are about to stop having your period (premenopausal) and you may become pregnant, seek counseling before you get pregnant. Take 400 to 800 micrograms (mcg) of folic acid every day if you become pregnant. Ask for birth control (contraception) if you want to prevent pregnancy. Osteoporosis and menopause Osteoporosis is a disease in which the bones  lose minerals and strength with aging. This can result in bone fractures. If you are 54 years old or older, or if you are at risk for osteoporosis and fractures, ask your health care provider if you should: Be screened for bone loss. Take a calcium or vitamin D supplement to lower your risk of fractures. Be given hormone replacement therapy (HRT) to treat symptoms of menopause. Follow these instructions at home: Alcohol use Do not drink alcohol if: Your health care provider tells you not to drink. You are pregnant, may be pregnant, or are planning to become pregnant. If you drink alcohol: Limit how much you have to: 0-1 drink a day. Know how much alcohol is in your drink. In the [place] one drink equals one 12 oz bottle of beer (355 mL), one 5 oz glass of wine (148 mL), or one 1 oz glass of hard liquor (44 mL). Lifestyle Do not use any products that contain nicotine or tobacco. These products include cigarettes, chewing tobacco, and vaping devices, such as e-cigarettes. If you need help quitting, ask your health care provider. Do not use street drugs. Do not share needles. Ask your health care provider for help if you need support or information about quitting drugs. General instructions Schedule regular health, dental, and eye exams. Stay current with your vaccines. Tell your health care provider if: You often feel depressed. You have ever been abused or do not feel safe at home. Summary Adopting a healthy lifestyle and getting preventive care are important in promoting health and wellness. Follow your health care provider's instructions about healthy diet, exercising, and getting tested or screened for diseases. Follow your health care provider's instructions on monitoring your cholesterol and blood pressure. This information is not intended to replace advice given to you by your health care provider. Make sure you discuss any questions you have with your health care provider. Document  Revised: 06/08/2020 Document Reviewed: 06/08/2020 Elsevier Patient Education  [place].

## 2021-02-02 NOTE — Progress Notes (Signed)
MEDICARE ANNUAL WELLNESS VISIT  02/02/2021  Telephone Visit Disclaimer This Medicare AWV was conducted by telephone due to national recommendations for restrictions regarding the COVID-19 Pandemic (e.g. social distancing).  I verified, using two identifiers, that I am speaking with Michelle [name] or their authorized healthcare agent. I discussed the limitations, risks, security, and privacy concerns of performing an evaluation and management service by telephone and the potential availability of an in-person appointment in the future. The patient expressed understanding and agreed to proceed.  Location of Patient: Home Location of Provider (nurse):  Provider home  Subjective:    Michelle [name] is a 88 y.o. female patient of Metheney, Rene Kocher, MD who had a Medicare Annual Wellness Visit today via telephone. Michelle [name] is Retired and lives with their daughter. she has 2 children. she reports that she is socially active and does interact with friends/family regularly. she is minimally physically active and enjoys spending time with family and friends.  Patient Care Team: Hali Marry, MD as PCP - General (Family Medicine) Rolm Bookbinder, MD as Attending Physician (Dermatology) Monna Fam, MD as Attending Physician (Ophthalmology) Magrinat, [name], MD as Consulting Physician (Oncology) Rolm Bookbinder, MD as Consulting Physician (General Surgery) Kyung Rudd, MD as Consulting Physician (Radiation Oncology) Ronald Lobo, MD as Consulting Physician (Gastroenterology)  Advanced Directives 02/02/2021 06/04/2018 04/18/2018 03/29/2018 09/23/2015 07/01/2015 05/12/2015  Does Patient Have a Medical Advance Directive? _0  Yes Yes  Type of Advance Directive Living will;Healthcare Power of [place];Living will [place];Living will [place];Living will [place];Living will [place];Living will [place];Living will  Does patient want to make changes to medical advance directive? No - Patient declined - No - Patient declined No - Patient declined - No - Patient declined No - Patient declined  Copy of [place] in Chart? No - copy requested No - copy requested No - copy requested No - copy requested No - copy requested No - copy requested No - copy requested  Pre-existing out of facility DNR order (yellow form or pink MOST form) - - - - - - [facility] Utilization Over the Past 12 Months: # of hospitalizations or ER visits: 0 # of surgeries: 0  Review of Systems    Patient reports that her overall health is unchanged compared to last year.  History obtained from chart review and the patient  Patient Reported Readings (BP, Pulse, CBG, Weight, etc) none  Pain Assessment Pain : No/denies pain     Current Medications & Allergies (verified) Allergies as of 02/02/2021       Reactions   Benadryl [diphenhydramine Hcl]    Hyper   Doxycycline Other (See Comments)   acid reflux   Simvastatin Other (See Comments)   REACTION: memory loss   Fosamax [alendronate Sodium] Other (See Comments)   GERD   Lisinopril Other (See Comments)   Dry cough   Nsaids Other (See Comments)   GI Bleed        Medication List        Accurate as of February 02, 2021 11:51 AM. If you have any questions, ask your nurse or doctor.          acetaminophen 325 MG tablet Commonly known as: TYLENOL Take 650 mg by mouth as needed for mild pain.   albuterol 108 (90 Base) MCG/ACT inhaler Commonly  known as: VENTOLIN HFA Inhale 2 puffs into the lungs every 6 (six) hours as needed for wheezing or shortness of breath.   amLODipine 2.5 MG tablet Commonly known as: NORVASC Take 1 tablet (2.5 mg total) by mouth daily. This is in place of losartan for 30 days   anastrozole 1 MG tablet Commonly known as: ARIMIDEX TAKE 1 TABLET BY MOUTH   DAILY   atorvastatin 20 MG tablet Commonly known as: LIPITOR TAKE 1 TABLET BY MOUTH  EVERY OTHER NIGHT.   cholecalciferol 25 MCG (1000 UNIT) tablet Commonly known as: VITAMIN D3 Take 1,000 Units by mouth daily.   clopidogrel 75 MG tablet Commonly known as: PLAVIX TAKE 1 TABLET BY MOUTH  DAILY   famotidine 20 MG tablet Commonly known as: PEPCID TAKE 1 TABLET BY MOUTH  TWICE DAILY   fluticasone 50 MCG/ACT nasal spray Commonly known as: FLONASE Place 2 sprays into both nostrils daily.   ICAPS AREDS 2 PO Take 1 capsule by mouth 2 (two) times daily at 10 AM and 5 PM. Morning and evening   losartan 25 MG tablet Commonly known as: COZAAR TAKE 1 TABLET BY MOUTH  DAILY   Viactiv [id number] MG-UNT-MCG Chew Generic drug: Calcium-Vitamin D-Vitamin K CHEW 1 TABLET BY MOUTH EVERY MORNING.        History (reviewed): Past Medical History:  Diagnosis Date   AAA (abdominal aortic aneurysm) 10/2013   Allergy    Anxiety    Arthritis Hips lower back   Basal cell carcinoma 2011   R back   Bell's palsy 1984   Blood transfusion without reported diagnosis 07/2015   Bradycardia    Breast cancer ([place]) 02/2011   US-guided biopsy   Cataract    Cholecystitis 2004   Clotting disorder ([place]) Blood thinner   Complication of anesthesia    COPD (chronic obstructive pulmonary disease) ([place])    Depression 01/2020   Endometrial cancer ([place]) 05/2013   uterine ca   GERD (gastroesophageal reflux disease)    Hiatal hernia 2008   History of breast cancer    History of cerebrovascular accident 2009   Hyperlipidemia    Hypertension 2012   Macular degeneration    Polyp of colon 2008, 2014   Benign   PONV (postoperative nausea and vomiting)    Shingles 2008   Squamous cell carcinoma    2010-2015   Squamous cell skin cancer, multiple sites 2014   recurrent   Stroke ([place]) 09/2007   Slight memory problems residual   Tendonitis    rt hand   Vocal cord polyp    Past Surgical History:   Procedure Laterality Date   ABDOMINAL HYSTERECTOMY  2005   APPENDECTOMY     BASAL CELL CARCINOMA EXCISION     biospy  10/28/2009   Shave Biospy skin Left hand(Acitinic Keratoses), Right Upper Arm (superficial Basal Cell), Right Upper Back(Superficial Basal Cell), Left Neck ( Solar Lentigo & Seborrheic Keratoses)   BREAST BIOPSY  02/11/2011   Right Breast Needle Core Biospy - Upper Outer Quadrant; ER/PR 100%, Her-2 Neu neg.; Ki-67 10%   BREAST LUMPECTOMY WITH RADIOACTIVE SEED LOCALIZATION Right 06/12/2018   Procedure: RIGHT BREAST LUMPECTOMY WITH RADIOACTIVE SEED LOCALIZATION;  Surgeon: Rolm Bookbinder, MD;  Location: [place];  Service: General;  Laterality: Right;   BREAST MAMMOSITE  03/14/2011   Procedure: MAMMOSITE BREAST;  Surgeon: Rolm Bookbinder, MD;  Location: [place];  Service: General;  Laterality: Right;   BREAST SURGERY  right breast lumpectomy snbx   CHOLECYSTECTOMY  2004   COLONOSCOPY     COLONOSCOPY W/ POLYPECTOMY     2008, 2014 (benign)   COLONOSCOPY WITH PROPOFOL N/A 07/09/2015   Procedure: COLONOSCOPY WITH PROPOFOL;  Surgeon: Wonda Horner, MD;  [facility] END[facility]COPY;  Service: Endoscopy;  Laterality: N/A;   DILATION AND CURETTAGE OF UTERUS N/A 04/24/2013   Procedure: DILATATION AND CURETTAGE;  Surgeon: Guss Bunde, MD;  Location: [facility];  Service: Gynecology;  Laterality: N/A;   ESOPHAGOGASTRODUODENOSCOPY N/A 07/06/2015   Procedure: ESOPHAGOGASTRODUODENOSCOPY (EGD);  Surgeon: Wilford Corner, MD;  Location[facility];  Service: Endoscopy;  Laterality: N/A;   HYSTEROSCOPY N/A 04/24/2013   Procedure: HYSTEROSCOPY;  Surgeon: Guss Bunde, MD;  Location: [facility];  Service: Gynecology;  Laterality: N/A;   ROBOTIC ASSISTED TOTAL HYSTERECTOMY WITH BILATERAL SALPINGO OOPHERECTOMY Bilateral 06/11/2013   Procedure: ROBOTIC ASSISTED TOTAL HYSTERECTOMY WITH BILATERAL SALPINGO OOPHORECTOMY WITH POSSIBLE STAGING, EPISIOTOMY;   Surgeon: Janie Morning, MD;  Location: [facility];  Service: Gynecology;  Laterality: Bilateral;   SQUAMOUS CELL CARCINOMA EXCISION     TEAR DUCT PROBING     TONSILLECTOMY     vocal cord poylps  1969   excision   Family History  Problem Relation Age of Onset   Heart disease Father    Cancer Sister        Lymphoma   Breast cancer Sister    Cancer Paternal Aunt        Breast cancer   Cancer Daughter        Bilateral Breast Cancer   Breast cancer Daughter 3   Cancer Daughter        Breast Cancer   Breast cancer Daughter    Cancer Sister        2005 - Renal Cell Cancer and Breast Cancer   Breast cancer Sister    Social History   Socioeconomic History   Marital status: Widowed    Spouse name: Not on file   Number of children: 2   Years of education: 35   Highest education level: Some college, no degree  Occupational History   Occupation: retired  Tobacco Use   Smoking status: Former    Packs/day: 1.00    Years: 30.00    Pack years: 30.00    Types: Cigarettes    Quit date: 02/01/1996    Years since quitting: 25.0   Smokeless tobacco: Never  Vaping Use   Vaping Use: Never used  Substance and Sexual Activity   Alcohol use: Not Currently    Alcohol/week: 1.0 standard drink    Types: 1 Glasses of wine per week    Comment: Ocassionaly once a year .   Drug use: No   Sexual activity: Yes    Partners: Male    Birth control/protection: Surgical, None    Comment: Menarche age 29, menopause age 41, HRT x 1year  Other Topics Concern   Not on file  Social History Narrative   Lives with her daughter. Her husband passed away one year ago. It has been a difficult year for her. She does go to church and stays in touch with her friends. She enjoys spending time with her friends and family.   Social Determinants of Health   Financial Resource Strain: Low Risk    Difficulty of Paying Living Expenses: Not hard at all  Food Insecurity: No Food Insecurity   Worried About Ship broker in the Last Year: Never true  Ran Out of Food in the Last Year: Never true  Transportation Needs: No Transportation Needs   Lack of Transportation (Medical): No   Lack of Transportation (Non-Medical): No  Physical Activity: Inactive   Days of Exercise per Week: 0 days   Minutes of Exercise per Session: 0 min  Stress: Stress Concern Present   Feeling of Stress : To some extent  Social Connections: Moderately Isolated   Frequency of Communication with Friends and Family: More than three times a week   Frequency of Social Gatherings with Friends and Family: More than three times a week   Attends Religious Services: More than 4 times per year   Active Member of [employer] or Organizations: No   Attends Archivist Meetings: Never   Marital Status: Widowed    Activities of Daily Living In your present state of health, do you have any difficulty performing the following activities: 02/02/2021 02/01/2021  Hearing? N N  Vision? N N  Difficulty concentrating or making decisions? Y Y  Comment has noticed some memory loss. -  Walking or climbing stairs? Y N  Comment difficulty climbing on stairs. -  Dressing or bathing? N N  Doing errands, shopping? Y Y  Comment she can still drive but her daughter doesn't [place]. -  Preparing Food and eating ? N N  Using the Toilet? N N  In the past six months, have you accidently leaked urine? Y -  Comment uses pads for just incase she has an accident. -  Do you have problems with loss of bowel control? N Y  Managing your Medications? N N  Managing your Finances? N N  Housekeeping or managing your Housekeeping? N N  Some recent data might be hidden    Patient Education/ Literacy How often do you need to have someone help you when you read instructions, pamphlets, or other written materials from your doctor or pharmacy?: 1 - Never What is the last grade level you completed in school?: 12th grade/some  college  Exercise Current Exercise Habits: The patient does not participate in regular exercise at present, Exercise limited by: None identified  Diet Patient reports consuming 2 meals a day and 1 snack(s) a day Patient reports that her primary diet is: Regular Patient reports that she does have regular access to food.   Depression Screen PHQ 2/9 Scores 02/02/2021 05/26/2020 07/26/2019 06/05/2018 05/25/2017 11/17/2016 05/10/2016  PHQ - 2 Score 0 1 0 0 0 0 0  PHQ- 9 Score - - 0 - - - -     Fall Risk Fall Risk  02/02/2021 02/01/2021 08/25/2020 08/25/2020 05/26/2020  Falls in the past year? 0 0 0 0 0  Number falls in past yr: 0 0 0 0 -  Injury with Fall? 0 0 0 0 -  Risk for fall due to : No Fall Risks - - - No Fall Risks  Follow up Falls evaluation completed - Falls evaluation completed Falls evaluation completed Falls evaluation completed     Objective:  Michelle [name] seemed alert and oriented and she participated appropriately during our telephone visit.  Blood Pressure Weight BMI  BP Readings from Last 3 Encounters:  08/27/20 (!) 149/76  08/25/20 126/63  06/23/20 (!) 146/72   Wt Readings from Last 3 Encounters:  08/27/20 168 lb (76.2 kg)  08/25/20 167 lb 0.6 oz (75.8 kg)  06/23/20 170 lb 14.4 oz (77.5 kg)   BMI Readings from Last 1 Encounters:  08/27/20 26.31 kg/m    *Unable to obtain current vital signs, weight, and BMI due to telephone visit type  Hearing/Vision  Michelle [name] did not seem to have difficulty with hearing/understanding during the telephone conversation Reports that she has had a formal eye exam by an eye care professional within the past year Reports that she has not had a formal hearing evaluation within the past year *Unable to fully assess hearing and vision during telephone visit type  Cognitive Function: 6CIT Screen 02/02/2021  What Year? 0 points  What month? 0 points  What time? 0 points  Count back from 20 0 points  Months in reverse 0 points  Repeat phrase 0  points  Total Score 0   (Normal:0-7, Significant for Dysfunction: >8)  Normal Cognitive Function Screening: Yes   Immunization & Health Maintenance Record Immunization History  Administered Date(s) Administered   Fluad Quad(high Dose 65+) 11/15/2018, 10/29/2019   Influenza Split 12/08/2010, 11/01/2011, 11/07/2017   Influenza Whole 12/13/2006, 11/19/2008, 11/11/2009   Influenza, High Dose Seasonal PF 11/17/2016   Influenza,inj,Quad PF,6+ Mos 11/22/2012, 11/19/2013, 10/21/2014, 11/10/2015, 11/07/2017   Influenza-Unspecified 11/03/2020   Moderna Sars-Covid-2 Vaccination 03/05/2019, 04/04/2019, 12/18/2019, 08/20/2020   Pneumococcal Conjugate-13 02/18/2014   Pneumococcal Polysaccharide-23 12/13/2006   Tdap 10/06/2010, 09/13/2019   Zoster, Live 12/08/2010    Health Maintenance  Topic Date Due   COVID-19 Vaccine (5 - Booster for Moderna series) 02/18/2021 (Originally 10/15/2020)   Zoster Vaccines- Shingrix (1 of 2) 05/03/2021 (Originally 12/10/1951)   MAMMOGRAM  06/19/2022   TETANUS/TDAP  09/12/2029   Pneumonia Vaccine 35+ Years old  Completed   INFLUENZA VACCINE  Completed   DEXA SCAN  Completed   HPV VACCINES  Aged Out   COLONOSCOPY (Pts 45-17yr Insurance coverage will need to be confirmed)  Discontinued       Assessment  This is a routine wellness examination for [employer]  Health Maintenance: Due or Overdue There are no preventive care reminders to display for this patient.   FArvil Personsdoes not need a referral for Community Assistance: Care Management:   no Social Work:    no Prescription Assistance:  no Nutrition/Diabetes Education:  no   Plan:  Personalized Goals  Goals Addressed               This Visit's Progress     Patient Stated (pt-stated)        02/02/2021 AWV Goal: Exercise for General Health  Patient will verbalize understanding of the benefits of increased physical activity: Exercising regularly is important. It will improve your overall  fitness, flexibility, and endurance. Regular exercise also will improve your overall health. It can help you control your weight, reduce stress, and improve your bone density. Over the next year, patient will increase physical activity as tolerated with a goal of at least 150 minutes of moderate physical activity per week.  You can tell that you are exercising at a moderate intensity if your heart starts beating faster and you start breathing faster but can still hold a conversation. Moderate-intensity exercise ideas include: Walking 1 mile (1.6 km) in about 15 minutes Biking Hiking Golfing Dancing Water aerobics Patient will verbalize understanding of everyday activities that increase physical activity by providing examples like the following: Yard work, such as: PSales promotion account executiveGardening Washing windows or floors Patient will be able to explain general safety guidelines for exercising:  Before you start a  new exercise program, talk with your health care provider. Do not exercise so much that you hurt yourself, feel dizzy, or get very short of breath. Wear comfortable clothes and wear shoes with good support. Drink plenty of water while you exercise to prevent dehydration or heat stroke. Work out until your breathing and your heartbeat get faster.        Personalized Health Maintenance & Screening Recommendations  Shingrix  Lung Cancer Screening Recommended: no (Low Dose CT Chest recommended if Age 90-80 years, 30 pack-year currently smoking OR have quit w/in past 15 years) Hepatitis C Screening recommended: no HIV Screening recommended: no  Advanced Directives: Written information was not prepared per patient's request.  Referrals & Orders No orders of the defined types were placed in this encounter.   Follow-up Plan Follow-up with Hali Marry, MD as planned Schedule your  shingrix vaccine at your pharmacy.  Scheduled for bone density in May, 2023.  Medicare wellness visit in one year. AVS printed and mailed to the patient.   I have personally reviewed and noted the following in the patients chart:   Medical and social history Use of alcohol, tobacco or illicit drugs  Current medications and supplements Functional ability and status Nutritional status Physical activity Advanced directives List of other physicians Hospitalizations, surgeries, and ER visits in previous 12 months Vitals Screenings to include cognitive, depression, and falls Referrals and appointments  In addition, I have reviewed and discussed with Michelle [name] certain preventive protocols, quality metrics, and best practice recommendations. A written personalized care plan for preventive services as well as general preventive health recommendations is available and can be mailed to the patient at her request.      Tinnie Gens, RN  02/02/2021

## 2021-02-22 DIAGNOSIS — C44729 Squamous cell carcinoma of skin of left lower limb, including hip: Secondary | ICD-10-CM | POA: Diagnosis not present

## 2021-02-22 DIAGNOSIS — C44319 Basal cell carcinoma of skin of other parts of face: Secondary | ICD-10-CM | POA: Diagnosis not present

## 2021-02-22 DIAGNOSIS — C44311 Basal cell carcinoma of skin of nose: Secondary | ICD-10-CM | POA: Diagnosis not present

## 2021-02-22 DIAGNOSIS — Z85828 Personal history of other malignant neoplasm of skin: Secondary | ICD-10-CM | POA: Diagnosis not present

## 2021-02-22 DIAGNOSIS — L570 Actinic keratosis: Secondary | ICD-10-CM | POA: Diagnosis not present

## 2021-02-25 ENCOUNTER — Encounter: Payer: Self-pay | Admitting: Family Medicine

## 2021-02-25 ENCOUNTER — Ambulatory Visit (INDEPENDENT_AMBULATORY_CARE_PROVIDER_SITE_OTHER): Payer: Medicare Other | Admitting: Family Medicine

## 2021-02-25 ENCOUNTER — Other Ambulatory Visit: Payer: Self-pay

## 2021-02-25 VITALS — BP 135/64 | HR 87 | Resp 18 | Ht 67.0 in | Wt 169.0 lb

## 2021-02-25 DIAGNOSIS — F4321 Adjustment disorder with depressed mood: Secondary | ICD-10-CM

## 2021-02-25 DIAGNOSIS — I10 Essential (primary) hypertension: Secondary | ICD-10-CM | POA: Diagnosis not present

## 2021-02-25 DIAGNOSIS — D509 Iron deficiency anemia, unspecified: Secondary | ICD-10-CM

## 2021-02-25 DIAGNOSIS — K219 Gastro-esophageal reflux disease without esophagitis: Secondary | ICD-10-CM

## 2021-02-25 DIAGNOSIS — E538 Deficiency of other specified B group vitamins: Secondary | ICD-10-CM

## 2021-02-25 DIAGNOSIS — J849 Interstitial pulmonary disease, unspecified: Secondary | ICD-10-CM | POA: Diagnosis not present

## 2021-02-25 DIAGNOSIS — I7143 Infrarenal abdominal aortic aneurysm, without rupture: Secondary | ICD-10-CM | POA: Diagnosis not present

## 2021-02-25 NOTE — Progress Notes (Signed)
° °Established Patient Office Visit ° °Subjective:  °Patient ID: Michelle [name], female    DOB: 07/15/1932  Age: 88 y.o. MRN: [id number] ° °CC:  °Chief Complaint  °Patient presents with  ° Hypertension  °  Follow up   ° ° °HPI °Michelle [name] presents for 6 mo f/u  ° °Hypertension- Pt denies chest pain, SOB, dizziness, or heart palpitations.  Taking meds as directed w/o problems.  Denies medication side effects.   ° °Follow-up interstitial lung disease-she says she is actually doing well she has not had any shortness of breath or exacerbations.  In fact she has not even used her albuterol in a really long time.  It might even be expired. ° °GERD-she still takes her Pepcid regularly. ° °Past Medical History:  °Diagnosis Date  ° AAA (abdominal aortic aneurysm) 10/2013  ° Allergy   ° Anxiety   ° Arthritis Hips lower back  ° Basal cell carcinoma 2011  ° R back  ° Bell's palsy 1984  ° Blood transfusion without reported diagnosis 07/2015  ° Bradycardia   ° Breast cancer (HCC) 02/2011  ° US-guided biopsy  ° Cataract   ° Cholecystitis 2004  ° Clotting disorder (HCC) Blood thinner  ° Complication of anesthesia   ° COPD (chronic obstructive pulmonary disease) (HCC)   ° Depression 01/2020  ° Endometrial cancer (HCC) 05/2013  ° uterine ca  ° GERD (gastroesophageal reflux disease)   ° Hiatal hernia 2008  ° History of breast cancer   ° History of cerebrovascular accident 2009  ° Hyperlipidemia   ° Hypertension 2012  ° Macular degeneration   ° Polyp of colon 2008, 2014  ° Benign  ° PONV (postoperative nausea and vomiting)   ° Shingles 2008  ° Squamous cell carcinoma   ° 2010-2015  ° Squamous cell skin cancer, multiple sites 2014  ° recurrent  ° Stroke (HCC) 09/2007  ° Slight memory problems residual  ° Tendonitis   ° rt hand  ° Vocal cord polyp   ° ° ° °Outpatient Medications Prior to Visit  °Medication Sig Dispense Refill  ° acetaminophen (TYLENOL) 325 MG tablet Take 650 mg by mouth as needed for mild pain.     ° anastrozole  (ARIMIDEX) 1 MG tablet TAKE 1 TABLET BY MOUTH  DAILY 90 tablet 3  ° atorvastatin (LIPITOR) 20 MG tablet TAKE 1 TABLET BY MOUTH  EVERY OTHER NIGHT. 45 tablet 3  ° cholecalciferol (VITAMIN D3) 25 MCG (1000 UT) tablet Take 1,000 Units by mouth daily.    ° clopidogrel (PLAVIX) 75 MG tablet TAKE 1 TABLET BY MOUTH  DAILY 90 tablet 3  ° famotidine (PEPCID) 20 MG tablet TAKE 1 TABLET BY MOUTH  TWICE DAILY 180 tablet 3  ° fluticasone (FLONASE) 50 MCG/ACT nasal spray Place 2 sprays into both nostrils daily. 9.9 mL 3  ° losartan (COZAAR) 25 MG tablet TAKE 1 TABLET BY MOUTH  DAILY 90 tablet 3  ° Multiple Vitamins-Minerals (ICAPS AREDS 2 PO) Take 1 capsule by mouth 2 (two) times daily at 10 AM and 5 PM. Morning and evening    ° VIACTIV 500-500-40 MG-UNT-MCG CHEW CHEW 1 TABLET BY MOUTH EVERY MORNING.  12  ° albuterol (VENTOLIN HFA) 108 (90 Base) MCG/ACT inhaler Inhale 2 puffs into the lungs every 6 (six) hours as needed for wheezing or shortness of breath. (Patient not taking: Reported on 02/25/2021) 18 g 1  ° amLODipine (NORVASC) 2.5 MG tablet Take 1 tablet (2.5 mg total) by mouth   mouth daily. This is in place of losartan for 30 days 30 tablet 0   No facility-administered medications prior to visit.    Allergies  Allergen Reactions   Benadryl [Diphenhydramine Hcl]     Hyper   Doxycycline Other (See Comments)    acid reflux   Simvastatin Other (See Comments)    REACTION: memory loss   Fosamax [Alendronate Sodium] Other (See Comments)    GERD   Lisinopril Other (See Comments)    Dry cough   Nsaids Other (See Comments)    GI Bleed    ROS Review of Systems    Objective:    Physical Exam Constitutional:      Appearance: Normal appearance. She is well-developed.  HENT:     Head: Normocephalic and atraumatic.  Neck:     Vascular: No carotid bruit.  Cardiovascular:     Rate and Rhythm: Normal rate and regular rhythm.     Heart sounds: Normal heart sounds.  Pulmonary:     Effort: Pulmonary effort is normal.      Breath sounds: Normal breath sounds.  Musculoskeletal:     Cervical back: No tenderness.  Skin:    General: Skin is warm and dry.  Neurological:     Mental Status: She is alert and oriented to person, place, and time.  Psychiatric:        Behavior: Behavior normal.    BP 135/64    Pulse 87    Resp 18    Ht 5' 7" (1.702 m)    Wt 169 lb (76.7 kg)    BMI 26.47 kg/m  Wt Readings from Last 3 Encounters:  02/25/21 169 lb (76.7 kg)  08/27/20 168 lb (76.2 kg)  08/25/20 167 lb 0.6 oz (75.8 kg)     There are no preventive care reminders to display for this patient.   There are no preventive care reminders to display for this patient.  Lab Results  Component Value Date   TSH 2.48 08/27/2020   Lab Results  Component Value Date   WBC 7.7 06/23/2020   HGB 11.8 (L) 06/23/2020   HCT 34.5 (L) 06/23/2020   MCV 99.1 06/23/2020   PLT 204 06/23/2020   Lab Results  Component Value Date   NA 137 02/25/2021   K 4.7 02/25/2021   CO2 31 02/25/2021   GLUCOSE 98 02/25/2021   BUN 13 02/25/2021   CREATININE 0.94 02/25/2021   BILITOT 0.5 02/25/2021   ALKPHOS 88 06/23/2020   AST 18 02/25/2021   ALT 12 02/25/2021   PROT 6.9 02/25/2021   ALBUMIN 3.5 06/23/2020   CALCIUM 9.9 02/25/2021   ANIONGAP 6 06/23/2020   EGFR 59 (L) 02/25/2021   GFR 71.90 09/07/2011   Lab Results  Component Value Date   CHOL 143 02/25/2021   Lab Results  Component Value Date   HDL 60 02/25/2021   Lab Results  Component Value Date   LDLCALC 65 02/25/2021   Lab Results  Component Value Date   TRIG 98 02/25/2021   Lab Results  Component Value Date   CHOLHDL 2.4 02/25/2021   No results found for: HGBA1C    Assessment & Plan:   Problem List Items Addressed This Visit       Cardiovascular and Mediastinum   Essential hypertension, benign - Primary    Well controlled. Continue current regimen. Follow up in  6 mo did encourage her to start exercising and walking more regularly.  She has been  fairly sedentary.  Relevant Orders   Ferritin (Completed)   B12 (Completed)   Lipid Panel w/reflex Direct LDL (Completed)   COMPLETE METABOLIC PANEL WITH GFR (Completed)   Aneurysm of infrarenal abdominal aorta (HCC)    She was worried about doing any strenuous exercise.  Recommended doing some exercise bands just for toning and strengthening and resistance training but nothing heavy.        Respiratory   Interstitial lung disease ([place])    Stable.  No recent flares or exacerbations.  Encouraged her to just check the inhaler and make sure its not expired so that she has it if she needs it.        Digestive   GERD    Controlled on her current regimen.        Other   Iron deficiency anemia, unspecified   Relevant Orders   Ferritin (Completed)   Other Visit Diagnoses     Low serum vitamin B12       Relevant Orders   B12 (Completed)   Grief           B12 and iron deficiency-we will recheck levels today.  Grief-still grieving for the loss of her husband but feels like overall she is getting a little better she is having some good days.  No orders of the defined types were placed in this encounter.   Follow-up: Return in about 6 months (around 08/25/2021) for Hypertension.    Beatrice Lecher, MD

## 2021-02-26 LAB — COMPLETE METABOLIC PANEL WITH GFR
AG Ratio: 1.6 (calc) (ref 1.0–2.5)
ALT: 12 U/L (ref 6–29)
AST: 18 U/L (ref 10–35)
Albumin: 4.2 g/dL (ref 3.6–5.1)
Alkaline phosphatase (APISO): 104 U/L (ref 37–153)
BUN: 13 mg/dL (ref 7–25)
CO2: 31 mmol/L (ref 20–32)
Calcium: 9.9 mg/dL (ref 8.6–10.4)
Chloride: 99 mmol/L (ref 98–110)
Creat: 0.94 mg/dL (ref 0.60–0.95)
Globulin: 2.7 g/dL (calc) (ref 1.9–3.7)
Glucose, Bld: 98 mg/dL (ref 65–99)
Potassium: 4.7 mmol/L (ref 3.5–5.3)
Sodium: 137 mmol/L (ref 135–146)
Total Bilirubin: 0.5 mg/dL (ref 0.2–1.2)
Total Protein: 6.9 g/dL (ref 6.1–8.1)
eGFR: 59 mL/min/{1.73_m2} — ABNORMAL LOW (ref >=60)

## 2021-02-26 LAB — LIPID PANEL W/REFLEX DIRECT LDL
Cholesterol: 143 mg/dL (ref <200)
HDL: 60 mg/dL (ref >=50)
LDL Cholesterol (Calc): 65 mg/dL (calc)
Non-HDL Cholesterol (Calc): 83 mg/dL (calc) (ref <130)
Total CHOL/HDL Ratio: 2.4 (calc) (ref <5.0)
Triglycerides: 98 mg/dL (ref <150)

## 2021-02-26 LAB — VITAMIN B12: Vitamin B-12: 430 pg/mL (ref 200–1100)

## 2021-02-26 LAB — FERRITIN: Ferritin: 48 ng/mL (ref 16–288)

## 2021-02-26 NOTE — Assessment & Plan Note (Addendum)
Stable.  No recent flares or exacerbations.  Encouraged her to just check the inhaler and make sure its not expired so that she has it if she needs it.

## 2021-02-26 NOTE — Assessment & Plan Note (Addendum)
Well controlled. Continue current regimen. Follow up in  6 mo did encourage her to start exercising and walking more regularly.  She has been fairly sedentary.

## 2021-02-26 NOTE — Assessment & Plan Note (Signed)
She was worried about doing any strenuous exercise.  Recommended doing some exercise bands just for toning and strengthening and resistance training but nothing heavy.

## 2021-02-26 NOTE — Assessment & Plan Note (Signed)
Controlled on her current regimen 

## 2021-02-26 NOTE — Progress Notes (Signed)
Hi Michelle [name], good to see you yesterday.  Your kidney function is stable.  Liver enzymes are normal.  Iron stores do look better than 6 months ago.  We will try to get that ferritin above 40 and it is there which is great.  If you are still taking extra iron then I would recommend continuing your supplement for at least 3 more months and then at that point you can come off.  Your B12 is normal.  Looks much better than 6 months ago so great work.  If you are still taking B12 you can decrease to every other day.  Cholesterol looks good.

## 2021-03-02 DIAGNOSIS — H353132 Nonexudative age-related macular degeneration, bilateral, intermediate dry stage: Secondary | ICD-10-CM | POA: Diagnosis not present

## 2021-03-17 DIAGNOSIS — C44729 Squamous cell carcinoma of skin of left lower limb, including hip: Secondary | ICD-10-CM | POA: Diagnosis not present

## 2021-03-19 ENCOUNTER — Encounter: Payer: Medicare Other | Admitting: Psychology

## 2021-03-21 ENCOUNTER — Other Ambulatory Visit: Payer: Self-pay | Admitting: Family Medicine

## 2021-03-26 ENCOUNTER — Encounter: Payer: Medicare Other | Admitting: Psychology

## 2021-03-31 DIAGNOSIS — C44311 Basal cell carcinoma of skin of nose: Secondary | ICD-10-CM | POA: Diagnosis not present

## 2021-04-01 ENCOUNTER — Ambulatory Visit: Payer: Medicare Other | Admitting: Physician Assistant

## 2021-04-01 DIAGNOSIS — H353132 Nonexudative age-related macular degeneration, bilateral, intermediate dry stage: Secondary | ICD-10-CM | POA: Diagnosis not present

## 2021-04-24 ENCOUNTER — Encounter: Payer: Self-pay | Admitting: Family Medicine

## 2021-04-26 NOTE — Telephone Encounter (Signed)
We can put in an [employer]. Not sure.  ?

## 2021-05-01 DIAGNOSIS — H353132 Nonexudative age-related macular degeneration, bilateral, intermediate dry stage: Secondary | ICD-10-CM | POA: Diagnosis not present

## 2021-05-14 ENCOUNTER — Ambulatory Visit (INDEPENDENT_AMBULATORY_CARE_PROVIDER_SITE_OTHER): Payer: Medicare Other | Admitting: Psychology

## 2021-05-14 ENCOUNTER — Ambulatory Visit: Payer: Medicare Other | Admitting: Psychology

## 2021-05-14 ENCOUNTER — Encounter: Payer: Self-pay | Admitting: Psychology

## 2021-05-14 DIAGNOSIS — Z8673 Personal history of transient ischemic attack (TIA), and cerebral infarction without residual deficits: Secondary | ICD-10-CM | POA: Diagnosis not present

## 2021-05-14 DIAGNOSIS — R4189 Other symptoms and signs involving cognitive functions and awareness: Secondary | ICD-10-CM

## 2021-05-14 DIAGNOSIS — F0670 Mild neurocognitive disorder due to known physiological condition without behavioral disturbance: Secondary | ICD-10-CM

## 2021-05-14 DIAGNOSIS — I6781 Acute cerebrovascular insufficiency: Secondary | ICD-10-CM | POA: Diagnosis not present

## 2021-05-14 DIAGNOSIS — I999 Unspecified disorder of circulatory system: Secondary | ICD-10-CM | POA: Diagnosis not present

## 2021-05-14 HISTORY — DX: Unspecified disorder of circulatory system: I99.9

## 2021-05-14 NOTE — Progress Notes (Signed)
? ?  Psychometrician Note ?  ?Cognitive testing was administered to [name] by Milana Kidney, B.S. (psychometrist) under the supervision of Dr. Christia Reading, Ph.D., licensed psychologist on 05/14/2021. Ms. Boyett did not appear overtly distressed by the testing session per behavioral observation or responses across self-report questionnaires. Rest breaks were offered.  ?  ?The battery of tests administered was selected by Dr. Christia Reading, Ph.D. with consideration to Ms. Lovett's current level of functioning, the nature of her symptoms, emotional and behavioral responses during interview, level of literacy, observed level of motivation/effort, and the nature of the referral question. This battery was communicated to the psychometrist. Communication between Dr. Christia Reading, Ph.D. and the psychometrist was ongoing throughout the evaluation and Dr. Christia Reading, Ph.D. was immediately accessible at all times. Dr. Christia Reading, Ph.D. provided supervision to the psychometrist on the date of this service to the extent necessary to assure the quality of all services provided.  ?  ?SHIRIN ECHEVERRY will return within approximately 1-2 weeks for an interactive feedback session with Dr. Melvyn Novas at which time her test performances, clinical impressions, and treatment recommendations will be reviewed in detail. Ms. Panek understands she can contact our office should she require our assistance before this time. ? ?A total of 130 minutes of billable time were spent face-to-face with Ms. Crall by the psychometrist. This includes both test administration and scoring time. Billing for these services is reflected in the clinical report generated by Dr. Christia Reading, Ph.D. ? ?This note reflects time spent with the psychometrician and does not include test scores or any clinical interpretations made by Dr. Melvyn Novas. The full report will follow in a separate note.  ?

## 2021-05-14 NOTE — Progress Notes (Signed)
? ?NEUROPSYCHOLOGICAL EVALUATION ?[place] ?[place] Department of Neurology ? ?Date of Evaluation: May 14, 2021 ? ?Reason for Referral:  ? ?KAMIKA GOODLOE is a 88 y.o. left-handed Caucasian female referred by  Sharene Butters, PA-C , to characterize her current cognitive functioning and assist with diagnostic clarity and treatment planning in the context of subjective cognitive decline and a remote history of a small left cerebellar infarct.  ? ?Assessment and Plan:  ? ?Clinical Impression(s): ?Ms. Bulls's pattern of performance is suggestive of weaknesses in expressive language (e.g., word finding and articulating herself quickly and clearly) and information retrieval. This was evidenced by primary deficits across verbal fluency and confrontation naming tasks, as well as delayed recall across memory tasks (with retention scores ranging from 0% to 56%). She did exhibit an isolated impairment across a line orientation task; however, this could have been influenced by her history of macular degeneration and other constructional tasks were appropriate. Performances were also appropriate relative to age-matched peers across processing speed, attention/concentration, cognitive flexibility, safety/judgment, receptive language, and encoding (i.e., learning) and recognition/consolidation aspects of memory. Regarding ADLs, Ms. Frese has been advised to not drive by a prior physician but reported managing medications and finances with minimal difficulty. As such, given evidence for cognitive weakness described above, I believe she meets criteria for a Mild Neurocognitive Disorder ("mild cognitive impairment") at the present time. ? ?Regarding etiology, the most likely culprit at the present time appears to be vascular in nature (i.e., mild vascular neurocognitive disorder). Past brain scans have revealed severe and certainly age-advanced small vessel disease which has progressed over the years. It also  revealed a remote left PICA infarct. Vascular changes to the brain can certainly affect expressive language, as well as create inefficiencies in accessing previously learned information spontaneously. While left PICA infarcts often have greater physical ramifications than cognitive limitations, her pattern of weakness across testing would be reasonable when accompanied with extensive cerebrovascular changes. Overall, this presentation appears most likely.  ? ?Outside of a vascular cause, primary progressive aphasia (PPA) presentations reflect an underlying neurodegenerative illness which generally impacts expressive and receptive language first prior to spreading to other areas. Despite this, I do not find a PPA presentation likely at the present time. Ms. Keshishyan's age is quite advanced relative to when these presentations typically present and recent neuroimaging did not reveal advanced frontal or temporal lobe atrophy. Regarding Alzheimer's disease, despite trouble recalling a list of words at a delay, her performance on a yes/no recognition task was strong, suggesting intact storage with difficulty accessing this information upon demand. Performances were normatively appropriate across other aspects of memory testing, making the presence of this illness unlikely. She does not display cognitive or behavioral symptoms concerning for Lewy body dementia or other more rare parkinsonian syndromes at the present time. Continued medical monitoring will be important moving forward.  ? ?Recommendations: ?If desired, she could speak to Ms. Wertman surrounding medication for cognitive dysfunction. I am not certain that she would qualify given that remote memory scores are mostly adequate compared to age-matched peers. However, I defer that decision to Ms. Wertman. It is important to highlight that these medications have been shown to slow functional decline in some individuals. There is no current treatment which can stop or  reverse cognitive decline when caused by a neurodegenerative illness.  ? ?Limiting progressive vascular disease can be assisted by ensuring that medical ailments are managed appropriately, medications are taken accurately, and that she engage in good  health habits. Ms. Cocke is encouraged to attend to lifestyle factors for brain health (e.g., regular physical exercise, good nutrition habits [i.e., heart healthy diet], regular participation in cognitively-stimulating activities, and general stress management techniques), which are likely to have benefits for both emotional adjustment and cognition. Continued participation in activities which provide mental stimulation and social interaction is also recommended.  ? ?Performance across neurocognitive testing is not a strong predictor of an individual's safety operating a motor vehicle. Should her family wish to pursue a formalized driving evaluation and she is cleared from a vision perspective given macular degeneration and other eye conditions, they could reach out to the following agencies: ?[facility] [employer] in [place]: [phone] ?[facility]: [phone] ?[facility]: [phone] ?[facility]: [phone] or [phone] ? ?Should there be a progression of her current deficits over time, Ms. Cadenhead is unlikely to regain any independent living skills lost. Therefore, it is recommended that she remain as involved as possible in all aspects of household chores, finances, and medication management, with supervision to ensure adequate performance. She will likely benefit from the establishment and maintenance of a routine in order to maximize her functional abilities over time. ? ?It will be important for Ms. Sandquist to have another person with her when in situations where she may need to process information, weigh the pros and cons of different options, and make decisions, in order to ensure that she fully understands and  recalls all information to be considered. ? ?When learning new information, she would benefit from information being broken up into small, manageable pieces. She may also find it helpful to articulate the material in her own words and in a context to promote encoding at the onset of a new task. This material may need to be repeated multiple times to promote encoding. ? ?Memory can be improved using internal strategies such as rehearsal, repetition, chunking, mnemonics, association, and imagery. External strategies such as written notes in a consistently used memory journal, visual and nonverbal auditory cues such as a calendar on the refrigerator or appointments with alarm, such as on a cell phone, can also help maximize recall. ? ?While conversing, it will be beneficial for her daughters or other individuals to provide her cues to help with her recall should word finding difficulties emerge. This will be most effective if Ms. Gittens is able to understand current limitations in this cognitive domain and realize that this is not being performed to treat her like a child, but to help her effectively communicate and participate socially.  ? ?Review of Records:  ? ?Ms. Hreha was seen by [facility] Sharene Butters, PA-C) on 08/27/2020 for memory loss. Concerns surrounding memory were reported to be present since her 2009 left cerebellar infarct. Her daughter reported that she had been asking repetitive questions and telling the same stories for at least the past year. Cognitive changes were said to have been notably exacerbated following the passing of her husband in December 2021. ADLs were described as intact outside of driving. She reported recent driving activity but her "daughter took the keys of the car because she is afraid I am going to get into an accident." She denied REM behaviors, hallucinations, paranoia, movement difficulties, or prominent mood concerns currently. Performance on a brief cognitive  screening instrument (MOCA) was 24/30. Ultimately, Ms. Taflinger was referred for a comprehensive neuropsychological evaluation to characterize her cognitive abilities and to assist with diagnostic clarity and tr

## 2021-05-17 ENCOUNTER — Encounter: Payer: Self-pay | Admitting: Psychology

## 2021-05-21 ENCOUNTER — Ambulatory Visit (INDEPENDENT_AMBULATORY_CARE_PROVIDER_SITE_OTHER): Payer: Medicare Other | Admitting: Psychology

## 2021-05-21 DIAGNOSIS — I6781 Acute cerebrovascular insufficiency: Secondary | ICD-10-CM | POA: Diagnosis not present

## 2021-05-21 DIAGNOSIS — F0670 Mild neurocognitive disorder due to known physiological condition without behavioral disturbance: Secondary | ICD-10-CM

## 2021-05-21 DIAGNOSIS — Z8673 Personal history of transient ischemic attack (TIA), and cerebral infarction without residual deficits: Secondary | ICD-10-CM

## 2021-05-21 DIAGNOSIS — I999 Unspecified disorder of circulatory system: Secondary | ICD-10-CM | POA: Diagnosis not present

## 2021-05-21 NOTE — Progress Notes (Signed)
? ?  Neuropsychology Feedback Session ?[place]. [facility] ?[place] Department of Neurology ? ?Reason for Referral:  ? ?Michelle [name] is a 88 y.o. left-handed Caucasian female referred by  Sharene Butters, PA-C , to characterize her current cognitive functioning and assist with diagnostic clarity and treatment planning in the context of subjective cognitive decline and a remote history of a small left cerebellar infarct.  ? ?Feedback:  ? ?Michelle [name] completed a comprehensive neuropsychological evaluation on 05/14/2021. Please refer to that encounter for the full report and recommendations. Briefly, results suggested weaknesses in expressive language (e.g., word finding and articulating herself quickly and clearly) and information retrieval. This was evidenced by primary deficits across verbal fluency and confrontation naming tasks, as well as delayed recall across memory tasks (with retention scores ranging from 0% to 56%). She did exhibit an isolated impairment across a line orientation task; however, this could have been influenced by her history of macular degeneration and other constructional tasks were appropriate. Regarding etiology, the most likely culprit at the present time appears to be vascular in nature (i.e., mild vascular neurocognitive disorder). Past brain scans have revealed severe and certainly age-advanced small vessel disease which has progressed over the years. It also revealed a remote left PICA infarct. Vascular changes to the brain can certainly affect expressive language, as well as create inefficiencies in accessing previously learned information spontaneously. While left PICA infarcts often have greater physical ramifications than cognitive limitations, her pattern of weakness across testing would be reasonable when accompanied with extensive cerebrovascular changes. Overall, this presentation appears most likely.  ? ?Michelle [name] was accompanied by her daughters during the current  feedback session. Content of the current session focused on the results of her neuropsychological evaluation. Michelle [name] was given the opportunity to ask questions and her questions were answered. She was encouraged to reach out should additional questions arise. A copy of her report was provided at the conclusion of the visit.  ? ?  ? ?45 minutes were spent conducting the current feedback session with Michelle [name], billed as one unit [phone].  ?

## 2021-05-28 ENCOUNTER — Other Ambulatory Visit: Payer: Self-pay

## 2021-05-28 MED ORDER — ANASTROZOLE 1 MG PO TABS: 1.0000 mg | ORAL_TABLET | Freq: Every day | ORAL | 3 refills | Status: DC

## 2021-06-03 ENCOUNTER — Ambulatory Visit: Payer: Medicare Other | Admitting: Physician Assistant

## 2021-06-03 ENCOUNTER — Encounter: Payer: Self-pay | Admitting: Physician Assistant

## 2021-06-03 VITALS — BP 143/75 | HR 76 | Resp 18 | Ht 67.0 in | Wt 173.0 lb

## 2021-06-03 DIAGNOSIS — F0670 Mild neurocognitive disorder due to known physiological condition without behavioral disturbance: Secondary | ICD-10-CM

## 2021-06-03 DIAGNOSIS — I999 Unspecified disorder of circulatory system: Secondary | ICD-10-CM

## 2021-06-03 MED ORDER — MEMANTINE HCL 5 MG PO TABS: ORAL_TABLET | ORAL | 1 refills | Status: DC

## 2021-06-03 MED ORDER — DONEPEZIL HCL 5 MG PO TABS: 5.0000 mg | ORAL_TABLET | Freq: Every day | ORAL | 1 refills | Status: DC

## 2021-06-03 NOTE — Patient Instructions (Addendum)
It was a pleasure to see you today at our office.  ? ?Recommendations: ? ?Follow up in 6 months November 15 11:30  ?Start Memantine '5mg'$  tablets.  Take 1 tablet at bedtime for 2 weeks, then 1 tablet twice daily.   Side effects include dizziness, headache, diarrhea or constipation.  ?Please feel free to call Arnell Asal, Social Worker at [employer] at [phone] for guidance Also, you can call [facility] [phone] for guidance  ?[facility]  ?[place], [place] [address] ?[phone]  Hours of Operation Mondays to Thursdays: 8 am to 8 pm,Fridays: 9 am to 8 pm, Saturdays: 9 am to 1 pm ?Sundays: Closed ?  https://[email or web address].gov/departments/parks-recreation/active-adults-50/[facility]  ? ?Feel free to visit Facebook page " [facility]" for tips of how to care for people with memory problems.  ? ?RECOMMENDATIONS FOR ALL PATIENTS WITH MEMORY PROBLEMS: ? ?1. Continue to exercise (Recommend 30 minutes of walking everyday, or 3 hours every week) ?2. Increase social interactions - continue going to [place] and enjoy social gatherings with friends and family ?3. Eat healthy, avoid fried foods and eat more fruits and vegetables ?4. Maintain adequate blood pressure, blood sugar, and blood cholesterol level. Reducing the risk of stroke and cardiovascular disease also helps promoting better memory. ?5. Avoid stressful situations. Live a simple life and avoid aggravations. Organize your time and prepare for the next day in anticipation. ?6. Sleep well, avoid any interruptions of sleep and avoid any distractions in the bedroom that may interfere with adequate sleep quality ?7. Avoid sugar, avoid sweets as there is a strong link between excessive sugar intake, diabetes, and cognitive impairment ?We discussed the Mediterranean diet, which has been shown to help patients reduce the risk of progressive memory disorders and reduces cardiovascular risk. This  includes eating fish, eat fruits and green leafy vegetables, nuts like almonds and hazelnuts, walnuts, and also use olive oil. Avoid fast foods and fried foods as much as possible. Avoid sweets and sugar as sugar use has been linked to worsening of memory function. ? ?There is always a concern of gradual progression of memory problems. If this is the case, then we may need to adjust level of care according to patient needs. Support, both to the patient and caregiver, should then be put into place.  ? ? ?FALL PRECAUTIONS: Be cautious when walking. Scan the area for obstacles that may increase the risk of trips and falls. When getting up in the mornings, sit up at the edge of the bed for a few minutes before getting out of bed. Consider elevating the bed at the head end to avoid drop of blood pressure when getting up. Walk always in a well-lit room (use night lights in the walls). Avoid area rugs or power cords from appliances in the middle of the walkways. Use a walker or a cane if necessary and consider physical therapy for balance exercise. Get your eyesight checked regularly. ? ?FINANCIAL OVERSIGHT: Supervision, especially oversight when making financial decisions or transactions is also recommended. ? ?HOME SAFETY: Consider the safety of the kitchen when operating appliances like stoves, microwave oven, and blender. Consider having supervision and share cooking responsibilities until no longer able to participate in those. Accidents with firearms and other hazards in the house should be identified and addressed as well. ? ? ?ABILITY TO BE LEFT ALONE: If patient is unable to contact 911 operator, consider using LifeLine, or when the need is there, arrange for someone to stay with patients.  Smoking is a fire hazard, consider supervision or cessation. Risk of wandering should be assessed by caregiver and if detected at any point, supervision and safe proof recommendations should be instituted. ? ?MEDICATION  SUPERVISION: Inability to self-administer medication needs to be constantly addressed. Implement a mechanism to ensure safe administration of the medications. ? ? ?DRIVING: Regarding driving, in patients with progressive memory problems, driving will be impaired. We advise to have someone else do the driving if trouble finding directions or if minor accidents are reported. Independent driving assessment is available to determine safety of driving. ? ? ?If you are interested in the driving assessment, you can contact the following: ? ?[facility] [employer] in [place] ? ?[place] [phone] ? ?[facility] [phone] ? ?[facility] [phone] or [phone] ? ? ? ?Mediterranean Diet ?A Mediterranean diet refers to food and lifestyle choices that are based on the traditions of countries located on the [employer]. This way of eating has been shown to help prevent certain conditions and improve outcomes for people who have chronic diseases, like kidney disease and heart disease. ?What are tips for following this plan? ?Lifestyle  ?Cook and eat meals together with your family, when possible. ?Drink enough fluid to keep your urine clear or pale yellow. ?Be physically active every day. This includes: ?Aerobic exercise like running or swimming. ?Leisure activities like gardening, walking, or housework. ?Get 7-8 hours of sleep each night. ?If recommended by your health care provider, drink red wine in moderation. This means 1 glass a day for nonpregnant women and 2 glasses a day for men. A glass of wine equals 5 oz (150 mL). ?Reading food labels  ?Check the serving size of packaged foods. For foods such as rice and pasta, the serving size refers to the amount of cooked product, not dry. ?Check the total fat in packaged foods. Avoid foods that have saturated fat or trans fats. ?Check the ingredients list for added sugars, such as corn syrup. ?Shopping  ?At  the grocery store, buy most of your food from the areas near the walls of the store. This includes: ?Fresh fruits and vegetables (produce). ?Grains, beans, nuts, and seeds. Some of these may be available in unpackaged forms or large amounts (in bulk). ?Fresh seafood. ?Poultry and eggs. ?Low-fat dairy products. ?Buy whole ingredients instead of prepackaged foods. ?Buy fresh fruits and vegetables in-season from local farmers markets. ?Buy frozen fruits and vegetables in resealable bags. ?If you do not have access to quality fresh seafood, buy precooked frozen shrimp or canned fish, such as tuna, salmon, or sardines. ?Buy small amounts of raw or cooked vegetables, salads, or olives from the deli or salad bar at your store. ?Stock your pantry so you always have certain foods on hand, such as olive oil, canned tuna, canned tomatoes, rice, pasta, and beans. ?Cooking  ?Cook foods with extra-virgin olive oil instead of using butter or other vegetable oils. ?Have meat as a side dish, and have vegetables or grains as your main dish. This means having meat in small portions or adding small amounts of meat to foods like pasta or stew. ?Use beans or vegetables instead of meat in common dishes like chili or lasagna. ?Experiment with different cooking methods. Try roasting or broiling vegetables instead of steaming or saut?eing them. ?Add frozen vegetables to soups, stews, pasta, or rice. ?Add nuts or seeds for added healthy fat at each meal. You can add these to yogurt, salads, or vegetable dishes. ?Marinate fish  or vegetables using olive oil, lemon juice, garlic, and fresh herbs. ?Meal planning  ?Plan to eat 1 vegetarian meal one day each week. Try to work up to 2 vegetarian meals, if possible. ?Eat seafood 2 or more times a week. ?Have healthy snacks readily available, such as: ?Vegetable sticks with hummus. ?[place] yogurt. ?Fruit and nut trail mix. ?Eat balanced meals throughout the week. This includes: ?Fruit: 2-3 servings a  day ?Vegetables: 4-5 servings a day ?Low-fat dairy: 2 servings a day ?Fish, poultry, or lean meat: 1 serving a day ?Beans and legumes: 2 or more servings a week ?Nuts and seeds: 1-2 servings a day ?Whole grains: 6

## 2021-06-03 NOTE — Progress Notes (Signed)
? ?Assessment/Plan:  ? ?Mild Vascular Neurocognitive Disorder ? ?88 y.o. year old female with risk factors including  age, hypertension, hyperlipidemia, AAA, osteoporosis, h/o CVA 2009 with residual memory loss, R breast Cancer on anastrozole and h/o Endometrial Ca,history bradycardia, Iron deficiency Anemia, seen today in follow up for MCI likely of vascular etiology by neurocognitive testing on 05/2021. Last MoCA in July 2022 was 24/30. MRI brain 05/2020 was remarkable for moderate to extensive chronic small vessel ischemia with notable progression since 2009 was noted. Patient is not on antidementia meds. Multiple questions about the diagnosis, treatment from patient and family have been answered. ? ? Recommendations:  ? ?Discussed safety both in and out of the home.  ?Discussed the importance of regular daily schedule with inclusion of crossword puzzles to maintain brain function.  ?Information about [facility] and [facility] given ?Continue to monitor mood by PCP ?Stay active at least 30 minutes at least 3 times a week.  ?Naps should be scheduled and should be no longer than 60 minutes and should not occur after 2 PM.  ?Mediterranean diet is recommended  ?Control cardiovascular risk factors is highly recommended. THis has been discussed in detail   ?Start Memantine 5 mg: Take 1 tablet (5 mg at night) for 2 weeks, then increase to 1 tablet (5 mg) twice a day   Side effects were discussed ?Follow up in  6 months. ? ? ?Case discussed with Dr. Delice Lesch who agrees with the plan ? ? ? ? ?Subjective:  ? ? ?Michelle [name] is a very pleasant 88 y.o. RH female  seen today in follow up for memory loss. This patient is accompanied in the office by her  daughter [name] and her daughter [name] on the phone who supplement the history.  Previous records as well as any outside records available were reviewed prior to todays visit.  Patient was last seen at our office on  08/23/20 at which time her MoCA was 24/30 . She is not  on antidementia meds. ? ?Any changes in memory since last visit? Her daughters have to reminder about appointment times, and have to write in on a calendar.  People names are forgotten "a little more often".  She needs a "key word to recall names, places and things". Sometimes she confuses ages, for example she says that her daughter is 75 yrs old-she is 25.   " My memory problems are going to reverse with praying and going to Church"-she says. ?Patient lives with: daughter [name] ?repeats oneself? Not often ?Disoriented when walking into a room?  Patient denies   ?Leaving objects in unusual places?  Patient denies   ?Ambulates  with difficulty?   Patient denies   ?Recent falls?  Patient denies   ?Any head injuries?  Patient denies   ?History of seizures?   Patient denies   ?Wandering behavior?  Patient denies   ?Patient drives?   Patient no longer drives   ?Any mood changes such irritability agitation?  Patient denies but [name] reports that the patient watches the news all day and has become negative "because of that" ?Any history of depression?:  Patient denies   ?Hallucinations?  Patient denies   ?Paranoia?  Patient denies   ?Patient reports that he sleeps well without vivid dreams, REM behavior or sleepwalking    ?History of sleep apnea?  Patient denies   ?Any hygiene concerns?  Patient denies   ?Independent of bathing and dressing?  Endorsed  ?Does the patient needs help  with medications? Daughter [name] supervises.    ?Who is in charge of the finances? Daughter is in charge  ?Any changes in appetite?  Patient denies   ?Patient have trouble swallowing? Patient denies   ?Does the patient cook?  Patient denies   ?Any kitchen accidents such as leaving the stove on? Patient denies   ?Any headaches?  Patient denies   ?The double vision? Patient denies . She has macular degeneration  ?Any focal numbness or tingling?  Patient denies   ?Chronic back pain Patient denies   ?Unilateral weakness?  Patient denies   ?Any  tremors?  Patient denies   ?Any history of anosmia?  Patient denies   ?Any incontinence of urine?  Patient denies   ?Any bowel dysfunction?   Patient denies   ? ?Initial Visit 08/27/20 The patient is seen in neurologic consultation at the request of Hali Marry, * for the evaluation of memory.  The patient is accompanied by daughter [name] who supplements the history.  Her other daughter [name] is on the phone with [place].  The patient is an 88 y.o. year old female who has had memory issues since her stroke in 2009.  However, the symptoms became more noticeable after a day sudden death of her husband to massive heart attack in December 2021.  This caused significant emotional stress on the patient and she reports "now I am fine mainly coming out of it, but it was a rough 6 or 7 months ".  At that time, she could not sleep more than 3 hours a night, and was losing the confidence of being on my own, increasingly anxious, and having less patience ".  She moved a couple of months ago to be with her daughter, and now she is beginning to increase her activity, and going out of the house more often, although she feels that the memory loss "is still there ". She has been seen by her PCP, who suspects mild cognitive impairment.  She is in no medications at this time.  She has never been seen by a neurologist until now. ?Her daughter reports that she repeats the same questions, also tells the same stories for at least a year. Denies hallucinations or paranoia.  She denies acting out in her dreams, or sleepwalking.  Denies issues with bathing or dressing.  Denies missing any medications.  She does her own finances and denies missing any meals.  She denies leaving objects in unusual places.  Her appetite is good, denies trouble swallowing.  She cooks, and denies leaving the stove or the faucet on.  She used to drive until recently, but "the daughter took the keys of the car because she is afraid I am going to get into an  accident " [name] states that there was one episode where she did not recall how to get to the hair salon despite having known this place for a long time, in which the patient interjects "well I just moved to a different place and I am trying to learn my way around, and the roads changed ".  She ambulates without difficulty without a walker or a cane, and denies any recent falls, or trauma to the head. She denies any double vision, dizziness, focal numbness or tingling, unilateral weakness or tremors.  She denies anosmia.  Denies a history of sleep apnea, alcohol.  She quit in 1998 the use of tobacco.  Family history remarkable for maternal grandfather with dementia. She has 12th grade education. ?  ?  Labs 06/23/2020 ?Vitamin D 25 hydroxy 66.  31, normal ?CMP normal ?CBC remarkable for hemoglobin 11.8, hematocrit 34.5 remarkable for mild anemia ?B12 430  ?TSH 2.42  ?  ?MRI of the brain with and without contrast 06/16/2020 for memory loss ?1. No reversible finding or evidence of metastatic disease. ?2. Moderate to extensive chronic small vessel ischemia with notable progression from 2009. ?3. Mild cerebral volume loss since prior without specific pattern. ?  ? ?Neurocognitive Testing, Dr. Melvyn Novas 05/2021 Briefly, results suggested weaknesses in expressive language (e.g., word finding and articulating herself quickly and clearly) and information retrieval. This was evidenced by primary deficits across verbal fluency and confrontation naming tasks, as well as delayed recall across memory tasks (with retention scores ranging from 0% to 56%). She did exhibit an isolated impairment across a line orientation task; however, this could have been influenced by her history of macular degeneration and other constructional tasks were appropriate. Regarding etiology, the most likely culprit at the present time appears to be vascular in nature (i.e., mild vascular neurocognitive disorder). Past brain scans have revealed severe and  certainly age-advanced small vessel disease which has progressed over the years. It also revealed a remote left PICA infarct. Vascular changes to the brain can certainly affect expressive language, as well as create in

## 2021-06-14 DIAGNOSIS — H40013 Open angle with borderline findings, low risk, bilateral: Secondary | ICD-10-CM | POA: Diagnosis not present

## 2021-06-14 DIAGNOSIS — H353132 Nonexudative age-related macular degeneration, bilateral, intermediate dry stage: Secondary | ICD-10-CM | POA: Diagnosis not present

## 2021-06-18 DIAGNOSIS — Z1231 Encounter for screening mammogram for malignant neoplasm of breast: Secondary | ICD-10-CM | POA: Diagnosis not present

## 2021-06-18 LAB — HM MAMMOGRAPHY

## 2021-07-14 DIAGNOSIS — L821 Other seborrheic keratosis: Secondary | ICD-10-CM | POA: Diagnosis not present

## 2021-07-14 DIAGNOSIS — C44729 Squamous cell carcinoma of skin of left lower limb, including hip: Secondary | ICD-10-CM | POA: Diagnosis not present

## 2021-07-14 DIAGNOSIS — I788 Other diseases of capillaries: Secondary | ICD-10-CM | POA: Diagnosis not present

## 2021-07-14 DIAGNOSIS — L570 Actinic keratosis: Secondary | ICD-10-CM | POA: Diagnosis not present

## 2021-07-14 DIAGNOSIS — Z85828 Personal history of other malignant neoplasm of skin: Secondary | ICD-10-CM | POA: Diagnosis not present

## 2021-07-20 ENCOUNTER — Other Ambulatory Visit: Payer: Self-pay | Admitting: *Deleted

## 2021-07-20 ENCOUNTER — Inpatient Hospital Stay: Payer: Medicare Other | Admitting: Hematology and Oncology

## 2021-07-20 ENCOUNTER — Other Ambulatory Visit: Payer: Self-pay

## 2021-07-20 ENCOUNTER — Inpatient Hospital Stay: Payer: Medicare Other | Attending: Hematology and Oncology

## 2021-07-20 ENCOUNTER — Encounter: Payer: Self-pay | Admitting: Hematology and Oncology

## 2021-07-20 VITALS — BP 153/74 | HR 57 | Temp 97.5°F | Resp 16 | Ht 67.0 in | Wt 175.8 lb

## 2021-07-20 DIAGNOSIS — Z79899 Other long term (current) drug therapy: Secondary | ICD-10-CM | POA: Diagnosis not present

## 2021-07-20 DIAGNOSIS — Z170 Estrogen receptor positive status [ER+]: Secondary | ICD-10-CM | POA: Diagnosis not present

## 2021-07-20 DIAGNOSIS — Z8542 Personal history of malignant neoplasm of other parts of uterus: Secondary | ICD-10-CM

## 2021-07-20 DIAGNOSIS — C44320 Squamous cell carcinoma of skin of unspecified parts of face: Secondary | ICD-10-CM

## 2021-07-20 DIAGNOSIS — Z853 Personal history of malignant neoplasm of breast: Secondary | ICD-10-CM

## 2021-07-20 DIAGNOSIS — Z79811 Long term (current) use of aromatase inhibitors: Secondary | ICD-10-CM | POA: Diagnosis not present

## 2021-07-20 DIAGNOSIS — Z809 Family history of malignant neoplasm, unspecified: Secondary | ICD-10-CM

## 2021-07-20 DIAGNOSIS — C50411 Malignant neoplasm of upper-outer quadrant of right female breast: Secondary | ICD-10-CM | POA: Insufficient documentation

## 2021-07-20 DIAGNOSIS — Z90710 Acquired absence of both cervix and uterus: Secondary | ICD-10-CM | POA: Insufficient documentation

## 2021-07-20 LAB — CMP (CANCER CENTER ONLY)
ALT: 8 U/L (ref 0–44)
AST: 16 U/L (ref 15–41)
Albumin: 3.8 g/dL (ref 3.5–5.0)
Alkaline Phosphatase: 98 U/L (ref 38–126)
Anion gap: 2 — ABNORMAL LOW (ref 5–15)
BUN: 12 mg/dL (ref 8–23)
CO2: 32 mmol/L (ref 22–32)
Calcium: 10 mg/dL (ref 8.9–10.3)
Chloride: 104 mmol/L (ref 98–111)
Creatinine: 1 mg/dL (ref 0.44–1.00)
GFR, Estimated: 55 mL/min — ABNORMAL LOW (ref >60)
Glucose, Bld: 99 mg/dL (ref 70–99)
Potassium: 4.3 mmol/L (ref 3.5–5.1)
Sodium: 138 mmol/L (ref 135–145)
Total Bilirubin: 0.4 mg/dL (ref 0.3–1.2)
Total Protein: 6.9 g/dL (ref 6.5–8.1)

## 2021-07-20 LAB — CBC WITH DIFFERENTIAL (CANCER CENTER ONLY)
Abs Immature Granulocytes: 0.02 10*3/uL (ref 0.00–0.07)
Basophils Absolute: 0.1 10*3/uL (ref 0.0–0.1)
Basophils Relative: 1 %
Eosinophils Absolute: 0 10*3/uL (ref 0.0–0.5)
Eosinophils Relative: 0 %
HCT: 37.2 % (ref 36.0–46.0)
Hemoglobin: 12.3 g/dL (ref 12.0–15.0)
Immature Granulocytes: 0 %
Lymphocytes Relative: 18 %
Lymphs Abs: 1.4 10*3/uL (ref 0.7–4.0)
MCH: 33 pg (ref 26.0–34.0)
MCHC: 33.1 g/dL (ref 30.0–36.0)
MCV: 99.7 fL (ref 80.0–100.0)
Monocytes Absolute: 0.7 10*3/uL (ref 0.1–1.0)
Monocytes Relative: 9 %
Neutro Abs: 5.3 10*3/uL (ref 1.7–7.7)
Neutrophils Relative %: 72 %
Platelet Count: 220 10*3/uL (ref 150–400)
RBC: 3.73 MIL/uL — ABNORMAL LOW (ref 3.87–5.11)
RDW: 12.1 % (ref 11.5–15.5)
WBC Count: 7.5 10*3/uL (ref 4.0–10.5)
nRBC: 0 % (ref 0.0–0.2)

## 2021-07-20 LAB — GENETIC SCREENING ORDER

## 2021-07-20 MED ORDER — ANASTROZOLE 1 MG PO TABS: 1.0000 mg | ORAL_TABLET | Freq: Every day | ORAL | 3 refills | Status: DC

## 2021-07-20 NOTE — Progress Notes (Signed)
Michelle [name]  [phone] [phone]     ID: Michelle [name] DOB: 16-Mar-1932  MR#: [id number]  FXT#:[id number]  Patient Care Team: Michelle Marry, MD as PCP - General (Family Medicine) Michelle Bookbinder, MD as Attending Physician (Dermatology) Michelle Fam, MD as Attending Physician (Ophthalmology) Magrinat, Michelle Dad, MD (Inactive) as Consulting Physician (Oncology) Michelle Bookbinder, MD as Consulting Physician (General Surgery) Michelle Rudd, MD as Consulting Physician (Radiation Oncology) Michelle Lobo, MD as Consulting Physician (Gastroenterology) Michelle Pike, MD OTHER MD:  CHIEF COMPLAINT: Recurrent estrogen receptor positive breast cancer  CURRENT TREATMENT: anastrozole   INTERVAL HISTORY: Michelle [name] returns today for follow up of her recurrent breast cancer.   She is accompanied by her daughter today to the appointment.  She is doing quite well except for some mild cognitive impairment and she started memantine.  She is tolerating anastrozole very well.  She denies any new breast changes.  No change in breathing or bowel habits or urinary habits.  No new neurological complaints. She had mammogram in May 2023 which was unremarkable, no evidence of malignancy   COVID 19 VACCINATION STATUS: fully vaccinated Levan Hurst), with booster 12/2019, also had COVID in March 2020   HISTORY OF CURRENT ILLNESS: From the original intake note:  Michelle [name] has a history of right breast invasive ductal carcinoma status-post right lumpectomy with sentinel lymph node biopsy 03/14/2011 ([id number]), for a 0.52 cm, grade 1 invasive ductal carcinoma, both sentinel lymph nodes clear; margins negative; estrogen receptor 100% positive, progesterone receptor 100% positive, both with strong staining intensity, HER2 negative, with an MIB-1 of 10%. She underwent MammoSite therapy between 03/18/2011 and 03/25/2011.  Antiestrogen therapy was discussed with the patient by my  former partner Dr. Chancy [name] on 05/19/2011, with the patient declining.  Genetics testing for the BRCA 1 and 2 gene mutations was obtained 02/28/2011 and was negative.  She had routine screening mammography on 03/06/2018 showing a possible abnormality in the right breast. She proceeded to bilateral diagnostic mammography with tomography and right breast ultrasonography at [facility] on the same day showing: a 1.7 irregular, hypoechoic mass at the 11-12 o'clock areolar margin; one normal lymph node was seen.  Accordingly on 03/14/2018 she proceeded to biopsy of the right breast area in question. The pathology ([id number]) from this procedure showed: invasive ductal carcinoma, grade 2. Prognostic indicators significant for: estrogen receptor, 100% positive and progesterone receptor, 100% positive, both with strong staining intensity. Proliferation marker Ki67 at 10%. HER2 negative (1+) by immunohistochemistry.  Of note, she also has a personal history of uterine cancer status post total abdominal hysterectomy and bilateral salpingo-oophorectomy 06/11/2013 for an invasive endometrioid carcinoma of the uterus, grade 1, confined within the inner half of the endometrium.  A total of 3 left pelvic and 10 right pelvic lymph nodes were all negative for a pT1a pN0 stage.   She has also had multiple abscesses for skin cancer (basal cell and squamous cell).  The patient's subsequent history is as detailed below.   PAST MEDICAL HISTORY: Past Medical History:  Diagnosis Date   Abdominal aortic aneurysm (AAA) 3.0 cm to 5.0 cm in diameter in female 11/18/2016   Due for repeat in 12/2019.   Allergic rhinitis 05/26/2020   Aneurysm of infrarenal abdominal aorta 11/19/2013   2.6 cm in 2006 on CT.  CT 10/15 show 3 cm lesion.  [place]  2018 - no change   Arthritis    hips, lower back   Basal cell carcinoma 2011  R back   Bell's palsy 1984   Bilateral cold feet 03/28/2019   Blood transfusion without reported diagnosis 07/2015    Bradycardia    Cataract    Cholecystitis 2004   Chronic constipation 08/14/2018   Clotting disorder    Complication of anesthesia    COPD (chronic obstructive pulmonary disease)    Essential hypertension, benign 09/26/2007   GERD 11/03/2005   Hiatal hernia 2008   History of CVA (cerebrovascular accident) 09/24/2007   left cerebellar infarction, PICA territory   History of endometrial cancer 04/30/2013   uterine   History of GI bleed 07/04/2015   Hyperlipidemia 11/03/2005   LDL goal <100   Interstitial lung disease 03/28/2019   Iron deficiency anemia, unspecified    Macular degeneration    Malignant neoplasm of upper-outer quadrant of right breast in female, estrogen receptor positive 02/21/2011   Mild vascular neurocognitive disorder 05/14/2021   Osteoporosis 11/19/2014   DEXA 10/27/14.  Repeat in 2 yr.  High risk to start medication.    Polyp of colon 2008, 2014   Benign   PONV (postoperative nausea and vomiting)    Primary osteoarthritis of right knee 06/22/2015   Shingles 2008   Squamous cell skin cancer, face 03/22/2012   Actively seeing dermatology.   Tendonitis    rt hand   Venous stasis of both lower extremities 08/14/2018   Vocal cord polyp     PAST SURGICAL HISTORY: Past Surgical History:  Procedure Laterality Date   ABDOMINAL HYSTERECTOMY  2005   APPENDECTOMY     BASAL CELL CARCINOMA EXCISION     biospy  10/28/2009   Shave Biospy skin Left hand(Acitinic Keratoses), Right Upper Arm (superficial Basal Cell), Right Upper Back(Superficial Basal Cell), Left Neck ( Solar Lentigo & Seborrheic Keratoses)   BREAST BIOPSY  02/11/2011   Right Breast Needle Core Biospy - Upper Outer Quadrant; ER/PR 100%, Her-2 Neu neg.; Ki-67 10%   BREAST LUMPECTOMY WITH RADIOACTIVE SEED LOCALIZATION Right 06/12/2018   Procedure: RIGHT BREAST LUMPECTOMY WITH RADIOACTIVE SEED LOCALIZATION;  Surgeon: Michelle Bookbinder, MD;  Location: [facility];  Service: General;   Laterality: Right;   BREAST MAMMOSITE  03/14/2011   Procedure: MAMMOSITE BREAST;  Surgeon: Michelle Bookbinder, MD;  Location: [facility];  Service: General;  Laterality: Right;   BREAST SURGERY     right breast lumpectomy snbx   CHOLECYSTECTOMY  2004   COLONOSCOPY     COLONOSCOPY W/ POLYPECTOMY     2008, 2014 (benign)   COLONOSCOPY WITH PROPOFOL N/A 07/09/2015   Procedure: COLONOSCOPY WITH PROPOFOL;  Surgeon: Wonda Horner, MD;  Location: [facility] ENDOSCOPY;  Service: Endoscopy;  Laterality: N/A;   DILATION AND CURETTAGE OF UTERUS N/A 04/24/2013   Procedure: DILATATION AND CURETTAGE;  Surgeon: Guss Bunde, MD;  Location: [facility];  Service: Gynecology;  Laterality: N/A;   ESOPHAGOGASTRODUODENOSCOPY N/A 07/06/2015   Procedure: ESOPHAGOGASTRODUODENOSCOPY (EGD);  Surgeon: Wilford Corner, MD;  Location: [facility] ENDOSCOPY;  Service: Endoscopy;  Laterality: N/A;   HYSTEROSCOPY N/A 04/24/2013   Procedure: HYSTEROSCOPY;  Surgeon: Guss Bunde, MD;  Location: [facility];  Service: Gynecology;  Laterality: N/A;   ROBOTIC ASSISTED TOTAL HYSTERECTOMY WITH BILATERAL SALPINGO OOPHERECTOMY Bilateral 06/11/2013   Procedure: ROBOTIC ASSISTED TOTAL HYSTERECTOMY WITH BILATERAL SALPINGO OOPHORECTOMY WITH POSSIBLE STAGING, EPISIOTOMY;  Surgeon: Janie Morning, MD;  Location: [facility];  Service: Gynecology;  Laterality: Bilateral;   SQUAMOUS CELL CARCINOMA EXCISION     TEAR DUCT PROBING     TONSILLECTOMY  vocal cord poylps  1969   excision    FAMILY HISTORY Family History  Problem Relation Age of Onset   Heart disease Father    Cancer Sister        Lymphoma   Breast cancer Sister    Cancer Paternal Aunt        Breast cancer   Cancer Daughter        Bilateral Breast Cancer   Breast cancer Daughter 48   Cancer Daughter        Breast Cancer   Breast cancer Daughter    Cancer Sister        2005 - Renal Cell Cancer and Breast Cancer   Breast cancer Sister   Patient father was 85 years old  when he died from heart issues. Patient mother died from heart issues at age 67. Patient has 3 siblings, 1 brother and 2 sisters.  Patient notes a family hx of breast cancer in both sisters and both daughters. Patient's daughters have had extensive genetics evaluation with no deleterious mutation identified.    GYNECOLOGIC HISTORY:  No LMP recorded. Patient has had a hysterectomy. Menarche: 88 years old Age at first live birth: 88 years old Lavelle P 2 LMP 21, roughly 88 years old Contraceptive unknown HRT? No Hysterectomy? Yes, 06/11/2013 BSO? 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  Her husband Mortimer Fries was a Airline pilot.  He died 2020-01-30.  They have 2 daughters, [name] and [place]. The patient attends [employer]. She and her husband share 2 grandchildren and 4 great-grandchildren.    ADVANCED DIRECTIVES: Not in place   HEALTH MAINTENANCE: Social History   Tobacco Use   Smoking status: Former    Packs/day: 1.00    Years: 30.00    Total pack years: 30.00    Types: Cigarettes    Quit date: 02/01/1996    Years since quitting: 25.4   Smokeless tobacco: Never  Vaping Use   Vaping Use: Never used  Substance Use Topics   Alcohol use: Not Currently   Drug use: No     Colonoscopy: 07/09/2015, negative, Dr. Penelope Coop  PAP:  Bone density: Due May 2023   Allergies  Allergen Reactions   Benadryl [Diphenhydramine Hcl]     Hyper   Doxycycline Other (See Comments)    acid reflux   Simvastatin Other (See Comments)    REACTION: memory loss   Fosamax [Alendronate Sodium] Other (See Comments)    GERD   Lisinopril Other (See Comments)    Dry cough   Nsaids Other (See Comments)    GI Bleed    Current Outpatient Medications  Medication Sig Dispense Refill   acetaminophen (TYLENOL) 325 MG tablet Take 650 mg by mouth as needed for mild pain.      albuterol (VENTOLIN HFA) 108 (90 Base) MCG/ACT inhaler Inhale  2 puffs into the lungs every 6 (six) hours as needed for wheezing or shortness of breath. (Patient not taking: Reported on 02/25/2021) 18 g 1   anastrozole (ARIMIDEX) 1 MG tablet Take 1 tablet (1 mg total) by mouth daily. 90 tablet 3   atorvastatin (LIPITOR) 20 MG tablet TAKE 1 TABLET BY MOUTH  EVERY OTHER NIGHT 45 tablet 3   cholecalciferol (VITAMIN D3) 25 MCG (1000 UT) tablet Take 1,000 Units by mouth daily.     clopidogrel (PLAVIX) 75 MG tablet TAKE 1 TABLET BY MOUTH  DAILY 90 tablet 3   famotidine (PEPCID) 20 MG tablet TAKE 1 TABLET BY MOUTH  TWICE DAILY 180 tablet 3   fluticasone (FLONASE) 50 MCG/ACT nasal spray Place 2 sprays into both nostrils daily. 9.9 mL 3   losartan (COZAAR) 25 MG tablet TAKE 1 TABLET BY MOUTH  DAILY 90 tablet 3   memantine (NAMENDA) 5 MG tablet Take 1 tablet (5 mg at night) for 2 weeks, then increase to 1 tablet (5 mg) twice a day 60 tablet 1   Multiple Vitamins-Minerals (ICAPS AREDS 2 PO) Take 1 capsule by mouth 2 (two) times daily at 10 AM and 5 PM. Morning and evening     VIACTIV 500-500-40 MG-UNT-MCG CHEW CHEW 1 TABLET BY MOUTH EVERY MORNING.  12   No current facility-administered medications for this visit.    OBJECTIVE:  white woman who appears stated age  66:   07/20/21 1434  BP: (!) 153/74  Pulse: (!) 57  Resp: 16  Temp: (!) 97.5 F (36.4 C)  SpO2: 98%     Body mass index is 27.53 kg/m.   Wt Readings from Last 3 Encounters:  07/20/21 175 lb 12.8 oz (79.7 kg)  06/03/21 173 lb (78.5 kg)  02/25/21 169 lb (76.7 kg)      ECOG FS:1 - Symptomatic but completely ambulatory  Physical Exam Constitutional:      Appearance: Normal appearance.  Chest:     Comments: Bilateral breast examined.  No palpable masses or regional adenopathy.  She has postop seroma on the right side which is chronic since surgery Musculoskeletal:     Cervical back: Normal range of motion and neck supple. No rigidity.  Neurological:     Mental Status: She is alert.        LAB RESULTS:  CMP     Component Value Date/Time   NA 137 02/25/2021 0000   K 4.7 02/25/2021 0000   CL 99 02/25/2021 0000   CO2 31 02/25/2021 0000   GLUCOSE 98 02/25/2021 0000   BUN 13 02/25/2021 0000   BUN 12.3 11/01/2013 1031   CREATININE 0.94 02/25/2021 0000   CREATININE 0.9 11/01/2013 1031   CALCIUM 9.9 02/25/2021 0000   PROT 6.9 02/25/2021 0000   ALBUMIN 3.5 06/23/2020 1359   AST 18 02/25/2021 0000   AST 18 03/28/2018 1555   ALT 12 02/25/2021 0000   ALT 10 03/28/2018 1555   ALKPHOS 88 06/23/2020 1359   BILITOT 0.5 02/25/2021 0000   BILITOT 0.4 03/28/2018 1555   GFRNONAA >60 06/23/2020 1359   GFRNONAA 52 (L) 11/26/2019 1516   GFRAA 61 11/26/2019 1516    No results found for: "TOTALPROTELP", "ALBUMINELP", "A1GS", "A2GS", "BETS", "BETA2SER", "GAMS", "MSPIKE", "SPEI"  No results found for: "KPAFRELGTCHN", "LAMBDASER", "KAPLAMBRATIO"  Lab Results  Component Value Date   WBC 7.5 07/20/2021   NEUTROABS 5.3 07/20/2021   HGB 12.3 07/20/2021   HCT 37.2 07/20/2021   MCV 99.7 07/20/2021   PLT 220 07/20/2021   Lab Results  Component Value Date   LABCA2 77 (H) 03/08/2011    No components found for: "GMWNUU725"  No results for input(s): "INR" in the last 168 hours.  Lab Results  Component Value Date   LABCA2 77 (H) 03/08/2011    No results found for: "DGU440"  No results found for: "CAN125"  No results found for: "CAN153"  No results found for: "CA2729"  No components found for: "HGQUANT"  No results found for: "CEA1", "CEA" / No results found for: "CEA1", "  CEA"   No results found for: "AFPTUMOR"  No results found for: "[place]"  No results found for: "HGBA", "HGBA2QUANT", "HGBFQUANT", "HGBSQUAN" (Hemoglobinopathy evaluation)   Lab Results  Component Value Date   LDH 131 07/04/2015    Lab Results  Component Value Date   IRON 179 (H) 07/04/2015   TIBC 249 (L) 07/04/2015   IRONPCTSAT 72 (H) 07/04/2015   (Iron and TIBC)  Lab Results   Component Value Date   FERRITIN 48 02/25/2021    Urinalysis    Component Value Date/Time   COLORURINE YELLOW 10/21/2013 [place] 10/21/2013 1645   LABSPEC 1.013 10/21/2013 1645   PHURINE 5.5 10/21/2013 1645   GLUCOSEU NEG 10/21/2013 1645   HGBUR NEG 10/21/2013 1645   BILIRUBINUR NEG 10/21/2013 1645   KETONESUR NEG 10/21/2013 1645   PROTEINUR NEG 10/21/2013 1645   UROBILINOGEN 0.2 10/21/2013 1645   NITRITE NEG 10/21/2013 1645   LEUKOCYTESUR TRACE (A) 10/21/2013 1645    STUDIES: No results found.    ELIGIBLE FOR AVAILABLE RESEARCH PROTOCOL: no   ASSESSMENT: 88 y.o. [place] woman  (1) status post right upper outer quadrant lumpectomy 03/14/2011 for a pT1b (0.52 cm), pN0 invasive ductal carcinoma, grade 1, estrogen and progesterone receptor positive, HER-2 nonamplified, with an MIB-1 of 10%; a total of 2 axillary lymph nodes were removed.  (a) status post MammoSite radiation between 03/17/2018 and 03/24/2018  (b) opted against adjuvant antiestrogens  (2) status post total abdominal hysterectomy and bilateral salpingo-oophorectomy 06/11/2013 for a pT1a pN0 invasive endometrioid carcinoma, FIGO grade 1, a total of 3 left and 10 right pelvic lymph nodes all clear  (3) status post right breast upper outer quadrant biopsy 03/14/2018 for a clinical T1c N0 invasive ductal carcinoma, grade 2, strongly estrogen and progesterone receptor positive, HER-2 nonamplified, with an MIB-1 of 10%.  (4) status post right lumpectomy 06/12/2018 for a pT1c pNX invasive ductal carcinoma, grade 2, with the final resection margins negative   (5) anastrozole started 04/01/2018  (a) bone density 12/04/2018 shows a T score of -2.0  (6) opted against adjuvant radiation   PLAN: Earnest is now 3 years out from definitive surgery for her second or recurrent right-sided breast cancer.  She is tolerating anastrozole well and the plan is to continue that a total of 5 years. Mammogram May  2023 with no evidence of malignancy. I have also requested another bone density scan last, last bone density in November 2020, this will be faxed to [facility]. In the meantime she was encouraged to take calcium/vitamin D supplementation and consider weightbearing exercises  Michelle Pike, MD   07/20/2021 2:54 PM Medical Oncology and Hematology [facility] [place], [place] [address] Tel. [phone]    Fax. [phone]    *Total Encounter Time as defined by the Centers for Medicare and Medicaid Services includes, in addition to the face-to-face time of a patient visit (documented in the note above) non-face-to-face time: obtaining and reviewing outside history, ordering and reviewing medications, tests or procedures, care coordination (communications with other health care professionals or caregivers) and documentation in the medical record.

## 2021-07-28 DIAGNOSIS — L570 Actinic keratosis: Secondary | ICD-10-CM | POA: Diagnosis not present

## 2021-07-28 DIAGNOSIS — C44729 Squamous cell carcinoma of skin of left lower limb, including hip: Secondary | ICD-10-CM | POA: Diagnosis not present

## 2021-07-29 DIAGNOSIS — H353132 Nonexudative age-related macular degeneration, bilateral, intermediate dry stage: Secondary | ICD-10-CM | POA: Diagnosis not present

## 2021-07-29 LAB — HM DIABETES EYE EXAM

## 2021-08-06 ENCOUNTER — Other Ambulatory Visit: Payer: Self-pay | Admitting: Physician Assistant

## 2021-08-11 DIAGNOSIS — H353132 Nonexudative age-related macular degeneration, bilateral, intermediate dry stage: Secondary | ICD-10-CM | POA: Diagnosis not present

## 2021-08-17 ENCOUNTER — Encounter: Payer: Self-pay | Admitting: Hematology and Oncology

## 2021-08-17 DIAGNOSIS — M85852 Other specified disorders of bone density and structure, left thigh: Secondary | ICD-10-CM | POA: Diagnosis not present

## 2021-08-17 DIAGNOSIS — M85851 Other specified disorders of bone density and structure, right thigh: Secondary | ICD-10-CM | POA: Diagnosis not present

## 2021-08-17 DIAGNOSIS — M810 Age-related osteoporosis without current pathological fracture: Secondary | ICD-10-CM | POA: Diagnosis not present

## 2021-08-18 ENCOUNTER — Telehealth: Payer: Self-pay | Admitting: *Deleted

## 2021-08-23 ENCOUNTER — Other Ambulatory Visit: Payer: Self-pay | Admitting: Hematology and Oncology

## 2021-08-23 DIAGNOSIS — Z170 Estrogen receptor positive status [ER+]: Secondary | ICD-10-CM

## 2021-08-23 NOTE — Progress Notes (Signed)
Genetics referral placed per patient's request.  Michelle [name] Michelle [name]

## 2021-08-25 ENCOUNTER — Telehealth: Payer: Self-pay | Admitting: Hematology and Oncology

## 2021-08-25 ENCOUNTER — Telehealth: Payer: Self-pay | Admitting: Genetic Counselor

## 2021-08-25 DIAGNOSIS — Z803 Family history of malignant neoplasm of breast: Secondary | ICD-10-CM | POA: Diagnosis not present

## 2021-08-25 DIAGNOSIS — Z853 Personal history of malignant neoplasm of breast: Secondary | ICD-10-CM | POA: Diagnosis not present

## 2021-08-25 NOTE — Telephone Encounter (Signed)
.  Called pt per 7/24 inbasket , Patient was unavailable, a message with appt time and date was left with number on file.

## 2021-08-25 NOTE — Telephone Encounter (Signed)
[facility] CancerNext-Expanded Panel was ordered on 08/25/2021. The blood sample was collected on 07/20/2021.  Lucille Passy, MS, [facility] Genetic Counselor [email or web address] (P) [phone]

## 2021-08-26 ENCOUNTER — Ambulatory Visit (INDEPENDENT_AMBULATORY_CARE_PROVIDER_SITE_OTHER): Payer: Medicare Other | Admitting: Family Medicine

## 2021-08-26 ENCOUNTER — Encounter: Payer: Self-pay | Admitting: Family Medicine

## 2021-08-26 VITALS — BP 130/67 | HR 66 | Ht 67.0 in | Wt 172.0 lb

## 2021-08-26 DIAGNOSIS — I10 Essential (primary) hypertension: Secondary | ICD-10-CM | POA: Diagnosis not present

## 2021-08-26 DIAGNOSIS — E538 Deficiency of other specified B group vitamins: Secondary | ICD-10-CM | POA: Diagnosis not present

## 2021-08-26 DIAGNOSIS — I7143 Infrarenal abdominal aortic aneurysm, without rupture: Secondary | ICD-10-CM

## 2021-08-26 DIAGNOSIS — F0670 Mild neurocognitive disorder due to known physiological condition without behavioral disturbance: Secondary | ICD-10-CM | POA: Diagnosis not present

## 2021-08-26 DIAGNOSIS — I999 Unspecified disorder of circulatory system: Secondary | ICD-10-CM | POA: Diagnosis not present

## 2021-08-26 DIAGNOSIS — I878 Other specified disorders of veins: Secondary | ICD-10-CM | POA: Diagnosis not present

## 2021-08-26 DIAGNOSIS — T50905A Adverse effect of unspecified drugs, medicaments and biological substances, initial encounter: Secondary | ICD-10-CM

## 2021-08-26 DIAGNOSIS — D509 Iron deficiency anemia, unspecified: Secondary | ICD-10-CM | POA: Diagnosis not present

## 2021-08-26 HISTORY — DX: Deficiency of other specified B group vitamins: E53.8

## 2021-08-26 MED ORDER — MEMANTINE HCL 5 MG PO TABS: 5.0000 mg | ORAL_TABLET | Freq: Two times a day (BID) | ORAL | 1 refills | Status: DC

## 2021-08-26 NOTE — Assessment & Plan Note (Signed)
Stable on Namenda.  We will go ahead and refill prescription.  Plan to repeat MoCA testing in 6 months.

## 2021-08-26 NOTE — Progress Notes (Signed)
Acute Office Visit  Subjective:     Patient ID: Michelle [name], female    DOB: 09-29-1932, 88 y.o.   MRN: [id number]  Chief Complaint  Patient presents with   Follow-up    HPI Patient is in today for a couple of concerns.  She was recently started on antibiotics, amoxicillin 500 mg 3 times daily for a dental infection after having a root canal.  She read on the package insert that she is at risk for getting C. difficile.  She is never had this infection before but wanted to know if taking/eating activity a yogurt would be helpful.  Hypertension- Pt denies chest pain, SOB, dizziness, or heart palpitations.  Taking meds as directed w/o problems.  Denies medication side effects.    She still feels like she is grieving from the loss of her husband but feels like most days she does okay.  She not denies feeling particularly anxious.  Her daughter who is here with her today mentions that she has been snoring loud enough that they can hear it through the walls.  But the patient says she herself is not interested in being tested because she does not want to do any type of treatment or CPAP.  She still has some intermittent swelling in her ankles is always better in the morning and gets worse as the day progresses.  No recent chest pain or shortness of breath.  Its been more of a chronic issue it does get better when she elevates her feet.  She reports that she does have some history of lymphedema as well as she has had multiple lymph nodes removed.  She also has gotten a nosebleed from using Flonase.  She says most of the time she skips using it during the week and just uses it on the weekend because it does does help with her rhinorrhea.   ROS      Objective:    BP 130/67   Pulse 66   Ht '5\' 7"'$  (1.702 m)   Wt 172 lb (78 kg)   SpO2 96%   BMI 26.94 kg/m    Physical Exam  No results found for any visits on 08/26/21.      Assessment & Plan:   Problem List Items Addressed This  Visit       Cardiovascular and Mediastinum   Venous stasis of both lower extremities    Reassured her that the swelling in her lower extremities is most consistent with venous stasis.  Recommendation is for compression with compression stockings which she does wear often and elevation when needed.  If they are swelling more than usual becoming more painful then please let me know.      Essential hypertension, benign - Primary    Well controlled. Continue current regimen. Follow up in  6 mo       Relevant Medications   memantine (NAMENDA) 5 MG tablet   Other Relevant Orders   COMPLETE METABOLIC PANEL WITH GFR   CBC   TSH   B12   Ferritin   Aneurysm of infrarenal abdominal aorta (HCC)     Nervous and Auditory   Mild vascular neurocognitive disorder    Stable on Namenda.  We will go ahead and refill prescription.  Plan to repeat MoCA testing in 6 months.        Other   Low serum vitamin B12   Relevant Orders   B12   Iron deficiency anemia, unspecified    Recheck  iron.       Relevant Orders   Ferritin   Other Visit Diagnoses     Medication side effect, initial encounter           I do think she would benefit from probiotic yogurt or probiotic While Taking Antibiotics She Can Do Either 1 but Does Not Need to Do Both.  She Does Need to Space It from the Time That She Takes the Antibiotic by Few Hours.  This Should Help Prevent Infections Such As C. difficile and Then Also Just Being in General Careful about When to Take Antibiotics.  It Sounds like Currently They Are Treating an Active Infection so I Do Think It Is Important for Her to Take the Medication.  Medication side effect-discussed technique to avoid excessive application of the nasal spray to the septum which can help reduce nosebleeds.  If she does get a nosebleed then please stop the medication for 2 weeks and then if it is resolved okay to restart at that time.  But it is not uncommon with more chronic use to  cause nosebleeds.  Meds ordered this encounter  Medications   memantine (NAMENDA) 5 MG tablet    Sig: Take 1 tablet (5 mg total) by mouth 2 (two) times daily.    Dispense:  180 tablet    Refill:  1    Return in about 6 months (around 02/26/2022) for Hypertension and Memory testing.  40 min appt.  Beatrice Lecher, MD

## 2021-08-26 NOTE — Assessment & Plan Note (Signed)
Well controlled. Continue current regimen. Follow up in  6 mo  

## 2021-08-26 NOTE — Assessment & Plan Note (Signed)
Recheck iron.

## 2021-08-26 NOTE — Assessment & Plan Note (Signed)
Reassured her that the swelling in her lower extremities is most consistent with venous stasis.  Recommendation is for compression with compression stockings which she does wear often and elevation when needed.  If they are swelling more than usual becoming more painful then please let me know.

## 2021-08-27 LAB — COMPLETE METABOLIC PANEL WITH GFR
AG Ratio: 1.5 (calc) (ref 1.0–2.5)
ALT: 9 U/L (ref 6–29)
AST: 17 U/L (ref 10–35)
Albumin: 4.1 g/dL (ref 3.6–5.1)
Alkaline phosphatase (APISO): 105 U/L (ref 37–153)
BUN: 12 mg/dL (ref 7–25)
CO2: 31 mmol/L (ref 20–32)
Calcium: 9.9 mg/dL (ref 8.6–10.4)
Chloride: 102 mmol/L (ref 98–110)
Creat: 0.95 mg/dL (ref 0.60–0.95)
Globulin: 2.8 g/dL (calc) (ref 1.9–3.7)
Glucose, Bld: 105 mg/dL — ABNORMAL HIGH (ref 65–99)
Potassium: 4.3 mmol/L (ref 3.5–5.3)
Sodium: 138 mmol/L (ref 135–146)
Total Bilirubin: 0.5 mg/dL (ref 0.2–1.2)
Total Protein: 6.9 g/dL (ref 6.1–8.1)
eGFR: 58 mL/min/{1.73_m2} — ABNORMAL LOW (ref >=60)

## 2021-08-27 LAB — FERRITIN: Ferritin: 45 ng/mL (ref 16–288)

## 2021-08-27 LAB — CBC
HCT: 37 % (ref 35.0–45.0)
Hemoglobin: 13 g/dL (ref 11.7–15.5)
MCH: 34.2 pg — ABNORMAL HIGH (ref 27.0–33.0)
MCHC: 35.1 g/dL (ref 32.0–36.0)
MCV: 97.4 fL (ref 80.0–100.0)
MPV: 10.1 fL (ref 7.5–12.5)
Platelets: 229 10*3/uL (ref 140–400)
RBC: 3.8 10*6/uL (ref 3.80–5.10)
RDW: 11.3 % (ref 11.0–15.0)
WBC: 7.5 10*3/uL (ref 3.8–10.8)

## 2021-08-27 LAB — VITAMIN B12: Vitamin B-12: 392 pg/mL (ref 200–1100)

## 2021-08-27 LAB — TSH: TSH: 1.66 mIU/L (ref 0.40–4.50)

## 2021-08-27 NOTE — Progress Notes (Signed)
Hi Michelle [name], kidney function is stable.  Liver enzymes look great.  No sign of anemia.  Thyroid level looks perfect.  Iron and vitamin B12 are normal.

## 2021-09-02 NOTE — Telephone Encounter (Signed)
No entry 

## 2021-09-08 DIAGNOSIS — L720 Epidermal cyst: Secondary | ICD-10-CM | POA: Diagnosis not present

## 2021-09-08 DIAGNOSIS — L570 Actinic keratosis: Secondary | ICD-10-CM | POA: Diagnosis not present

## 2021-09-08 DIAGNOSIS — L821 Other seborrheic keratosis: Secondary | ICD-10-CM | POA: Diagnosis not present

## 2021-09-08 DIAGNOSIS — L814 Other melanin hyperpigmentation: Secondary | ICD-10-CM | POA: Diagnosis not present

## 2021-09-08 DIAGNOSIS — Z85828 Personal history of other malignant neoplasm of skin: Secondary | ICD-10-CM | POA: Diagnosis not present

## 2021-09-08 DIAGNOSIS — D1801 Hemangioma of skin and subcutaneous tissue: Secondary | ICD-10-CM | POA: Diagnosis not present

## 2021-09-16 ENCOUNTER — Telehealth: Payer: Self-pay | Admitting: Genetic Counselor

## 2021-09-16 ENCOUNTER — Encounter: Payer: Self-pay | Admitting: Genetic Counselor

## 2021-09-16 DIAGNOSIS — Z1379 Encounter for other screening for genetic and chromosomal anomalies: Secondary | ICD-10-CM | POA: Insufficient documentation

## 2021-09-16 HISTORY — DX: Encounter for other screening for genetic and chromosomal anomalies: Z13.79

## 2021-09-16 NOTE — Telephone Encounter (Signed)
Received message from Shelby from scheduling that patient declined rescheduling genetics appt to discuss results of genetic testing.  Called patient to disclose these negative results.  LVM requesting call back.

## 2021-09-16 NOTE — Telephone Encounter (Signed)
Called to r/s pts genetic counseling appt per 8/16 staff msg. Spoke to pt's daughters, [name] and [name], they declined to r/s at this time. I explained that appt was to go over pt's genetic testing results, but they still declined. Msg sent to Mel Almond Flippin to let her know.

## 2021-10-07 ENCOUNTER — Other Ambulatory Visit: Payer: Self-pay | Admitting: Family Medicine

## 2021-10-13 ENCOUNTER — Encounter: Payer: Medicare Other | Admitting: Genetic Counselor

## 2021-10-13 ENCOUNTER — Other Ambulatory Visit: Payer: Medicare Other

## 2021-10-17 ENCOUNTER — Encounter: Payer: Self-pay | Admitting: Family Medicine

## 2021-11-04 ENCOUNTER — Other Ambulatory Visit: Payer: Self-pay | Admitting: Family Medicine

## 2021-11-04 DIAGNOSIS — I10 Essential (primary) hypertension: Secondary | ICD-10-CM

## 2021-11-24 ENCOUNTER — Encounter: Payer: Medicare Other | Admitting: Psychology

## 2021-12-01 ENCOUNTER — Encounter: Payer: Medicare Other | Admitting: Psychology

## 2021-12-01 ENCOUNTER — Encounter (INDEPENDENT_AMBULATORY_CARE_PROVIDER_SITE_OTHER): Payer: Medicare Other | Admitting: Ophthalmology

## 2021-12-01 DIAGNOSIS — H43813 Vitreous degeneration, bilateral: Secondary | ICD-10-CM | POA: Diagnosis not present

## 2021-12-01 DIAGNOSIS — I10 Essential (primary) hypertension: Secondary | ICD-10-CM

## 2021-12-01 DIAGNOSIS — D3132 Benign neoplasm of left choroid: Secondary | ICD-10-CM

## 2021-12-01 DIAGNOSIS — H353132 Nonexudative age-related macular degeneration, bilateral, intermediate dry stage: Secondary | ICD-10-CM

## 2021-12-01 DIAGNOSIS — H35033 Hypertensive retinopathy, bilateral: Secondary | ICD-10-CM

## 2021-12-08 ENCOUNTER — Encounter: Payer: Self-pay | Admitting: Family Medicine

## 2021-12-08 ENCOUNTER — Ambulatory Visit: Payer: Medicare Other | Admitting: Physician Assistant

## 2021-12-09 NOTE — Telephone Encounter (Signed)
Added  flu and covid to immunization history.

## 2021-12-15 ENCOUNTER — Encounter: Payer: Self-pay | Admitting: Physician Assistant

## 2021-12-15 ENCOUNTER — Ambulatory Visit: Payer: Medicare Other | Admitting: Physician Assistant

## 2021-12-15 VITALS — BP 134/77 | HR 74 | Ht 67.0 in | Wt 174.8 lb

## 2021-12-15 DIAGNOSIS — I999 Unspecified disorder of circulatory system: Secondary | ICD-10-CM | POA: Diagnosis not present

## 2021-12-15 DIAGNOSIS — F0670 Mild neurocognitive disorder due to known physiological condition without behavioral disturbance: Secondary | ICD-10-CM | POA: Diagnosis not present

## 2021-12-15 NOTE — Patient Instructions (Signed)
It was a pleasure to see you today at our office.   Recommendations:  Follow up in 6 months May 29 at 11:30  Continue memantine 5 mg two times a day   Consider [facility] or [place] free to visit Facebook page " [employer]" for tips of how to care for people with memory problems.   RECOMMENDATIONS FOR ALL PATIENTS WITH MEMORY PROBLEMS:  1. Continue to exercise (Recommend 30 minutes of walking everyday, or 3 hours every week) 2. Increase social interactions - continue going to [place] and enjoy social gatherings with friends and family 3. Eat healthy, avoid fried foods and eat more fruits and vegetables 4. Maintain adequate blood pressure, blood sugar, and blood cholesterol level. Reducing the risk of stroke and cardiovascular disease also helps promoting better memory. 5. Avoid stressful situations. Live a simple life and avoid aggravations. Organize your time and prepare for the next day in anticipation. 6. Sleep well, avoid any interruptions of sleep and avoid any distractions in the bedroom that may interfere with adequate sleep quality 7. Avoid sugar, avoid sweets as there is a strong link between excessive sugar intake, diabetes, and cognitive impairment We discussed the Mediterranean diet, which has been shown to help patients reduce the risk of progressive memory disorders and reduces cardiovascular risk. This includes eating fish, eat fruits and green leafy vegetables, nuts like almonds and hazelnuts, walnuts, and also use olive oil. Avoid fast foods and fried foods as much as possible. Avoid sweets and sugar as sugar use has been linked to worsening of memory function.  There is always a concern of gradual progression of memory problems. If this is the case, then we may need to adjust level of care according to patient needs. Support, both to the patient and caregiver, should then be put into place.    FALL PRECAUTIONS: Be cautious when walking. Scan the area for  obstacles that may increase the risk of trips and falls. When getting up in the mornings, sit up at the edge of the bed for a few minutes before getting out of bed. Consider elevating the bed at the head end to avoid drop of blood pressure when getting up. Walk always in a well-lit room (use night lights in the walls). Avoid area rugs or power cords from appliances in the middle of the walkways. Use a walker or a cane if necessary and consider physical therapy for balance exercise. Get your eyesight checked regularly.  FINANCIAL OVERSIGHT: Supervision, especially oversight when making financial decisions or transactions is also recommended.  HOME SAFETY: Consider the safety of the kitchen when operating appliances like stoves, microwave oven, and blender. Consider having supervision and share cooking responsibilities until no longer able to participate in those. Accidents with firearms and other hazards in the house should be identified and addressed as well.   ABILITY TO BE LEFT ALONE: If patient is unable to contact 911 operator, consider using LifeLine, or when the need is there, arrange for someone to stay with patients. Smoking is a fire hazard, consider supervision or cessation. Risk of wandering should be assessed by caregiver and if detected at any point, supervision and safe proof recommendations should be instituted.  MEDICATION SUPERVISION: Inability to self-administer medication needs to be constantly addressed. Implement a mechanism to ensure safe administration of the medications.       Mediterranean Diet A Mediterranean diet refers to food and lifestyle choices that are based on the traditions of countries located  on the [place]. This way of eating has been shown to help prevent certain conditions and improve outcomes for people who have chronic diseases, like kidney disease and heart disease. What are tips for following this plan? Lifestyle  Cook and eat meals together  with your family, when possible. Drink enough fluid to keep your urine clear or pale yellow. Be physically active every day. This includes: Aerobic exercise like running or swimming. Leisure activities like gardening, walking, or housework. Get 7-8 hours of sleep each night. If recommended by your health care provider, drink red wine in moderation. This means 1 glass a day for nonpregnant women and 2 glasses a day for men. A glass of wine equals 5 oz (150 mL). Reading food labels  Check the serving size of packaged foods. For foods such as rice and pasta, the serving size refers to the amount of cooked product, not dry. Check the total fat in packaged foods. Avoid foods that have saturated fat or trans fats. Check the ingredients list for added sugars, such as corn syrup. Shopping  At the grocery store, buy most of your food from the areas near the walls of the store. This includes: Fresh fruits and vegetables (produce). Grains, beans, nuts, and seeds. Some of these may be available in unpackaged forms or large amounts (in bulk). Fresh seafood. Poultry and eggs. Low-fat dairy products. Buy whole ingredients instead of prepackaged foods. Buy fresh fruits and vegetables in-season from local farmers markets. Buy frozen fruits and vegetables in resealable bags. If you do not have access to quality fresh seafood, buy precooked frozen shrimp or canned fish, such as tuna, salmon, or sardines. Buy small amounts of raw or cooked vegetables, salads, or olives from the deli or salad bar at your store. Stock your pantry so you always have certain foods on hand, such as olive oil, canned tuna, canned tomatoes, rice, pasta, and beans. Cooking  Cook foods with extra-virgin olive oil instead of using butter or other vegetable oils. Have meat as a side dish, and have vegetables or grains as your main dish. This means having meat in small portions or adding small amounts of meat to foods like pasta or  stew. Use beans or vegetables instead of meat in common dishes like chili or lasagna. Experiment with different cooking methods. Try roasting or broiling vegetables instead of steaming or sauteing them. Add frozen vegetables to soups, stews, pasta, or rice. Add nuts or seeds for added healthy fat at each meal. You can add these to yogurt, salads, or vegetable dishes. Marinate fish or vegetables using olive oil, lemon juice, garlic, and fresh herbs. Meal planning  Plan to eat 1 vegetarian meal one day each week. Try to work up to 2 vegetarian meals, if possible. Eat seafood 2 or more times a week. Have healthy snacks readily available, such as: Vegetable sticks with hummus. Greek yogurt. Fruit and nut trail mix. Eat balanced meals throughout the week. This includes: Fruit: 2-3 servings a day Vegetables: 4-5 servings a day Low-fat dairy: 2 servings a day Fish, poultry, or lean meat: 1 serving a day Beans and legumes: 2 or more servings a week Nuts and seeds: 1-2 servings a day Whole grains: 6-8 servings a day Extra-virgin olive oil: 3-4 servings a day Limit red meat and sweets to only a few servings a month What are my food choices? Mediterranean diet Recommended Grains: Whole-grain pasta. Brown rice. Bulgar wheat. Polenta. Couscous. Whole-wheat bread. [place]. Vegetables: Artichokes. Beets. Broccoli.  Cabbage. Carrots. Eggplant. Green beans. Chard. Kale. Spinach. Onions. Leeks. Peas. Squash. Tomatoes. Peppers. Radishes. Fruits: Apples. Apricots. Avocado. Berries. Bananas. Cherries. Dates. Figs. Grapes. Lemons. Melon. Oranges. Peaches. Plums. Pomegranate. Meats and other protein foods: Beans. Almonds. Sunflower seeds. Pine nuts. Peanuts. Goldenrod. Salmon. Scallops. Shrimp. [place]. Tilapia. Clams. Oysters. Eggs. Dairy: Low-fat milk. Cheese. Greek yogurt. Beverages: Water. Red wine. Herbal tea. Fats and oils: Extra virgin olive oil. Avocado oil. Grape seed oil. Sweets and desserts:  [place] yogurt with honey. Baked apples. Poached pears. Trail mix. Seasoning and other foods: Basil. Cilantro. Coriander. Cumin. Mint. Parsley. Sage. Rosemary. Tarragon. Garlic. Oregano. Thyme. Pepper. Balsalmic vinegar. Tahini. Hummus. Tomato sauce. Olives. Mushrooms. Limit these Grains: Prepackaged pasta or rice dishes. Prepackaged cereal with added sugar. Vegetables: Deep fried potatoes (french fries). Fruits: Fruit canned in syrup. Meats and other protein foods: Beef. Pork. Lamb. Poultry with skin. Hot dogs. Bernie[place] Salines. Dairy: Ice cream. Sour cream. Whole milk. Beverages: Juice. Sugar-sweetened soft drinks. Beer. Liquor and spirits. Fats and oils: Butter. Canola oil. Vegetable oil. Beef fat (tallow). Lard. Sweets and desserts: Cookies. Cakes. Pies. Candy. Seasoning and other foods: Mayonnaise. Premade sauces and marinades. The items listed may not be a complete list. Talk with your dietitian about what dietary choices are right for you. Summary The Mediterranean diet includes both food and lifestyle choices. Eat a variety of fresh fruits and vegetables, beans, nuts, seeds, and whole grains. Limit the amount of red meat and sweets that you eat. Talk with your health care provider about whether it is safe for you to drink red wine in moderation. This means 1 glass a day for nonpregnant women and 2 glasses a day for men. A glass of wine equals 5 oz (150 mL). This information is not intended to replace advice given to you by your health care provider. Make sure you discuss any questions you have with your health care provider. Document Released: 09/10/2015 Document Revised: 10/13/2015 Document Reviewed: 09/10/2015 Elsevier Interactive Patient Education  2017 [place] provider has requested that you have labwork completed today. Please go to [facility] ([id number]) on the second floor of this building before leaving the office today. You do not need to check in. If you are not  called within 15 minutes please check with the front desk.

## 2021-12-15 NOTE — Progress Notes (Signed)
Assessment/Plan:   Mild Cognitive Impairment, likely of vascular etiology without behavioral disturbance  Michelle [name] is a very pleasant 88 y.o. Michelle [name] female with a history of hypertension, hyperlipidemia, AAA, osteoporosis, h/o CVA 2009 with residual memory loss, R breast Cancer on anastrozole and h/o Endometrial Ca, Iron deficiency Anemia, COPD, presenting today in follow-up for evaluation of memory loss. Patient is on memantine 5 mg bid, tolerating well. Prior MRI of the brain on May 2022, personally reviewed mild cerebral volume loss without acute findings or evidence of metastases, but with moderate to extensive chronic small vessel ischemia. Neuropsychological evaluation in April 2023, Dr. Melvyn Novas, yielded a diagnosis of mild vascular neurocognitive disorder, with findings not suggestive of Alzheimer's disease.  Patient is stable from the cognitive standpoint, and her mood has improved since her prior visit.    Recommendations:   Follow up in 6  months. Continue memantine 5 mg twice daily Continue to control cardiovascular risk factors Continue to control mood as per PCP Recommend increasing social interaction. Follow-up COPD with her PCP has not had a sleep study in a long time, may benefit of repeating it, as for example, OSA can affect her memory    Subjective:   This patient is accompanied in the office by her daughter Michelle [name], with her other daughter Michelle [name] on the phone.  Who supplements the history. Previous records as well as any outside records available were reviewed prior to todays visit.  She was last seen on 06/03/2021.  Initial MoCA on 2022 was 24/30.    Any changes in memory since last visit?  She has a writing a calendar, otherwise she will forget appointments.  "She continues to forget some names but not often, I go through the alphabet till I get it". She reports that she has to use a key word to recall names, places and things.  She likes to do the Bible word search.  When  operating electronics such as iPhone, the instructions have to be repeated.  She admits to not engaging in more social activities. Repeats oneself?  Not often. Disoriented when walking into a room?  Patient denies   Leaving objects in unusual places?  Patient denies   Ambulates  with difficulty?   Patient denies   Recent falls?  Patient denies   Any head injuries?  Patient denies   History of seizures?   Patient denies   Wandering behavior?  Patient denies   Patient drives? No longer drives. Any mood changes since last visit?  Memantine has helped with her mood ". "Not as aggressive as she was". She watches the news frequently, which may contribute to some negativity in her mood. Any worsening depression?:  Patient denies   Hallucinations?  Patient denies   Paranoia?  Patient denies   Patient reports that sleeps well without vivid dreams, REM behavior or sleepwalking.  Sometimes she takes Tylenol PM to help her sleep History of sleep apnea?  Patient denies   Any hygiene concerns?  Patient denies   Independent of bathing and dressing?  Endorsed  Does the patient needs help with medications?  Daughter supervises the medications Who is in charge of the finances?  Daughter is in charge    Any changes in appetite?  Patient has been using the [place] on a regular basis.   Patient have trouble swallowing? Patient denies   Does the patient cook?  Patient denies   Any kitchen accidents such as leaving the stove on? Patient denies  Any headaches?  Patient denies   Double vision? Patient denies   Any focal numbness or tingling?  Patient denies   Chronic back pain Patient denies   Unilateral weakness?  Patient denies   Any tremors?  Patient denies   Any history of anosmia?  Patient denies   Any incontinence of urine?  Patient denies   Any bowel dysfunction?   Patient denies      Patient lives  she lives with her daughter Michelle [name].     Initial evaluation 08/27/2020 the patient is seen  in neurologic consultation at the request of Hali Marry, * for the evaluation of memory.  The patient is accompanied by daughter Michelle [name] who supplements the history.  Her other daughter Michelle [name] is on the phone with [place].  The patient is an 88 y.o. year old female who has had memory issues since her stroke in 2009.  However, the symptoms became more noticeable after a day sudden death of her husband to massive heart attack in December 2021.  This caused significant emotional stress on the patient and she reports "now I am fine mainly coming out of it, but it was a rough 6 or 7 months ".  At that time, she could not sleep more than 3 hours a night, and was losing the confidence of being on my own, increasingly anxious, and having less patience ".  She moved a couple of months ago to be with her daughter, and now she is beginning to increase her activity, and going out of the house more often, although she feels that the memory loss "is still there ". She has been seen by her PCP, who suspects mild cognitive impairment.  She is in no medications at this time.  She has never been seen by a neurologist until now. Her daughter reports that she repeats the same questions, also tells the same stories for at least a year. Denies hallucinations or paranoia.  She denies acting out in her dreams, or sleepwalking.  Denies issues with bathing or dressing.  Denies missing any medications.  She does her own finances and denies missing any meals.  She denies leaving objects in unusual places.  Her appetite is good, denies trouble swallowing.  She cooks, and denies leaving the stove or the faucet on.  She used to drive until recently, but "the daughter took the keys of the car because she is afraid I am going to get into an accident " Michelle [name] states that there was one episode where she did not recall how to get to the hair salon despite having known this place for a long time, in which the patient interjects "well I just moved to a  different place and I am trying to learn my way around, and the roads changed ".  She ambulates without difficulty without a walker or a cane, and denies any recent falls, or trauma to the head. She denies any double vision, dizziness, focal numbness or tingling, unilateral weakness or tremors.  She denies anosmia.  Denies a history of sleep apnea, alcohol.  She quit in 1998 the use of tobacco.  Family history remarkable for maternal grandfather with dementia. She has 12th grade education.   Labs 06/23/2020 Vitamin D 25 hydroxy 66.  31, normal CMP normal CBC remarkable for hemoglobin 11.8, hematocrit 34.5 remarkable for mild anemia   MRI of the brain with and without contrast 06/16/2020 for memory loss  1. No reversible finding or evidence of metastatic disease. 2.  Moderate to extensive chronic small vessel ischemia with notable progression from 2009. 3. Mild cerebral volume loss since prior without specific pattern.  Neuropsychological evaluation in April 2023, Dr. Melvyn Novas, yielded a diagnosis of mild vascular neurocognitive disorder briefly, results suggested weaknesses in expressive language (e.g., word finding and articulating herself quickly and clearly) and information retrieval. This was evidenced by primary deficits across verbal fluency and confrontation naming tasks, as well as delayed recall across memory tasks (with retention scores ranging from 0% to 56%). She did exhibit an isolated impairment across a line orientation task; however, this could have been influenced by her history of macular degeneration and other constructional tasks were appropriate. Regarding etiology, the most likely culprit at the present time appears to be vascular in nature (i.e., mild vascular neurocognitive disorder). Past brain scans have revealed severe and certainly age-advanced small vessel disease which has progressed over the years. It also revealed a remote left PICA infarct. Vascular changes to the brain can  certainly affect expressive language, as well as create inefficiencies in accessing previously learned information spontaneously. While left PICA infarcts often have greater physical ramifications than cognitive limitations, her pattern of weakness across testing would be reasonable when accompanied with extensive cerebrovascular changes. Overall, this presentation appears most likely.      Past Medical History:  Diagnosis Date   Abdominal aortic aneurysm (AAA) 3.0 cm to 5.0 cm in diameter in female 11/18/2016   Due for repeat in 12/2019.   Allergic rhinitis 05/26/2020   Aneurysm of infrarenal abdominal aorta 11/19/2013   2.6 cm in 2006 on CT.  CT 10/15 show 3 cm lesion.  [place]  2018 - no change   Arthritis    hips, lower back   Basal cell carcinoma 2011   R back   Bell's palsy 1984   Bilateral cold feet 03/28/2019   Blood transfusion without reported diagnosis 07/2015   Bradycardia    Cataract    Cholecystitis 2004   Chronic constipation 08/14/2018   Clotting disorder    Complication of anesthesia    COPD (chronic obstructive pulmonary disease)    Essential hypertension, benign 09/26/2007   GERD 11/03/2005   Hiatal hernia 2008   History of CVA (cerebrovascular accident) 09/24/2007   left cerebellar infarction, PICA territory   History of endometrial cancer 04/30/2013   uterine   History of GI bleed 07/04/2015   Hyperlipidemia 11/03/2005   LDL goal <100   Interstitial lung disease 03/28/2019   Iron deficiency anemia, unspecified    Macular degeneration    Malignant neoplasm of upper-outer quadrant of right breast in female, estrogen receptor positive 02/21/2011   Mild vascular neurocognitive disorder 05/14/2021   Osteoporosis 11/19/2014   DEXA 10/27/14.  Repeat in 2 yr.  High risk to start medication.    Polyp of colon 2008, 2014   Benign   PONV (postoperative nausea and vomiting)    Primary osteoarthritis of right knee 06/22/2015   Shingles 2008   Squamous cell skin  cancer, face 03/22/2012   Actively seeing dermatology.   Tendonitis    rt hand   Venous stasis of both lower extremities 08/14/2018   Vocal cord polyp      Past Surgical History:  Procedure Laterality Date   ABDOMINAL HYSTERECTOMY  2005   APPENDECTOMY     BASAL CELL CARCINOMA EXCISION     biospy  10/28/2009   Shave Biospy skin Left hand(Acitinic Keratoses), Right Upper Arm (superficial Basal Cell), Right Upper Back(Superficial Basal Cell), Left Neck (  Solar Lentigo & Seborrheic Keratoses)   BREAST BIOPSY  02/11/2011   Right Breast Needle Core Biospy - Upper Outer Quadrant; ER/PR 100%, Her-2 Neu neg.; Ki-67 10%   BREAST LUMPECTOMY WITH RADIOACTIVE SEED LOCALIZATION Right 06/12/2018   Procedure: RIGHT BREAST LUMPECTOMY WITH RADIOACTIVE SEED LOCALIZATION;  Surgeon: Rolm Bookbinder, MD;  Location: [facility];  Service: General;  Laterality: Right;   BREAST MAMMOSITE  03/14/2011   Procedure: MAMMOSITE BREAST;  Surgeon: Rolm Bookbinder, MD;  Location: [facility];  Service: General;  Laterality: Right;   BREAST SURGERY     right breast lumpectomy snbx   CHOLECYSTECTOMY  2004   COLONOSCOPY     COLONOSCOPY W/ POLYPECTOMY     2008, 2014 (benign)   COLONOSCOPY WITH PROPOFOL N/A 07/09/2015   Procedure: COLONOSCOPY WITH PROPOFOL;  Surgeon: Wonda Horner, MD;  Location: [facility] ENDOSCOPY;  Service: Endoscopy;  Laterality: N/A;   DILATION AND CURETTAGE OF UTERUS N/A 04/24/2013   Procedure: DILATATION AND CURETTAGE;  Surgeon: Guss Bunde, MD;  Location: [facility];  Service: Gynecology;  Laterality: N/A;   ESOPHAGOGASTRODUODENOSCOPY N/A 07/06/2015   Procedure: ESOPHAGOGASTRODUODENOSCOPY (EGD);  Surgeon: Wilford Corner, MD;  Location: [facility] ENDOSCOPY;  Service: Endoscopy;  Laterality: N/A;   HYSTEROSCOPY N/A 04/24/2013   Procedure: HYSTEROSCOPY;  Surgeon: Guss Bunde, MD;  Location: [facility];  Service: Gynecology;  Laterality: N/A;   ROBOTIC ASSISTED TOTAL  HYSTERECTOMY WITH BILATERAL SALPINGO OOPHERECTOMY Bilateral 06/11/2013   Procedure: ROBOTIC ASSISTED TOTAL HYSTERECTOMY WITH BILATERAL SALPINGO OOPHORECTOMY WITH POSSIBLE STAGING, EPISIOTOMY;  Surgeon: Janie Morning, MD;  Location: [facility];  Service: Gynecology;  Laterality: Bilateral;   SQUAMOUS CELL CARCINOMA EXCISION     TEAR DUCT PROBING     TONSILLECTOMY     vocal cord poylps  1969   excision     PREVIOUS MEDICATIONS:   CURRENT MEDICATIONS:  Outpatient Encounter Medications as of 12/15/2021  Medication Sig   acetaminophen (TYLENOL) 325 MG tablet Take 650 mg by mouth as needed for mild pain.    anastrozole (ARIMIDEX) 1 MG tablet Take 1 tablet (1 mg total) by mouth daily.   atorvastatin (LIPITOR) 20 MG tablet TAKE 1 TABLET BY MOUTH  EVERY OTHER NIGHT   cholecalciferol (VITAMIN D3) 25 MCG (1000 UT) tablet Take 1,000 Units by mouth daily.   clopidogrel (PLAVIX) 75 MG tablet TAKE 1 TABLET BY MOUTH  DAILY   famotidine (PEPCID) 20 MG tablet TAKE 1 TABLET BY MOUTH TWICE  DAILY   fluticasone (FLONASE) 50 MCG/ACT nasal spray Place 2 sprays into both nostrils daily. (Patient taking differently: Place 2 sprays into both nostrils as needed for allergies.)   losartan (COZAAR) 25 MG tablet TAKE 1 TABLET BY MOUTH  DAILY   memantine (NAMENDA) 5 MG tablet TAKE 1 TABLET BY MOUTH TWICE  DAILY   Multiple Vitamins-Minerals (ICAPS AREDS 2 PO) Take 1 capsule by mouth 2 (two) times daily at 10 AM and 5 PM. Morning and evening   VIACTIV 500-500-40 MG-UNT-MCG CHEW CHEW 1 TABLET BY MOUTH EVERY MORNING.   No facility-administered encounter medications on file as of 12/15/2021.     Objective:     PHYSICAL EXAMINATION:    VITALS:   Vitals:   12/15/21 1125  BP: 134/77  Pulse: 74  SpO2: 95%  Weight: 174 lb 12.8 oz (79.3 kg)  Height: _0  (1.702 m)    GEN:  The patient appears stated age and is in NAD. HEENT:  Normocephalic, atraumatic.   Neurological examination:  General: NAD, well-groomed,  appears stated age. Orientation: The patient is alert. Oriented to person, place and date Cranial nerves: There is good facial symmetry.The speech is fluent and clear. No aphasia or dysarthria. Fund of knowledge is appropriate. Recent memory impaired and remote memory is normal.  Attention and concentration are normal.  Able to name objects and repeat phrases.  Hearing is intact to conversational tone.    Sensation: Sensation is intact to light touch throughout Motor: Strength is at least antigravity x4. DTR's 2/4 in UE/LE      08/27/2020   10:00 AM  Montreal Cognitive Assessment   Visuospatial/ Executive (0/5) 4  Naming (0/3) 3  Attention: Read list of digits (0/2) 2  Attention: Read list of letters (0/1) 1  Attention: Serial 7 subtraction starting at 100 (0/3) 2  Language: Repeat phrase (0/2) 2  Language : Fluency (0/1) 0  Abstraction (0/2) 2  Delayed Recall (0/5) 1  Orientation (0/6) 6  Total 23  Adjusted Score (based on education) 24       11/26/2019    2:47 PM 08/14/2018    4:00 PM  MMSE - Mini Mental State Exam  Orientation to time 4 4  Orientation to Place 5 5  Registration 3 3  Attention/ Calculation 5 5  Recall 3 3  Language- name 2 objects 2 2  Language- repeat 1 1  Language- follow 3 step command 3 3  Language- read & follow direction 1 1  Write a sentence 1 1  Copy design 1 1  Total score 29 29       Movement examination: Tone: There is normal tone in the UE/LE Abnormal movements:  no tremor.  No myoclonus.  No asterixis.   Coordination:  There is no decremation with RAM's. Normal finger to nose  Gait and Station: The patient has no difficulty arising out of a deep-seated chair without the use of the hands. The patient's stride length is good.  Gait is cautious and narrow.   Thank you for allowing [place] the opportunity to participate in the care of this nice patient. Please do not hesitate to contact us for any questions or concerns.   Total time spent on  today's visit was 53 minutes dedicated to this patient today, preparing to see patient, examining the patient, ordering tests and/or medications and counseling the patient, documenting clinical information in the EHR or other health record, independently interpreting results and communicating results to the patient/family, discussing treatment and goals, answering patient's questions and coordinating care.  Cc:  Hali Marry, MD  Sharene Butters 12/15/2021 12:37 PM

## 2021-12-16 ENCOUNTER — Other Ambulatory Visit: Payer: Self-pay | Admitting: Family Medicine

## 2021-12-17 ENCOUNTER — Encounter: Payer: Self-pay | Admitting: Family Medicine

## 2021-12-20 DIAGNOSIS — D044 Carcinoma in situ of skin of scalp and neck: Secondary | ICD-10-CM | POA: Diagnosis not present

## 2021-12-20 DIAGNOSIS — L810 Postinflammatory hyperpigmentation: Secondary | ICD-10-CM | POA: Diagnosis not present

## 2021-12-20 DIAGNOSIS — L570 Actinic keratosis: Secondary | ICD-10-CM | POA: Diagnosis not present

## 2021-12-20 DIAGNOSIS — Z85828 Personal history of other malignant neoplasm of skin: Secondary | ICD-10-CM | POA: Diagnosis not present

## 2022-01-02 ENCOUNTER — Encounter: Payer: Self-pay | Admitting: Family Medicine

## 2022-02-07 ENCOUNTER — Ambulatory Visit (INDEPENDENT_AMBULATORY_CARE_PROVIDER_SITE_OTHER): Payer: Medicare Other | Admitting: Family Medicine

## 2022-02-07 DIAGNOSIS — Z0000 Encounter for general adult medical examination without abnormal findings: Secondary | ICD-10-CM

## 2022-02-07 NOTE — Progress Notes (Signed)
MEDICARE ANNUAL WELLNESS VISIT  02/07/2022  Telephone Visit Disclaimer This Medicare AWV was conducted by telephone due to national recommendations for restrictions regarding the COVID-19 Pandemic (e.g. social distancing).  I verified, using two identifiers, that I am speaking with Michelle [name] or their authorized healthcare agent. I discussed the limitations, risks, security, and privacy concerns of performing an evaluation and management service by telephone and the potential availability of an in-person appointment in the future. The patient expressed understanding and agreed to proceed.  Location of Patient: Home Location of Provider (nurse):  In the office.  Subjective:    Michelle [name] is a 89 y.o. female patient of Metheney, Rene Kocher, MD who had a Medicare Annual Wellness Visit today via telephone. Michelle [name] is Retired and lives with their daughter. she has 2 children. she reports that she is socially active and does interact with friends/family regularly. she is minimally physically active and enjoys going to church, puzzles, spending time with friends and family.  Patient Care Team: Hali Marry, MD as PCP - General (Family Medicine) Rolm Bookbinder, MD as Attending Physician (Dermatology) Monna Fam, MD as Attending Physician (Ophthalmology) Magrinat, [name], MD (Inactive) as Consulting Physician (Oncology) Rolm Bookbinder, MD as Consulting Physician (General Surgery) Kyung Rudd, MD as Consulting Physician (Radiation Oncology) Ronald Lobo, MD as Consulting Physician (Gastroenterology) Elease Hashimoto (Neurology)     02/07/2022    1:49 PM 12/15/2021   11:29 AM 06/03/2021    2:47 PM 02/02/2021   11:28 AM 06/04/2018    3:19 PM 04/18/2018    2:11 PM 03/29/2018    2:56 PM  Advanced Directives  Does Patient Have a Medical Advance Directive? Yes Yes Yes Yes Yes Yes Yes  Type of Advance Directive Living will   Living will;Healthcare Power of [place];Living will [place];Living will [place];Living will  Does patient want to make changes to medical advance directive? No - Patient declined   No - Patient declined  No - Patient declined No - Patient declined  Copy of [place] in Chart?    No - copy requested No - copy requested No - copy requested No - copy requested    Hospital Utilization Over the Past 12 Months: # of hospitalizations or ER visits: 0 # of surgeries: 0  Review of Systems    Patient reports that her overall health is unchanged compared to last year.  History obtained from chart review and the patient  Patient Reported Readings (BP, Pulse, CBG, Weight, etc) none  Pain Assessment Pain : No/denies pain     Current Medications & Allergies (verified) Allergies as of 02/07/2022       Reactions   Benadryl [diphenhydramine Hcl]    Hyper   Doxycycline Other (See Comments)   acid reflux   Simvastatin Other (See Comments)   REACTION: memory loss   Fosamax [alendronate Sodium] Other (See Comments)   GERD   Lisinopril Other (See Comments)   Dry cough   Nsaids Other (See Comments)   GI Bleed        Medication List        Accurate as of February 07, 2022  2:01 PM. If you have any questions, ask your nurse or doctor.          acetaminophen 325 MG tablet Commonly known as: TYLENOL Take 650 mg by mouth as needed for mild pain.   anastrozole 1  MG tablet Commonly known as: ARIMIDEX Take 1 tablet (1 mg total) by mouth daily.   atorvastatin 20 MG tablet Commonly known as: LIPITOR TAKE 1 TABLET BY MOUTH EVERY  OTHER AT NIGHT   cholecalciferol 25 MCG (1000 UNIT) tablet Commonly known as: VITAMIN D3 Take 1,000 Units by mouth daily.   clopidogrel 75 MG tablet Commonly known as: PLAVIX TAKE 1 TABLET BY MOUTH DAILY   famotidine 20 MG tablet Commonly known as: PEPCID TAKE 1 TABLET BY MOUTH TWICE  DAILY   fluticasone 50 MCG/ACT  nasal spray Commonly known as: FLONASE Place 2 sprays into both nostrils daily. What changed:  when to take this reasons to take this   ICAPS AREDS 2 PO Take 1 capsule by mouth 2 (two) times daily at 10 AM and 5 PM. Morning and evening   losartan 25 MG tablet Commonly known as: COZAAR TAKE 1 TABLET BY MOUTH DAILY   memantine 5 MG tablet Commonly known as: NAMENDA TAKE 1 TABLET BY MOUTH TWICE  DAILY   Viactiv [phone] MG-UNT-MCG Chew Generic drug: Calcium-Vitamin D-Vitamin K CHEW 1 TABLET BY MOUTH EVERY MORNING.        History (reviewed): Past Medical History:  Diagnosis Date   Abdominal aortic aneurysm (AAA) 3.0 cm to 5.0 cm in diameter in female 11/18/2016   Due for repeat in 12/2019.   Allergic rhinitis 05/26/2020   Aneurysm of infrarenal abdominal aorta 11/19/2013   2.6 cm in 2006 on CT.  CT 10/15 show 3 cm lesion.  [place]  2018 - no change   Arthritis    hips, lower back   Basal cell carcinoma 2011   R back   Bell's palsy 1984   Bilateral cold feet 03/28/2019   Blood transfusion without reported diagnosis 07/2015   Bradycardia    Cataract    Cholecystitis 2004   Chronic constipation 08/14/2018   Clotting disorder    Complication of anesthesia    COPD (chronic obstructive pulmonary disease)    Essential hypertension, benign 09/26/2007   GERD 11/03/2005   Hiatal hernia 2008   History of CVA (cerebrovascular accident) 09/24/2007   left cerebellar infarction, PICA territory   History of endometrial cancer 04/30/2013   uterine   History of GI bleed 07/04/2015   Hyperlipidemia 11/03/2005   LDL goal <100   Interstitial lung disease 03/28/2019   Iron deficiency anemia, unspecified    Macular degeneration    Malignant neoplasm of upper-outer quadrant of right breast in female, estrogen receptor positive 02/21/2011   Mild vascular neurocognitive disorder 05/14/2021   Osteoporosis 11/19/2014   DEXA 10/27/14.  Repeat in 2 yr.  High risk to start medication.     Polyp of colon 2008, 2014   Benign   PONV (postoperative nausea and vomiting)    Primary osteoarthritis of right knee 06/22/2015   Shingles 2008   Squamous cell skin cancer, face 03/22/2012   Actively seeing dermatology.   Tendonitis    rt hand   Venous stasis of both lower extremities 08/14/2018   Vocal cord polyp    Past Surgical History:  Procedure Laterality Date   ABDOMINAL HYSTERECTOMY  2005   APPENDECTOMY     BASAL CELL CARCINOMA EXCISION     biospy  10/28/2009   Shave Biospy skin Left hand(Acitinic Keratoses), Right Upper Arm (superficial Basal Cell), Right Upper Back(Superficial Basal Cell), Left Neck ( Solar Lentigo & Seborrheic Keratoses)   BREAST BIOPSY  02/11/2011   Right Breast Needle Core Biospy - Upper  Outer Quadrant; ER/PR 100%, Her-2 Neu neg.; Ki-67 10%   BREAST LUMPECTOMY WITH RADIOACTIVE SEED LOCALIZATION Right 06/12/2018   Procedure: RIGHT BREAST LUMPECTOMY WITH RADIOACTIVE SEED LOCALIZATION;  Surgeon: Rolm Bookbinder, MD;  Location: [facility];  Service: General;  Laterality: Right;   BREAST MAMMOSITE  03/14/2011   Procedure: MAMMOSITE BREAST;  Surgeon: Rolm Bookbinder, MD;  Location: [place];  Service: General;  Laterality: Right;   BREAST SURGERY     right breast lumpectomy snbx   CHOLECYSTECTOMY  2004   COLONOSCOPY     COLONOSCOPY W/ POLYPECTOMY     2008, 2014 (benign)   COLONOSCOPY WITH PROPOFOL N/A 07/09/2015   Procedure: COLONOSCOPY WITH PROPOFOL;  Surgeon: Wonda Horner, MD;  Location[facility];  Service: Endoscopy;  Laterality: N/A;   DILATION AND CURETTAGE OF UTERUS N/A 04/24/2013   Procedure: DILATATION AND CURETTAGE;  Surgeon: Guss Bunde, MD;  Location: [facility];  Service: Gynecology;  Laterality: N/A;   ESOPHAGOGASTRODUODENOSCOPY N/A 07/06/2015   Procedure: ESOPHAGOGASTRODUODENOSCOPY (EGD);  Surgeon: Wilford Corner, MD;  Location: [facility];  Service: Endoscopy;  Laterality: N/A;   HYSTEROSCOPY  N/A 04/24/2013   Procedure: HYSTEROSCOPY;  Surgeon: Guss Bunde, MD;  Location: [facility];  Service: Gynecology;  Laterality: N/A;   ROBOTIC ASSISTED TOTAL HYSTERECTOMY WITH BILATERAL SALPINGO OOPHERECTOMY Bilateral 06/11/2013   Procedure: ROBOTIC ASSISTED TOTAL HYSTERECTOMY WITH BILATERAL SALPINGO OOPHORECTOMY WITH POSSIBLE STAGING, EPISIOTOMY;  Surgeon: Janie Morning, MD;  Location: [facility];  Service: Gynecology;  Laterality: Bilateral;   SQUAMOUS CELL CARCINOMA EXCISION     TEAR DUCT PROBING     TONSILLECTOMY     vocal cord poylps  1969   excision   Family History  Problem Relation Age of Onset   Heart disease Father    Cancer Sister        Lymphoma   Breast cancer Sister    Cancer Paternal Aunt        Breast cancer   Cancer Daughter        Bilateral Breast Cancer   Breast cancer Daughter 58   Cancer Daughter        Breast Cancer   Breast cancer Daughter    Cancer Sister        2005 - Renal Cell Cancer and Breast Cancer   Breast cancer Sister    Social History   Socioeconomic History   Marital status: Widowed    Spouse name: Not on file   Number of children: 2   Years of education: 12   Highest education level: High school graduate  Occupational History   Occupation: Retired    Comment: Science writer; Mining engineer  Tobacco Use   Smoking status: Former    Packs/day: 1.00    Years: 30.00    Total pack years: 30.00    Types: Cigarettes    Quit date: 02/01/1996    Years since quitting: 26.0   Smokeless tobacco: Never  Vaping Use   Vaping Use: Never used  Substance and Sexual Activity   Alcohol use: Not Currently   Drug use: No   Sexual activity: Yes    Partners: Male    Birth control/protection: Surgical, None    Comment: Menarche age 66, menopause age 48, HRT x 1year  Other Topics Concern   Not on file  Social History Narrative   Lives with her daughter. Her husband passed away one year ago. It has been a difficult year for her. She does go to church and  stays in  touch with her friends. She enjoys spending time with her friends and family.   Left handed   Social Determinants of Health   Financial Resource Strain: Low Risk  (02/07/2022)   Overall Financial Resource Strain (CARDIA)    Difficulty of Paying Living Expenses: Not hard at all  Food Insecurity: No Food Insecurity (02/07/2022)   Hunger Vital Sign    Worried About Running Out of Food in the Last Year: Never true    Ran Out of Food in the Last Year: Never true  Transportation Needs: No Transportation Needs (02/07/2022)   PRAPARE - Hydrologist (Medical): No    Lack of Transportation (Non-Medical): No  Physical Activity: Inactive (02/07/2022)   Exercise Vital Sign    Days of Exercise per Week: 0 days    Minutes of Exercise per Session: 0 min  Stress: No Stress Concern Present (02/07/2022)   [place]    Feeling of Stress : Only a little  Social Connections: Moderately Isolated (02/07/2022)   Social Connection and Isolation Panel [NHANES]    Frequency of Communication with Friends and Family: More than three times a week    Frequency of Social Gatherings with Friends and Family: More than three times a week    Attends Religious Services: More than 4 times per year    Active Member of [employer] or Organizations: No    Attends Archivist Meetings: Never    Marital Status: Widowed    Activities of Daily Living    02/07/2022    1:56 PM  In your present state of health, do you have any difficulty performing the following activities:  Hearing? 0  Vision? 0  Difficulty concentrating or making decisions? 1  Comment some memory loss  Walking or climbing stairs? 0  Dressing or bathing? 0  Doing errands, shopping? 1  Comment doesn't drive anymore  Preparing Food and eating ? N  Using the Toilet? N  In the past six months, have you accidently leaked urine? Y  Comment stress incontience  Do you have  problems with loss of bowel control? N  Managing your Medications? N  Managing your Finances? N  Housekeeping or managing your Housekeeping? N    Patient Education/ Literacy How often do you need to have someone help you when you read instructions, pamphlets, or other written materials from your doctor or pharmacy?: 1 - Never What is the last grade level you completed in school?: 12th grade and 2 years of college  Exercise Current Exercise Habits: The patient does not participate in regular exercise at present, Exercise limited by: None identified  Diet Patient reports consuming 2 meals a day and 1 snack(s) a day Patient reports that her primary diet is: Regular Patient reports that she does have regular access to food.   Depression Screen    02/07/2022    1:50 PM 02/25/2021    2:08 PM 02/02/2021   11:30 AM 05/26/2020    2:26 PM 07/26/2019    2:08 PM 06/05/2018    1:03 PM 05/25/2017    1:53 PM  PHQ 2/9 Scores  PHQ - 2 Score 0 0 0 1 0 0 0  PHQ- 9 Score     0       Fall Risk    02/07/2022    1:50 PM 12/15/2021   11:29 AM 06/03/2021    2:47 PM 02/25/2021    2:08  PM 02/02/2021   11:30 AM  Fall Risk   Falls in the past year? 0 0 0 0 0  Number falls in past yr: 0 0 0 0 0  Injury with Fall? 0 0 0 0 0  Risk for fall due to : No Fall Risks   No Fall Risks No Fall Risks  Follow up Falls evaluation completed Falls evaluation completed  Falls prevention discussed;Falls evaluation completed Falls evaluation completed     Objective:  Michelle [name] seemed alert and oriented and she participated appropriately during our telephone visit.  Blood Pressure Weight BMI  BP Readings from Last 3 Encounters:  12/15/21 134/77  08/26/21 130/67  07/20/21 (!) 153/74   Wt Readings from Last 3 Encounters:  12/15/21 174 lb 12.8 oz (79.3 kg)  08/26/21 172 lb (78 kg)  07/20/21 175 lb 12.8 oz (79.7 kg)   BMI Readings from Last 1 Encounters:  12/15/21 27.38 kg/m    *Unable to obtain current vital signs,  weight, and BMI due to telephone visit type  Hearing/Vision  Letta Median did not seem to have difficulty with hearing/understanding during the telephone conversation Reports that she has had a formal eye exam by an eye care professional within the past year Reports that she has not had a formal hearing evaluation within the past year *Unable to fully assess hearing and vision during telephone visit type  Cognitive Function:    02/07/2022    1:59 PM 02/02/2021   11:40 AM  6CIT Screen  What Year? 0 points 0 points  What month? 0 points 0 points  What time? 0 points 0 points  Count back from 20 0 points 0 points  Months in reverse 0 points 0 points  Repeat phrase 0 points 0 points  Total Score 0 points 0 points   (Normal:0-7, Significant for Dysfunction: >8)  Normal Cognitive Function Screening: Yes   Immunization & Health Maintenance Record Immunization History  Administered Date(s) Administered   Fluad Quad(high Dose 65+) 11/15/2018, 10/29/2019   Influenza Split 12/08/2010, 11/01/2011, 11/07/2017   Influenza Whole 12/13/2006, 11/19/2008, 11/11/2009   Influenza, High Dose Seasonal PF 11/17/2016   Influenza,inj,Quad PF,6+ Mos 11/22/2012, 11/19/2013, 10/21/2014, 11/10/2015, 11/07/2017   Influenza-Unspecified 11/03/2020, 11/02/2021   Moderna Covid-19 Vaccine Bivalent Booster 42yr & up 11/02/2021   Moderna SARS-COV2 Booster Vaccination 11/10/2020   Moderna Sars-Covid-2 Vaccination 03/05/2019, 04/04/2019, 12/18/2019, 08/20/2020   Pneumococcal Conjugate-13 02/18/2014   Pneumococcal Polysaccharide-23 12/13/2006   RSV,unspecified 11/02/2021   Tdap 10/06/2010, 09/13/2019   Zoster Recombinat (Shingrix) 10/18/2021, 12/17/2021   Zoster, Live 12/08/2010    Health Maintenance  Topic Date Due   COVID-19 Vaccine (6 - 2023-24 season) 02/23/2022 (Originally 12/28/2021)   Medicare Annual Wellness (AWV)  02/08/2023   MAMMOGRAM  06/19/2023   DTaP/Tdap/Td (3 - Td or Tdap) 09/12/2029   Pneumonia  Vaccine 89 Years old  Completed   INFLUENZA VACCINE  Completed   DEXA SCAN  Completed   Zoster Vaccines- Shingrix  Completed   HPV VACCINES  Aged Out   COLONOSCOPY (Pts 45-425yrInsurance coverage will need to be confirmed)  Discontinued       Assessment  This is a routine wellness examination for [facility] Health Maintenance: Due or Overdue There are no preventive care reminders to display for this patient.   Michelle Personsoes not need a referral for Community Assistance: Care Management:   no Social Work:    no Prescription Assistance:  no Nutrition/Diabetes Education:  no  Plan:  Personalized Goals  Goals Addressed               This Visit's Progress     Patient Stated (pt-stated)        Patient would like to increase her activity.       Personalized Health Maintenance & Screening Recommendations  There are no preventive care reminders to display for this patient.  Lung Cancer Screening Recommended: no (Low Dose CT Chest recommended if Age 68-80 years, 30 pack-year currently smoking OR have quit w/in past 15 years) Hepatitis C Screening recommended: no HIV Screening recommended: no  Advanced Directives: Written information was not prepared per patient's request.  Referrals & Orders No orders of the defined types were placed in this encounter.   Follow-up Plan Follow-up with Hali Marry, MD as planned Medicare wellness visit in one year.  AVS printed and mailed to the patient.   I have personally reviewed and noted the following in the patient's chart:   Medical and social history Use of alcohol, tobacco or illicit drugs  Current medications and supplements Functional ability and status Nutritional status Physical activity Advanced directives List of other physicians Hospitalizations, surgeries, and ER visits in previous 12 months Vitals Screenings to include cognitive, depression, and falls Referrals and appointments  In  addition, I have reviewed and discussed with Michelle [name] certain preventive protocols, quality metrics, and best practice recommendations. A written personalized care plan for preventive services as well as general preventive health recommendations is available and can be mailed to the patient at her request.      Tinnie Gens, RN BSN  02/07/2022

## 2022-02-07 NOTE — Patient Instructions (Signed)
[place] Maintenance Summary and Written Plan of Care  Michelle [name] ,  Thank you for allowing me to perform your Medicare Annual Wellness Visit and for your ongoing commitment to your health.   Health Maintenance & Immunization History Health Maintenance  Topic Date Due   COVID-19 Vaccine (6 - 2023-24 season) 02/23/2022 (Originally 12/28/2021)   Medicare Annual Wellness (AWV)  02/08/2023   MAMMOGRAM  06/19/2023   DTaP/Tdap/Td (3 - Td or Tdap) 09/12/2029   Pneumonia Vaccine 34+ Years old  Completed   INFLUENZA VACCINE  Completed   DEXA SCAN  Completed   Zoster Vaccines- Shingrix  Completed   HPV VACCINES  Aged Out   COLONOSCOPY (Pts 45-4yr Insurance coverage will need to be confirmed)  Discontinued   Immunization History  Administered Date(s) Administered   Fluad Quad(high Dose 65+) 11/15/2018, 10/29/2019   Influenza Split 12/08/2010, 11/01/2011, 11/07/2017   Influenza Whole 12/13/2006, 11/19/2008, 11/11/2009   Influenza, High Dose Seasonal PF 11/17/2016   Influenza,inj,Quad PF,6+ Mos 11/22/2012, 11/19/2013, 10/21/2014, 11/10/2015, 11/07/2017   Influenza-Unspecified 11/03/2020, 11/02/2021   Moderna Covid-19 Vaccine Bivalent Booster 131yr& up 11/02/2021   Moderna SARS-COV2 Booster Vaccination 11/10/2020   Moderna Sars-Covid-2 Vaccination 03/05/2019, 04/04/2019, 12/18/2019, 08/20/2020   Pneumococcal Conjugate-13 02/18/2014   Pneumococcal Polysaccharide-23 12/13/2006   RSV,unspecified 11/02/2021   Tdap 10/06/2010, 09/13/2019   Zoster Recombinat (Shingrix) 10/18/2021, 12/17/2021   Zoster, Live 12/08/2010    These are the patient goals that we discussed:  Goals Addressed               This Visit's Progress     Patient Stated (pt-stated)        Patient would like to increase her activity.         This is a list of Health Maintenance Items that are overdue or due now: There are no preventive care reminders to display for this patient.     Orders/Referrals Placed Today: No orders of the defined types were placed in this encounter.  (Contact our referral department at [phone] you have not spoken with someone about your referral appointment within the next 5 days)    Follow-up Plan Follow-up with MeHali MarryMD as planned Medicare wellness visit in one year.  AVS printed and mailed to the patient.      Health Maintenance, Female Adopting a healthy lifestyle and getting preventive care are important in promoting health and wellness. Ask your health care provider about: The right schedule for you to have regular tests and exams. Things you can do on your own to prevent diseases and keep yourself healthy. What should I know about diet, weight, and exercise? Eat a healthy diet  Eat a diet that includes plenty of vegetables, fruits, low-fat dairy products, and lean protein. Do not eat a lot of foods that are high in solid fats, added sugars, or sodium. Maintain a healthy weight Body mass index (BMI) is used to identify weight problems. It estimates body fat based on height and weight. Your health care provider can help determine your BMI and help you achieve or maintain a healthy weight. Get regular exercise Get regular exercise. This is one of the most important things you can do for your health. Most adults should: Exercise for at least 150 minutes each week. The exercise should increase your heart rate and make you sweat (moderate-intensity exercise). Do strengthening exercises at least twice a week. This is in addition to the moderate-intensity exercise. Spend less time  sitting. Even light physical activity can be beneficial. Watch cholesterol and blood lipids Have your blood tested for lipids and cholesterol at 87 years of age, then have this test every 5 years. Have your cholesterol levels checked more often if: Your lipid or cholesterol levels are high. You are older than 87 years of age. You  are at high risk for heart disease. What should I know about cancer screening? Depending on your health history and family history, you may need to have cancer screening at various ages. This may include screening for: Breast cancer. Cervical cancer. Colorectal cancer. Skin cancer. Lung cancer. What should I know about heart disease, diabetes, and high blood pressure? Blood pressure and heart disease High blood pressure causes heart disease and increases the risk of stroke. This is more likely to develop in people who have high blood pressure readings or are overweight. Have your blood pressure checked: Every 3-5 years if you are 25-39 years of age. Every year if you are 23 years old or older. Diabetes Have regular diabetes screenings. This checks your fasting blood sugar level. Have the screening done: Once every three years after age 42 if you are at a normal weight and have a low risk for diabetes. More often and at a younger age if you are overweight or have a high risk for diabetes. What should I know about preventing infection? Hepatitis B If you have a higher risk for hepatitis B, you should be screened for this virus. Talk with your health care provider to find out if you are at risk for hepatitis B infection. Hepatitis C Testing is recommended for: Everyone born from [birth year] through 1963. Anyone with known risk factors for hepatitis C. Sexually transmitted infections (STIs) Get screened for STIs, including gonorrhea and chlamydia, if: You are sexually active and are younger than 87 years of age. You are older than 87 years of age and your health care provider tells you that you are at risk for this type of infection. Your sexual activity has changed since you were last screened, and you are at increased risk for chlamydia or gonorrhea. Ask your health care provider if you are at risk. Ask your health care provider about whether you are at high risk for HIV. Your health care  provider may recommend a prescription medicine to help prevent HIV infection. If you choose to take medicine to prevent HIV, you should first get tested for HIV. You should then be tested every 3 months for as long as you are taking the medicine. Pregnancy If you are about to stop having your period (premenopausal) and you may become pregnant, seek counseling before you get pregnant. Take 400 to 800 micrograms (mcg) of folic acid every day if you become pregnant. Ask for birth control (contraception) if you want to prevent pregnancy. Osteoporosis and menopause Osteoporosis is a disease in which the bones lose minerals and strength with aging. This can result in bone fractures. If you are 55 years old or older, or if you are at risk for osteoporosis and fractures, ask your health care provider if you should: Be screened for bone loss. Take a calcium or vitamin D supplement to lower your risk of fractures. Be given hormone replacement therapy (HRT) to treat symptoms of menopause. Follow these instructions at home: Alcohol use Do not drink alcohol if: Your health care provider tells you not to drink. You are pregnant, may be pregnant, or are planning to become pregnant. If you drink alcohol: Limit  how much you have to: 0-1 drink a day. Know how much alcohol is in your drink. In the [place] one drink equals one 12 oz bottle of beer (355 mL), one 5 oz glass of wine (148 mL), or one 1 oz glass of hard liquor (44 mL). Lifestyle Do not use any products that contain nicotine or tobacco. These products include cigarettes, chewing tobacco, and vaping devices, such as e-cigarettes. If you need help quitting, ask your health care provider. Do not use street drugs. Do not share needles. Ask your health care provider for help if you need support or information about quitting drugs. General instructions Schedule regular health, dental, and eye exams. Stay current with your vaccines. Tell your health care  provider if: You often feel depressed. You have ever been abused or do not feel safe at home. Summary Adopting a healthy lifestyle and getting preventive care are important in promoting health and wellness. Follow your health care provider's instructions about healthy diet, exercising, and getting tested or screened for diseases. Follow your health care provider's instructions on monitoring your cholesterol and blood pressure. This information is not intended to replace advice given to you by your health care provider. Make sure you discuss any questions you have with your health care provider. Document Revised: 06/08/2020 Document Reviewed: 06/08/2020 Elsevier Patient Education  [place].

## 2022-02-23 ENCOUNTER — Other Ambulatory Visit: Payer: Self-pay | Admitting: Hematology and Oncology

## 2022-03-01 ENCOUNTER — Ambulatory Visit (INDEPENDENT_AMBULATORY_CARE_PROVIDER_SITE_OTHER): Payer: Medicare Other | Admitting: Family Medicine

## 2022-03-01 ENCOUNTER — Encounter: Payer: Self-pay | Admitting: Family Medicine

## 2022-03-01 VITALS — BP 132/73 | HR 67 | Wt 174.0 lb

## 2022-03-01 DIAGNOSIS — I10 Essential (primary) hypertension: Secondary | ICD-10-CM | POA: Diagnosis not present

## 2022-03-01 DIAGNOSIS — R0683 Snoring: Secondary | ICD-10-CM

## 2022-03-01 DIAGNOSIS — I999 Unspecified disorder of circulatory system: Secondary | ICD-10-CM | POA: Diagnosis not present

## 2022-03-01 DIAGNOSIS — F0670 Mild neurocognitive disorder due to known physiological condition without behavioral disturbance: Secondary | ICD-10-CM | POA: Diagnosis not present

## 2022-03-01 DIAGNOSIS — R0681 Apnea, not elsewhere classified: Secondary | ICD-10-CM | POA: Diagnosis not present

## 2022-03-01 DIAGNOSIS — R790 Abnormal level of blood mineral: Secondary | ICD-10-CM | POA: Diagnosis not present

## 2022-03-01 DIAGNOSIS — I7143 Infrarenal abdominal aortic aneurysm, without rupture: Secondary | ICD-10-CM | POA: Diagnosis not present

## 2022-03-01 NOTE — Patient Instructions (Signed)
Once I get your sleep study results back we will give you a call to go over the results together.  Will discuss treatment options at that point in time.

## 2022-03-01 NOTE — Assessment & Plan Note (Signed)
Pressure looks great.  Continue current regimen.  Follow-up in 6 months.

## 2022-03-01 NOTE — Assessment & Plan Note (Signed)
Will continue to monitor. Was stable for 5  yrs.

## 2022-03-01 NOTE — Assessment & Plan Note (Addendum)
Followed by Neurology at [facility]. Prior MRI of the brain on May 2022 showed mild cerebral volume loss without acute findings or evidence of metastases, but with moderate to extensive chronic small vessel ischemia. Neuropsychological evaluation in April 2023, Dr. Melvyn Novas, yielded a diagnosis of mild vascular neurocognitive disorder, with findings not suggestive of Alzheimer's disease. On Namenda BID.  08/2018: [facility] 29/30 08/27/20: [place] 23/30 01/2022: [facility] 19/30 The neurologist had wanted [place] to discuss possibly getting her tested for sleep apnea.  She had mentioned at her last office visit that she does snore loudly but at that time she was not willing to undergo evaluation.  Encouraged her to reconsider.

## 2022-03-01 NOTE — Progress Notes (Signed)
Established Patient Office Visit  Subjective   Patient ID: Michelle [name], female    DOB: 06-03-1932  Age: 89 y.o. MRN: [id number]  Chief Complaint  Patient presents with   Hypertension         HPI   Hypertension- Pt denies chest pain, SOB, dizziness, or heart palpitations.  Taking meds as directed w/o problems.  Denies medication side effects.     F/U memory impairment -currently on Namenda.  Following with neurology twice a year.  Neurology really wanted her further evaluated for sleep apnea.  We had previously discussed her snoring but at that time she was not interested in getting testing done.  Snores loudly.  Lateral to be heard through the door.  She has had witnessed apneas.  She falls asleep easily during the day.  She herself and family members have noticed a slight decline in her memory over the last 6 months.     ROS    Objective:     BP 132/73   Pulse 67   Wt 174 lb (78.9 kg)   SpO2 93%   BMI 27.25 kg/m    Physical Exam Vitals and nursing note reviewed.  Constitutional:      Appearance: She is well-developed.  HENT:     Head: Normocephalic and atraumatic.  Cardiovascular:     Rate and Rhythm: Normal rate and regular rhythm.     Heart sounds: Normal heart sounds.  Pulmonary:     Effort: Pulmonary effort is normal.     Breath sounds: Normal breath sounds.  Skin:    General: Skin is warm and dry.  Neurological:     Mental Status: She is alert and oriented to person, place, and time.  Psychiatric:        Behavior: Behavior normal.      No results found for any visits on 03/01/22.    The ASCVD Risk score (Arnett DK, et al., 2019) failed to calculate for the following reasons:   The 2019 ASCVD risk score is only valid for ages 36 to 15    Assessment & Plan:   Problem List Items Addressed This Visit       Cardiovascular and Mediastinum   Essential hypertension, benign - Primary    Pressure looks great.  Continue current regimen.   Follow-up in 6 months.      Relevant Orders   BASIC METABOLIC PANEL WITH GFR   Lipid Panel w/reflex Direct LDL   Ferritin   Aneurysm of infrarenal abdominal aorta (HCC)    Will continue to monitor. Was stable for 5  yrs.       Relevant Orders   BASIC METABOLIC PANEL WITH GFR   Lipid Panel w/reflex Direct LDL   Ferritin     Nervous and Auditory   Mild vascular neurocognitive disorder    Followed by Neurology at [place]. Prior MRI of the brain on May 2022 showed mild cerebral volume loss without acute findings or evidence of metastases, but with moderate to extensive chronic small vessel ischemia. Neuropsychological evaluation in April 2023, Dr. Melvyn Novas, yielded a diagnosis of mild vascular neurocognitive disorder, with findings not suggestive of Alzheimer's disease. On Namenda BID.  08/2018: [facility] 29/30 08/27/20: [place] 23/30 01/2022: [facility] 19/30 The neurologist had wanted [place] to discuss possibly getting her tested for sleep apnea.  She had mentioned at her last office visit that she does snore loudly but at that time she was not willing to undergo evaluation.  Encouraged her to  reconsider.      Relevant Orders   BASIC METABOLIC PANEL WITH GFR   Lipid Panel w/reflex Direct LDL   Ferritin   Other Visit Diagnoses     Low ferritin       Relevant Orders   Ferritin   Snoring       Relevant Orders   Home sleep test   Apnea       Relevant Orders   Home sleep test      Snoring/witnessed apnea-we did discuss doing a home sleep study and moving forward from there to confirm diagnosis.  We did discuss the possibility of CPAP.  Her husband had sleep apnea and her daughter also has sleep apnea.  So if she is somewhat familiar.  She said she might be okay with something like the nasal pillows.  I spent 40 minutes on the day of the encounter to include pre-visit record review, face-to-face time with the patient and post visit ordering of test.   No follow-ups on file.    Beatrice Lecher,  MD

## 2022-03-04 DIAGNOSIS — I10 Essential (primary) hypertension: Secondary | ICD-10-CM | POA: Diagnosis not present

## 2022-03-04 DIAGNOSIS — R790 Abnormal level of blood mineral: Secondary | ICD-10-CM | POA: Diagnosis not present

## 2022-03-04 DIAGNOSIS — I7143 Infrarenal abdominal aortic aneurysm, without rupture: Secondary | ICD-10-CM | POA: Diagnosis not present

## 2022-03-04 DIAGNOSIS — I999 Unspecified disorder of circulatory system: Secondary | ICD-10-CM | POA: Diagnosis not present

## 2022-03-04 DIAGNOSIS — F0670 Mild neurocognitive disorder due to known physiological condition without behavioral disturbance: Secondary | ICD-10-CM | POA: Diagnosis not present

## 2022-03-05 LAB — BASIC METABOLIC PANEL WITH GFR
BUN: 13 mg/dL (ref 7–25)
CO2: 28 mmol/L (ref 20–32)
Calcium: 9.5 mg/dL (ref 8.6–10.4)
Chloride: 105 mmol/L (ref 98–110)
Creat: 0.9 mg/dL (ref 0.60–0.95)
Glucose, Bld: 96 mg/dL (ref 65–99)
Potassium: 4.5 mmol/L (ref 3.5–5.3)
Sodium: 140 mmol/L (ref 135–146)
eGFR: 62 mL/min/{1.73_m2} (ref >=60)

## 2022-03-05 LAB — LIPID PANEL W/REFLEX DIRECT LDL
Cholesterol: 145 mg/dL (ref <200)
HDL: 57 mg/dL (ref >=50)
LDL Cholesterol (Calc): 70 mg/dL (calc)
Non-HDL Cholesterol (Calc): 88 mg/dL (calc) (ref <130)
Total CHOL/HDL Ratio: 2.5 (calc) (ref <5.0)
Triglycerides: 98 mg/dL (ref <150)

## 2022-03-05 LAB — FERRITIN: Ferritin: 41 ng/mL (ref 16–288)

## 2022-03-07 ENCOUNTER — Encounter: Payer: Self-pay | Admitting: Family Medicine

## 2022-03-07 NOTE — Progress Notes (Signed)
Hi Ruchy, overall your labs look great.  Ferritin/iron  stores have been stable.

## 2022-03-08 ENCOUNTER — Other Ambulatory Visit: Payer: Self-pay | Admitting: *Deleted

## 2022-03-08 DIAGNOSIS — R059 Cough, unspecified: Secondary | ICD-10-CM

## 2022-03-08 MED ORDER — ALBUTEROL SULFATE HFA 108 (90 BASE) MCG/ACT IN AERS: 2.0000 | INHALATION_SPRAY | Freq: Four times a day (QID) | RESPIRATORY_TRACT | 2 refills | Status: DC | PRN

## 2022-03-17 ENCOUNTER — Other Ambulatory Visit: Payer: Self-pay | Admitting: *Deleted

## 2022-03-17 DIAGNOSIS — R0683 Snoring: Secondary | ICD-10-CM

## 2022-03-17 DIAGNOSIS — R0681 Apnea, not elsewhere classified: Secondary | ICD-10-CM

## 2022-03-22 DIAGNOSIS — L718 Other rosacea: Secondary | ICD-10-CM | POA: Diagnosis not present

## 2022-03-22 DIAGNOSIS — Z85828 Personal history of other malignant neoplasm of skin: Secondary | ICD-10-CM | POA: Diagnosis not present

## 2022-03-22 DIAGNOSIS — L218 Other seborrheic dermatitis: Secondary | ICD-10-CM | POA: Diagnosis not present

## 2022-03-22 DIAGNOSIS — L820 Inflamed seborrheic keratosis: Secondary | ICD-10-CM | POA: Diagnosis not present

## 2022-03-22 DIAGNOSIS — I788 Other diseases of capillaries: Secondary | ICD-10-CM | POA: Diagnosis not present

## 2022-03-22 DIAGNOSIS — L821 Other seborrheic keratosis: Secondary | ICD-10-CM | POA: Diagnosis not present

## 2022-04-12 ENCOUNTER — Ambulatory Visit (HOSPITAL_BASED_OUTPATIENT_CLINIC_OR_DEPARTMENT_OTHER): Payer: Medicare Other | Attending: Family Medicine | Admitting: Internal Medicine

## 2022-04-12 VITALS — Ht 67.0 in | Wt 174.0 lb

## 2022-04-12 DIAGNOSIS — G4733 Obstructive sleep apnea (adult) (pediatric): Secondary | ICD-10-CM | POA: Diagnosis not present

## 2022-04-12 DIAGNOSIS — R0683 Snoring: Secondary | ICD-10-CM | POA: Insufficient documentation

## 2022-04-12 DIAGNOSIS — G4736 Sleep related hypoventilation in conditions classified elsewhere: Secondary | ICD-10-CM | POA: Diagnosis not present

## 2022-04-12 DIAGNOSIS — R0681 Apnea, not elsewhere classified: Secondary | ICD-10-CM | POA: Insufficient documentation

## 2022-04-14 ENCOUNTER — Other Ambulatory Visit: Payer: Self-pay | Admitting: *Deleted

## 2022-04-14 DIAGNOSIS — R059 Cough, unspecified: Secondary | ICD-10-CM

## 2022-04-14 MED ORDER — ALBUTEROL SULFATE HFA 108 (90 BASE) MCG/ACT IN AERS: 2.0000 | INHALATION_SPRAY | Freq: Four times a day (QID) | RESPIRATORY_TRACT | 3 refills | Status: DC | PRN

## 2022-04-14 MED ORDER — FLUTICASONE PROPIONATE 50 MCG/ACT NA SUSP: 2.0000 | Freq: Every day | NASAL | 3 refills | Status: DC

## 2022-04-17 DIAGNOSIS — R0683 Snoring: Secondary | ICD-10-CM | POA: Diagnosis not present

## 2022-04-17 DIAGNOSIS — R0681 Apnea, not elsewhere classified: Secondary | ICD-10-CM

## 2022-04-17 NOTE — Procedures (Signed)
     Patient Name: Michelle [name], Michelle [name] Date: 04/12/2022 Gender: Female D.O.B: [birth year] Age (years): 89 Referring Provider: Beatrice Lecher Height (inches): 33 Interpreting Physician: Baird Lyons MD, ABSM Weight (lbs): 174 RPSGT: Jacolyn Reedy BMI: 27 MRN: [id number] Neck Size: 13.50  CLINICAL INFORMATION Sleep Study Type: HST Indication for sleep study: Other sleep disorder Epworth Sleepiness Score: 1  SLEEP STUDY TECHNIQUE A multi-channel overnight portable sleep study was performed. The channels recorded were: nasal airflow, thoracic respiratory movement, and oxygen saturation with a pulse oximetry. Snoring was also monitored.  MEDICATIONS Patient self administered medications include: N/A.  SLEEP ARCHITECTURE Patient was studied for 361.5 minutes. The sleep efficiency was 100.0 % and the patient was supine for 0%. The arousal index was 0.0 per hour.  RESPIRATORY PARAMETERS The overall AHI was 61.9 per hour, with a central apnea index of 0 per hour. The oxygen nadir was 74% during sleep.  CARDIAC DATA Mean heart rate during sleep was 54.5 bpm.  IMPRESSIONS - Severe obstructive sleep apnea occurred during this study (AHI = 61.9/h). - Oxygen desaturation was noted during this study (Min O2 = 74%, Mean 93%). - Patient snored.  DIAGNOSIS - Obstructive Sleep Apnea (G47.33) - Nocturnal Hypoxemia (G47.36)  RECOMMENDATIONS - Suggest CPAP titration sleep study or autopap. Other options would be based on clinical judgment. - Be careful with alcohol, sedatives and other CNS depressants that may worsen sleep apnea and disrupt normal sleep architecture. - Sleep hygiene should be reviewed to assess factors that may improve sleep quality. - Weight management and regular exercise should be initiated or continued.  [Electronically signed] 04/17/2022 09:50 AM  Baird Lyons MD, ABSM Diplomate, American Board of Sleep Medicine NPI: [id number]                         [place]  ELECTRONICALLY SIGNED ON:  04/17/2022, 9:47 AM [place] PH: [phone]   FX: [phone] [place]

## 2022-04-18 ENCOUNTER — Telehealth: Payer: Self-pay | Admitting: Family Medicine

## 2022-04-18 NOTE — Telephone Encounter (Signed)
Please call pt and schedule an appointment to discuss her sleep study. [place] for [employer]

## 2022-04-28 ENCOUNTER — Telehealth (INDEPENDENT_AMBULATORY_CARE_PROVIDER_SITE_OTHER): Payer: Medicare Other | Admitting: Family Medicine

## 2022-04-28 ENCOUNTER — Encounter: Payer: Self-pay | Admitting: Family Medicine

## 2022-04-28 DIAGNOSIS — G4733 Obstructive sleep apnea (adult) (pediatric): Secondary | ICD-10-CM | POA: Diagnosis not present

## 2022-04-28 HISTORY — DX: Obstructive sleep apnea (adult) (pediatric): G47.33

## 2022-04-28 MED ORDER — AMBULATORY NON FORMULARY MEDICATION: 99 refills | Status: DC

## 2022-04-28 NOTE — Assessment & Plan Note (Addendum)
Discussed results today and the severity of her obstructive sleep apnea.  Discussed potential treatment options including CPAP etc.  Discussed the importance of good sleep hygiene, avoiding sedatives, and continuing to maintain a healthy BMI stay active  Will order CPAP.  F/U in 3-4 weeks to see how doing on CPAP and make any adjustments needed. Marland Kitchen

## 2022-04-28 NOTE — Progress Notes (Signed)
    Virtual Visit via Video Note  I connected with Michelle [name] on 04/28/22 at  1:00 PM EDT by a video enabled telemedicine application and verified that I am speaking with the correct person using two identifiers.   I discussed the limitations of evaluation and management by telemedicine and the availability of in person appointments. The patient expressed understanding and agreed to proceed.  Patient location: at home Provider location: in office Daughter Michelle [name] present for conversation  Subjective:    CC:   Chief Complaint  Patient presents with   Results    Sleep study     HPI: She is here to go over recent home sleep study results from March 12.  Her overall AHI was 61.9/h with a central apnea index of 0.  Oxygen nadir was 74 during sleep.  Mean heart rate was 54.  Diagnosis consistent with severe obstructive sleep apnea.  She is here today to discuss results and to go over current recommendations.   Past medical history, Surgical history, Family history not pertinant except as noted below, Social history, Allergies, and medications have been entered into the medical record, reviewed, and corrections made.    Objective:    General: Speaking clearly in complete sentences without any shortness of breath.  Alert and oriented x3.  Normal judgment. No apparent acute distress.    Impression and Recommendations:    Problem List Items Addressed This Visit       Respiratory   OSA (obstructive sleep apnea), Severe, AHI 61 - Primary    Discussed results today and the severity of her obstructive sleep apnea.  Discussed potential treatment options including CPAP etc.  Discussed the importance of good sleep hygiene, avoiding sedatives, and continuing to maintain a healthy BMI stay active  Will order CPAP.  F/U in 3-4 weeks to see how doing on CPAP and make any adjustments needed. .      Relevant Medications   AMBULATORY NON FORMULARY MEDICATION    No orders of the defined  types were placed in this encounter.   Meds ordered this encounter  Medications   AMBULATORY NON FORMULARY MEDICATION    Sig: Medication Name: CPAP with humidifier and supplies, set to autopap 5-10 cm water pressure. Please send down load after 2 weeks of use to [phone].    Dispense:  1 Units    Refill:  PRN   I spent 25 minutes on the day of the encounter to include pre-visit record review, face-to-face time with the patient and post visit ordering of test.    I discussed the assessment and treatment plan with the patient. The patient was provided an opportunity to ask questions and all were answered. The patient agreed with the plan and demonstrated an understanding of the instructions.   The patient was advised to call back or seek an in-person evaluation if the symptoms worsen or if the condition fails to improve as anticipated.   Beatrice Lecher, MD

## 2022-05-23 DIAGNOSIS — G4733 Obstructive sleep apnea (adult) (pediatric): Secondary | ICD-10-CM | POA: Diagnosis not present

## 2022-06-04 ENCOUNTER — Other Ambulatory Visit: Payer: Self-pay | Admitting: Family Medicine

## 2022-06-08 DIAGNOSIS — G4733 Obstructive sleep apnea (adult) (pediatric): Secondary | ICD-10-CM | POA: Diagnosis not present

## 2022-06-22 DIAGNOSIS — G4733 Obstructive sleep apnea (adult) (pediatric): Secondary | ICD-10-CM | POA: Diagnosis not present

## 2022-06-29 ENCOUNTER — Ambulatory Visit: Payer: Medicare Other | Admitting: Physician Assistant

## 2022-06-29 DIAGNOSIS — H353132 Nonexudative age-related macular degeneration, bilateral, intermediate dry stage: Secondary | ICD-10-CM | POA: Diagnosis not present

## 2022-06-29 DIAGNOSIS — H04123 Dry eye syndrome of bilateral lacrimal glands: Secondary | ICD-10-CM | POA: Diagnosis not present

## 2022-06-29 DIAGNOSIS — H40013 Open angle with borderline findings, low risk, bilateral: Secondary | ICD-10-CM | POA: Diagnosis not present

## 2022-06-29 DIAGNOSIS — H35033 Hypertensive retinopathy, bilateral: Secondary | ICD-10-CM | POA: Diagnosis not present

## 2022-06-29 LAB — HM DIABETES EYE EXAM

## 2022-07-07 ENCOUNTER — Encounter: Payer: Self-pay | Admitting: Family Medicine

## 2022-07-07 ENCOUNTER — Ambulatory Visit (INDEPENDENT_AMBULATORY_CARE_PROVIDER_SITE_OTHER): Payer: Medicare Other | Admitting: Family Medicine

## 2022-07-07 ENCOUNTER — Ambulatory Visit (INDEPENDENT_AMBULATORY_CARE_PROVIDER_SITE_OTHER): Payer: Medicare Other

## 2022-07-07 DIAGNOSIS — M79671 Pain in right foot: Secondary | ICD-10-CM | POA: Diagnosis not present

## 2022-07-07 DIAGNOSIS — G4733 Obstructive sleep apnea (adult) (pediatric): Secondary | ICD-10-CM

## 2022-07-07 DIAGNOSIS — R600 Localized edema: Secondary | ICD-10-CM | POA: Diagnosis not present

## 2022-07-07 DIAGNOSIS — J849 Interstitial pulmonary disease, unspecified: Secondary | ICD-10-CM | POA: Diagnosis not present

## 2022-07-07 MED ORDER — AMBULATORY NON FORMULARY MEDICATION: 99 refills | Status: DC

## 2022-07-07 NOTE — Progress Notes (Signed)
Established Patient Office Visit  Subjective   Patient ID: Michelle [name], female    DOB: 07-02-1932  Age: 89 y.o. MRN: [id number]  Chief Complaint  Patient presents with   Follow-up    CPAP     HPI  Here today to follow-up on new start CPAP.  Please see prior notes.  She was recently diagnosed with obstructive sleep apnea after family members had noted severe and loud snoring.  AHI of 61.  She started out with a fullface mask but did not tolerate it well so has switched to nasal pillows.  She is tolerating that a little better but is still really having a difficult time getting used to the CPAP machine.  She started the CPAP 44 days ago and has worn it every night.  She only had 1 night where she wore it less than 4 hours and that was the first night.  The AutoPap is set between 5 and 10 cm of water pressure.  Her median was 8.3 for pressure and her max was 9.4.  Her median number of leaks was 22.1.  AHI was 11.5.  Please see scanned in report.  She is also been having some right foot pain over the fourth metatarsal particularly.  She has some chronic edema and lymphedema in both legs and she has been trying to improve some of the swelling in that right foot and that actually has improved some of her pain.  She does not remember specific injury or trauma.    ROS    Objective:     There were no vitals taken for this visit.   Physical Exam Vitals and nursing note reviewed.  Constitutional:      Appearance: She is well-developed.  HENT:     Head: Normocephalic and atraumatic.  Cardiovascular:     Rate and Rhythm: Normal rate and regular rhythm.     Heart sounds: Normal heart sounds.  Pulmonary:     Effort: Pulmonary effort is normal.     Breath sounds: Normal breath sounds.  Skin:    General: Skin is warm and dry.     Comments: Has pitting edema over the top of the entire right foot and into the ankle and lower leg.  She does have some tenderness over the mid fourth  metatarsal.  Nontender over the third and fifth metatarsals.  No pain with flexion of her toes.  Neurological:     Mental Status: She is alert and oriented to person, place, and time.  Psychiatric:        Behavior: Behavior normal.      No results found for any visits on 07/07/22.    The ASCVD Risk score (Arnett DK, et al., 2019) failed to calculate for the following reasons:   The 2019 ASCVD risk score is only valid for ages 14 to 31   The patient has a prior MI or stroke diagnosis    Assessment & Plan:   Problem List Items Addressed This Visit       Respiratory   OSA (obstructive sleep apnea), Severe, AHI 61    Continue with the nasal pillows.  She can also asked the DME supplier for different type next time she orders if she would prefer.  Will go ahead and send an order over to the DME supplier to set her pressure at 8 based on her current download results.  Will plan to follow back up in 6 weeks and see how she is doing  overall and to make sure the AHI is adequate.  Being that her apnea is quite severe and she is using nasal pillows instead of a full facial mask we may or may not be able to completely get her AHI to under 4 but we will certainly try.  Just encouraged her to continue to work at it and try to get used to it it does usually get easier over time.  My hope is that she will notice improvement in how she feels physically and mentally and that that will also be a motivator for continuing with the CPAP.  She has noticed that she is napping a little less.      Relevant Medications   AMBULATORY NON FORMULARY MEDICATION   Interstitial lung disease    Is not had any flares since I last saw her.      Other Visit Diagnoses     Right foot pain    -  Primary   Relevant Orders   DG Foot Complete Right       Right foot pain-recommend getting x-rays since she does have bony tenderness over the mid metatarsal.  Just want a rule out a fracture or stress fracture.  If she  is having a lot of pain with walking she still might benefit from being in a postop shoe for couple of weeks that would be a reasonable option or a hard soled shoe that has some good support.  Return in about 7 weeks (around 08/25/2022) for OSA.    Nani Gasser, MD

## 2022-07-07 NOTE — Assessment & Plan Note (Signed)
Is not had any flares since I last saw her.

## 2022-07-07 NOTE — Assessment & Plan Note (Signed)
Continue with the nasal pillows.  She can also asked the DME supplier for different type next time she orders if she would prefer.  Will go ahead and send an order over to the DME supplier to set her pressure at 8 based on her current download results.  Will plan to follow back up in 6 weeks and see how she is doing overall and to make sure the AHI is adequate.  Being that her apnea is quite severe and she is using nasal pillows instead of a full facial mask we may or may not be able to completely get her AHI to under 4 but we will certainly try.  Just encouraged her to continue to work at it and try to get used to it it does usually get easier over time.  My hope is that she will notice improvement in how she feels physically and mentally and that that will also be a motivator for continuing with the CPAP.  She has noticed that she is napping a little less.

## 2022-07-14 ENCOUNTER — Ambulatory Visit (INDEPENDENT_AMBULATORY_CARE_PROVIDER_SITE_OTHER): Payer: Medicare Other | Admitting: Sports Medicine

## 2022-07-14 DIAGNOSIS — M79671 Pain in right foot: Secondary | ICD-10-CM | POA: Diagnosis not present

## 2022-07-14 NOTE — Progress Notes (Signed)
Hi Michelle [name], xray shows some mild thickening around the bone which can indicate a stress fracture.We would like to have you come by to be fitted for a post op so to wear for 4-6 weeks to give your foot time time to heal.

## 2022-07-15 ENCOUNTER — Encounter: Payer: Self-pay | Admitting: Family Medicine

## 2022-07-15 NOTE — Progress Notes (Signed)
Boot fitting, nurse visit time utilized.

## 2022-07-21 ENCOUNTER — Inpatient Hospital Stay: Payer: Medicare Other | Attending: Hematology and Oncology | Admitting: Hematology and Oncology

## 2022-07-21 ENCOUNTER — Other Ambulatory Visit: Payer: Self-pay

## 2022-07-21 VITALS — BP 150/72 | HR 65 | Temp 97.7°F | Resp 16 | Wt 180.9 lb

## 2022-07-21 DIAGNOSIS — Z90710 Acquired absence of both cervix and uterus: Secondary | ICD-10-CM | POA: Diagnosis not present

## 2022-07-21 DIAGNOSIS — Z90722 Acquired absence of ovaries, bilateral: Secondary | ICD-10-CM | POA: Insufficient documentation

## 2022-07-21 DIAGNOSIS — Z79811 Long term (current) use of aromatase inhibitors: Secondary | ICD-10-CM | POA: Diagnosis not present

## 2022-07-21 DIAGNOSIS — Z170 Estrogen receptor positive status [ER+]: Secondary | ICD-10-CM | POA: Diagnosis not present

## 2022-07-21 DIAGNOSIS — Z9079 Acquired absence of other genital organ(s): Secondary | ICD-10-CM | POA: Diagnosis not present

## 2022-07-21 DIAGNOSIS — C50411 Malignant neoplasm of upper-outer quadrant of right female breast: Secondary | ICD-10-CM | POA: Insufficient documentation

## 2022-07-21 NOTE — Progress Notes (Signed)
[facility]  Telephone:([phone]) [phone] [phone]     ID: Michelle [name] DOB: 08/19/1932  MR#: [id number]  JYN#:[id number]  Patient Care Team: Michelle Games, MD as PCP - General (Family Medicine) Michelle Poisson, MD as Attending Physician (Dermatology) Michelle Flow, MD as Attending Physician (Ophthalmology) Michelle [name], Michelle Hue, MD (Inactive) as Consulting Physician (Oncology) Michelle Loron, MD as Consulting Physician (General Surgery) Michelle Puffer, MD as Consulting Physician (Radiation Oncology) Michelle Redbird, MD as Consulting Physician (Gastroenterology) Michelle [name] (Neurology) Michelle Moulds, MD OTHER MD:  CHIEF COMPLAINT: Recurrent estrogen receptor positive breast cancer  CURRENT TREATMENT: anastrozole  INTERVAL HISTORY:  Michelle [name] returns today for follow up of her recurrent breast cancer.   She is accompanied by her daughter today to the appointment.  She is doing quite well overall.  She is taking anastrozole as prescribed.  She does have some forgetfulness and is taking memantine as well.  She otherwise feels well.  She denies any breast changes today.  She had a metatarsal likely stress injury and is in a boot.  She says she forgot to schedule her mammogram this past May and she will call and schedule it.  No other change in breathing, bowel habits or urinary habits.  No new neurological complaints  COVID 19 VACCINATION STATUS: fully vaccinated Michelle [name]), with booster 12/2019, also had COVID in March 2020   HISTORY OF CURRENT ILLNESS: From the original intake note:  Michelle [name] has a history of right breast invasive ductal carcinoma status-post right lumpectomy with sentinel lymph node biopsy 03/14/2011 ([id number]), for a 0.52 cm, grade 1 invasive ductal carcinoma, both sentinel lymph nodes clear; margins negative; estrogen receptor 100% positive, progesterone receptor 100% positive, both with strong staining intensity, HER2 negative,  with an MIB-1 of 10%. She underwent MammoSite therapy between 03/18/2011 and 03/25/2011.  Antiestrogen therapy was discussed with the patient by my former partner Michelle [name] on 05/19/2011, with the patient declining.  Genetics testing for the BRCA 1 and 2 gene mutations was obtained 02/28/2011 and was negative.  She had routine screening mammography on 03/06/2018 showing a possible abnormality in the right breast. She proceeded to bilateral diagnostic mammography with tomography and right breast ultrasonography at [facility] on the same day showing: a 1.7 irregular, hypoechoic mass at the 11-12 o'clock areolar margin; one normal lymph node was seen.  Accordingly on 03/14/2018 she proceeded to biopsy of the right breast area in question. The pathology ([id number]) from this procedure showed: invasive ductal carcinoma, grade 2. Prognostic indicators significant for: estrogen receptor, 100% positive and progesterone receptor, 100% positive, both with strong staining intensity. Proliferation marker Ki67 at 10%. HER2 negative (1+) by immunohistochemistry.  Of note, she also has a personal history of uterine cancer status post total abdominal hysterectomy and bilateral salpingo-oophorectomy 06/11/2013 for an invasive endometrioid carcinoma of the uterus, grade 1, confined within the inner half of the endometrium.  A total of 3 left pelvic and 10 right pelvic lymph nodes were all negative for a pT1a pN0 stage.   She has also had multiple abscesses for skin cancer (basal cell and squamous cell).  The patient's subsequent history is as detailed below.   PAST MEDICAL HISTORY: Past Medical History:  Diagnosis Date   Abdominal aortic aneurysm (AAA) 3.0 cm to 5.0 cm in diameter in female 11/18/2016   Due for repeat in 12/2019.   Allergic rhinitis 05/26/2020   Aneurysm of infrarenal abdominal aorta 11/19/2013   2.6 cm in 2006 on CT.  CT 10/15 show 3 cm lesion.  [place]  2018 - no change   Arthritis    hips, lower back    Basal cell carcinoma 2011   R back   Bell's palsy 1984   Bilateral cold feet 03/28/2019   Blood transfusion without reported diagnosis 07/2015   Bradycardia    Cataract    Cholecystitis 2004   Chronic constipation 08/14/2018   Clotting disorder    Complication of anesthesia    COPD (chronic obstructive pulmonary disease)    Essential hypertension, benign 09/26/2007   GERD 11/03/2005   Hiatal hernia 2008   History of CVA (cerebrovascular accident) 09/24/2007   left cerebellar infarction, PICA territory   History of endometrial cancer 04/30/2013   uterine   History of GI bleed 07/04/2015   Hyperlipidemia 11/03/2005   LDL goal <100   Interstitial lung disease 03/28/2019   Iron deficiency anemia, unspecified    Macular degeneration    Malignant neoplasm of upper-outer quadrant of right breast in female, estrogen receptor positive 02/21/2011   Mild vascular neurocognitive disorder 05/14/2021   OSA (obstructive sleep apnea), Severe, AHI 61 04/28/2022   Osteoporosis 11/19/2014   DEXA 10/27/14.  Repeat in 2 yr.  High risk to start medication.    Polyp of colon 2008, 2014   Benign   PONV (postoperative nausea and vomiting)    Primary osteoarthritis of right knee 06/22/2015   Shingles 2008   Squamous cell skin cancer, face 03/22/2012   Actively seeing dermatology.   Tendonitis    rt hand   Venous stasis of both lower extremities 08/14/2018   Vocal cord polyp     PAST SURGICAL HISTORY: Past Surgical History:  Procedure Laterality Date   ABDOMINAL HYSTERECTOMY  2005   APPENDECTOMY     BASAL CELL CARCINOMA EXCISION     biospy  10/28/2009   Shave Biospy skin Left hand(Acitinic Keratoses), Right Upper Arm (superficial Basal Cell), Right Upper Back(Superficial Basal Cell), Left Neck ( Solar Lentigo & Seborrheic Keratoses)   BREAST BIOPSY  02/11/2011   Right Breast Needle Core Biospy - Upper Outer Quadrant; ER/PR 100%, Her-2 Neu neg.; Ki-67 10%   BREAST LUMPECTOMY WITH RADIOACTIVE  SEED LOCALIZATION Right 06/12/2018   Procedure: RIGHT BREAST LUMPECTOMY WITH RADIOACTIVE SEED LOCALIZATION;  Surgeon: Michelle Loron, MD;  Location: [facility];  Service: General;  Laterality: Right;   BREAST MAMMOSITE  03/14/2011   Procedure: MAMMOSITE BREAST;  Surgeon: Michelle Loron, MD;  Location: [facility];  Service: General;  Laterality: Right;   BREAST SURGERY     right breast lumpectomy snbx   CHOLECYSTECTOMY  2004   COLONOSCOPY     COLONOSCOPY W/ POLYPECTOMY     2008, 2014 (benign)   COLONOSCOPY WITH PROPOFOL N/A 07/09/2015   Procedure: COLONOSCOPY WITH PROPOFOL;  Surgeon: Graylin Shiver, MD;  Location: [facility];  Service: Endoscopy;  Laterality: N/A;   DILATION AND CURETTAGE OF UTERUS N/A 04/24/2013   Procedure: DILATATION AND CURETTAGE;  Surgeon: Lesly Dukes, MD;  Location: [facility];  Service: Gynecology;  Laterality: N/A;   ESOPHAGOGASTRODUODENOSCOPY N/A 07/06/2015   Procedure: ESOPHAGOGASTRODUODENOSCOPY (EGD);  Surgeon: Charlott Rakes, MD;  Location: [facility];  Service: Endoscopy;  Laterality: N/A;   HYSTEROSCOPY N/A 04/24/2013   Procedure: HYSTEROSCOPY;  Surgeon: Lesly Dukes, MD;  Location: [facility];  Service: Gynecology;  Laterality: N/A;   ROBOTIC ASSISTED TOTAL HYSTERECTOMY WITH BILATERAL SALPINGO OOPHERECTOMY Bilateral 06/11/2013   Procedure: ROBOTIC ASSISTED TOTAL HYSTERECTOMY WITH BILATERAL  SALPINGO OOPHORECTOMY WITH POSSIBLE STAGING, EPISIOTOMY;  Surgeon: Laurette Schimke, MD;  Location: [facility];  Service: Gynecology;  Laterality: Bilateral;   SQUAMOUS CELL CARCINOMA EXCISION     TEAR DUCT PROBING     TONSILLECTOMY     vocal cord poylps  1969   excision    FAMILY HISTORY Family History  Problem Relation Age of Onset   Heart disease Father    Cancer Sister        Lymphoma   Breast cancer Sister    Cancer Paternal Aunt        Breast cancer   Cancer Daughter        Bilateral Breast Cancer   Breast cancer Daughter  82   Cancer Daughter        Breast Cancer   Breast cancer Daughter    Cancer Sister        2005 - Renal Cell Cancer and Breast Cancer   Breast cancer Sister   Patient father was 29 years old when he died from heart issues. Patient mother died from heart issues at age 45. Patient has 3 siblings, 1 brother and 2 sisters.  Patient notes a family hx of breast cancer in both sisters and both daughters. Patient's daughters have had extensive genetics evaluation with no deleterious mutation identified.    GYNECOLOGIC HISTORY:  No LMP recorded. Patient has had a hysterectomy. Menarche: 89 years old Age at first live birth: 89 years old GX P 2 LMP 35, roughly 89 years old Contraceptive unknown HRT? No Hysterectomy? Yes, 06/11/2013 BSO? 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  Her husband Reita Cliche was a IT sales professional.  He died 2020-02-01.  They have 2 daughters, [name] and Volente. The patient attends [employer]. She and her husband share 2 grandchildren and 4 great-grandchildren.    ADVANCED DIRECTIVES: Not in place   HEALTH MAINTENANCE: Social History   Tobacco Use   Smoking status: Former    Packs/day: 1.00    Years: 30.00    Additional pack years: 0.00    Total pack years: 30.00    Types: Cigarettes    Quit date: 02/01/1996    Years since quitting: 26.4   Smokeless tobacco: Never  Vaping Use   Vaping Use: Never used  Substance Use Topics   Alcohol use: Not Currently   Drug use: No     Colonoscopy: 07/09/2015, negative, Dr. Evette Cristal  PAP:  Bone density: Due May 2023   Allergies  Allergen Reactions   Benadryl [Diphenhydramine Hcl]     Hyper   Doxycycline Other (See Comments)    acid reflux   Simvastatin Other (See Comments)    REACTION: memory loss   Fosamax [Alendronate Sodium] Other (See Comments)    GERD   Lisinopril Other (See Comments)    Dry cough   Nsaids Other (See Comments)    GI  Bleed    Current Outpatient Medications  Medication Sig Dispense Refill   acetaminophen (TYLENOL) 325 MG tablet Take 650 mg by mouth as needed for mild pain.      albuterol (VENTOLIN HFA) 108 (90 Base) MCG/ACT inhaler Inhale 2 puffs into the lungs every 6 (six) hours as needed for wheezing or shortness of breath. (Patient taking differently: Inhale 2 puffs into the lungs every 6 (six) hours as needed for wheezing or shortness of breath. Pt would like this to be sent  to [facility] ONLY and when she request this.) 18 g 3   AMBULATORY NON FORMULARY MEDICATION Medication Name: Set CPAP to 8 mm HG water pressure. Please send down load after 4 weeks of use to [phone]. 1 Units PRN   anastrozole (ARIMIDEX) 1 MG tablet TAKE 1 TABLET BY MOUTH DAILY 100 tablet 2   atorvastatin (LIPITOR) 20 MG tablet TAKE 1 TABLET BY MOUTH EVERY  OTHER AT NIGHT 50 tablet 2   cholecalciferol (VITAMIN D3) 25 MCG (1000 UT) tablet Take 1,000 Units by mouth daily.     clopidogrel (PLAVIX) 75 MG tablet TAKE 1 TABLET BY MOUTH DAILY 100 tablet 2   famotidine (PEPCID) 20 MG tablet TAKE 1 TABLET BY MOUTH TWICE  DAILY 200 tablet 2   fluticasone (FLONASE) 50 MCG/ACT nasal spray Place 2 sprays into both nostrils daily. (Patient taking differently: Place 2 sprays into both nostrils as needed for allergies.) 9.9 mL 3   losartan (COZAAR) 25 MG tablet TAKE 1 TABLET BY MOUTH DAILY 100 tablet 2   memantine (NAMENDA) 5 MG tablet TAKE 1 TABLET BY MOUTH TWICE  DAILY 200 tablet 2   Multiple Vitamins-Minerals (ICAPS AREDS 2 PO) Take 1 capsule by mouth 2 (two) times daily at 10 AM and 5 PM. Morning and evening     VIACTIV 500-500-40 MG-UNT-MCG CHEW CHEW 1 TABLET BY MOUTH EVERY MORNING.  12   No current facility-administered medications for this visit.    OBJECTIVE:  white woman who appears stated age  Vitals:   07/21/22 1348  BP: (!) 150/72  Pulse: 65  Resp: 16  Temp: 97.7 F (36.5 C)  SpO2: 96%     Body mass index is 28.33 kg/m.   Wt  Readings from Last 3 Encounters:  07/21/22 180 lb 14.4 oz (82.1 kg)  04/12/22 174 lb (78.9 kg)  03/01/22 174 lb (78.9 kg)      ECOG FS:1 - Symptomatic but completely ambulatory  Physical Exam Constitutional:      Appearance: Normal appearance.  Chest:     Comments: Bilateral breast examined.  No palpable masses or regional adenopathy.  She has postop seroma on the right side which is chronic since surgery.  No change Musculoskeletal:     Cervical back: Normal range of motion and neck supple. No rigidity.  Neurological:     Mental Status: She is alert.       LAB RESULTS:  CMP     Component Value Date/Time   NA 140 03/04/2022 1110   K 4.5 03/04/2022 1110   CL 105 03/04/2022 1110   CO2 28 03/04/2022 1110   GLUCOSE 96 03/04/2022 1110   BUN 13 03/04/2022 1110   BUN 12.3 11/01/2013 1031   CREATININE 0.90 03/04/2022 1110   CREATININE 0.9 11/01/2013 1031   CALCIUM 9.5 03/04/2022 1110   PROT 6.9 08/26/2021 0000   ALBUMIN 3.8 07/20/2021 1418   AST 17 08/26/2021 0000   AST 16 07/20/2021 1418   ALT 9 08/26/2021 0000   ALT 8 07/20/2021 1418   ALKPHOS 98 07/20/2021 1418   BILITOT 0.5 08/26/2021 0000   BILITOT 0.4 07/20/2021 1418   GFRNONAA 55 (L) 07/20/2021 1418   GFRNONAA 52 (L) 11/26/2019 1516   GFRAA 61 11/26/2019 1516    No results found for: "TOTALPROTELP", "ALBUMINELP", "A1GS", "A2GS", "BETS", "BETA2SER", "GAMS", "MSPIKE", "SPEI"  No results found for: "KPAFRELGTCHN", "LAMBDASER", "KAPLAMBRATIO"  Lab Results  Component Value Date   WBC 7.5 08/26/2021   NEUTROABS 5.3 07/20/2021  HGB 13.0 08/26/2021   HCT 37.0 08/26/2021   MCV 97.4 08/26/2021   PLT 229 08/26/2021   Lab Results  Component Value Date   LABCA2 77 (H) 03/08/2011    No components found for: "ZOXWRU045"  No results for input(s): "INR" in the last 168 hours.  Lab Results  Component Value Date   LABCA2 26 (H) 03/08/2011    No results found for: "WUJ811"  No results found for:  "CAN125"  No results found for: "CAN153"  No results found for: "CA2729"  No components found for: "HGQUANT"  No results found for: "CEA1", "CEA" / No results found for: "CEA1", "CEA"   No results found for: "AFPTUMOR"  No results found for: "CHROMOGRNA"  No results found for: "HGBA", "HGBA2QUANT", "HGBFQUANT", "HGBSQUAN" (Hemoglobinopathy evaluation)   Lab Results  Component Value Date   LDH 131 07/04/2015    Lab Results  Component Value Date   IRON 179 (H) 07/04/2015   TIBC 249 (L) 07/04/2015   IRONPCTSAT 72 (H) 07/04/2015   (Iron and TIBC)  Lab Results  Component Value Date   FERRITIN 41 03/04/2022    Urinalysis    Component Value Date/Time   COLORURINE YELLOW 10/21/2013 1645   APPEARANCEUR CLEAR 10/21/2013 1645   LABSPEC 1.013 10/21/2013 1645   PHURINE 5.5 10/21/2013 1645   GLUCOSEU NEG 10/21/2013 1645   HGBUR NEG 10/21/2013 1645   BILIRUBINUR NEG 10/21/2013 1645   KETONESUR NEG 10/21/2013 1645   PROTEINUR NEG 10/21/2013 1645   UROBILINOGEN 0.2 10/21/2013 1645   NITRITE NEG 10/21/2013 1645   LEUKOCYTESUR TRACE (A) 10/21/2013 1645    STUDIES: DG Foot Complete Right  Result Date: 07/13/2022 CLINICAL DATA:  Pain over the mid fourth metatarsal. EXAM: RIGHT FOOT COMPLETE - 3+ VIEW COMPARISON:  None Available. FINDINGS: Slight periosteal thickening about the fourth metatarsal shaft may represent stress reaction. No discrete fracture line is seen. The bones are subjectively under mineralized. Minor degenerative change of the first metatarsal phalangeal joint. No erosion. Moderate-sized plantar calcaneal spur. Minor soft tissue edema over the dorsum of the foot. IMPRESSION: 1. Mild periosteal thickening about the fourth metatarsal shaft may represent stress reaction. No fracture line is seen. 2. Mild soft tissue edema over the dorsum of the foot. Minimal degenerative change at the first metatarsal phalangeal joint. Electronically Signed   By: Narda Rutherford M.D.    On: 07/13/2022 16:10      ELIGIBLE FOR AVAILABLE RESEARCH PROTOCOL: no   ASSESSMENT: 89 y.o. [place] woman  (1) status post right upper outer quadrant lumpectomy 03/14/2011 for a pT1b (0.52 cm), pN0 invasive ductal carcinoma, grade 1, estrogen and progesterone receptor positive, HER-2 nonamplified, with an MIB-1 of 10%; a total of 2 axillary lymph nodes were removed.  (a) status post MammoSite radiation between 03/17/2018 and 03/24/2018  (b) opted against adjuvant antiestrogens  (2) status post total abdominal hysterectomy and bilateral salpingo-oophorectomy 06/11/2013 for a pT1a pN0 invasive endometrioid carcinoma, FIGO grade 1, a total of 3 left and 10 right pelvic lymph nodes all clear  (3) status post right breast upper outer quadrant biopsy 03/14/2018 for a clinical T1c N0 invasive ductal carcinoma, grade 2, strongly estrogen and progesterone receptor positive, HER-2 nonamplified, with an MIB-1 of 10%.  (4) status post right lumpectomy 06/12/2018 for a pT1c pNX invasive ductal carcinoma, grade 2, with the final resection margins negative   (5) anastrozole started 04/01/2018  (a) bone density 12/04/2018 shows a T score of -2.0  (6) opted against adjuvant radiation  PLAN:  Nalin is now 4 years out from definitive surgery for her second or recurrent right-sided breast cancer.  She is tolerating anastrozole well and the plan is to continue that a total of 5 years. Mammogram May 2023 with no evidence of malignancy.  She is due for 1.  No change in physical exam, she has postop seroma on the right side which is chronic with no significant change. I have also requested another bone density scan last, last bone density in July 2023, this will be faxed to [facility].  We did discuss that she has osteoporosis on her most recent bone density however she is reluctant to take any medications.  She understands that the anastrozole can worsen the osteoporosis. In the meantime she was encouraged to  take calcium/vitamin D supplementation and consider weightbearing exercises. She will return to clinic in March 2025 when she will be done with 5 years of antiestrogen therapy.  After that she can return to clinic as needed or once a year in the survivorship clinic.  Michelle Moulds, MD   07/21/2022 1:54 PM Medical Oncology and Hematology [facility] [address] [place], [place] [address] Tel. [phone]    Fax. [phone]    *Total Encounter Time as defined by the Centers for Medicare and Medicaid Services includes, in addition to the face-to-face time of a patient visit (documented in the note above) non-face-to-face time: obtaining and reviewing outside history, ordering and reviewing medications, tests or procedures, care coordination (communications with other health care professionals or caregivers) and documentation in the medical record.

## 2022-07-23 DIAGNOSIS — G4733 Obstructive sleep apnea (adult) (pediatric): Secondary | ICD-10-CM | POA: Diagnosis not present

## 2022-07-28 ENCOUNTER — Encounter: Payer: Self-pay | Admitting: Physician Assistant

## 2022-07-28 ENCOUNTER — Ambulatory Visit: Payer: Medicare Other | Admitting: Physician Assistant

## 2022-07-28 VITALS — BP 137/61 | HR 66 | Resp 18 | Ht 67.0 in

## 2022-07-28 DIAGNOSIS — I10 Essential (primary) hypertension: Secondary | ICD-10-CM

## 2022-07-28 DIAGNOSIS — I999 Unspecified disorder of circulatory system: Secondary | ICD-10-CM | POA: Diagnosis not present

## 2022-07-28 DIAGNOSIS — F0670 Mild neurocognitive disorder due to known physiological condition without behavioral disturbance: Secondary | ICD-10-CM

## 2022-07-28 MED ORDER — MEMANTINE HCL 10 MG PO TABS: 10.0000 mg | ORAL_TABLET | Freq: Two times a day (BID) | ORAL | 3 refills | Status: DC

## 2022-07-28 NOTE — Progress Notes (Addendum)
Assessment/Plan:   Mild Cognitive Impairment likely of vascular etiology without behavioral disturbance  Michelle [name] is a very pleasant 89 y.o. RH female with a history of hypertension, hyperlipidemia, AAA, osteoporosis, h/o CVA 2009 with residual memory loss, R breast Cancer on anastrozole and h/o Endometrial Ca, Iron deficiency Anemia, COPD and a diagnosis of mild cognitive impairment likely of vascular etiology, with findings not suggestive of Alzheimer's disease at this time. She presents today in follow-up for evaluation of memory loss. Patient is on memantine 5 mg bid.  MMSE today is 30/30, however, family notice some cognitive changes in her. We discussed increasing socialization, and using the CPAP for OSA which could contribute to improvement of her cognitive status and mood.      Recommendations:   Follow up in  6 months. Increase memantine to 10 mg twice daily Recommend increasing social interaction Repeat Neuropsych testing  around 05/2023 for clarity of diagnosis and disease trajectory   Follow-up COPD with her PCP Recommend good control of cardiovascular risk factors Continue to control mood as per PCP    Subjective:   This patient is accompanied in the office by her daughter Michelle [name].   Any changes in memory since last visit? " Not good". Daughter agrees. She writes a calendar not to forget appointments.  She forgets some names but not often.  She uses a Forensic scientist or goes through the alphabet to recall names, places and things.  She likes to do Bible word search and watch the news "which affect her mood".  She admits not engaging in social activities "she did painting for a while and stopped".  repeats oneself?  Endorsed, especially  with questions.  Disoriented when walking into a room?  Patient denies   Leaving objects in unusual places?  Patient denies   Wandering behavior?   denies   Any personality changes since last visit? "Every once in a while she gets  angry" Any worsening depression?: denies   Hallucinations or paranoia?  denies   Seizures?   denies    Any sleep changes? Does not sleep well, sometimes she may need Tylenol PM . Does not sleep very well because of CPAP. Denies vivid dreams, REM behavior or sleepwalking   Sleep apnea? Endorsed. Does not like the CPAP, but uses it either way Any hygiene concerns?   denies   Independent of bathing and dressing?  Endorsed  Does the patient needs help with medications? Patient is in charge.   Who is in charge of the finances? Patient is in charge     Any changes in appetite?  denies.  She eats well.  Family is concerned because the other day went to a [place] food and she mentioned that she has not eaten it for a while (she did so very recently)  Patient have trouble swallowing?  denies   Does the patient cook? No    Any headaches?    denies   Vision changes? denies Chronic pain?  denies   Ambulates with difficulty? R foot pain due metatarsal presumed hairline fracture requiring a boot, and leg lymphedema limits mobility  Recent falls or head injuries?    denies      Unilateral weakness, numbness or tingling?   denies   Any tremors?  denies   Any anosmia?    denies   Any incontinence of urine?  denies   Any bowel dysfunction?  denies      Patient lives with her daughter Michelle [name]  Does the patient drive? No longer drives      Initial evaluation 08/27/2020 the patient is seen in neurologic consultation at the request of Agapito Games, * for the evaluation of memory.  The patient is accompanied by daughter Michelle [name] who supplements the history.  Her other daughter Michelle [name] is on the phone with [place].  The patient is an 89 y.o. year old female who has had memory issues since her stroke in 2009.  However, the symptoms became more noticeable after a day sudden death of her husband to massive heart attack in December 2021.  This caused significant emotional stress on the patient and she reports "now I  am fine mainly coming out of it, but it was a rough 6 or 7 months ".  At that time, she could not sleep more than 3 hours a night, and was losing the confidence of being on my own, increasingly anxious, and having less patience ".  She moved a couple of months ago to be with her daughter, and now she is beginning to increase her activity, and going out of the house more often, although she feels that the memory loss "is still there ". She has been seen by her PCP, who suspects mild cognitive impairment.  She is in no medications at this time.  She has never been seen by a neurologist until now. Her daughter reports that she repeats the same questions, also tells the same stories for at least a year. Denies hallucinations or paranoia.  She denies acting out in her dreams, or sleepwalking.  Denies issues with bathing or dressing.  Denies missing any medications.  She does her own finances and denies missing any meals.  She denies leaving objects in unusual places.  Her appetite is good, denies trouble swallowing.  She cooks, and denies leaving the stove or the faucet on.  She used to drive until recently, but "the daughter took the keys of the car because she is afraid I am going to get into an accident " Michelle [name] states that there was one episode where she did not recall how to get to the hair salon despite having known this place for a long time, in which the patient interjects "well I just moved to a different place and I am trying to learn my way around, and the roads changed ".  She ambulates without difficulty without a walker or a cane, and denies any recent falls, or trauma to the head. She denies any double vision, dizziness, focal numbness or tingling, unilateral weakness or tremors.  She denies anosmia.  Denies a history of sleep apnea, alcohol.  She quit in 1998 the use of tobacco.  Family history remarkable for maternal grandfather with dementia. She has 12th grade education.   Labs 06/23/2020 Vitamin D 25  hydroxy 66.  31, normal CMP normal CBC remarkable for hemoglobin 11.8, hematocrit 34.5 remarkable for mild anemia   MRI of the brain with and without contrast 06/16/2020 for memory loss   1. No reversible finding or evidence of metastatic disease. 2. Moderate to extensive chronic small vessel ischemia with notable progression from 2009. 3. Mild cerebral volume loss since prior without specific pattern.   Neuropsychological evaluation in April 2023, Dr. Milbert Coulter, yielded a diagnosis of mild vascular neurocognitive disorder briefly, results suggested weaknesses in expressive language (e.g., word finding and articulating herself quickly and clearly) and information retrieval. This was evidenced by primary deficits across verbal fluency and confrontation naming tasks, as well as  delayed recall across memory tasks (with retention scores ranging from 0% to 56%). She did exhibit an isolated impairment across a line orientation task; however, this could have been influenced by her history of macular degeneration and other constructional tasks were appropriate. Regarding etiology, the most likely culprit at the present time appears to be vascular in nature (i.e., mild vascular neurocognitive disorder). Past brain scans have revealed severe and certainly age-advanced small vessel disease which has progressed over the years. It also revealed a remote left PICA infarct. Vascular changes to the brain can certainly affect expressive language, as well as create inefficiencies in accessing previously learned information spontaneously. While left PICA infarcts often have greater physical ramifications than cognitive limitations, her pattern of weakness across testing would be reasonable when accompanied with extensive cerebrovascular changes. Overall, this presentation appears most likely.      Past Medical History:  Diagnosis Date   Abdominal aortic aneurysm (AAA) 3.0 cm to 5.0 cm in diameter in female 11/18/2016   Due  for repeat in 12/2019.   Allergic rhinitis 05/26/2020   Aneurysm of infrarenal abdominal aorta 11/19/2013   2.6 cm in 2006 on CT.  CT 10/15 show 3 cm lesion.  [place]  2018 - no change   Arthritis    hips, lower back   Basal cell carcinoma 2011   R back   Bell's palsy 1984   Bilateral cold feet 03/28/2019   Blood transfusion without reported diagnosis 07/2015   Bradycardia    Cataract    Cholecystitis 2004   Chronic constipation 08/14/2018   Clotting disorder    Complication of anesthesia    COPD (chronic obstructive pulmonary disease)    Essential hypertension, benign 09/26/2007   GERD 11/03/2005   Hiatal hernia 2008   History of CVA (cerebrovascular accident) 09/24/2007   left cerebellar infarction, PICA territory   History of endometrial cancer 04/30/2013   uterine   History of GI bleed 07/04/2015   Hyperlipidemia 11/03/2005   LDL goal <100   Interstitial lung disease 03/28/2019   Iron deficiency anemia, unspecified    Macular degeneration    Malignant neoplasm of upper-outer quadrant of right breast in female, estrogen receptor positive 02/21/2011   Mild vascular neurocognitive disorder 05/14/2021   OSA (obstructive sleep apnea), Severe, AHI 61 04/28/2022   Osteoporosis 11/19/2014   DEXA 10/27/14.  Repeat in 2 yr.  High risk to start medication.    Polyp of colon 2008, 2014   Benign   PONV (postoperative nausea and vomiting)    Primary osteoarthritis of right knee 06/22/2015   Shingles 2008   Squamous cell skin cancer, face 03/22/2012   Actively seeing dermatology.   Tendonitis    rt hand   Venous stasis of both lower extremities 08/14/2018   Vocal cord polyp      Past Surgical History:  Procedure Laterality Date   ABDOMINAL HYSTERECTOMY  2005   APPENDECTOMY     BASAL CELL CARCINOMA EXCISION     biospy  10/28/2009   Shave Biospy skin Left hand(Acitinic Keratoses), Right Upper Arm (superficial Basal Cell), Right Upper Back(Superficial Basal Cell), Left Neck ( Solar  Lentigo & Seborrheic Keratoses)   BREAST BIOPSY  02/11/2011   Right Breast Needle Core Biospy - Upper Outer Quadrant; ER/PR 100%, Her-2 Neu neg.; Ki-67 10%   BREAST LUMPECTOMY WITH RADIOACTIVE SEED LOCALIZATION Right 06/12/2018   Procedure: RIGHT BREAST LUMPECTOMY WITH RADIOACTIVE SEED LOCALIZATION;  Surgeon: Emelia Loron, MD;  Location: [facility];  Service:  General;  Laterality: Right;   BREAST MAMMOSITE  03/14/2011   Procedure: MAMMOSITE BREAST;  Surgeon: Emelia Loron, MD;  Location: [facility];  Service: General;  Laterality: Right;   BREAST SURGERY     right breast lumpectomy snbx   CHOLECYSTECTOMY  2004   COLONOSCOPY     COLONOSCOPY W/ POLYPECTOMY     2008, 2014 (benign)   COLONOSCOPY WITH PROPOFOL N/A 07/09/2015   Procedure: COLONOSCOPY WITH PROPOFOL;  Surgeon: Graylin Shiver, MD;  Location: [facility] END[facility]COPY;  Service: Endoscopy;  Laterality: N/A;   DILATION AND CURETTAGE OF UTERUS N/A 04/24/2013   Procedure: DILATATION AND CURETTAGE;  Surgeon: Lesly Dukes, MD;  Location: [facility];  Service: Gynecology;  Laterality: N/A;   ESOPHAGOGASTRODUODENOSCOPY N/A 07/06/2015   Procedure: ESOPHAGOGASTRODUODENOSCOPY (EGD);  Surgeon: Charlott Rakes, MD;  Location: [facility] ENDOSCOPY;  Service: Endoscopy;  Laterality: N/A;   HYSTEROSCOPY N/A 04/24/2013   Procedure: HYSTEROSCOPY;  Surgeon: Lesly Dukes, MD;  Location: [facility];  Service: Gynecology;  Laterality: N/A;   ROBOTIC ASSISTED TOTAL HYSTERECTOMY WITH BILATERAL SALPINGO OOPHERECTOMY Bilateral 06/11/2013   Procedure: ROBOTIC ASSISTED TOTAL HYSTERECTOMY WITH BILATERAL SALPINGO OOPHORECTOMY WITH POSSIBLE STAGING, EPISIOTOMY;  Surgeon: Laurette Schimke, MD;  Location: [facility];  Service: Gynecology;  Laterality: Bilateral;   SQUAMOUS CELL CARCINOMA EXCISION     TEAR DUCT PROBING     TONSILLECTOMY     vocal cord poylps  1969   excision     PREVIOUS MEDICATIONS:   CURRENT MEDICATIONS:  Outpatient Encounter  Medications as of 07/28/2022  Medication Sig   acetaminophen (TYLENOL) 325 MG tablet Take 650 mg by mouth as needed for mild pain.    albuterol (VENTOLIN HFA) 108 (90 Base) MCG/ACT inhaler Inhale 2 puffs into the lungs every 6 (six) hours as needed for wheezing or shortness of breath. (Patient taking differently: Inhale 2 puffs into the lungs every 6 (six) hours as needed for wheezing or shortness of breath. Pt would like this to be sent to [facility] and when she request this.)   AMBULATORY NON FORMULARY MEDICATION Medication Name: Set CPAP to 8 mm HG water pressure. Please send down load after 4 weeks of use to [phone].   anastrozole (ARIMIDEX) 1 MG tablet TAKE 1 TABLET BY MOUTH DAILY   atorvastatin (LIPITOR) 20 MG tablet TAKE 1 TABLET BY MOUTH EVERY  OTHER AT NIGHT   cholecalciferol (VITAMIN D3) 25 MCG (1000 UT) tablet Take 1,000 Units by mouth daily.   clopidogrel (PLAVIX) 75 MG tablet TAKE 1 TABLET BY MOUTH DAILY   famotidine (PEPCID) 20 MG tablet TAKE 1 TABLET BY MOUTH TWICE  DAILY   fluticasone (FLONASE) 50 MCG/ACT nasal spray Place 2 sprays into both nostrils daily. (Patient taking differently: Place 2 sprays into both nostrils as needed for allergies.)   losartan (COZAAR) 25 MG tablet TAKE 1 TABLET BY MOUTH DAILY   Multiple Vitamins-Minerals (ICAPS AREDS 2 PO) Take 1 capsule by mouth 2 (two) times daily at 10 AM and 5 PM. Morning and evening   VIACTIV 500-500-40 MG-UNT-MCG CHEW CHEW 1 TABLET BY MOUTH EVERY MORNING.   [DISCONTINUED] memantine (NAMENDA) 5 MG tablet TAKE 1 TABLET BY MOUTH TWICE  DAILY   memantine (NAMENDA) 10 MG tablet Take 1 tablet (10 mg total) by mouth 2 (two) times daily.   No facility-administered encounter medications on file as of 07/28/2022.     Objective:     PHYSICAL EXAMINATION:    VITALS:   Vitals:   07/28/22 1252  BP: 137/61  Pulse: 66  Resp: 18  SpO2: 95%  Height: 5\' 7"  (1.702 m)    GEN:  The patient appears stated age and is in NAD. HEENT:   Normocephalic, atraumatic.   Neurological examination:  General: NAD, well-groomed, appears stated age. Orientation: The patient is alert. Oriented to person, place and date Cranial nerves: There is good facial symmetry.The speech is fluent and clear. No aphasia or dysarthria. Fund of knowledge is appropriate. Recent memory impaired and remote memory is normal.  Attention and concentration are normal.  Able to name objects and repeat phrases.  Hearing is intact to conversational tone.  Delayed recall 3 out of 3.  Sensation: Sensation is intact to light touch throughout Motor: Strength is at least antigravity x4. DTR's 2/4 in UE/LE      08/27/2020   10:00 AM  Montreal Cognitive Assessment   Visuospatial/ Executive (0/5) 4  Naming (0/3) 3  Attention: Read list of digits (0/2) 2  Attention: Read list of letters (0/1) 1  Attention: Serial 7 subtraction starting at 100 (0/3) 2  Language: Repeat phrase (0/2) 2  Language : Fluency (0/1) 0  Abstraction (0/2) 2  Delayed Recall (0/5) 1  Orientation (0/6) 6  Total 23  Adjusted Score (based on education) 24       07/28/2022    2:00 PM 11/26/2019    2:47 PM 08/14/2018    4:00 PM  MMSE - Mini Mental State Exam  Orientation to time 5 4 4   Orientation to Place 5 5 5   Registration 3 3 3   Attention/ Calculation 5 5 5   Recall 3 3 3   Language- name 2 objects 2 2 2   Language- repeat 1 1 1   Language- follow 3 step command 3 3 3   Language- read & follow direction 1 1 1   Write a sentence 1 1 1   Copy design 1 1 1   Total score 30 29 29        Movement examination: Tone: There is normal tone in the UE/LE Abnormal movements:  no tremor.  No myoclonus.  No asterixis.   Coordination:  There is no decremation with RAM's. Normal finger to nose  Gait and Station: The patient has no difficulty arising out of a deep-seated chair without the use of the hands. The patient's stride length is good.  Gait is cautious and narrow.  She wears a boot on the  right.  Thank you for allowing [place] the opportunity to participate in the care of this nice patient. Please do not hesitate to contact us for any questions or concerns.   Total time spent on today's visit was 36 minutes dedicated to this patient today, preparing to see patient, examining the patient, ordering tests and/or medications and counseling the patient, documenting clinical information in the EHR or other health record, independently interpreting results and communicating results to the patient/family, discussing treatment and goals, answering patient's questions and coordinating care.  Cc:  Agapito Games, MD  Marlowe Kays 07/28/2022 2:39 PM

## 2022-07-28 NOTE — Patient Instructions (Addendum)
It was a pleasure to see you today at our office.   Recommendations:  Follow up in 6 months   Increase memantine to 10 mg mg two times a day   Government social research officer Activities   Repeat neuropsychological evaluation sometime next yeear    RECOMMENDATIONS FOR ALL PATIENTS WITH MEMORY PROBLEMS:  1. Continue to exercise (Recommend 30 minutes of walking everyday, or 3 hours every week) 2. Increase social interactions - continue going to [place] and enjoy social gatherings with friends and family 3. Eat healthy, avoid fried foods and eat more fruits and vegetables 4. Maintain adequate blood pressure, blood sugar, and blood cholesterol level. Reducing the risk of stroke and cardiovascular disease also helps promoting better memory. 5. Avoid stressful situations. Live a simple life and avoid aggravations. Organize your time and prepare for the next day in anticipation. 6. Sleep well, avoid any interruptions of sleep and avoid any distractions in the bedroom that may interfere with adequate sleep quality 7. Avoid sugar, avoid sweets as there is a strong link between excessive sugar intake, diabetes, and cognitive impairment We discussed the Mediterranean diet, which has been shown to help patients reduce the risk of progressive memory disorders and reduces cardiovascular risk. This includes eating fish, eat fruits and green leafy vegetables, nuts like almonds and hazelnuts, walnuts, and also use olive oil. Avoid fast foods and fried foods as much as possible. Avoid sweets and sugar as sugar use has been linked to worsening of memory function.  There is always a concern of gradual progression of memory problems. If this is the case, then we may need to adjust level of care according to patient needs. Support, both to the patient and caregiver, should then be put into place.    FALL PRECAUTIONS: Be cautious when walking. Scan the area for obstacles that may increase the risk of trips and  falls. When getting up in the mornings, sit up at the edge of the bed for a few minutes before getting out of bed. Consider elevating the bed at the head end to avoid drop of blood pressure when getting up. Walk always in a well-lit room (use night lights in the walls). Avoid area rugs or power cords from appliances in the middle of the walkways. Use a walker or a cane if necessary and consider physical therapy for balance exercise. Get your eyesight checked regularly.  FINANCIAL OVERSIGHT: Supervision, especially oversight when making financial decisions or transactions is also recommended.  HOME SAFETY: Consider the safety of the kitchen when operating appliances like stoves, microwave oven, and blender. Consider having supervision and share cooking responsibilities until no longer able to participate in those. Accidents with firearms and other hazards in the house should be identified and addressed as well.   ABILITY TO BE LEFT ALONE: If patient is unable to contact 911 operator, consider using LifeLine, or when the need is there, arrange for someone to stay with patients. Smoking is a fire hazard, consider supervision or cessation. Risk of wandering should be assessed by caregiver and if detected at any point, supervision and safe proof recommendations should be instituted.  MEDICATION SUPERVISION: Inability to self-administer medication needs to be constantly addressed. Implement a mechanism to ensure safe administration of the medications.       Mediterranean Diet A Mediterranean diet refers to food and lifestyle choices that are based on the traditions of countries located on the [employer]. This way of eating has been shown to help prevent  certain conditions and improve outcomes for people who have chronic diseases, like kidney disease and heart disease. What are tips for following this plan? Lifestyle  Cook and eat meals together with your family, when possible. Drink enough fluid  to keep your urine clear or pale yellow. Be physically active every day. This includes: Aerobic exercise like running or swimming. Leisure activities like gardening, walking, or housework. Get 7-8 hours of sleep each night. If recommended by your health care provider, drink red wine in moderation. This means 1 glass a day for nonpregnant women and 2 glasses a day for men. A glass of wine equals 5 oz (150 mL). Reading food labels  Check the serving size of packaged foods. For foods such as rice and pasta, the serving size refers to the amount of cooked product, not dry. Check the total fat in packaged foods. Avoid foods that have saturated fat or trans fats. Check the ingredients list for added sugars, such as corn syrup. Shopping  At the grocery store, buy most of your food from the areas near the walls of the store. This includes: Fresh fruits and vegetables (produce). Grains, beans, nuts, and seeds. Some of these may be available in unpackaged forms or large amounts (in bulk). Fresh seafood. Poultry and eggs. Low-fat dairy products. Buy whole ingredients instead of prepackaged foods. Buy fresh fruits and vegetables in-season from local farmers markets. Buy frozen fruits and vegetables in resealable bags. If you do not have access to quality fresh seafood, buy precooked frozen shrimp or canned fish, such as tuna, salmon, or sardines. Buy small amounts of raw or cooked vegetables, salads, or olives from the deli or salad bar at your store. Stock your pantry so you always have certain foods on hand, such as olive oil, canned tuna, canned tomatoes, rice, pasta, and beans. Cooking  Cook foods with extra-virgin olive oil instead of using butter or other vegetable oils. Have meat as a side dish, and have vegetables or grains as your main dish. This means having meat in small portions or adding small amounts of meat to foods like pasta or stew. Use beans or vegetables instead of meat in common  dishes like chili or lasagna. Experiment with different cooking methods. Try roasting or broiling vegetables instead of steaming or sauteing them. Add frozen vegetables to soups, stews, pasta, or rice. Add nuts or seeds for added healthy fat at each meal. You can add these to yogurt, salads, or vegetable dishes. Marinate fish or vegetables using olive oil, lemon juice, garlic, and fresh herbs. Meal planning  Plan to eat 1 vegetarian meal one day each week. Try to work up to 2 vegetarian meals, if possible. Eat seafood 2 or more times a week. Have healthy snacks readily available, such as: Vegetable sticks with hummus. Greek yogurt. Fruit and nut trail mix. Eat balanced meals throughout the week. This includes: Fruit: 2-3 servings a day Vegetables: 4-5 servings a day Low-fat dairy: 2 servings a day Fish, poultry, or lean meat: 1 serving a day Beans and legumes: 2 or more servings a week Nuts and seeds: 1-2 servings a day Whole grains: 6-8 servings a day Extra-virgin olive oil: 3-4 servings a day Limit red meat and sweets to only a few servings a month What are my food choices? Mediterranean diet Recommended Grains: Whole-grain pasta. Brown rice. Bulgar wheat. Polenta. Couscous. Whole-wheat bread. Orpah Cobb. Vegetables: Artichokes. Beets. Broccoli. Cabbage. Carrots. Eggplant. Green beans. Chard. Kale. Spinach. Onions. Leeks. Peas. Squash. Tomatoes. Peppers.  Radishes. Fruits: Apples. Apricots. Avocado. Berries. Bananas. Cherries. Dates. Figs. Grapes. Lemons. Melon. Oranges. Peaches. Plums. Pomegranate. Meats and other protein foods: Beans. Almonds. Sunflower seeds. Pine nuts. Peanuts. Cod. Salmon. Scallops. Shrimp. Tuna. Tilapia. Clams. Oysters. Eggs. Dairy: Low-fat milk. Cheese. Greek yogurt. Beverages: Water. Red wine. Herbal tea. Fats and oils: Extra virgin olive oil. Avocado oil. Grape seed oil. Sweets and desserts: [place] yogurt with honey. Baked apples. Poached pears. Trail  mix. Seasoning and other foods: Basil. Cilantro. Coriander. Cumin. Mint. Parsley. Sage. Rosemary. Tarragon. Garlic. Oregano. Thyme. Pepper. Balsalmic vinegar. Tahini. Hummus. Tomato sauce. Olives. Mushrooms. Limit these Grains: Prepackaged pasta or rice dishes. Prepackaged cereal with added sugar. Vegetables: Deep fried potatoes (french fries). Fruits: Fruit canned in syrup. Meats and other protein foods: Beef. Pork. Lamb. Poultry with skin. Hot dogs. Tomasa Blase. Dairy: Ice cream. Sour cream. Whole milk. Beverages: Juice. Sugar-sweetened soft drinks. Beer. Liquor and spirits. Fats and oils: Butter. Canola oil. Vegetable oil. Beef fat (tallow). Lard. Sweets and desserts: Cookies. Cakes. Pies. Candy. Seasoning and other foods: Mayonnaise. Premade sauces and marinades. The items listed may not be a complete list. Talk with your dietitian about what dietary choices are right for you. Summary The Mediterranean diet includes both food and lifestyle choices. Eat a variety of fresh fruits and vegetables, beans, nuts, seeds, and whole grains. Limit the amount of red meat and sweets that you eat. Talk with your health care provider about whether it is safe for you to drink red wine in moderation. This means 1 glass a day for nonpregnant women and 2 glasses a day for men. A glass of wine equals 5 oz (150 mL). This information is not intended to replace advice given to you by your health care provider. Make sure you discuss any questions you have with your health care provider. Document Released: 09/10/2015 Document Revised: 10/13/2015 Document Reviewed: 09/10/2015 Elsevier Interactive Patient Education  2017 [employer].    Your provider has requested that you have labwork completed today. Please go to [facility] ([phone]) on the second floor of this building before leaving the office today. You do not need to check in. If you are not called within 15 minutes please check with the front desk.

## 2022-08-13 ENCOUNTER — Other Ambulatory Visit: Payer: Self-pay | Admitting: Family Medicine

## 2022-08-16 DIAGNOSIS — Z1231 Encounter for screening mammogram for malignant neoplasm of breast: Secondary | ICD-10-CM | POA: Diagnosis not present

## 2022-08-16 LAB — HM MAMMOGRAPHY

## 2022-08-22 DIAGNOSIS — G4733 Obstructive sleep apnea (adult) (pediatric): Secondary | ICD-10-CM | POA: Diagnosis not present

## 2022-08-25 ENCOUNTER — Encounter: Payer: Self-pay | Admitting: Family Medicine

## 2022-08-25 ENCOUNTER — Ambulatory Visit (INDEPENDENT_AMBULATORY_CARE_PROVIDER_SITE_OTHER): Payer: Medicare Other | Admitting: Family Medicine

## 2022-08-25 VITALS — BP 124/71 | HR 83 | Resp 16 | Ht 67.0 in | Wt 178.0 lb

## 2022-08-25 DIAGNOSIS — I10 Essential (primary) hypertension: Secondary | ICD-10-CM | POA: Diagnosis not present

## 2022-08-25 DIAGNOSIS — F0670 Mild neurocognitive disorder due to known physiological condition without behavioral disturbance: Secondary | ICD-10-CM | POA: Diagnosis not present

## 2022-08-25 DIAGNOSIS — I999 Unspecified disorder of circulatory system: Secondary | ICD-10-CM | POA: Diagnosis not present

## 2022-08-25 DIAGNOSIS — G4733 Obstructive sleep apnea (adult) (pediatric): Secondary | ICD-10-CM | POA: Diagnosis not present

## 2022-08-25 DIAGNOSIS — I890 Lymphedema, not elsewhere classified: Secondary | ICD-10-CM

## 2022-08-25 DIAGNOSIS — D582 Other hemoglobinopathies: Secondary | ICD-10-CM

## 2022-08-25 HISTORY — DX: Other hemoglobinopathies: D58.2

## 2022-08-25 HISTORY — DX: Lymphedema, not elsewhere classified: I89.0

## 2022-08-25 NOTE — Assessment & Plan Note (Signed)
He should be set to 8 cm of water pressure but we will call and get a copy of the report to make sure that the AHI is at goal.  If we are able to turn her pressure down just slightly then we can try to do so.  Do think she is getting better and more consistent wearing it but still struggles in the early morning hours to keep it on.  Did discuss the need to wear it as well if she is taking a nap during the day.

## 2022-08-25 NOTE — Assessment & Plan Note (Signed)
We did discuss potential referral to the lymphedema clinic and wearing compression stockings.  She will think about it and let me know.  But if she goes and she starts doing the compression therapy she will need to wear the stockings very regularly.  And she does have difficulty getting them on.

## 2022-08-25 NOTE — Assessment & Plan Note (Addendum)
Pressure is at goal today.  Due for updated labs next month.  But she would rather go ahead and get them done today instead of coming back.

## 2022-08-25 NOTE — Assessment & Plan Note (Signed)
Due to recheck hemoglobin levels.

## 2022-08-25 NOTE — Progress Notes (Signed)
Established Patient Office Visit  Subjective   Patient ID: Michelle [name], female    DOB: 12/21/1932  Age: 89 y.o. MRN: [id number]  Chief Complaint  Patient presents with   Sleep Apnea    HPI  F/U OSA -her CPAP should be set to 8 cm water pressure but were not sure.  She was previously on AutoPap she still really struggles with wearing it at night she makes it through most of the night but then wakes up around 6 AM and has a hard time falling back asleep.  F/U right foot stress fracture -has been wearing the postop shoe for about 6 weeks and says that she is at the point where she really wants to be able to get rid of it.  When she does walk around without it for just short periods of time she has not had any significant pain she does have some chronic swelling.  History of lymphedema.  She has gone to the lymphedema clinic before.      ROS    Objective:     BP 124/71 (BP Location: Left Arm, Patient Position: Sitting, Cuff Size: Large)   Pulse 83   Resp 16   Ht 5\' 7"  (1.702 m)   Wt 178 lb (80.7 kg)   SpO2 98%   BMI 27.88 kg/m     Physical Exam Vitals and nursing note reviewed.  Constitutional:      Appearance: She is well-developed.  HENT:     Head: Normocephalic and atraumatic.  Cardiovascular:     Rate and Rhythm: Normal rate and regular rhythm.     Heart sounds: Normal heart sounds.  Pulmonary:     Effort: Pulmonary effort is normal.     Breath sounds: Normal breath sounds.  Skin:    General: Skin is warm and dry.  Neurological:     Mental Status: She is alert and oriented to person, place, and time.  Psychiatric:        Behavior: Behavior normal.      No results found for any visits on 08/25/22.     The ASCVD Risk score (Arnett DK, et al., 2019) failed to calculate for the following reasons:   The 2019 ASCVD risk score is only valid for ages 40 to 14   The patient has a prior MI or stroke diagnosis    Assessment & Plan:   Problem List Items  Addressed This Visit       Cardiovascular and Mediastinum   Essential hypertension, benign    Pressure is at goal today.  Due for updated labs next month.  But she would rather go ahead and get them done today instead of coming back.      Relevant Orders   CBC   CMP14+EGFR     Respiratory   OSA (obstructive sleep apnea), Severe, AHI 61 - Primary    He should be set to 8 cm of water pressure but we will call and get a copy of the report to make sure that the AHI is at goal.  If we are able to turn her pressure down just slightly then we can try to do so.  Do think she is getting better and more consistent wearing it but still struggles in the early morning hours to keep it on.  Did discuss the need to wear it as well if she is taking a nap during the day.        Nervous and Auditory  Mild vascular neurocognitive disorder    Currently on Namenda and follows with Durenda Age at [facility] neurology.  Continue statin every other day.        Other   Lymphedema of both lower extremities    We did discuss potential referral to the lymphedema clinic and wearing compression stockings.  She will think about it and let me know.  But if she goes and she starts doing the compression therapy she will need to wear the stockings very regularly.  And she does have difficulty getting them on.      Abnormal hemoglobin (HCC)    Due to recheck hemoglobin levels.      Relevant Orders   CBC   CMP14+EGFR    Return in about 4 months (around 12/26/2022) for sleep apnea and BP .   I spent 25 minutes on the day of the encounter to include pre-visit record review, face-to-face time with the patient and post visit ordering of test.  Nani Gasser, MD

## 2022-08-25 NOTE — Assessment & Plan Note (Addendum)
Currently on Namenda and follows with Durenda Age at [facility] neurology.  Continue statin every other day.

## 2022-08-26 LAB — CMP14+EGFR: Potassium: 4.4 mmol/L (ref 3.5–5.2)

## 2022-08-26 NOTE — Progress Notes (Signed)
Hi Jamilya, no anemia and no sign of infection.  Kidney function is stable at 0.9.  Liver enzymes look great.  No concerning findings.

## 2022-08-31 ENCOUNTER — Telehealth: Payer: Self-pay | Admitting: Family Medicine

## 2022-08-31 DIAGNOSIS — G4733 Obstructive sleep apnea (adult) (pediatric): Secondary | ICD-10-CM

## 2022-08-31 MED ORDER — AMBULATORY NON FORMULARY MEDICATION
99 refills | Status: AC
Start: 2022-08-31 — End: ?

## 2022-08-31 NOTE — Telephone Encounter (Signed)
Call her daughter [name] and let her know that she is actually had really great compliance with the CPAP machine.  She is worn it for greater than 4 hours 100% of the time.  Current pressure set between 5 and 10 cm pressure.  And right now around 9 is the pressure that is correcting her sleep apnea.  She was having a fair amount of leaks particularly in April and May but they have actually gotten tremendously better for June and July so it does look like overall she is getting used to it and wearing it more comfortably and having fewer leaks.  AHI is still a little above goal.  Some gena have them set her pressure to 11 for the max.   These call DME supplier for her CPAP and have them adjust the CPAP settings to between 8 and 11 cm of water pressure.  She is currently set between 5 and 10.

## 2022-08-31 NOTE — Telephone Encounter (Signed)
Pt's daughter advised. Will send over new pressure settings.

## 2022-09-01 ENCOUNTER — Encounter: Payer: Self-pay | Admitting: Family Medicine

## 2022-09-06 DIAGNOSIS — G4733 Obstructive sleep apnea (adult) (pediatric): Secondary | ICD-10-CM | POA: Diagnosis not present

## 2022-09-11 ENCOUNTER — Other Ambulatory Visit: Payer: Self-pay | Admitting: Family Medicine

## 2022-09-22 DIAGNOSIS — G4733 Obstructive sleep apnea (adult) (pediatric): Secondary | ICD-10-CM | POA: Diagnosis not present

## 2022-09-26 ENCOUNTER — Telehealth: Payer: Self-pay

## 2022-09-26 NOTE — Patient Outreach (Signed)
  Care Coordination   09/26/2022 Name: ANGE LABLANC MRN: [id number] DOB: 05/20/1932   Care Coordination Outreach Attempts:  An unsuccessful telephone outreach was attempted today to offer the patient information about available care coordination services.  Follow Up Plan:  Additional outreach attempts will be made to offer the patient care coordination information and services.   Encounter Outcome:  No Answer   Care Coordination Interventions:  No, not indicated    Kathyrn Sheriff, RN, MSN, BSN, CCM Care Management Coordinator [phone]

## 2022-10-11 ENCOUNTER — Ambulatory Visit: Payer: Medicare Other | Admitting: Family Medicine

## 2022-10-12 ENCOUNTER — Ambulatory Visit (INDEPENDENT_AMBULATORY_CARE_PROVIDER_SITE_OTHER): Payer: Medicare Other | Admitting: Family Medicine

## 2022-10-12 ENCOUNTER — Encounter: Payer: Self-pay | Admitting: Family Medicine

## 2022-10-12 VITALS — BP 137/65 | HR 66 | Ht 67.0 in | Wt 178.0 lb

## 2022-10-12 DIAGNOSIS — D692 Other nonthrombocytopenic purpura: Secondary | ICD-10-CM | POA: Diagnosis not present

## 2022-10-12 DIAGNOSIS — J849 Interstitial pulmonary disease, unspecified: Secondary | ICD-10-CM

## 2022-10-12 DIAGNOSIS — R0989 Other specified symptoms and signs involving the circulatory and respiratory systems: Secondary | ICD-10-CM

## 2022-10-12 DIAGNOSIS — R0981 Nasal congestion: Secondary | ICD-10-CM

## 2022-10-12 HISTORY — DX: Other nonthrombocytopenic purpura: D69.2

## 2022-10-12 LAB — POC COVID19 BINAXNOW: SARS Coronavirus 2 Ag: NEGATIVE

## 2022-10-12 NOTE — Progress Notes (Signed)
   Acute Office Visit  Subjective:     Patient ID: Michelle [name], female    DOB: 07-22-1932, 89 y.o.   MRN: [id number]  Chief Complaint  Patient presents with   Cough   Nasal Congestion    HPI Patient is in today for dry cough on and off for a couple of weeks. Monday  started getting a runny nose all day long. Then yesterday had a nosebleed. Today feels more congestion on the left nostril. Tested for COVID yesterday neg. No fever or chills.    Also wanted to ask about the bruising on her forearms.   ROS      Objective:    BP 137/65   Pulse 66   Ht 5\' 7"  (1.702 m)   Wt 178 lb (80.7 kg)   SpO2 97%   BMI 27.88 kg/m    Physical Exam Vitals and nursing note reviewed.  Constitutional:      Appearance: Normal appearance.  HENT:     Head: Normocephalic and atraumatic.     Right Ear: Tympanic membrane, ear canal and external ear normal. There is no impacted cerumen.     Left Ear: Tympanic membrane, ear canal and external ear normal. There is no impacted cerumen.     Nose: Nose normal.     Mouth/Throat:     Pharynx: Oropharynx is clear.  Eyes:     Conjunctiva/sclera: Conjunctivae normal.  Cardiovascular:     Rate and Rhythm: Normal rate and regular rhythm.  Pulmonary:     Effort: Pulmonary effort is normal.     Breath sounds: Normal breath sounds.  Musculoskeletal:     Cervical back: Neck supple. No tenderness.  Lymphadenopathy:     Cervical: No cervical adenopathy.  Skin:    General: Skin is warm and dry.  Neurological:     Mental Status: She is alert and oriented to person, place, and time.  Psychiatric:        Mood and Affect: Mood normal.     Results for orders placed or performed in visit on 10/12/22  POC COVID-19  Result Value Ref Range   SARS Coronavirus 2 Ag Negative Negative        Assessment & Plan:   Problem List Items Addressed This Visit       Cardiovascular and Mediastinum   Senile purpura (HCC)    Benign, worsened by blood thinners.          Respiratory   Interstitial lung disease    Lungs clear today. If cough not ipmroving will workup further with chest xray.       Other Visit Diagnoses     Nasal congestion    -  Primary   Relevant Orders   POC COVID-19 (Completed)   Runny nose          Rhinorrhea/nosebleed - discussed triggers.  Negative for COVID. Rec retest tomorrow and is still neg likely allergies.  Recommend trial of Claritin daily ot see if helps with sxs.    Nosebleed - no recurrent. Likely from blowing nose so frequently the day before.     No orders of the defined types were placed in this encounter.   No follow-ups on file.  Nani Gasser, MD

## 2022-10-12 NOTE — Assessment & Plan Note (Signed)
Lungs clear today. If cough not ipmroving will workup further with chest xray.

## 2022-10-12 NOTE — Assessment & Plan Note (Signed)
Benign, worsened by blood thinners.

## 2022-10-12 NOTE — Patient Instructions (Signed)
Claritin daily

## 2022-10-14 ENCOUNTER — Telehealth: Payer: Self-pay

## 2022-10-14 ENCOUNTER — Encounter: Payer: Self-pay | Admitting: Family Medicine

## 2022-10-14 NOTE — Patient Outreach (Signed)
Care Coordination   Care Coordination  Visit Note   10/14/2022 Name: Michelle [name] MRN: [id number] DOB: 1933/01/27  Michelle [name] is a 89 y.o. year old female who sees Metheney, Barbarann Ehlers, MD for primary care. I spoke with patient's daughter, daughter reports patient is on speaker phone by phone today.  What matters to the patients health and wellness today?  RNCM discussed care coordination program. Daughter reports patient is on the speaker phone and declines to participate.    Goals Addressed             This Visit's Progress    COMPLETED: Care Coordination Activities       Interventions Today    Flowsheet Row Most Recent Value  General Interventions   General Interventions Discussed/Reviewed General Interventions Discussed  [care coordination program discussed.]            SDOH assessments and interventions completed:  No  Care Coordination Interventions:  Yes, provided   Follow up plan: No further intervention required.   Encounter Outcome:  Patient Visit Completed   Kathyrn Sheriff, RN, MSN, BSN, CCM Care Management Coordinator [phone]

## 2022-11-01 ENCOUNTER — Encounter: Payer: Self-pay | Admitting: Family Medicine

## 2022-11-01 ENCOUNTER — Ambulatory Visit: Payer: Medicare Other

## 2022-11-01 ENCOUNTER — Ambulatory Visit (INDEPENDENT_AMBULATORY_CARE_PROVIDER_SITE_OTHER): Payer: Medicare Other | Admitting: Family Medicine

## 2022-11-01 VITALS — BP 129/59 | HR 64 | Ht 67.0 in | Wt 178.0 lb

## 2022-11-01 DIAGNOSIS — M545 Low back pain, unspecified: Secondary | ICD-10-CM

## 2022-11-01 DIAGNOSIS — M533 Sacrococcygeal disorders, not elsewhere classified: Secondary | ICD-10-CM

## 2022-11-01 DIAGNOSIS — Z853 Personal history of malignant neoplasm of breast: Secondary | ICD-10-CM | POA: Diagnosis not present

## 2022-11-01 DIAGNOSIS — M47816 Spondylosis without myelopathy or radiculopathy, lumbar region: Secondary | ICD-10-CM | POA: Diagnosis not present

## 2022-11-01 DIAGNOSIS — M4187 Other forms of scoliosis, lumbosacral region: Secondary | ICD-10-CM | POA: Diagnosis not present

## 2022-11-01 DIAGNOSIS — M48061 Spinal stenosis, lumbar region without neurogenic claudication: Secondary | ICD-10-CM | POA: Diagnosis not present

## 2022-11-01 MED ORDER — DICLOFENAC SODIUM 1 % EX GEL: 2.0000 g | Freq: Four times a day (QID) | CUTANEOUS | 1 refills | Status: DC

## 2022-11-01 NOTE — Progress Notes (Signed)
Acute Office Visit  Subjective:     Patient ID: Michelle [name], female    DOB: 05-02-1932, 89 y.o.   MRN: [id number]  Chief Complaint  Patient presents with   Back Pain    HPI Patient is in today for low back pain.  She says it started about 2 weeks ago she carried some pillows from the porch into the house but it also picked up a very light footstool.  She said she woke up the next day with some low back pain it was basically across her low back.  For several days it was quite painful and it made it difficult for her to move.  She says now it seems to have settled more into her mid to right low back area across from the sacrum.  It is not radiating down into her leg or hip.  She has a little bit more mobility now little less pain.  She has been using some Tylenol PM and Biofreeze and heat seems to help as well.  She notices if she sits too long that actually aggravates it and if she stands too long it aggravates it.  No x-rays in the last 10 years of the lumbar spine.  She does have a history of breast cancer  ROS      Objective:    BP (!) 129/59   Pulse 64   Ht 5\' 7"  (1.702 m)   Wt 178 lb (80.7 kg)   SpO2 97%   BMI 27.88 kg/m    Physical Exam Vitals reviewed.  Constitutional:      Appearance: Normal appearance.  HENT:     Head: Normocephalic.  Pulmonary:     Effort: Pulmonary effort is normal.  Musculoskeletal:     Comments: Lumbar flexion, extension, rotation right left and sidebending.  She is tender over both SI joints most tender over the right and a little bit tender over the mid sacral area.  Hip, knee, ankles with normal range of motion.  Patellar reflex 1+ on the right and 0 on the left.  Neurological:     Mental Status: She is alert and oriented to person, place, and time.  Psychiatric:        Mood and Affect: Mood normal.        Behavior: Behavior normal.     No results found for any visits on 11/01/22.      Assessment & Plan:   Problem List Items  Addressed This Visit       Other   History of breast cancer in female - Primary   Other Visit Diagnoses     Acute midline low back pain without sciatica       Relevant Orders   DG Lumbar Spine Complete   DG Sacrum/Coccyx      Onset low back pain in someone with a prior history without back pain and prior history of cancer.  Will go ahead and get plain film x-rays today for additional workup continue with stretches.  Will provide handout with home stretches.  Also we can try Voltaren gel she would like to try that instead of the Biofreeze just to see if it provides little bit more relief she is not able to take oral NSAIDs.  Okay to continue with the Tylenol and heat as needed.  Meds ordered this encounter  Medications   diclofenac Sodium (VOLTAREN) 1 % GEL    Sig: Apply 2 g topically 4 (four) times daily.  Dispense:  100 g    Refill:  1    No follow-ups on file.  Nani Gasser, MD

## 2022-11-08 ENCOUNTER — Encounter: Payer: Self-pay | Admitting: Family Medicine

## 2022-11-09 ENCOUNTER — Other Ambulatory Visit: Payer: Self-pay | Admitting: Family Medicine

## 2022-11-09 DIAGNOSIS — L820 Inflamed seborrheic keratosis: Secondary | ICD-10-CM | POA: Diagnosis not present

## 2022-11-09 DIAGNOSIS — D1801 Hemangioma of skin and subcutaneous tissue: Secondary | ICD-10-CM | POA: Diagnosis not present

## 2022-11-09 DIAGNOSIS — L821 Other seborrheic keratosis: Secondary | ICD-10-CM | POA: Diagnosis not present

## 2022-11-09 DIAGNOSIS — Z85828 Personal history of other malignant neoplasm of skin: Secondary | ICD-10-CM | POA: Diagnosis not present

## 2022-11-09 DIAGNOSIS — L853 Xerosis cutis: Secondary | ICD-10-CM | POA: Diagnosis not present

## 2022-11-09 DIAGNOSIS — L570 Actinic keratosis: Secondary | ICD-10-CM | POA: Diagnosis not present

## 2022-11-09 DIAGNOSIS — I7143 Infrarenal abdominal aortic aneurysm, without rupture: Secondary | ICD-10-CM

## 2022-11-09 NOTE — Telephone Encounter (Signed)
Called and changed order to stat.

## 2022-11-09 NOTE — Telephone Encounter (Signed)
Michelle [name] did we mail these. If not ok to print an dmail low back exercises.

## 2022-11-09 NOTE — Telephone Encounter (Signed)
Michelle [name], can you call imaging and have them try to get that film read today we can change it to stat if needed.

## 2022-11-09 NOTE — Progress Notes (Signed)
Hi Michelle [name] and [place],  YOur xray shows no fracture.  That is great!! You do have some arthritis and disc issues at L1-2 and L2-3.  Would you like to consider PT for your back and see if that helps?  Or we can schedule you with sports med or ortho.  They did note possible enlargement of the aortic aneurysm that you have but because it is a plain film they are not really able to compare apples to apples from the CT that you had back in 2020 so they did mention to consider evaluating it with an ultrasound.  If you feel comfortable with that we can do that if you do not want to do it then I understand.

## 2022-11-11 ENCOUNTER — Encounter: Payer: Self-pay | Admitting: Family Medicine

## 2022-11-29 ENCOUNTER — Ambulatory Visit
Admission: EM | Admit: 2022-11-29 | Discharge: 2022-11-29 | Disposition: A | Discharge status: Home / Self Care | Payer: Medicare Other | Attending: Family Medicine | Admitting: Family Medicine

## 2022-11-29 DIAGNOSIS — R059 Cough, unspecified: Secondary | ICD-10-CM

## 2022-11-29 DIAGNOSIS — J069 Acute upper respiratory infection, unspecified: Secondary | ICD-10-CM

## 2022-11-29 LAB — POC SARS CORONAVIRUS 2 AG -  ED: SARS Coronavirus 2 Ag: NEGATIVE

## 2022-11-29 MED ORDER — PREDNISONE 50 MG PO TABS: ORAL_TABLET | ORAL | 0 refills | Status: DC

## 2022-11-29 MED ORDER — BENZONATATE 200 MG PO CAPS: 200.0000 mg | ORAL_CAPSULE | Freq: Three times a day (TID) | ORAL | 0 refills | Status: AC | PRN

## 2022-11-29 MED ORDER — CEFDINIR 300 MG PO CAPS: 300.0000 mg | ORAL_CAPSULE | Freq: Two times a day (BID) | ORAL | 0 refills | Status: AC

## 2022-11-29 NOTE — Discharge Instructions (Addendum)
Advised patient to take medications as directed with food to completion.  Advised patient to take Prednisone with first dose of Cefdinir for the next 5 of 7 days.  Advised may take Tessalon capsules daily or as needed for cough.  Encouraged to increase daily water intake to 64 ounces per day while taking these medications.  Advised if symptoms worsen and/or unresolved please follow-up with PCP or here for further evaluation.

## 2022-11-29 NOTE — ED Triage Notes (Signed)
Pt c/o cough, congestion and fatigue since Sunday. No OTC meds taken.

## 2022-11-29 NOTE — ED Provider Notes (Signed)
[facility]    CSN: [id number] Arrival date & time: 11/29/22  1435      History   Chief Complaint Chief Complaint  Patient presents with   Cough   Nasal Congestion   Fatigue    HPI Michelle [name] is a 89 y.o. female.   HPI 89 year old female presents with cough, congestion and fatigue for 2-3 days.  PMH significant for [facility], history of CVA and is currently on Plavix (patient denies any unusual bleeding), and COPD.  Patient is accompanied by her daughter this afternoon.  Past Medical History:  Diagnosis Date   Abdominal aortic aneurysm (AAA) 3.0 cm to 5.0 cm in diameter in female 11/18/2016   Due for repeat in 12/2019.   Allergic rhinitis 05/26/2020   Aneurysm of infrarenal abdominal aorta 11/19/2013   2.6 cm in 2006 on CT.  CT 10/15 show 3 cm lesion.  [place]  2018 - no change   Arthritis    hips, lower back   Basal cell carcinoma 2011   R back   Bell's palsy 1984   Bilateral cold feet 03/28/2019   Blood transfusion without reported diagnosis 07/2015   Bradycardia    Cataract    Cholecystitis 2004   Chronic constipation 08/14/2018   Clotting disorder    Complication of anesthesia    COPD (chronic obstructive pulmonary disease)    Essential hypertension, benign 09/26/2007   GERD 11/03/2005   Hiatal hernia 2008   History of CVA (cerebrovascular accident) 09/24/2007   left cerebellar infarction, PICA territory   History of endometrial cancer 04/30/2013   uterine   History of GI bleed 07/04/2015   Hyperlipidemia 11/03/2005   LDL goal <100   Interstitial lung disease 03/28/2019   Iron deficiency anemia, unspecified    Macular degeneration    Malignant neoplasm of upper-outer quadrant of right breast in female, estrogen receptor positive 02/21/2011   Mild vascular neurocognitive disorder 05/14/2021   OSA (obstructive sleep apnea), Severe, AHI 61 04/28/2022   Osteoporosis 11/19/2014   DEXA 10/27/14.  Repeat in 2 yr.  High risk to start medication.    Polyp of  colon 2008, 2014   Benign   PONV (postoperative nausea and vomiting)    Primary osteoarthritis of right knee 06/22/2015   Shingles 2008   Squamous cell skin cancer, face 03/22/2012   Actively seeing dermatology.   Tendonitis    rt hand   Venous stasis of both lower extremities 08/14/2018   Vocal cord polyp     Patient Active Problem List   Diagnosis Date Noted   Senile purpura (HCC) 10/12/2022   Abnormal hemoglobin (HCC) 08/25/2022   Lymphedema of both lower extremities 08/25/2022   OSA (obstructive sleep apnea), Severe, AHI 61 04/28/2022   Genetic testing 09/16/2021   Low serum vitamin B12 08/26/2021   Mild vascular neurocognitive disorder 05/14/2021   Allergic rhinitis 05/26/2020   Interstitial lung disease 03/28/2019   Bilateral cold feet 03/28/2019   Chronic constipation 08/14/2018   Venous stasis of both lower extremities 08/14/2018   Abdominal aortic aneurysm (AAA) 3.0 cm to 5.0 cm in diameter in female 11/18/2016   Iron deficiency anemia, unspecified 07/06/2015   History of GI bleed 07/04/2015   Primary osteoarthritis of right knee 06/22/2015   Osteoporosis 11/19/2014   Aneurysm of infrarenal abdominal aorta (HCC) 11/19/2013   History of endometrial cancer 04/30/2013   Squamous cell skin cancer, face 03/22/2012   Macular degeneration 09/13/2011   History of breast cancer in female  03/17/2011   Malignant neoplasm of upper-outer quadrant of right breast in female, estrogen receptor positive 02/21/2011   Essential hypertension, benign 09/26/2007   History of CVA (cerebrovascular accident) 09/24/2007   Hyperlipidemia LDL goal <100 11/03/2005   GERD 11/03/2005    Past Surgical History:  Procedure Laterality Date   ABDOMINAL HYSTERECTOMY  2005   APPENDECTOMY     BASAL CELL CARCINOMA EXCISION     biospy  10/28/2009   Shave Biospy skin Left hand(Acitinic Keratoses), Right Upper Arm (superficial Basal Cell), Right Upper Back(Superficial Basal Cell), Left Neck ( Solar  Lentigo & Seborrheic Keratoses)   BREAST BIOPSY  02/11/2011   Right Breast Needle Core Biospy - Upper Outer Quadrant; ER/PR 100%, Her-2 Neu neg.; Ki-67 10%   BREAST LUMPECTOMY WITH RADIOACTIVE SEED LOCALIZATION Right 06/12/2018   Procedure: RIGHT BREAST LUMPECTOMY WITH RADIOACTIVE SEED LOCALIZATION;  Surgeon: Emelia Loron, MD;  Location: [facility];  Service: General;  Laterality: Right;   BREAST MAMMOSITE  03/14/2011   Procedure: MAMMOSITE BREAST;  Surgeon: Emelia Loron, MD;  Location: [facility];  Service: General;  Laterality: Right;   BREAST SURGERY     right breast lumpectomy snbx   CHOLECYSTECTOMY  2004   COLONOSCOPY     COLONOSCOPY W/ POLYPECTOMY     2008, 2014 (benign)   COLONOSCOPY WITH PROPOFOL N/A 07/09/2015   Procedure: COLONOSCOPY WITH PROPOFOL;  Surgeon: Graylin Shiver, MD;  Location: [facility];  Service: Endoscopy;  Laterality: N/A;   DILATION AND CURETTAGE OF UTERUS N/A 04/24/2013   Procedure: DILATATION AND CURETTAGE;  Surgeon: Lesly Dukes, MD;  Location: [facility];  Service: Gynecology;  Laterality: N/A;   ESOPHAGOGASTRODUODENOSCOPY N/A 07/06/2015   Procedure: ESOPHAGOGASTRODUODENOSCOPY (EGD);  Surgeon: Charlott Rakes, MD;  Location: [facility];  Service: Endoscopy;  Laterality: N/A;   HYSTEROSCOPY N/A 04/24/2013   Procedure: HYSTEROSCOPY;  Surgeon: Lesly Dukes, MD;  Location: [facility];  Service: Gynecology;  Laterality: N/A;   ROBOTIC ASSISTED TOTAL HYSTERECTOMY WITH BILATERAL SALPINGO OOPHERECTOMY Bilateral 06/11/2013   Procedure: ROBOTIC ASSISTED TOTAL HYSTERECTOMY WITH BILATERAL SALPINGO OOPHORECTOMY WITH POSSIBLE STAGING, EPISIOTOMY;  Surgeon: Laurette Schimke, MD;  Location: [facility];  Service: Gynecology;  Laterality: Bilateral;   SQUAMOUS CELL CARCINOMA EXCISION     TEAR DUCT PROBING     TONSILLECTOMY     vocal cord poylps  1969   excision    OB History     Gravida  2   Para  2   Term  2   Preterm      AB       Living  2      SAB      IAB      Ectopic      Multiple      Live Births               Home Medications    Prior to Admission medications   Medication Sig Start Date End Date Taking? Authorizing Provider  benzonatate (TESSALON) 200 MG capsule Take 1 capsule (200 mg total) by mouth 3 (three) times daily as needed for up to 7 days. 11/29/22 12/06/22 Yes Trevor Iha, FNP  cefdinir (OMNICEF) 300 MG capsule Take 1 capsule (300 mg total) by mouth 2 (two) times daily for 7 days. 11/29/22 12/06/22 Yes Trevor Iha, FNP  predniSONE (DELTASONE) 50 MG tablet Take 1 tab p.o. daily for 5 days. 11/29/22  Yes Trevor Iha, FNP  acetaminophen (TYLENOL) 325 MG tablet Take 650  mg by mouth as needed for mild pain.     [provider]  albuterol (VENTOLIN HFA) 108 (90 Base) MCG/ACT inhaler Inhale 2 puffs into the lungs every 6 (six) hours as needed for wheezing or shortness of breath. Patient taking differently: Inhale 2 puffs into the lungs every 6 (six) hours as needed for wheezing or shortness of breath. Pt would like this to be [facility] to [facility] and when she request this. 04/14/22   Agapito Games, MD  AMBULATORY NON FORMULARY MEDICATION Medication Name: Set CPAP to 8-11 mm HG water pressure. Please send down load after 4 weeks of use to [phone]. 08/31/22   Agapito Games, MD  anastrozole (ARIMIDEX) 1 MG tablet TAKE 1 TABLET BY MOUTH DAILY 02/23/22   Rachel Moulds, MD  atorvastatin (LIPITOR) 20 MG tablet TAKE 1 TABLET BY MOUTH EVERY  OTHER NIGHT 08/15/22   Agapito Games, MD  cholecalciferol (VITAMIN D3) 25 MCG (1000 UT) tablet Take 1,000 Units by mouth daily.    [provider]  clopidogrel (PLAVIX) 75 MG tablet TAKE 1 TABLET BY MOUTH DAILY 09/14/22   Agapito Games, MD  diclofenac Sodium (VOLTAREN) 1 % GEL Apply 2 g topically 4 (four) times daily. 11/01/22   Agapito Games, MD  famotidine (PEPCID) 20 MG tablet TAKE 1 TABLET BY  MOUTH TWICE  DAILY 06/06/22   Agapito Games, MD  fluticasone (FLONASE) 50 MCG/ACT nasal spray Place 2 sprays into both nostrils daily. Patient taking differently: Place 2 sprays into both nostrils as needed for allergies. 04/14/22   Agapito Games, MD  losartan (COZAAR) 25 MG tablet TAKE 1 TABLET BY MOUTH DAILY 08/15/22   Agapito Games, MD  memantine (NAMENDA) 10 MG tablet Take 1 tablet (10 mg total) by mouth 2 (two) times daily. 07/28/22   Marcos Eke, PA-C  Multiple Vitamins-Minerals (ICAPS AREDS 2 PO) Take 1 capsule by mouth 2 (two) times daily at 10 AM and 5 PM. Morning and evening    [provider]  VIACTIV 500-500-40 MG-UNT-MCG CHEW CHEW 1 TABLET BY MOUTH EVERY MORNING. 09/16/14   [provider]    Family History Family History  Problem Relation Age of Onset   Heart disease Father    Cancer Sister 13       Lymphoma   Breast cancer Sister    Cancer Paternal Aunt        Breast cancer   Cancer Daughter 42       Bilateral Breast Cancer   Breast cancer Daughter 58   Cancer Daughter        Breast Cancer   Breast cancer Daughter    Cancer Sister        2005 - Renal Cell Cancer and Breast Cancer   Breast cancer Sister     Social History Social History   Tobacco Use   Smoking status: Former    Current packs/day: 0.00    Average packs/day: 1 pack/day for 30.0 years (30.0 ttl pk-yrs)    Types: Cigarettes    Start date: 01/31/1966    Quit date: 02/01/1996    Years since quitting: 26.8   Smokeless tobacco: Never  Vaping Use   Vaping status: Never Used  Substance Use Topics   Alcohol use: Not Currently   Drug use: No     Allergies   Benadryl [diphenhydramine hcl], Doxycycline, Simvastatin, Fosamax [alendronate sodium], Lisinopril, and Nsaids   Review of Systems Review of Systems  Physical Exam Triage Vital Signs ED Triage Vitals  Encounter Vitals Group     BP 11/29/22 1444 121/71     Systolic BP Percentile --      Diastolic  BP Percentile --      Pulse Rate 11/29/22 1444 86     Resp 11/29/22 1444 17     Temp 11/29/22 1444 98.5 F (36.9 C)     Temp Source 11/29/22 1444 Oral     SpO2 11/29/22 1444 93 %     Weight --      Height --      Head Circumference --      Peak Flow --      Pain Score 11/29/22 1445 0     Pain Loc --      Pain Education --      Exclude from Growth Chart --    No data found.  Updated Vital Signs BP 121/71 (BP Location: Left Arm)   Pulse 86   Temp 98.5 F (36.9 C) (Oral)   Resp 17   SpO2 93%   Visual Acuity Right Eye Distance:   Left Eye Distance:   Bilateral Distance:    Right Eye Near:   Left Eye Near:    Bilateral Near:     Physical Exam Vitals and nursing note reviewed.  Constitutional:      General: She is not in acute distress.    Appearance: Normal appearance. She is obese. She is not ill-appearing.  HENT:     Head: Normocephalic and atraumatic.     Right Ear: Tympanic membrane, ear canal and external ear normal.     Left Ear: Tympanic membrane, ear canal and external ear normal.     Nose:     Comments: Turbinates are erythematous/edematous    Mouth/Throat:     Mouth: Mucous membranes are moist.     Pharynx: Oropharynx is clear.     Comments: Mild to moderate amount of drainage of posterior oropharynx noted Eyes:     Extraocular Movements: Extraocular movements intact.     Conjunctiva/sclera: Conjunctivae normal.     Pupils: Pupils are equal, round, and reactive to light.  Cardiovascular:     Rate and Rhythm: Normal rate and regular rhythm.     Pulses: Normal pulses.     Heart sounds: Normal heart sounds.  Pulmonary:     Effort: Pulmonary effort is normal.     Breath sounds: Normal breath sounds. No wheezing, rhonchi or rales.     Comments: Frequent nonproductive cough noted on exam Musculoskeletal:        General: Normal range of motion.     Cervical back: Normal range of motion and neck supple.  Skin:    General: Skin is warm and dry.   Neurological:     General: No focal deficit present.     Mental Status: She is alert and oriented to person, place, and time. Mental status is at baseline.  Psychiatric:        Mood and Affect: Mood normal.        Behavior: Behavior normal.      UC Treatments / Results  Labs (all labs ordered are listed, but only abnormal results are displayed) Labs Reviewed  POC SARS CORONAVIRUS 2 AG -  ED    EKG   Radiology No results found.  Procedures Procedures (including critical care time)  Medications Ordered in UC Medications - No data to display  Initial Impression / Assessment and Plan /  UC Course  I have reviewed the triage vital signs and the nursing notes.  Pertinent labs & imaging results that were available during my care of the patient were reviewed by me and considered in my medical decision making (see chart for details).     MDM: 1.  Acute upper respiratory infection-Rx'd cefdinir 300 mg capsule: Take 1 capsule twice daily x 7 days; 2.  Cough, unspecified type-Rx'd Prednisone 50 mg tablet daily x 5 days, Rx'd Tessalon 200 mg capsule: Take 1 capsule 3 times daily, as needed for cough. Advised patient to take medications as directed with food to completion.  Advised patient to take Prednisone with first dose of Cefdinir for the next 5 of 7 days.  Advised may take Tessalon capsules daily or as needed for cough.  Encouraged to increase daily water intake to 64 ounces per day while taking these medications.  Advised if symptoms worsen and/or unresolved please follow-up with PCP or here for further evaluation.  Patient discharged home, hemodynamically stable. Final Clinical Impressions(s) / UC Diagnoses   Final diagnoses:  Acute upper respiratory infection  Cough, unspecified type     Discharge Instructions      Advised patient to take medications as directed with food to completion.  Advised patient to take Prednisone with first dose of Cefdinir for the next 5 of 7  days.  Advised may take Tessalon capsules daily or as needed for cough.  Encouraged to increase daily water intake to 64 ounces per day while taking these medications.  Advised if symptoms worsen and/or unresolved please follow-up with PCP or here for further evaluation.     ED Prescriptions     Medication Sig Dispense Auth. Provider   cefdinir (OMNICEF) 300 MG capsule Take 1 capsule (300 mg total) by mouth 2 (two) times daily for 7 days. 14 capsule Trevor Iha, FNP   predniSONE (DELTASONE) 50 MG tablet Take 1 tab p.o. daily for 5 days. 5 tablet Trevor Iha, FNP   benzonatate (TESSALON) 200 MG capsule Take 1 capsule (200 mg total) by mouth 3 (three) times daily as needed for up to 7 days. 40 capsule Trevor Iha, FNP      PDMP not reviewed this encounter.   Trevor Iha, FNP 11/29/22 1550

## 2022-11-30 ENCOUNTER — Ambulatory Visit: Payer: Medicare Other | Admitting: Family Medicine

## 2022-12-05 ENCOUNTER — Telehealth: Payer: Self-pay | Admitting: Family Medicine

## 2022-12-05 NOTE — Telephone Encounter (Signed)
Daughter called. Michelle [name] was seen at [facility] for acute URI She still has stuffy nose and cough.  She was given  antibiotic, prednisone and tesslon pealrs, She has enough antibiotic for tomorrow and she still has a lot left of the tessalon pearls. Please advise.

## 2022-12-06 NOTE — Telephone Encounter (Signed)
Task completed. Spoke to patient's daughter [name]. Agreeable with provider's recommendation. Per daughter, patient is not in need of a refill. Patient's daughter will contact the clinic to schedule an appointment for her mother if her symptoms does not improve. No other inquiries during the call.

## 2022-12-06 NOTE — Telephone Encounter (Signed)
Please call pt and see how she is doing

## 2022-12-06 NOTE — Telephone Encounter (Signed)
We can continue with the Tessalon pearls especially if she feels like she is gradually improving.  But if she feels like she is not improving then we probably need to see her in person.  Data refill Tessalon Perles if needed.  So recommend running a few cool-mist humidifier and keeping throat moist.

## 2022-12-06 NOTE — Telephone Encounter (Signed)
Patient's daughter called in stating the patient is feeling the same nd wanted to know if she could continue the tesslon pearls or is there something she could OTC.

## 2022-12-07 ENCOUNTER — Encounter (INDEPENDENT_AMBULATORY_CARE_PROVIDER_SITE_OTHER): Payer: Medicare Other | Admitting: Ophthalmology

## 2022-12-08 ENCOUNTER — Telehealth: Payer: Self-pay | Admitting: Family Medicine

## 2022-12-08 NOTE — Telephone Encounter (Signed)
Patient called she needs a new prescription  for Anastrozole 1mg  please submit to [facility] ORDER [phone]

## 2022-12-08 NOTE — Telephone Encounter (Signed)
This is not a medication that Dr. Linford Arnold writes for her. She gets this from her Oncologist.  Spoke w/pt's daughter she stated that she would take care of this for her.

## 2022-12-09 ENCOUNTER — Ambulatory Visit (HOSPITAL_COMMUNITY): Payer: Medicare Other

## 2022-12-13 ENCOUNTER — Encounter: Payer: Self-pay | Admitting: *Deleted

## 2022-12-15 ENCOUNTER — Ambulatory Visit (HOSPITAL_COMMUNITY)
Admission: RE | Admit: 2022-12-15 | Discharge: 2022-12-15 | Disposition: A | Discharge status: Home / Self Care | Payer: Medicare Other | Source: Ambulatory Visit | Attending: Family Medicine | Admitting: Family Medicine

## 2022-12-15 DIAGNOSIS — I7143 Infrarenal abdominal aortic aneurysm, without rupture: Secondary | ICD-10-CM | POA: Insufficient documentation

## 2022-12-15 NOTE — Progress Notes (Signed)
Hi Michelle [name], great news, the dilation of the aorta is fairly stable.  3 years ago it was 3.1 cm this time they measured 3.3.  But they did not feel like this was a big change which is fantastic.  Will recommend repeating scan in about 2 years.

## 2022-12-22 ENCOUNTER — Encounter: Payer: Self-pay | Admitting: Family Medicine

## 2022-12-26 ENCOUNTER — Ambulatory Visit (INDEPENDENT_AMBULATORY_CARE_PROVIDER_SITE_OTHER): Payer: Medicare Other | Admitting: Family Medicine

## 2022-12-26 ENCOUNTER — Encounter: Payer: Self-pay | Admitting: Family Medicine

## 2022-12-26 ENCOUNTER — Ambulatory Visit: Payer: Medicare Other

## 2022-12-26 VITALS — BP 118/63 | HR 70 | Ht 67.0 in | Wt 183.0 lb

## 2022-12-26 DIAGNOSIS — I10 Essential (primary) hypertension: Secondary | ICD-10-CM | POA: Diagnosis not present

## 2022-12-26 DIAGNOSIS — R053 Chronic cough: Secondary | ICD-10-CM | POA: Diagnosis not present

## 2022-12-26 DIAGNOSIS — G4733 Obstructive sleep apnea (adult) (pediatric): Secondary | ICD-10-CM | POA: Diagnosis not present

## 2022-12-26 DIAGNOSIS — I714 Abdominal aortic aneurysm, without rupture, unspecified: Secondary | ICD-10-CM | POA: Diagnosis not present

## 2022-12-26 NOTE — Patient Instructions (Signed)
OK to stop the losartan for 3-4 weeks and see if you cough is improving.

## 2022-12-26 NOTE — Assessment & Plan Note (Signed)
Would get an updated download off her CPAP to make sure pressure is adequate.

## 2022-12-26 NOTE — Progress Notes (Signed)
Established Patient Office Visit  Subjective   Patient ID: Michelle [name], female    DOB: [birth year]  Age: 89 y.o. MRN: [id number]  Chief Complaint  Patient presents with   Medical Management of Chronic Issues    HPI F/U - OSA  she is doing much better. Really getting used to it.  Getting 7-8 hours of sleep.  Patiently she will wake up early and then cannot go back to sleep but sometimes she is able to go back to sleep and get some rest.  Hypertension- Pt denies chest pain, SOB, dizziness, or heart palpitations.  Taking meds as directed w/o problems.  Denies medication side effects.    Daughter who is here with her today reports that she does have a chronic cough, she was sick back in October but the cough was there even before that.  It seems to be intermittent throughout the day it is mild.  It is not actually bothersome to the patient herself.  It is mostly dry.  Chest x-ray was in 2020.    ROS    Objective:     BP 118/63   Pulse 70   Ht 5\' 7"  (1.702 m)   Wt 183 lb (83 kg)   SpO2 97%   BMI 28.66 kg/m    Physical Exam Vitals and nursing note reviewed.  Constitutional:      Appearance: Normal appearance.  HENT:     Head: Normocephalic and atraumatic.  Eyes:     Conjunctiva/sclera: Conjunctivae normal.  Cardiovascular:     Rate and Rhythm: Normal rate and regular rhythm.  Pulmonary:     Effort: Pulmonary effort is normal.     Breath sounds: Normal breath sounds.  Skin:    General: Skin is warm and dry.  Neurological:     Mental Status: She is alert.  Psychiatric:        Mood and Affect: Mood normal.      No results found for any visits on 12/26/22.    The ASCVD Risk score (Arnett DK, et al., 2019) failed to calculate for the following reasons:   The 2019 ASCVD risk score is only valid for ages 3 to 54   The patient has a prior MI or stroke diagnosis    Assessment & Plan:   Problem List Items Addressed This Visit       Cardiovascular and  Mediastinum   Essential hypertension, benign    Well controlled. Continue current regimen. Follow up in  70mo       Abdominal aortic aneurysm (AAA) 3.0 cm to 5.0 cm in diameter in female - Primary    Stable on recent recent doppler.  Just reviewed the importance of keeping blood pressures under good control        Respiratory   OSA (obstructive sleep apnea), Severe, AHI 61    Would get an updated download off her CPAP to make sure pressure is adequate.       Other Visit Diagnoses     Chronic cough       Relevant Orders   DG Chest 2 View       Cough-we did discuss that it could be a side effect of the ARB.  Will get plain film chest x-ray today if everything is normal then recommend holding the ARB for 3 to 4 weeks to see if cough starts to improve if it does then we will continue to hold for 2 months.  We can always find another  medication if needed she does have a home blood pressure cuff so was able to check her blood pressure at home over those couple of weeks while she is holding the medication.  Return in about 3 months (around 03/28/2023) for Memory testing/BP check.    Nani Gasser, MD

## 2022-12-26 NOTE — Assessment & Plan Note (Addendum)
Stable on recent recent doppler.  Just reviewed the importance of keeping blood pressures under good control

## 2022-12-26 NOTE — Assessment & Plan Note (Signed)
Well controlled. Continue current regimen. Follow up in  6 mo  

## 2023-01-02 ENCOUNTER — Other Ambulatory Visit: Payer: Self-pay | Admitting: Hematology and Oncology

## 2023-01-02 NOTE — Telephone Encounter (Signed)
Continue Anastrazole until May 2025 per last OV. Lorayne Marek, RN

## 2023-01-09 NOTE — Progress Notes (Signed)
Overall chest x-ray looks great just some prominence of the interstitium.  Very similar to the exam back in 2020 so no major changes no sign of fluid or pneumonia or mass.  They did see what looks like a benign calcification in the left breast overlying the lung base.

## 2023-01-10 ENCOUNTER — Encounter: Payer: Self-pay | Admitting: Physician Assistant

## 2023-01-10 ENCOUNTER — Ambulatory Visit: Payer: Medicare Other | Admitting: Physician Assistant

## 2023-01-10 VITALS — BP 157/78 | HR 70 | Ht 67.0 in | Wt 181.0 lb

## 2023-01-10 DIAGNOSIS — I999 Unspecified disorder of circulatory system: Secondary | ICD-10-CM | POA: Diagnosis not present

## 2023-01-10 DIAGNOSIS — F0670 Mild neurocognitive disorder due to known physiological condition without behavioral disturbance: Secondary | ICD-10-CM | POA: Diagnosis not present

## 2023-01-10 NOTE — Progress Notes (Signed)
Assessment/Plan:   Mild cognitive impairment likely of vascular etiology without behavioral disturbance  Michelle [name] is a very pleasant 89 y.o. RH female with a history of hypertension, hyperlipidemia, AAA, osteoporosis, h/o CVA 2009 with residual memory loss, R breast Cancer on anastrozole and h/o Endometrial Ca, Iron deficiency Anemia, COPD and a diagnosis of mild cognitive impairment likely of vascular etiology, with findings not suggestive of Alzheimer's disease at this time, would suspect any component of vascular etiology, presenting today in follow-up for evaluation of memory loss. Patient is on memantine 10 mg bid. MMSE today 30/30, stable from prior. Patient is able to participate on his ADLs and to drive without difficulties       Recommendations:   Follow up in  6 months. Continue memantine 10 mg twice daily Recommend increasing social interaction Patient is due to repeat neuropsych testing in April 2025 for clarity of diagnosis and disease trajectory Follow-up COPD with her PCP Recommend good control of cardiovascular risk factors Continue to control mood as per PCP/ CPAP    Subjective:   This patient is accompanied in the office by her daughter [name]  who supplements the history. Previous records as well as any outside records available were reviewed prior to todays visit.   Patient was last seen on 07/28/2022 with MMSE 30/30   Any changes in memory since last visit? "About the same".  She writes a calendar not to forget appointments, forgets some names but not often.  She did not remember a surprise party to one of her relatives even after talking about it.  She has trouble with decision making. She goes through the alphabet to recall names, places and things.  She likes to do Bible word searching. She engages in social activities when she can.  She did painting for a while and stopped. repeats oneself?  Endorsed especially with questions Disoriented when walking into a  room?  Patient denies   Misplacing objects?  Patient denies   Wandering behavior?   denies   Any personality changes since last visit?  "Every once in a while she gets angry " Any worsening depression?: denies   Hallucinations or paranoia?  denies   Seizures?   denies    Any sleep changes?  Does not sleep very well, because of the sometimes she needs Tylenol PM.  Denies vivid dreams, REM behavior or sleepwalking   Sleep apnea endorsed, does not like using the CPAP but she does it anyway   Any hygiene concerns?   denies   Independent of bathing and dressing?  Endorsed  Does the patient needs help with medications? Patient is in charge  Who is in charge of the finances?  Patient is in charge    Any changes in appetite?  She eats well.    Patient have trouble swallowing?  denies   Does the patient cook? No Any headaches?    denies   Vision changes? denies Chronic pain?  denies   Ambulates with difficulty?  Denies.    Recent falls or head injuries?    denies .     Unilateral weakness, numbness or tingling?   denies   Any tremors?  denies   Any anosmia?    denies   Any incontinence of urine?  Some incontinence, wears pads  Any bowel dysfunction?  denies      Patient lives with her daughter [name]     Does the patient drive?  No longer drives   Initial evaluation 08/27/2020  the patient is seen in neurologic consultation at the request of [employer], * for the evaluation of memory.  The patient is accompanied by daughter [name] who supplements the history.  Her other daughter [name] is on the phone with [place].  The patient is an 89 y.o. year old female who has had memory issues since her stroke in 2009.  However, the symptoms became more noticeable after a day sudden death of her husband to massive heart attack in December 2021.  This caused significant emotional stress on the patient and she reports "now I am fine mainly coming out of it, but it was a rough 6 or 7 months ".  At that time,  she could not sleep more than 3 hours a night, and was losing the confidence of being on my own, increasingly anxious, and having less patience ".  She moved a couple of months ago to be with her daughter, and now she is beginning to increase her activity, and going out of the house more often, although she feels that the memory loss "is still there ". She has been seen by her PCP, who suspects mild cognitive impairment.  She is in no medications at this time.  She has never been seen by a neurologist until now. Her daughter reports that she repeats the same questions, also tells the same stories for at least a year. Denies hallucinations or paranoia.  She denies acting out in her dreams, or sleepwalking.  Denies issues with bathing or dressing.  Denies missing any medications.  She does her own finances and denies missing any meals.  She denies leaving objects in unusual places.  Her appetite is good, denies trouble swallowing.  She cooks, and denies leaving the stove or the faucet on.  She used to drive until recently, but "the daughter took the keys of the car because she is afraid I am going to get into an accident " [name] states that there was one episode where she did not recall how to get to the hair salon despite having known this place for a long time, in which the patient interjects "well I just moved to a different place and I am trying to learn my way around, and the roads changed ".  She ambulates without difficulty without a walker or a cane, and denies any recent falls, or trauma to the head. She denies any double vision, dizziness, focal numbness or tingling, unilateral weakness or tremors.  She denies anosmia.  Denies a history of sleep apnea, alcohol.  She quit in 1998 the use of tobacco.  Family history remarkable for maternal grandfather with dementia. She has 12th grade education.   Labs 06/23/2020 Vitamin D 25 hydroxy 66.  31, normal CMP normal CBC remarkable for hemoglobin 11.8, hematocrit  34.5 remarkable for mild anemia   MRI of the brain with and without contrast 06/16/2020 for memory loss   1. No reversible finding or evidence of metastatic disease. 2. Moderate to extensive chronic small vessel ischemia with notable progression from 2009. 3. Mild cerebral volume loss since prior without specific pattern.   Neuropsychological evaluation in April 2023, Dr. Milbert Coulter, yielded a diagnosis of mild vascular neurocognitive disorder briefly, results suggested weaknesses in expressive language (e.g., word finding and articulating herself quickly and clearly) and information retrieval. This was evidenced by primary deficits across verbal fluency and confrontation naming tasks, as well as delayed recall across memory tasks (with retention scores ranging from 0% to 56%). She did  exhibit an isolated impairment across a line orientation task; however, this could have been influenced by her history of macular degeneration and other constructional tasks were appropriate. Regarding etiology, the most likely culprit at the present time appears to be vascular in nature (i.e., mild vascular neurocognitive disorder). Past brain scans have revealed severe and certainly age-advanced small vessel disease which has progressed over the years. It also revealed a remote left PICA infarct. Vascular changes to the brain can certainly affect expressive language, as well as create inefficiencies in accessing previously learned information spontaneously. While left PICA infarcts often have greater physical ramifications than cognitive limitations, her pattern of weakness across testing would be reasonable when accompanied with extensive cerebrovascular changes. Overall, this presentation appears most likely.     Past Medical History:  Diagnosis Date   Abdominal aortic aneurysm (AAA) 3.0 cm to 5.0 cm in diameter in female 11/18/2016   Due for repeat in 12/2019.   Allergic rhinitis 05/26/2020   Aneurysm of infrarenal  abdominal aorta 11/19/2013   2.6 cm in 2006 on CT.  CT 10/15 show 3 cm lesion.  [place]  2018 - no change   Arthritis    hips, lower back   Basal cell carcinoma 2011   R back   Bell's palsy 1984   Bilateral cold feet 03/28/2019   Blood transfusion without reported diagnosis 07/2015   Bradycardia    Cataract    Cholecystitis 2004   Chronic constipation 08/14/2018   Clotting disorder    Complication of anesthesia    COPD (chronic obstructive pulmonary disease)    Essential hypertension, benign 09/26/2007   GERD 11/03/2005   Hiatal hernia 2008   History of CVA (cerebrovascular accident) 09/24/2007   left cerebellar infarction, PICA territory   History of endometrial cancer 04/30/2013   uterine   History of GI bleed 07/04/2015   Hyperlipidemia 11/03/2005   LDL goal <100   Interstitial lung disease 03/28/2019   Iron deficiency anemia, unspecified    Macular degeneration    Malignant neoplasm of upper-outer quadrant of right breast in female, estrogen receptor positive 02/21/2011   Mild vascular neurocognitive disorder 05/14/2021   OSA (obstructive sleep apnea), Severe, AHI 61 04/28/2022   Osteoporosis 11/19/2014   DEXA 10/27/14.  Repeat in 2 yr.  High risk to start medication.    Polyp of colon 2008, 2014   Benign   PONV (postoperative nausea and vomiting)    Primary osteoarthritis of right knee 06/22/2015   Shingles 2008   Squamous cell skin cancer, face 03/22/2012   Actively seeing dermatology.   Tendonitis    rt hand   Venous stasis of both lower extremities 08/14/2018   Vocal cord polyp      Past Surgical History:  Procedure Laterality Date   ABDOMINAL HYSTERECTOMY  2005   APPENDECTOMY     BASAL CELL CARCINOMA EXCISION     biospy  10/28/2009   Shave Biospy skin Left hand(Acitinic Keratoses), Right Upper Arm (superficial Basal Cell), Right Upper Back(Superficial Basal Cell), Left Neck ( Solar Lentigo & Seborrheic Keratoses)   BREAST BIOPSY  02/11/2011   Right Breast  Needle Core Biospy - Upper Outer Quadrant; ER/PR 100%, Her-2 Neu neg.; Ki-67 10%   BREAST LUMPECTOMY WITH RADIOACTIVE SEED LOCALIZATION Right 06/12/2018   Procedure: RIGHT BREAST LUMPECTOMY WITH RADIOACTIVE SEED LOCALIZATION;  Surgeon: Emelia Loron, MD;  Location: [facility];  Service: General;  Laterality: Right;   BREAST MAMMOSITE  03/14/2011   Procedure: MAMMOSITE BREAST;  Surgeon: Emelia Loron, MD;  Location: [facility];  Service: General;  Laterality: Right;   BREAST SURGERY     right breast lumpectomy snbx   CHOLECYSTECTOMY  2004   COLONOSCOPY     COLONOSCOPY W/ POLYPECTOMY     2008, 2014 (benign)   COLONOSCOPY WITH PROPOFOL N/A 07/09/2015   Procedure: COLONOSCOPY WITH PROPOFOL;  Surgeon: Graylin Shiver, MD;  Location: [facility] ENDOSCOPY;  Service: Endoscopy;  Laterality: N/A;   DILATION AND CURETTAGE OF UTERUS N/A 04/24/2013   Procedure: DILATATION AND CURETTAGE;  Surgeon: Lesly Dukes, MD;  Location: [facility];  Service: Gynecology;  Laterality: N/A;   ESOPHAGOGASTRODUODENOSCOPY N/A 07/06/2015   Procedure: ESOPHAGOGASTRODUODENOSCOPY (EGD);  Surgeon: Charlott Rakes, MD;  Location: [facility] ENDOSCOPY;  Service: Endoscopy;  Laterality: N/A;   HYSTEROSCOPY N/A 04/24/2013   Procedure: HYSTEROSCOPY;  Surgeon: Lesly Dukes, MD;  Location: [facility];  Service: Gynecology;  Laterality: N/A;   ROBOTIC ASSISTED TOTAL HYSTERECTOMY WITH BILATERAL SALPINGO OOPHERECTOMY Bilateral 06/11/2013   Procedure: ROBOTIC ASSISTED TOTAL HYSTERECTOMY WITH BILATERAL SALPINGO OOPHORECTOMY WITH POSSIBLE STAGING, EPISIOTOMY;  Surgeon: Laurette Schimke, MD;  Location: [facility];  Service: Gynecology;  Laterality: Bilateral;   SQUAMOUS CELL CARCINOMA EXCISION     TEAR DUCT PROBING     TONSILLECTOMY     vocal cord poylps  1969   excision     PREVIOUS MEDICATIONS:   CURRENT MEDICATIONS:  Outpatient Encounter Medications as of 01/10/2023  Medication Sig   acetaminophen (TYLENOL) 325 MG  tablet Take 650 mg by mouth as needed for mild pain.    albuterol (VENTOLIN HFA) 108 (90 Base) MCG/ACT inhaler Inhale 2 puffs into the lungs every 6 (six) hours as needed for wheezing or shortness of breath. (Patient taking differently: Inhale 2 puffs into the lungs every 6 (six) hours as needed for wheezing or shortness of breath. Pt would like this to be sent to [facility] ONLY and when she request this.)   AMBULATORY NON FORMULARY MEDICATION Medication Name: Set CPAP to 8-11 mm HG water pressure. Please send down load after 4 weeks of use to [phone].   anastrozole (ARIMIDEX) 1 MG tablet TAKE 1 TABLET BY MOUTH DAILY   atorvastatin (LIPITOR) 20 MG tablet TAKE 1 TABLET BY MOUTH EVERY  OTHER NIGHT   cholecalciferol (VITAMIN D3) 25 MCG (1000 UT) tablet Take 1,000 Units by mouth daily.   clopidogrel (PLAVIX) 75 MG tablet TAKE 1 TABLET BY MOUTH DAILY   diclofenac Sodium (VOLTAREN) 1 % GEL Apply 2 g topically 4 (four) times daily.   famotidine (PEPCID) 20 MG tablet TAKE 1 TABLET BY MOUTH TWICE  DAILY   fluticasone (FLONASE) 50 MCG/ACT nasal spray Place 2 sprays into both nostrils daily. (Patient taking differently: Place 2 sprays into both nostrils as needed for allergies.)   losartan (COZAAR) 25 MG tablet TAKE 1 TABLET BY MOUTH DAILY   memantine (NAMENDA) 10 MG tablet Take 1 tablet (10 mg total) by mouth 2 (two) times daily.   Multiple Vitamins-Minerals (ICAPS AREDS 2 PO) Take 1 capsule by mouth 2 (two) times daily at 10 AM and 5 PM. Morning and evening   VIACTIV 500-500-40 MG-UNT-MCG CHEW CHEW 1 TABLET BY MOUTH EVERY MORNING.   No facility-administered encounter medications on file as of 01/10/2023.     Objective:     PHYSICAL EXAMINATION:    VITALS:   Vitals:   01/10/23 1520  BP: (!) 157/78  Pulse: 70  SpO2: 94%  Weight: 181 lb (82.1 kg)  Height: 5\' 7"  (1.702 m)    GEN:  The patient appears stated age and is in NAD. HEENT:  Normocephalic, atraumatic.   Neurological  examination:  General: NAD, well-groomed, appears stated age. Orientation: The patient is alert. Oriented to person, place and date Cranial nerves: There is good facial symmetry.The speech is fluent and clear. No aphasia or dysarthria. Fund of knowledge is appropriate. Recent memory impaired and remote memory is normal.  Attention and concentration are normal.  Able to name objects and repeat phrases.  Hearing is intact to conversational tone .   Delayed recall 3/3 Sensation: Sensation is intact to light touch throughout Motor: Strength is at least antigravity x4. DTR's 2/4 in UE/LE      08/27/2020   10:00 AM  Montreal Cognitive Assessment   Visuospatial/ Executive (0/5) 4  Naming (0/3) 3  Attention: Read list of digits (0/2) 2  Attention: Read list of letters (0/1) 1  Attention: Serial 7 subtraction starting at 100 (0/3) 2  Language: Repeat phrase (0/2) 2  Language : Fluency (0/1) 0  Abstraction (0/2) 2  Delayed Recall (0/5) 1  Orientation (0/6) 6  Total 23  Adjusted Score (based on education) 24       07/28/2022    2:00 PM 11/26/2019    2:47 PM 08/14/2018    4:00 PM  MMSE - Mini Mental State Exam  Orientation to time 5 4 4   Orientation to Place 5 5 5   Registration 3 3 3   Attention/ Calculation 5 5 5   Recall 3 3 3   Language- name 2 objects 2 2 2   Language- repeat 1 1 1   Language- follow 3 step command 3 3 3   Language- read & follow direction 1 1 1   Write a sentence 1 1 1   Copy design 1 1 1   Total score 30 29 29        Movement examination: Tone: There is normal tone in the UE/LE Abnormal movements:  no tremor.  No myoclonus.  No asterixis.   Coordination:  There is no decremation with RAM's. Normal finger to nose  Gait and Station: The patient has no difficulty arising out of a deep-seated chair without the use of the hands. The patient's stride length is good.  Gait is cautious and narrow.    Thank you for allowing [place] the opportunity to participate in the care of  this nice patient. Please do not hesitate to contact us for any questions or concerns.   Total time spent on today's visit was 41 minutes dedicated to this patient today, preparing to see patient, examining the patient, ordering tests and/or medications and counseling the patient, documenting clinical information in the EHR or other health record, independently interpreting results and communicating results to the patient/family, discussing treatment and goals, answering patient's questions and coordinating care.  Cc:  [employer] Games, MD  Marlowe Kays 01/10/2023 6:10 PM

## 2023-01-10 NOTE — Patient Instructions (Addendum)
It was a pleasure to see you today at our office.   Recommendations:  Follow up in 6 months   Continue memantine 10 mg mg two times a day   Consider [facility] or Volunteer Activities   Repeat neuropsychological evaluation 05/2023     RECOMMENDATIONS FOR ALL PATIENTS WITH MEMORY PROBLEMS:  1. Continue to exercise (Recommend 30 minutes of walking everyday, or 3 hours every week) 2. Increase social interactions - continue going to [place] and enjoy social gatherings with friends and family 3. Eat healthy, avoid fried foods and eat more fruits and vegetables 4. Maintain adequate blood pressure, blood sugar, and blood cholesterol level. Reducing the risk of stroke and cardiovascular disease also helps promoting better memory. 5. Avoid stressful situations. Live a simple life and avoid aggravations. Organize your time and prepare for the next day in anticipation. 6. Sleep well, avoid any interruptions of sleep and avoid any distractions in the bedroom that may interfere with adequate sleep quality 7. Avoid sugar, avoid sweets as there is a strong link between excessive sugar intake, diabetes, and cognitive impairment We discussed the Mediterranean diet, which has been shown to help patients reduce the risk of progressive memory disorders and reduces cardiovascular risk. This includes eating fish, eat fruits and green leafy vegetables, nuts like almonds and hazelnuts, walnuts, and also use olive oil. Avoid fast foods and fried foods as much as possible. Avoid sweets and sugar as sugar use has been linked to worsening of memory function.  There is always a concern of gradual progression of memory problems. If this is the case, then we may need to adjust level of care according to patient needs. Support, both to the patient and caregiver, should then be put into place.    FALL PRECAUTIONS: Be cautious when walking. Scan the area for obstacles that may increase the risk of trips and falls. When  getting up in the mornings, sit up at the edge of the bed for a few minutes before getting out of bed. Consider elevating the bed at the head end to avoid drop of blood pressure when getting up. Walk always in a well-lit room (use night lights in the walls). Avoid area rugs or power cords from appliances in the middle of the walkways. Use a walker or a cane if necessary and consider physical therapy for balance exercise. Get your eyesight checked regularly.  FINANCIAL OVERSIGHT: Supervision, especially oversight when making financial decisions or transactions is also recommended.  HOME SAFETY: Consider the safety of the kitchen when operating appliances like stoves, microwave oven, and blender. Consider having supervision and share cooking responsibilities until no longer able to participate in those. Accidents with firearms and other hazards in the house should be identified and addressed as well.   ABILITY TO BE LEFT ALONE: If patient is unable to contact 911 operator, consider using LifeLine, or when the need is there, arrange for someone to stay with patients. Smoking is a fire hazard, consider supervision or cessation. Risk of wandering should be assessed by caregiver and if detected at any point, supervision and safe proof recommendations should be instituted.  MEDICATION SUPERVISION: Inability to self-administer medication needs to be constantly addressed. Implement a mechanism to ensure safe administration of the medications.       Mediterranean Diet A Mediterranean diet refers to food and lifestyle choices that are based on the traditions of countries located on the [employer]. This way of eating has been shown to help prevent certain conditions  and improve outcomes for people who have chronic diseases, like kidney disease and heart disease. What are tips for following this plan? Lifestyle  Cook and eat meals together with your family, when possible. Drink enough fluid to keep  your urine clear or pale yellow. Be physically active every day. This includes: Aerobic exercise like running or swimming. Leisure activities like gardening, walking, or housework. Get 7-8 hours of sleep each night. If recommended by your health care provider, drink red wine in moderation. This means 1 glass a day for nonpregnant women and 2 glasses a day for men. A glass of wine equals 5 oz (150 mL). Reading food labels  Check the serving size of packaged foods. For foods such as rice and pasta, the serving size refers to the amount of cooked product, not dry. Check the total fat in packaged foods. Avoid foods that have saturated fat or trans fats. Check the ingredients list for added sugars, such as corn syrup. Shopping  At the grocery store, buy most of your food from the areas near the walls of the store. This includes: Fresh fruits and vegetables (produce). Grains, beans, nuts, and seeds. Some of these may be available in unpackaged forms or large amounts (in bulk). Fresh seafood. Poultry and eggs. Low-fat dairy products. Buy whole ingredients instead of prepackaged foods. Buy fresh fruits and vegetables in-season from local farmers markets. Buy frozen fruits and vegetables in resealable bags. If you do not have access to quality fresh seafood, buy precooked frozen shrimp or canned fish, such as tuna, salmon, or sardines. Buy small amounts of raw or cooked vegetables, salads, or olives from the deli or salad bar at your store. Stock your pantry so you always have certain foods on hand, such as olive oil, canned tuna, canned tomatoes, rice, pasta, and beans. Cooking  Cook foods with extra-virgin olive oil instead of using butter or other vegetable oils. Have meat as a side dish, and have vegetables or grains as your main dish. This means having meat in small portions or adding small amounts of meat to foods like pasta or stew. Use beans or vegetables instead of meat in common dishes  like chili or lasagna. Experiment with different cooking methods. Try roasting or broiling vegetables instead of steaming or sauteing them. Add frozen vegetables to soups, stews, pasta, or rice. Add nuts or seeds for added healthy fat at each meal. You can add these to yogurt, salads, or vegetable dishes. Marinate fish or vegetables using olive oil, lemon juice, garlic, and fresh herbs. Meal planning  Plan to eat 1 vegetarian meal one day each week. Try to work up to 2 vegetarian meals, if possible. Eat seafood 2 or more times a week. Have healthy snacks readily available, such as: Vegetable sticks with hummus. Greek yogurt. Fruit and nut trail mix. Eat balanced meals throughout the week. This includes: Fruit: 2-3 servings a day Vegetables: 4-5 servings a day Low-fat dairy: 2 servings a day Fish, poultry, or lean meat: 1 serving a day Beans and legumes: 2 or more servings a week Nuts and seeds: 1-2 servings a day Whole grains: 6-8 servings a day Extra-virgin olive oil: 3-4 servings a day Limit red meat and sweets to only a few servings a month What are my food choices? Mediterranean diet Recommended Grains: Whole-grain pasta. Brown rice. Bulgar wheat. Polenta. Couscous. Whole-wheat bread. Orpah Cobb. Vegetables: Artichokes. Beets. Broccoli. Cabbage. Carrots. Eggplant. Green beans. Chard. Kale. Spinach. Onions. Leeks. Peas. Squash. Tomatoes. Peppers. Radishes. Fruits:  Apples. Apricots. Avocado. Berries. Bananas. Cherries. Dates. Figs. Grapes. Lemons. Melon. Oranges. Peaches. Plums. Pomegranate. Meats and other protein foods: Beans. Almonds. Sunflower seeds. Pine nuts. Peanuts. Cod. Salmon. Scallops. Shrimp. Tuna. Tilapia. Clams. Oysters. Eggs. Dairy: Low-fat milk. Cheese. Greek yogurt. Beverages: Water. Red wine. Herbal tea. Fats and oils: Extra virgin olive oil. Avocado oil. Grape seed oil. Sweets and desserts: [place] yogurt with honey. Baked apples. Poached pears. Trail  mix. Seasoning and other foods: Basil. Cilantro. Coriander. Cumin. Mint. Parsley. Sage. Rosemary. Tarragon. Garlic. Oregano. Thyme. Pepper. Balsalmic vinegar. Tahini. Hummus. Tomato sauce. Olives. Mushrooms. Limit these Grains: Prepackaged pasta or rice dishes. Prepackaged cereal with added sugar. Vegetables: Deep fried potatoes (french fries). Fruits: Fruit canned in syrup. Meats and other protein foods: Beef. Pork. Lamb. Poultry with skin. Hot dogs. Tomasa Blase. Dairy: Ice cream. Sour cream. Whole milk. Beverages: Juice. Sugar-sweetened soft drinks. Beer. Liquor and spirits. Fats and oils: Butter. Canola oil. Vegetable oil. Beef fat (tallow). Lard. Sweets and desserts: Cookies. Cakes. Pies. Candy. Seasoning and other foods: Mayonnaise. Premade sauces and marinades. The items listed may not be a complete list. Talk with your dietitian about what dietary choices are right for you. Summary The Mediterranean diet includes both food and lifestyle choices. Eat a variety of fresh fruits and vegetables, beans, nuts, seeds, and whole grains. Limit the amount of red meat and sweets that you eat. Talk with your health care provider about whether it is safe for you to drink red wine in moderation. This means 1 glass a day for nonpregnant women and 2 glasses a day for men. A glass of wine equals 5 oz (150 mL). This information is not intended to replace advice given to you by your health care provider. Make sure you discuss any questions you have with your health care provider. Document Released: 09/10/2015 Document Revised: 10/13/2015 Document Reviewed: 09/10/2015 Elsevier Interactive Patient Education  2017 [employer].    Your provider has requested that you have labwork completed today. Please go to [facility] ([id number]) on the second floor of this building before leaving the office today. You do not need to check in. If you are not called within 15 minutes please check with the front desk.

## 2023-01-30 ENCOUNTER — Ambulatory Visit: Payer: Self-pay | Admitting: Family Medicine

## 2023-01-30 NOTE — Telephone Encounter (Signed)
Attempt made to contact patient: no answer: left voicemail.

## 2023-01-30 NOTE — Telephone Encounter (Signed)
   Chief Complaint: leg injury Symptoms: fluid leaking from leg injury Frequency: 8 days Pertinent Negatives: Patient denies fever, pain Disposition: [] ED /[] Urgent Care (no appt availability in office) / [x] Appointment(In office/virtual)/ []  [place] [facility]/ [] Home Care/ [] Refused Recommended Disposition /[] [place] [facility]/ []  Follow-up with PCP Additional Notes: Patient with a history of lymphadema reports she woke up 8 days ago with a small hole in her leg that is leaking fluid. Patient denies fever, pain, or difficulty walking. Patient requesting in office appt to rule out infection. Patient scheduled 12/21 per protocol. Patient advised to call back with worsening symptoms. Patient verbalized understanding.     Reason for Disposition  Large swelling or bruise > 2 inches (5 cm)  Answer Assessment - Initial Assessment Questions 1. MECHANISM: "How did the injury happen?" (e.g., twisting injury, direct blow)      Randomly popped up on my leg 2. ONSET: "When did the injury happen?" (Minutes or hours ago)      8 days 3. LOCATION: "Where is the injury located?"      Front of leg 4. APPEARANCE of INJURY: "What does the injury look like?"  (e.g., deformity of leg)     Swelling leg 5. SEVERITY: "Can you put weight on that leg?" "Can you walk?"      I can walk 6. SIZE: For cuts, bruises, or swelling, ask: "How large is it?" (e.g., inches or centimeters)      Small hole 7. PAIN: "Is there pain?" If Yes, ask: "How bad is the pain?"   "What does it keep you from doing?" (e.g., Scale 1-10; or mild, moderate, severe)   -  NONE: (0): no pain   -  MILD (1-3): doesn't interfere with normal activities    -  MODERATE (4-7): interferes with normal activities (e.g., work or school) or awakens from sleep, limping    -  SEVERE (8-10): excruciating pain, unable to do any normal activities, unable to walk     none 8. TETANUS: For any breaks in the skin, ask: "When was the last tetanus  booster?"     none 9. OTHER SYMPTOMS: "Do you have any other symptoms?"      Water flowing out of leg due to lymphadema  Protocols used: Leg Injury-A-AH

## 2023-01-30 NOTE — Telephone Encounter (Signed)
Copied from CRM [id number]. Topic: Clinical - Red Word Triage >> Jan 30, 2023  2:12 PM Geroge Baseman wrote: Infection, swelling, bleeding pus   Pt stated it was difficult to ear - stated noise in line.  Nurse informed patient to disconnect call and nurse would call right back.  Attempted to call patient back and goes straight to voicemail.

## 2023-01-30 NOTE — Telephone Encounter (Signed)
Patient has been scheduled an appointment with provider on 01/31/23 at 1 pm.

## 2023-01-30 NOTE — Telephone Encounter (Signed)
Attempt to call pt back: no answer: call goes straight to voicemail: left message.

## 2023-01-31 ENCOUNTER — Ambulatory Visit (INDEPENDENT_AMBULATORY_CARE_PROVIDER_SITE_OTHER): Payer: Medicare Other | Admitting: Family Medicine

## 2023-01-31 VITALS — BP 124/55 | HR 76 | Ht 67.0 in | Wt 184.0 lb

## 2023-01-31 DIAGNOSIS — I878 Other specified disorders of veins: Secondary | ICD-10-CM | POA: Diagnosis not present

## 2023-01-31 DIAGNOSIS — R053 Chronic cough: Secondary | ICD-10-CM

## 2023-01-31 DIAGNOSIS — I890 Lymphedema, not elsewhere classified: Secondary | ICD-10-CM | POA: Diagnosis not present

## 2023-01-31 DIAGNOSIS — S81802A Unspecified open wound, left lower leg, initial encounter: Secondary | ICD-10-CM

## 2023-01-31 NOTE — Progress Notes (Signed)
   Acute Office Visit  Subjective:     Patient ID: Michelle [name], female    DOB: [birth year], 89 y.o.   MRN: [id number]  Chief Complaint  Patient presents with   Leg Swelling    HPI Patient is in today for spot on leg she noticed 9 days ago that is leaking fluids. Hx of lymphedema.  He denies any known injury or trauma it has not been painful no fever or chills but it has continued to leak quite a large amount.  Did stop the ARB for a couple of weeks with no improvement in her cough and her blood pressure started going up so they restarted it.  Has had a lot of runny nose and drainage lately.  ROS      Objective:    BP (!) 124/55   Pulse 76   Ht 5' 7 (1.702 m)   Wt 184 lb (83.5 kg)   SpO2 94%   BMI 28.82 kg/m     Physical Exam Vitals reviewed.  Constitutional:      Appearance: Normal appearance.  HENT:     Head: Normocephalic.  Pulmonary:     Effort: Pulmonary effort is normal.  Skin:    Comments: She has a small approximately half a centimeter open area with no erythema that is draining serous fluid.  No foul odor.  No surrounding erythema or cellulitis.  She does have a dusky appearance to her left foot that is chronic.  Neurological:     Mental Status: She is alert and oriented to person, place, and time.  Psychiatric:        Mood and Affect: Mood normal.        Behavior: Behavior normal.     No results found for any visits on 01/31/23.      Assessment & Plan:   Problem List Items Addressed This Visit       Cardiovascular and Mediastinum   Venous stasis of both lower extremities     Other   Lymphedema of both lower extremities   Other Visit Diagnoses       Chronic cough    -  Primary   Relevant Orders   Ambulatory referral to ENT     Wound of left lower extremity, initial encounter           Chronic cough-recommend restart nasal steroid spray and refer to ENT.  Stopping the ARB for couple of weeks did not help though sometimes it can take  longer for the cough to completely resolve when you have been on an ARB for years.  Wound with serous drainage.  Discussed options no sign of acute infection currently.  Working on treat with an Radio broadcast assistant for compression to reduce the leaking from the wound.  Call if any new or worsening symptoms otherwise return on Friday to evaluate the area and repeat wrapping if needed.  To keep legs elevated during the day when she is not up and moving around.   No orders of the defined types were placed in this encounter.   No follow-ups on file.  Dorothyann Byars, MD

## 2023-02-03 ENCOUNTER — Ambulatory Visit (INDEPENDENT_AMBULATORY_CARE_PROVIDER_SITE_OTHER): Payer: Medicare Other | Admitting: Family Medicine

## 2023-02-03 ENCOUNTER — Encounter: Payer: Self-pay | Admitting: Family Medicine

## 2023-02-03 DIAGNOSIS — S81802A Unspecified open wound, left lower leg, initial encounter: Secondary | ICD-10-CM | POA: Diagnosis not present

## 2023-02-03 NOTE — Progress Notes (Signed)
 Agree with documentation as above.   Nani Gasser, MD

## 2023-02-03 NOTE — Progress Notes (Signed)
 HPI  Pt here to have left leg wrapped (unna boots)                  Assessment and Plan:  Pt advised to return in 3 days 02/06/23. To see if there has been any improvement.

## 2023-02-06 ENCOUNTER — Encounter: Payer: Self-pay | Admitting: Family Medicine

## 2023-02-06 ENCOUNTER — Ambulatory Visit (INDEPENDENT_AMBULATORY_CARE_PROVIDER_SITE_OTHER): Payer: Medicare Other

## 2023-02-06 DIAGNOSIS — I890 Lymphedema, not elsewhere classified: Secondary | ICD-10-CM

## 2023-02-06 DIAGNOSIS — S81802A Unspecified open wound, left lower leg, initial encounter: Secondary | ICD-10-CM

## 2023-02-06 NOTE — Progress Notes (Signed)
   Established Patient Office Visit  Subjective  Patient ID: Michelle [name], female    DOB: 1932-02-11  Age: 89 y.o. MRN: [id number]  Chief Complaint  Patient presents with   Wound Check    HPI Here for follow up wound check on left lower leg.  She has a couple other spots she wants me to look at today she has got 2 crusted lesions on her right leg that have been a little tender she has been using an over-the-counter antibiotic ointment on them and says that it has been helping.  She removed the Unna boot from the left leg today and took a shower before she came in.     ROS    Objective:     There were no vitals taken for this visit.   Physical Exam   No results found for any visits on 02/06/23.    The ASCVD Risk score (Arnett DK, et al., 2019) failed to calculate for the following reasons:   The 2019 ASCVD risk score is only valid for ages 59 to 36   Risk score cannot be calculated because patient has a medical history suggesting prior/existing ASCVD    Assessment & Plan:   Problem List Items Addressed This Visit       Other   Lymphedema of both lower extremities   Other Visit Diagnoses       Wound of left lower extremity, initial encounter    -  Primary      Wound appears to be fairly well-healed there is a little bit of scab left over but it should continue to heal well we discussed applying Vaseline every night to both legs almost like lotion and then wearing compression stockings anywhere between 15-25 pressure on both legs and the mornings okay to remove after dinnertime.  Call if any concerns or not healing.  Stop using the polysporin.    Return if symptoms worsen or fail to improve.    Dorothyann Byars, MD

## 2023-02-06 NOTE — Addendum Note (Signed)
 Addended by: Nani Gasser D on: 02/06/2023 03:57 PM   Modules accepted: Level of Service

## 2023-02-13 ENCOUNTER — Ambulatory Visit (INDEPENDENT_AMBULATORY_CARE_PROVIDER_SITE_OTHER): Payer: Medicare Other | Admitting: Family Medicine

## 2023-02-13 ENCOUNTER — Encounter: Payer: Self-pay | Admitting: Family Medicine

## 2023-02-13 VITALS — Ht 67.0 in | Wt 183.0 lb

## 2023-02-13 DIAGNOSIS — Z0000 Encounter for general adult medical examination without abnormal findings: Secondary | ICD-10-CM

## 2023-02-13 NOTE — Progress Notes (Signed)
 Subjective:   Michelle [name] is a 89 y.o. female who presents for Medicare Annual (Subsequent) preventive examination.  Visit Complete: Virtual I connected with  Michelle [name] on 02/13/23 by a audio enabled telemedicine application and verified that I am speaking with the correct person using two identifiers.  Patient Location: Home  Provider Location: Home Office  I discussed the limitations of evaluation and management by telemedicine. The patient expressed understanding and agreed to proceed.  Vital Signs: Because this visit was a virtual/telehealth visit, some criteria may be missing or patient reported. Any vitals not documented were not able to be obtained and vitals that have been documented are patient reported.  Patient Medicare AWV questionnaire was completed by the patient on n/a; I have confirmed that all information answered by patient is correct and no changes since this date.  Cardiac Risk Factors include: family history of premature cardiovascular disease;dyslipidemia;hypertension     Objective:    Today's Vitals   02/13/23 1342  Weight: 183 lb (83 kg)  Height: 5' 7 (1.702 m)   Body mass index is 28.66 kg/m.     02/13/2023    1:54 PM 01/10/2023    3:21 PM 07/28/2022    1:01 PM 02/07/2022    1:49 PM 12/15/2021   11:29 AM 06/03/2021    2:47 PM 02/02/2021   11:28 AM  Advanced Directives  Does Patient Have a Medical Advance Directive? Yes Yes Yes Yes Yes Yes Yes  Type of Estate Agent of [place];Living will Healthcare Power of [place];Living will Healthcare Power of Attorney Living will   Living will;Healthcare Power of Attorney  Does patient want to make changes to medical advance directive? No - Patient declined  No - Guardian declined No - Patient declined   No - Patient declined  Copy of Healthcare Power of Attorney in Chart?   No - copy requested    No - copy requested    Current Medications (verified) Outpatient Encounter Medications as  of 02/13/2023  Medication Sig   acetaminophen  (TYLENOL ) 325 MG tablet Take 650 mg by mouth as needed for mild pain.    AMBULATORY NON FORMULARY MEDICATION Medication Name: Set CPAP to 8-11 mm HG water pressure. Please send down load after 4 weeks of use to [phone].   anastrozole  (ARIMIDEX ) 1 MG tablet TAKE 1 TABLET BY MOUTH DAILY   atorvastatin  (LIPITOR) 20 MG tablet TAKE 1 TABLET BY MOUTH EVERY  OTHER NIGHT   cholecalciferol (VITAMIN D3) 25 MCG (1000 UT) tablet Take 1,000 Units by mouth daily.   clopidogrel  (PLAVIX ) 75 MG tablet TAKE 1 TABLET BY MOUTH DAILY   diclofenac  Sodium (VOLTAREN ) 1 % GEL Apply 2 g topically 4 (four) times daily.   famotidine  (PEPCID ) 20 MG tablet TAKE 1 TABLET BY MOUTH TWICE  DAILY   fluticasone  (FLONASE ) 50 MCG/ACT nasal spray Place 2 sprays into both nostrils daily. (Patient taking differently: Place 2 sprays into both nostrils as needed for allergies.)   losartan  (COZAAR ) 25 MG tablet TAKE 1 TABLET BY MOUTH DAILY   memantine  (NAMENDA ) 10 MG tablet Take 1 tablet (10 mg total) by mouth 2 (two) times daily.   Multiple Vitamins-Minerals (ICAPS AREDS 2 PO) Take 1 capsule by mouth 2 (two) times daily at 10 AM and 5 PM. Morning and evening   VIACTIV 500-500-40 MG-UNT-MCG CHEW CHEW 1 TABLET BY MOUTH EVERY MORNING.   albuterol  (VENTOLIN  HFA) 108 (90 Base) MCG/ACT inhaler Inhale 2 puffs into the lungs every 6 (  six) hours as needed for wheezing or shortness of breath. (Patient not taking: Reported on 02/13/2023)   No facility-administered encounter medications on file as of 02/13/2023.    Allergies (verified) Benadryl [diphenhydramine hcl], Doxycycline, Simvastatin, Fosamax  [alendronate  sodium], Lisinopril , and Nsaids   History: Past Medical History:  Diagnosis Date   Abdominal aortic aneurysm (AAA) 3.0 cm to 5.0 cm in diameter in female 11/18/2016   Due for repeat in 12/2019.   Allergic rhinitis 05/26/2020   Aneurysm of infrarenal abdominal aorta 11/19/2013   2.6 cm  in 2006 on CT.  CT 10/15 show 3 cm lesion.  US   2018 - no change   Arthritis    hips, lower back   Basal cell carcinoma 2011   R back   Bell's palsy 1984   Bilateral cold feet 03/28/2019   Blood transfusion without reported diagnosis 07/2015   Bradycardia    Cataract    Cholecystitis 2004   Chronic constipation 08/14/2018   Clotting disorder    Complication of anesthesia    COPD (chronic obstructive pulmonary disease)    Essential hypertension, benign 09/26/2007   GERD 11/03/2005   Hiatal hernia 2008   History of CVA (cerebrovascular accident) 09/24/2007   left cerebellar infarction, PICA territory   History of endometrial cancer 04/30/2013   uterine   History of GI bleed 07/04/2015   Hyperlipidemia 11/03/2005   LDL goal <100   Interstitial lung disease 03/28/2019   Iron deficiency anemia, unspecified    Macular degeneration    Malignant neoplasm of upper-outer quadrant of right breast in female, estrogen receptor positive 02/21/2011   Mild vascular neurocognitive disorder 05/14/2021   OSA (obstructive sleep apnea), Severe, AHI 61 04/28/2022   Osteoporosis 11/19/2014   DEXA 10/27/14.  Repeat in 2 yr.  High risk to start medication.    Polyp of colon 2008, 2014   Benign   PONV (postoperative nausea and vomiting)    Primary osteoarthritis of right knee 06/22/2015   Shingles 2008   Squamous cell skin cancer, face 03/22/2012   Actively seeing dermatology.   Tendonitis    rt hand   Venous stasis of both lower extremities 08/14/2018   Vocal cord polyp    Past Surgical History:  Procedure Laterality Date   ABDOMINAL HYSTERECTOMY  2005   APPENDECTOMY     BASAL CELL CARCINOMA EXCISION     biospy  10/28/2009   Shave Biospy skin Left hand(Acitinic Keratoses), Right Upper Arm (superficial Basal Cell), Right Upper Back(Superficial Basal Cell), Left Neck ( Solar Lentigo & Seborrheic Keratoses)   BREAST BIOPSY  02/11/2011   Right Breast Needle Core Biospy - Upper Outer Quadrant;  ER/PR 100%, Her-2 Neu neg.; Ki-67 10%   BREAST LUMPECTOMY WITH RADIOACTIVE SEED LOCALIZATION Right 06/12/2018   Procedure: RIGHT BREAST LUMPECTOMY WITH RADIOACTIVE SEED LOCALIZATION;  Surgeon: Ebbie Cough, MD;  Location: [facility];  Service: General;  Laterality: Right;   BREAST MAMMOSITE  03/14/2011   Procedure: MAMMOSITE BREAST;  Surgeon: Cough Ebbie, MD;  Location: [facility];  Service: General;  Laterality: Right;   BREAST SURGERY     right breast lumpectomy snbx   CHOLECYSTECTOMY  2004   COLONOSCOPY     COLONOSCOPY W/ POLYPECTOMY     2008, 2014 (benign)   COLONOSCOPY WITH PROPOFOL  N/A 07/09/2015   Procedure: COLONOSCOPY WITH PROPOFOL ;  Surgeon: Lesta JULIANNA Fitz, MD;  Location: [facility];  Service: Endoscopy;  Laterality: N/A;   DILATION AND CURETTAGE OF UTERUS N/A 04/24/2013  Procedure: DILATATION AND CURETTAGE;  Surgeon: Burnard VEAR Pate, MD;  Location: [facility];  Service: Gynecology;  Laterality: N/A;   ESOPHAGOGASTRODUODENOSCOPY N/A 07/06/2015   Procedure: ESOPHAGOGASTRODUODENOSCOPY (EGD);  Surgeon: Jerrell Sol, MD;  Location[facility];  Service: Endoscopy;  Laterality: N/A;   HYSTEROSCOPY N/A 04/24/2013   Procedure: HYSTEROSCOPY;  Surgeon: Burnard VEAR Pate, MD;  Location: [facility];  Service: Gynecology;  Laterality: N/A;   ROBOTIC ASSISTED TOTAL HYSTERECTOMY WITH BILATERAL SALPINGO OOPHERECTOMY Bilateral 06/11/2013   Procedure: ROBOTIC ASSISTED TOTAL HYSTERECTOMY WITH BILATERAL SALPINGO OOPHORECTOMY WITH POSSIBLE STAGING, EPISIOTOMY;  Surgeon: Sari Bachelor, MD;  Location: [facility];  Service: Gynecology;  Laterality: Bilateral;   SQUAMOUS CELL CARCINOMA EXCISION     TEAR DUCT PROBING     TONSILLECTOMY     vocal cord poylps  1969   excision   Family History  Problem Relation Age of Onset   Heart disease Father    Cancer Sister 63       Lymphoma   Breast cancer Sister    Cancer Paternal Aunt        Breast cancer   Cancer  Daughter 46       Bilateral Breast Cancer   Breast cancer Daughter 92   Cancer Daughter        Breast Cancer   Breast cancer Daughter    Cancer Sister        2005 - Renal Cell Cancer and Breast Cancer   Breast cancer Sister    Social History   Socioeconomic History   Marital status: Widowed    Spouse name: Not on file   Number of children: 2   Years of education: 93   Highest education level: 12th grade  Occupational History   Occupation: Retired    Comment: photographer; financial planner  Tobacco Use   Smoking status: Former    Current packs/day: 0.00    Average packs/day: 1 pack/day for 30.0 years (30.0 ttl pk-yrs)    Types: Cigarettes    Start date: 01/31/1966    Quit date: 02/01/1996    Years since quitting: 27.0   Smokeless tobacco: Never  Vaping Use   Vaping status: Never Used  Substance and Sexual Activity   Alcohol use: Not Currently   Drug use: No   Sexual activity: Yes    Partners: Male    Birth control/protection: Surgical, None    Comment: Menarche age 48, menopause age 89, HRT x 1year  Other Topics Concern   Not on file  Social History Narrative   Lives with her daughter. Her husband passed away one year ago. It has been a difficult year for her. She does go to church and stays in touch with her friends. She enjoys spending time with her friends and family.   Left handed   Social Drivers of Health   Financial Resource Strain: Low Risk  (02/13/2023)   Overall Financial Resource Strain (CARDIA)    Difficulty of Paying Living Expenses: Not hard at all  Food Insecurity: No Food Insecurity (02/13/2023)   Hunger Vital Sign    Worried About Running Out of Food in the Last Year: Never true    Ran Out of Food in the Last Year: Never true  Transportation Needs: No Transportation Needs (02/13/2023)   PRAPARE - Administrator, Civil Service (Medical): No    Lack of Transportation (Non-Medical): No  Physical Activity: Inactive (02/13/2023)   Exercise Vital Sign     Days of Exercise per Week: 0  days    Minutes of Exercise per Session: 0 min  Stress: No Stress Concern Present (02/13/2023)   [employer] of Occupational Health - Occupational Stress Questionnaire    Feeling of Stress : Not at all  Social Connections: Moderately Integrated (02/13/2023)   Social Connection and Isolation Panel [NHANES]    Frequency of Communication with Friends and Family: More than three times a week    Frequency of Social Gatherings with Friends and Family: Once a week    Attends Religious Services: More than 4 times per year    Active Member of [employer] or Organizations: No    Attends Engineer, Structural: More than 4 times per year    Marital Status: Widowed    Tobacco Counseling Counseling given: Not Answered   Clinical Intake:  Pre-visit preparation completed: No  Pain : No/denies pain     BMI - recorded: 28.6 Nutritional Status: BMI 25 -29 Overweight Nutritional Risks: None Diabetes: No  How often do you need to have someone help you when you read instructions, pamphlets, or other written materials from your doctor or pharmacy?: 1 - Never What is the last grade level you completed in school?: 14  Interpreter Needed?: No      Activities of Daily Living    02/13/2023    1:45 PM  In your present state of health, do you have any difficulty performing the following activities:  Hearing? 0  Vision? 0  Difficulty concentrating or making decisions? 0  Walking or climbing stairs? 0  Dressing or bathing? 0  Doing errands, shopping? 0  Preparing Food and eating ? N  Using the Toilet? N  In the past six months, have you accidently leaked urine? Y  Do you have problems with loss of bowel control? N  Managing your Medications? N  Managing your Finances? N  Housekeeping or managing your Housekeeping? N    Patient Care Team: [name], MD as PCP - General (Family Medicine) Lavonna, MD as Attending Physician  (Dermatology) Wertman, Sara E, PA-C (Neurology) Richie Hussar, MD Neurology  Loretha Ash, MD as Consulting Physician (Hematology and Oncology) Alvia Rush, MD Retina Specialist    Indicate any recent Medical Services you may have received from other than [facility] providers in the past year (date may be approximate).     Assessment:   This is a routine wellness examination for Michelle [name].  Hearing/Vision screen Hearing Screening - Comments:: Unable to test, grossly intact Vision Screening - Comments:: Unable to test, wears reading glasses.    Goals Addressed             This Visit's Progress    Exercise 3x per week (30 min per time)        Depression Screen    02/13/2023    1:53 PM 08/25/2022    1:45 PM 04/28/2022   12:34 PM 02/07/2022    1:50 PM 02/25/2021    2:08 PM 02/02/2021   11:30 AM 05/26/2020    2:26 PM  PHQ 2/9 Scores  PHQ - 2 Score 0 0 0 0 0 0 1    Fall Risk    02/13/2023    1:55 PM 01/10/2023    3:21 PM 08/25/2022    1:45 PM 07/28/2022    1:01 PM 04/28/2022   12:34 PM  Fall Risk   Falls in the past year? 0 0 0 0 0  Number falls in past yr: 0 0 0 0 0  Injury with Fall?  0 0 0 0 0  Risk for fall due to : No Fall Risks  No Fall Risks  No Fall Risks  Follow up  Falls evaluation completed Falls evaluation completed Falls evaluation completed Falls evaluation completed    MEDICARE RISK AT HOME: Medicare Risk at Home Any stairs in or around the home?: No If so, are there any without handrails?: No Home free of loose throw rugs in walkways, pet beds, electrical cords, etc?: Yes Adequate lighting in your home to reduce risk of falls?: Yes Life alert?: No Use of a cane, walker or w/c?: No Grab bars in the bathroom?: Yes Shower chair or bench in shower?: No Elevated toilet seat or a handicapped toilet?: Yes  TIMED UP AND GO:  Was the test performed?  No    Cognitive Function:    01/10/2023    6:00 PM 07/28/2022    2:00 PM 11/26/2019    2:47 PM 08/14/2018     4:00 PM  MMSE - Mini Mental State Exam  Orientation to time 5 5 4 4   Orientation to Place 5 5 5 5   Registration 3 3 3 3   Attention/ Calculation 5 5 5 5   Recall 3 3 3 3   Language- name 2 objects 2 2 2 2   Language- repeat 1 1 1 1   Language- follow 3 step command 3 3 3 3   Language- read & follow direction 1 1 1 1   Write a sentence 1 1 1 1   Copy design 1 1 1 1   Total score 30 30 29 29       08/27/2020   10:00 AM  Montreal Cognitive Assessment   Visuospatial/ Executive (0/5) 4  Naming (0/3) 3  Attention: Read list of digits (0/2) 2  Attention: Read list of letters (0/1) 1  Attention: Serial 7 subtraction starting at 100 (0/3) 2  Language: Repeat phrase (0/2) 2  Language : Fluency (0/1) 0  Abstraction (0/2) 2  Delayed Recall (0/5) 1  Orientation (0/6) 6  Total 23  Adjusted Score (based on education) 24      02/13/2023    1:56 PM 02/07/2022    1:59 PM 02/02/2021   11:40 AM  6CIT Screen  What Year? 0 points 0 points 0 points  What month? 0 points 0 points 0 points  What time? 0 points 0 points 0 points  Count back from 20 0 points 0 points 0 points  Months in reverse 0 points 0 points 0 points  Repeat phrase 0 points 0 points 0 points  Total Score 0 points 0 points 0 points    Immunizations Immunization History  Administered Date(s) Administered   Fluad Quad(high Dose 65+) 11/15/2018, 10/29/2019   Influenza Split 12/08/2010, 11/01/2011, 11/07/2017   Influenza Whole 12/13/2006, 11/19/2008, 11/11/2009   Influenza, High Dose Seasonal PF 11/17/2016   Influenza,inj,Quad PF,6+ Mos 11/22/2012, 11/19/2013, 10/21/2014, 11/10/2015, 11/07/2017   Influenza-Unspecified 11/03/2020, 11/02/2021, 11/10/2022   Moderna Covid-19 Vaccine Bivalent Booster 87yrs & up 11/02/2021   Moderna SARS-COV2 Booster Vaccination 11/10/2020   Moderna Sars-Covid-2 Vaccination 03/05/2019, 04/04/2019, 12/18/2019, 08/20/2020   Pfizer(Comirnaty)Fall Seasonal Vaccine 12 years and older 11/10/2022   Pneumococcal  Conjugate-13 02/18/2014   Pneumococcal Polysaccharide-23 12/13/2006   RSV,unspecified 01/02/2022   Tdap 10/06/2010, 09/13/2019   Zoster Recombinant(Shingrix) 10/18/2021, 12/17/2021   Zoster, Live 12/08/2010    TDAP status: Up to date  Flu Vaccine status: Up to date  Pneumococcal vaccine status: Up to date  Covid-19 vaccine status: Completed vaccines  Qualifies for Shingles  Vaccine? Yes   Zostavax completed Yes   Shingrix Completed?: Yes  Screening Tests Health Maintenance  Topic Date Due   COVID-19 Vaccine (7 - 2024-25 season) 01/05/2023   MAMMOGRAM  08/16/2023   Medicare Annual Wellness (AWV)  02/13/2024   DTaP/Tdap/Td (3 - Td or Tdap) 09/12/2029   Pneumonia Vaccine 55+ Years old  Completed   INFLUENZA VACCINE  Completed   DEXA SCAN  Completed   Zoster Vaccines- Shingrix  Completed   HPV VACCINES  Aged Out   Colonoscopy  Discontinued    Health Maintenance  Health Maintenance Due  Topic Date Due   COVID-19 Vaccine (7 - 2024-25 season) 01/05/2023    Colorectal cancer screening: No longer required.   Mammogram status: Completed 08/16/2022. Repeat every year  Bone Density status: Completed 08/17/2021. Results reflect: Bone density results: OSTEOPOROSIS. Repeat every 2 years.  Lung Cancer Screening: (Low Dose CT Chest recommended if Age 11-80 years, 20 pack-year currently smoking OR have quit w/in 15years.) does not qualify.   Lung Cancer Screening Referral: n/a   Additional Screening:  Hepatitis C Screening: does not qualify; Completed n/a  Vision Screening: Recommended annual ophthalmology exams for early detection of glaucoma and other disorders of the eye. Is the patient up to date with their annual eye exam?  Yes  Who is the provider or what is the name of the office in which the patient attends annual eye exams? [facility]  If pt is not established with a provider, would they like to be referred to a provider to establish care? No .   Dental  Screening: Recommended annual dental exams for proper oral hygiene  Diabetic Foot Exam: n/a   Community Resource Referral / Chronic Care Management: CRR required this visit?  No   CCM required this visit?  No     Plan:     I have personally reviewed and noted the following in the patient's chart:   Medical and social history Use of alcohol, tobacco or illicit drugs  Current medications and supplements including opioid prescriptions. Patient is not currently taking opioid prescriptions. Functional ability and status Nutritional status Physical activity Advanced directives List of other physicians Hospitalizations, surgeries, and ER visits in previous 12 months: none Vitals Screenings to include cognitive, depression, and falls Referrals and appointments  In addition, I have reviewed and discussed with patient certain preventive protocols, quality metrics, and best practice recommendations. A written personalized care plan for preventive services as well as general preventive health recommendations were provided to patient.     Michelle JONELLE Brownie, FNP   02/13/2023   After Visit Summary: (MyChart) Due to this being a telephonic visit, the after visit summary with patients personalized plan was offered to patient via MyChart   Follow-up with PCP as scheduled.

## 2023-02-21 DIAGNOSIS — J310 Chronic rhinitis: Secondary | ICD-10-CM | POA: Diagnosis not present

## 2023-02-21 DIAGNOSIS — R059 Cough, unspecified: Secondary | ICD-10-CM | POA: Diagnosis not present

## 2023-02-21 DIAGNOSIS — R918 Other nonspecific abnormal finding of lung field: Secondary | ICD-10-CM | POA: Diagnosis not present

## 2023-02-21 DIAGNOSIS — I517 Cardiomegaly: Secondary | ICD-10-CM | POA: Diagnosis not present

## 2023-02-21 DIAGNOSIS — R0989 Other specified symptoms and signs involving the circulatory and respiratory systems: Secondary | ICD-10-CM | POA: Diagnosis not present

## 2023-02-21 DIAGNOSIS — R053 Chronic cough: Secondary | ICD-10-CM | POA: Diagnosis not present

## 2023-03-15 ENCOUNTER — Encounter (INDEPENDENT_AMBULATORY_CARE_PROVIDER_SITE_OTHER): Payer: Medicare Other | Admitting: Ophthalmology

## 2023-03-15 DIAGNOSIS — H43813 Vitreous degeneration, bilateral: Secondary | ICD-10-CM

## 2023-03-15 DIAGNOSIS — H353132 Nonexudative age-related macular degeneration, bilateral, intermediate dry stage: Secondary | ICD-10-CM | POA: Diagnosis not present

## 2023-03-15 DIAGNOSIS — H35033 Hypertensive retinopathy, bilateral: Secondary | ICD-10-CM

## 2023-03-15 DIAGNOSIS — I10 Essential (primary) hypertension: Secondary | ICD-10-CM | POA: Diagnosis not present

## 2023-03-15 DIAGNOSIS — D3132 Benign neoplasm of left choroid: Secondary | ICD-10-CM | POA: Diagnosis not present

## 2023-03-24 ENCOUNTER — Encounter: Payer: Self-pay | Admitting: Medical-Surgical

## 2023-03-24 ENCOUNTER — Ambulatory Visit (INDEPENDENT_AMBULATORY_CARE_PROVIDER_SITE_OTHER): Payer: Medicare Other | Admitting: Medical-Surgical

## 2023-03-24 VITALS — BP 137/71 | HR 86 | Temp 98.2°F | Resp 20 | Ht 67.0 in | Wt 186.0 lb

## 2023-03-24 DIAGNOSIS — R059 Cough, unspecified: Secondary | ICD-10-CM

## 2023-03-24 DIAGNOSIS — J301 Allergic rhinitis due to pollen: Secondary | ICD-10-CM | POA: Diagnosis not present

## 2023-03-24 LAB — POCT INFLUENZA A/B
Influenza A, POC: NEGATIVE
Influenza B, POC: NEGATIVE

## 2023-03-24 LAB — POC COVID19 BINAXNOW: SARS Coronavirus 2 Ag: NEGATIVE

## 2023-03-24 MED ORDER — LORATADINE 10 MG PO TABS: 10.0000 mg | ORAL_TABLET | Freq: Every day | ORAL | 3 refills | Status: DC

## 2023-03-24 MED ORDER — ALBUTEROL SULFATE HFA 108 (90 BASE) MCG/ACT IN AERS: 2.0000 | INHALATION_SPRAY | Freq: Four times a day (QID) | RESPIRATORY_TRACT | 3 refills | Status: DC | PRN

## 2023-03-24 MED ORDER — PRO COMFORT SPACER ADULT MISC: 0 refills | Status: DC

## 2023-03-24 NOTE — Progress Notes (Signed)
        Established patient visit  History, exam, impression, and plan:  1. Seasonal allergic rhinitis due to pollen (Primary) 2. Cough, unspecified type Very pleasant 89 year old female accompanied by her daughter presenting today for reports of a sudden onset of severe rhinorrhea with intractable sneezing that occurred around 1:30 in the morning on Thursday (approximately 36 hours ago).  Notes that this came on out of the blue and awakened her from sleep while she had her CPAP on.  She had to take the CPAP off due to the severity of her symptoms.  No recent exposures to known allergens.  Denies sick contacts and tends to avoid groups of people.  Even notes that she did not go to church.  She did have some soreness along her bilateral lower ribs this morning when she coughed however this has completely resolved.  Has had no fever, chills, shortness of breath, substernal chest pain, or GI symptoms.  On evaluation today, she is doing well and sneezing/rhinorrhea has resolved.  She does have nasal spray that was provided by ENT (azelastine) that she tried during the worst of her symptoms but this was not helpful.  On exam, her respirations are even and unlabored.  Scattered expiratory wheezing to the upper lobes anteriorly but all posterior lobes are clear to auscultation.  POCT flu and COVID negative.  She is not currently taking any kind of antihistamine which I feel would be very beneficial.  Starting Claritin 10 mg daily.  Adding a refill of her albuterol inhaler as she does not want know where hers is and they rarely use this.  Since she has some difficulty using it, would like to have her use a spacer to ensure adequate delivery to her lungs.  She already has an appointment scheduled in a week or 2 to follow-up with her PCP so can plan to touch base then on her symptoms and make sure everything is going well.  I did reassure her that I do not think her symptoms are related to CPAP use and that she should  continue to wear this nightly.  Patient and daughter verbalized understanding. - POCT Influenza A/B - POC COVID-19 - albuterol (VENTOLIN HFA) 108 (90 Base) MCG/ACT inhaler; Inhale 2 puffs into the lungs every 6 (six) hours as needed for wheezing or shortness of breath.  Dispense: 18 g; Refill: 3 - loratadine (CLARITIN) 10 MG tablet; Take 1 tablet (10 mg total) by mouth daily.  Dispense: 90 tablet; Refill: 3 - Spacer/Aero-Holding Chambers (PRO COMFORT SPACER ADULT) MISC; Use with Albuterol inhaler.  Dispense: 1 each; Refill: 0  Procedures performed this visit: None.  Return if symptoms worsen or fail to improve.  __________________________________ Thayer Ohm, DNP, APRN, FNP-BC Primary Care and Sports Medicine [facility]

## 2023-03-28 ENCOUNTER — Encounter: Payer: Self-pay | Admitting: Family Medicine

## 2023-03-28 ENCOUNTER — Ambulatory Visit (INDEPENDENT_AMBULATORY_CARE_PROVIDER_SITE_OTHER): Payer: Medicare Other | Admitting: Family Medicine

## 2023-03-28 VITALS — BP 137/78 | HR 76 | Ht 67.0 in | Wt 186.0 lb

## 2023-03-28 DIAGNOSIS — M818 Other osteoporosis without current pathological fracture: Secondary | ICD-10-CM | POA: Diagnosis not present

## 2023-03-28 DIAGNOSIS — F0670 Mild neurocognitive disorder due to known physiological condition without behavioral disturbance: Secondary | ICD-10-CM | POA: Diagnosis not present

## 2023-03-28 DIAGNOSIS — J3489 Other specified disorders of nose and nasal sinuses: Secondary | ICD-10-CM | POA: Diagnosis not present

## 2023-03-28 DIAGNOSIS — I999 Unspecified disorder of circulatory system: Secondary | ICD-10-CM

## 2023-03-28 DIAGNOSIS — I10 Essential (primary) hypertension: Secondary | ICD-10-CM | POA: Diagnosis not present

## 2023-03-28 NOTE — Assessment & Plan Note (Addendum)
 Followed by Neurology at [facility]. Prior MRI of the brain on May 2022 showed mild cerebral volume loss without acute findings or evidence of metastases, but with moderate to extensive chronic small vessel ischemia. Neuropsychological evaluation in April 2023, Dr. Milbert Coulter, yielded a diagnosis of mild vascular neurocognitive disorder, with findings not suggestive of Alzheimer's disease. On Namenda BID.   08/2018: MMSE 29/30 08/27/20: MOCA 23/30 01/2022: [facility] 19/30  She declined testing today.  It looks like she actually does have a follow-up with Dr. Alma Friendly probably in April for repeat testing.  Continue amantadine for now.

## 2023-03-28 NOTE — Patient Instructions (Signed)
 Please take the loratadine 1 tab daily until you run out. Continue using azelastine spray twice a day for 1 more week. Start your Flonase 2 sprays in each nostril daily for 2 weeks.  You will use the azelastine and the Flonase together for 1 week total. After 2 weeks on the Flonase you can decrease down to 1 spray in each nostril daily for maintenance. If you are not feeling better in the next couple of weeks in regards to the runny nose and sneezing then please let us know.

## 2023-03-28 NOTE — Assessment & Plan Note (Signed)
 Well controlled. Continue current regimen. Follow up in  6 mo

## 2023-03-28 NOTE — Progress Notes (Signed)
 Established Patient Office Visit  Subjective  Patient ID: Michelle [name], female    DOB: [birth year]  Age: 89 y.o. MRN: [id number]  Chief Complaint  Patient presents with   Sinusitis    HPI  F/U on runny nose and sneezing, it is keeping her awake at night.      ROS    Objective:     BP 137/78 (BP Location: Left Arm, Patient Position: Sitting, Cuff Size: Large)   Pulse 76   Ht 5\' 7"  (1.702 m)   Wt 186 lb (84.4 kg)   SpO2 96%   BMI 29.13 kg/m    Physical Exam   No results found for any visits on 03/28/23.    The ASCVD Risk score (Arnett DK, et al., 2019) failed to calculate for the following reasons:   The 2019 ASCVD risk score is only valid for ages 43 to 70   Risk score cannot be calculated because patient has a medical history suggesting prior/existing ASCVD    Assessment & Plan:   Problem List Items Addressed This Visit       Cardiovascular and Mediastinum   Essential hypertension, benign - Primary   Well controlled. Continue current regimen. Follow up in  64mo         Nervous and Auditory   Mild vascular neurocognitive disorder   Followed by Neurology at [facility]. Prior MRI of the brain on May 2022 showed mild cerebral volume loss without acute findings or evidence of metastases, but with moderate to extensive chronic small vessel ischemia. Neuropsychological evaluation in April 2023, Dr. Milbert Coulter, yielded a diagnosis of mild vascular neurocognitive disorder, with findings not suggestive of Alzheimer's disease. On Namenda BID.   08/2018: MMSE 29/30 08/27/20: MOCA 23/30 01/2022: [facility] 19/30  She declined testing today.  It looks like she actually does have a follow-up with Dr. Alma Friendly probably in April for repeat testing.  Continue amantadine for now.        Musculoskeletal and Integument   Osteoporosis   Currently on Viactiv chew.  Due for repeat bone density in July of this year.  Last done at [facility].  Also on anastrozole.      Other Visit Diagnoses        Rhinorrhea          Rhinorrhea -most likely due to seasonal allergies.  We discussed a more robust regimen for her allergies.  She has used Flonase in the past and it has worked fairly well for her she has been off of it for several months. Please take the loratadine 1 tab daily until you run out. Continue using azelastine spray twice a day for 1 more week. Start your Flonase 2 sprays in each nostril daily for 2 weeks.  You will use the azelastine and the Flonase together for 1 week total. After 2 weeks on the Flonase you can decrease down to 1 spray in each nostril daily for maintenance. If you are not feeling better in the next couple of weeks in regards to the runny nose and sneezing then please let us know.  Does have a skin lesion on her left forearm just near the antecubital fossa that does need to be removed it has a hyperkeratotic look concerning for possible squamous cell or early squamous.  Return in about 1 month (around 04/25/2023) for remove precancerous skin lesion by shave biopsy.  .    I spent 40 minutes on the day of the encounter to include pre-visit record review, face-to-face  time with the patient and post visit ordering of test.   Nani Gasser, MD

## 2023-03-28 NOTE — Assessment & Plan Note (Addendum)
 Currently on Viactiv chew.  Due for repeat bone density in July of this year.  Last done at [facility].  Also on anastrozole.

## 2023-04-20 ENCOUNTER — Inpatient Hospital Stay: Payer: Medicare Other | Attending: Hematology and Oncology | Admitting: Hematology and Oncology

## 2023-04-20 VITALS — BP 130/59 | HR 83 | Temp 97.9°F | Resp 16 | Wt 186.3 lb

## 2023-04-20 DIAGNOSIS — Z923 Personal history of irradiation: Secondary | ICD-10-CM | POA: Insufficient documentation

## 2023-04-20 DIAGNOSIS — Z08 Encounter for follow-up examination after completed treatment for malignant neoplasm: Secondary | ICD-10-CM | POA: Diagnosis not present

## 2023-04-20 DIAGNOSIS — Z170 Estrogen receptor positive status [ER+]: Secondary | ICD-10-CM | POA: Diagnosis not present

## 2023-04-20 DIAGNOSIS — Z803 Family history of malignant neoplasm of breast: Secondary | ICD-10-CM | POA: Diagnosis not present

## 2023-04-20 DIAGNOSIS — Z85828 Personal history of other malignant neoplasm of skin: Secondary | ICD-10-CM | POA: Insufficient documentation

## 2023-04-20 DIAGNOSIS — C50411 Malignant neoplasm of upper-outer quadrant of right female breast: Secondary | ICD-10-CM | POA: Diagnosis not present

## 2023-04-20 DIAGNOSIS — Z79899 Other long term (current) drug therapy: Secondary | ICD-10-CM | POA: Insufficient documentation

## 2023-04-20 DIAGNOSIS — Z853 Personal history of malignant neoplasm of breast: Secondary | ICD-10-CM | POA: Diagnosis not present

## 2023-04-20 DIAGNOSIS — Z87891 Personal history of nicotine dependence: Secondary | ICD-10-CM | POA: Insufficient documentation

## 2023-04-20 DIAGNOSIS — Z8542 Personal history of malignant neoplasm of other parts of uterus: Secondary | ICD-10-CM | POA: Diagnosis not present

## 2023-04-20 NOTE — Progress Notes (Signed)
 [facility]  Telephone:([phone] Fax:([phone]     ID: Michelle [name] DOB: 12-May-1932  MR#: [id number]  VFI#:[id number]  Patient Care Team: Agapito Games, MD as PCP - General (Family Medicine) Venancio Poisson, MD as Attending Physician (Dermatology) Mateo Flow, MD as Attending Physician (Ophthalmology) Emelia Loron, MD as Consulting Physician (General Surgery) Dorothy Puffer, MD as Consulting Physician (Radiation Oncology) Bernette Redbird, MD as Consulting Physician (Gastroenterology) Elwyn Reach (Neurology) Rachel Moulds, MD as Consulting Physician (Hematology and Oncology) Rachel Moulds, MD  CHIEF COMPLAINT: Recurrent estrogen receptor positive breast cancer  CURRENT TREATMENT: anastrozole  INTERVAL HISTORY:  Michelle [name] is an 89 year old female with mild cognitive impairment who presents for routine follow-up.  She has been experiencing mild forgetfulness over the past year. While her memory loss has not worsened significantly, she occasionally forgets how to perform tasks she used to do, such as making a taco. Despite this, she manages her daily activities, including organizing her medications and handling her bank statements. She is currently taking memantine, which she feels has helped with her memory issues.  She has a history of breast cancer and has been taking anastrozole for the past 5 yrs. She has a supply of the medication for several months. Her last mammogram in July of the previous year was normal, and she is scheduled for another one in July of this year.  She has not been hospitalized or seen any other doctors aside from her primary care physician in the past year. She is generally in good health and has been seeing her primary care physician regularly.   COVID 19 VACCINATION STATUS: fully vaccinated Michelle [name]), with booster 12/2019, also had COVID in March 2020   HISTORY OF CURRENT ILLNESS: From the original intake  note:  Michelle [name] has a history of right breast invasive ductal carcinoma status-post right lumpectomy with sentinel lymph node biopsy 03/14/2011 ([id number]), for a 0.52 cm, grade 1 invasive ductal carcinoma, both sentinel lymph nodes clear; margins negative; estrogen receptor 100% positive, progesterone receptor 100% positive, both with strong staining intensity, HER2 negative, with an MIB-1 of 10%. She underwent MammoSite therapy between 03/18/2011 and 03/25/2011.  Antiestrogen therapy was discussed with the patient by my former partner Dr. Park Breed on 05/19/2011, with the patient declining.  Genetics testing for the BRCA 1 and 2 gene mutations was obtained 02/28/2011 and was negative.  She had routine screening mammography on 03/06/2018 showing a possible abnormality in the right breast. She proceeded to bilateral diagnostic mammography with tomography and right breast ultrasonography at [facility] on the same day showing: a 1.7 irregular, hypoechoic mass at the 11-12 o'clock areolar margin; one normal lymph node was seen.  Accordingly on 03/14/2018 she proceeded to biopsy of the right breast area in question. The pathology ([id number]) from this procedure showed: invasive ductal carcinoma, grade 2. Prognostic indicators significant for: estrogen receptor, 100% positive and progesterone receptor, 100% positive, both with strong staining intensity. Proliferation marker Ki67 at 10%. HER2 negative (1+) by immunohistochemistry.  Of note, she also has a personal history of uterine cancer status post total abdominal hysterectomy and bilateral salpingo-oophorectomy 06/11/2013 for an invasive endometrioid carcinoma of the uterus, grade 1, confined within the inner half of the endometrium.  A total of 3 left pelvic and 10 right pelvic lymph nodes were all negative for a pT1a pN0 stage.   She has also had multiple abscesses for skin cancer (basal cell and squamous cell).  The patient's subsequent  history is as detailed  below.   PAST MEDICAL HISTORY: Past Medical History:  Diagnosis Date   Abdominal aortic aneurysm (AAA) 3.0 cm to 5.0 cm in diameter in female 11/18/2016   Due for repeat in 12/2019.   Allergic rhinitis 05/26/2020   Aneurysm of infrarenal abdominal aorta 11/19/2013   2.6 cm in 2006 on CT.  CT 10/15 show 3 cm lesion.  [place]  2018 - no change   Arthritis    hips, lower back   Basal cell carcinoma 2011   R back   Bell's palsy 1984   Bilateral cold feet 03/28/2019   Blood transfusion without reported diagnosis 07/2015   Bradycardia    Cataract    Cholecystitis 2004   Chronic constipation 08/14/2018   Clotting disorder    Complication of anesthesia    COPD (chronic obstructive pulmonary disease)    Essential hypertension, benign 09/26/2007   GERD 11/03/2005   Hiatal hernia 2008   History of CVA (cerebrovascular accident) 09/24/2007   left cerebellar infarction, PICA territory   History of endometrial cancer 04/30/2013   uterine   History of GI bleed 07/04/2015   Hyperlipidemia 11/03/2005   LDL goal <100   Interstitial lung disease 03/28/2019   Iron deficiency anemia, unspecified    Macular degeneration    Malignant neoplasm of upper-outer quadrant of right breast in female, estrogen receptor positive 02/21/2011   Mild vascular neurocognitive disorder 05/14/2021   OSA (obstructive sleep apnea), Severe, AHI 61 04/28/2022   Osteoporosis 11/19/2014   DEXA 10/27/14.  Repeat in 2 yr.  High risk to start medication.    Polyp of colon 2008, 2014   Benign   PONV (postoperative nausea and vomiting)    Primary osteoarthritis of right knee 06/22/2015   Shingles 2008   Squamous cell skin cancer, face 03/22/2012   Actively seeing dermatology.   Tendonitis    rt hand   Venous stasis of both lower extremities 08/14/2018   Vocal cord polyp     PAST SURGICAL HISTORY: Past Surgical History:  Procedure Laterality Date   ABDOMINAL HYSTERECTOMY  2005   APPENDECTOMY     BASAL CELL  CARCINOMA EXCISION     biospy  10/28/2009   Shave Biospy skin Left hand(Acitinic Keratoses), Right Upper Arm (superficial Basal Cell), Right Upper Back(Superficial Basal Cell), Left Neck ( Solar Lentigo & Seborrheic Keratoses)   BREAST BIOPSY  02/11/2011   Right Breast Needle Core Biospy - Upper Outer Quadrant; ER/PR 100%, Her-2 Neu neg.; Ki-67 10%   BREAST LUMPECTOMY WITH RADIOACTIVE SEED LOCALIZATION Right 06/12/2018   Procedure: RIGHT BREAST LUMPECTOMY WITH RADIOACTIVE SEED LOCALIZATION;  Surgeon: Emelia Loron, MD;  Location: [facility];  Service: General;  Laterality: Right;   BREAST MAMMOSITE  03/14/2011   Procedure: MAMMOSITE BREAST;  Surgeon: Emelia Loron, MD;  Location: [facility];  Service: General;  Laterality: Right;   BREAST SURGERY     right breast lumpectomy snbx   CHOLECYSTECTOMY  2004   COLONOSCOPY     COLONOSCOPY W/ POLYPECTOMY     2008, 2014 (benign)   COLONOSCOPY WITH PROPOFOL N/A 07/09/2015   Procedure: COLONOSCOPY WITH PROPOFOL;  Surgeon: Graylin Shiver, MD;  Location: [facility];  Service: Endoscopy;  Laterality: N/A;   DILATION AND CURETTAGE OF UTERUS N/A 04/24/2013   Procedure: DILATATION AND CURETTAGE;  Surgeon: Lesly Dukes, MD;  Location: [facility];  Service: Gynecology;  Laterality: N/A;   ESOPHAGOGASTRODUODENOSCOPY N/A 07/06/2015   Procedure: ESOPHAGOGASTRODUODENOSCOPY (EGD);  Surgeon: Charlott Rakes, MD;  Location: [facility] END[facility]COPY;  Service: Endoscopy;  Laterality: N/A;   HYSTEROSCOPY N/A 04/24/2013   Procedure: HYSTEROSCOPY;  Surgeon: Lesly Dukes, MD;  Location: [facility];  Service: Gynecology;  Laterality: N/A;   ROBOTIC ASSISTED TOTAL HYSTERECTOMY WITH BILATERAL SALPINGO OOPHERECTOMY Bilateral 06/11/2013   Procedure: ROBOTIC ASSISTED TOTAL HYSTERECTOMY WITH BILATERAL SALPINGO OOPHORECTOMY WITH POSSIBLE STAGING, EPISIOTOMY;  Surgeon: Laurette Schimke, MD;  Location: [facility];  Service: Gynecology;  Laterality: Bilateral;    SQUAMOUS CELL CARCINOMA EXCISION     TEAR DUCT PROBING     TONSILLECTOMY     vocal cord poylps  1969   excision    FAMILY HISTORY Family History  Problem Relation Age of Onset   Heart disease Father    Cancer Sister 74       Lymphoma   Breast cancer Sister    Cancer Paternal Aunt        Breast cancer   Cancer Daughter 68       Bilateral Breast Cancer   Breast cancer Daughter 105   Cancer Daughter        Breast Cancer   Breast cancer Daughter    Cancer Sister        2005 - Renal Cell Cancer and Breast Cancer   Breast cancer Sister   Patient father was 29 years old when he died from heart issues. Patient mother died from heart issues at age 83. Patient has 3 siblings, 1 brother and 2 sisters.  Patient notes a family hx of breast cancer in both sisters and both daughters. Patient's daughters have had extensive genetics evaluation with no deleterious mutation identified.    GYNECOLOGIC HISTORY:  No LMP recorded. Patient has had a hysterectomy. Menarche: 89 years old Age at first live birth: 89 years old GX P 2 LMP 44, roughly 89 years old Contraceptive unknown HRT? No Hysterectomy? Yes, 06/11/2013 BSO? 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  Her husband Reita Cliche was a IT sales professional.  He died 27-Jan-2020.  They have 2 daughters, [name] and [place]. The patient attends [employer]. She and her husband share 2 grandchildren and 4 great-grandchildren.    ADVANCED DIRECTIVES: Not in place   HEALTH MAINTENANCE: Social History   Tobacco Use   Smoking status: Former    Current packs/day: 0.00    Average packs/day: 1 pack/day for 30.0 years (30.0 ttl pk-yrs)    Types: Cigarettes    Start date: 01/31/1966    Quit date: 02/01/1996    Years since quitting: 27.2   Smokeless tobacco: Never  Vaping Use   Vaping status: Never Used  Substance Use Topics   Alcohol use: Not Currently   Drug use: No      Colonoscopy: 07/09/2015, negative, Dr. Evette Cristal  PAP:  Bone density: Due May 2023   Allergies  Allergen Reactions   Benadryl [Diphenhydramine Hcl]     Hyper   Doxycycline Other (See Comments)    acid reflux   Simvastatin Other (See Comments)    REACTION: memory loss   Fosamax [Alendronate Sodium] Other (See Comments)    GERD   Lisinopril Other (See Comments)    Dry cough   Nsaids Other (See Comments)    GI Bleed    Current Outpatient Medications  Medication Sig Dispense Refill   acetaminophen (TYLENOL) 325 MG tablet Take 650 mg by mouth as needed for  mild pain.      albuterol (VENTOLIN HFA) 108 (90 Base) MCG/ACT inhaler Inhale 2 puffs into the lungs every 6 (six) hours as needed for wheezing or shortness of breath. 18 g 3   AMBULATORY NON FORMULARY MEDICATION Medication Name: Set CPAP to 8-11 mm HG water pressure. Please send down load after 4 weeks of use to [id number]. 1 Units PRN   anastrozole (ARIMIDEX) 1 MG tablet TAKE 1 TABLET BY MOUTH DAILY 100 tablet 2   atorvastatin (LIPITOR) 20 MG tablet TAKE 1 TABLET BY MOUTH EVERY  OTHER NIGHT 50 tablet 2   Azelastine HCl 137 MCG/SPRAY SOLN Place 2 sprays into both nostrils 2 (two) times daily.     cholecalciferol (VITAMIN D3) 25 MCG (1000 UT) tablet Take 1,000 Units by mouth daily.     clopidogrel (PLAVIX) 75 MG tablet TAKE 1 TABLET BY MOUTH DAILY 100 tablet 2   diclofenac Sodium (VOLTAREN) 1 % GEL Apply 2 g topically 4 (four) times daily. 100 g 1   famotidine (PEPCID) 20 MG tablet TAKE 1 TABLET BY MOUTH TWICE  DAILY 200 tablet 2   loratadine (CLARITIN) 10 MG tablet Take 1 tablet (10 mg total) by mouth daily. 90 tablet 3   losartan (COZAAR) 25 MG tablet TAKE 1 TABLET BY MOUTH DAILY 100 tablet 2   memantine (NAMENDA) 10 MG tablet Take 1 tablet (10 mg total) by mouth 2 (two) times daily. 180 tablet 3   Multiple Vitamins-Minerals (ICAPS AREDS 2 PO) Take 1 capsule by mouth 2 (two) times daily at 10 AM and 5 PM. Morning and evening      Spacer/Aero-Holding Chambers (PRO COMFORT SPACER ADULT) MISC Use with Albuterol inhaler. 1 each 0   VIACTIV 500-500-40 MG-UNT-MCG CHEW CHEW 1 TABLET BY MOUTH EVERY MORNING.  12   No current facility-administered medications for this visit.    OBJECTIVE:  white woman who appears stated age  Vitals:   04/20/23 1346  BP: (!) 130/59  Pulse: 83  Resp: 16  Temp: 97.9 F (36.6 C)  SpO2: 97%     Body mass index is 29.18 kg/m.   Wt Readings from Last 3 Encounters:  04/20/23 186 lb 4.8 oz (84.5 kg)  03/28/23 186 lb (84.4 kg)  03/24/23 186 lb (84.4 kg)      ECOG FS:1 - Symptomatic but completely ambulatory  Physical Exam Constitutional:      Appearance: Normal appearance.  Chest:     Comments: Bilateral breast examined.  No palpable masses or regional adenopathy.  She has postop seroma on the right side which is chronic since surgery.  No change Musculoskeletal:     Cervical back: Normal range of motion and neck supple. No rigidity.  Neurological:     Mental Status: She is alert.       LAB RESULTS:  CMP     Component Value Date/Time   NA 140 08/25/2022 1422   K 4.4 08/25/2022 1422   CL 103 08/25/2022 1422   CO2 25 08/25/2022 1422   GLUCOSE 105 (H) 08/25/2022 1422   GLUCOSE 96 03/04/2022 1110   BUN 10 08/25/2022 1422   BUN 12.3 11/01/2013 1031   CREATININE 0.95 08/25/2022 1422   CREATININE 0.90 03/04/2022 1110   CREATININE 0.9 11/01/2013 1031   CALCIUM 9.7 08/25/2022 1422   PROT 6.6 08/25/2022 1422   ALBUMIN 4.1 08/25/2022 1422   AST 19 08/25/2022 1422   AST 16 07/20/2021 1418   ALT 9 08/25/2022 1422   ALT 8  07/20/2021 1418   ALKPHOS 116 08/25/2022 1422   BILITOT 0.3 08/25/2022 1422   BILITOT 0.4 07/20/2021 1418   GFRNONAA 55 (L) 07/20/2021 1418   GFRNONAA 52 (L) 11/26/2019 1516   GFRAA 61 11/26/2019 1516    No results found for: "TOTALPROTELP", "ALBUMINELP", "A1GS", "A2GS", "BETS", "BETA2SER", "GAMS", "MSPIKE", "SPEI"  No results found for: "KPAFRELGTCHN",  "LAMBDASER", "KAPLAMBRATIO"  Lab Results  Component Value Date   WBC 8.1 08/25/2022   NEUTROABS 5.3 07/20/2021   HGB 12.5 08/25/2022   HCT 37.6 08/25/2022   MCV 99 (H) 08/25/2022   PLT 225 08/25/2022   Lab Results  Component Value Date   LABCA2 77 (H) 03/08/2011    No components found for: "QMVHQI696"  No results for input(s): "INR" in the last 168 hours.  Lab Results  Component Value Date   LABCA2 6 (H) 03/08/2011    No results found for: "EXB284"  No results found for: "CAN125"  No results found for: "CAN153"  No results found for: "CA2729"  No components found for: "HGQUANT"  No results found for: "CEA1", "CEA" / No results found for: "CEA1", "CEA"   No results found for: "AFPTUMOR"  No results found for: "CHROMOGRNA"  No results found for: "HGBA", "HGBA2QUANT", "HGBFQUANT", "HGBSQUAN" (Hemoglobinopathy evaluation)   Lab Results  Component Value Date   LDH 131 07/04/2015    Lab Results  Component Value Date   IRON 179 (H) 07/04/2015   TIBC 249 (L) 07/04/2015   IRONPCTSAT 72 (H) 07/04/2015   (Iron and TIBC)  Lab Results  Component Value Date   FERRITIN 41 03/04/2022    Urinalysis    Component Value Date/Time   COLORURINE YELLOW 10/21/2013 1645   APPEARANCEUR CLEAR 10/21/2013 1645   LABSPEC 1.013 10/21/2013 1645   PHURINE 5.5 10/21/2013 1645   GLUCOSEU NEG 10/21/2013 1645   HGBUR NEG 10/21/2013 1645   BILIRUBINUR NEG 10/21/2013 1645   KETONESUR NEG 10/21/2013 1645   PROTEINUR NEG 10/21/2013 1645   UROBILINOGEN 0.2 10/21/2013 1645   NITRITE NEG 10/21/2013 1645   LEUKOCYTESUR TRACE (A) 10/21/2013 1645    STUDIES: No results found.    ELIGIBLE FOR AVAILABLE RESEARCH PROTOCOL: no   ASSESSMENT: 89 y.o. [place] woman  (1) status post right upper outer quadrant lumpectomy 03/14/2011 for a pT1b (0.52 cm), pN0 invasive ductal carcinoma, grade 1, estrogen and progesterone receptor positive, HER-2 nonamplified, with an MIB-1 of 10%; a  total of 2 axillary lymph nodes were removed.  (a) status post MammoSite radiation between 03/17/2018 and 03/24/2018  (b) opted against adjuvant antiestrogens  (2) status post total abdominal hysterectomy and bilateral salpingo-oophorectomy 06/11/2013 for a pT1a pN0 invasive endometrioid carcinoma, FIGO grade 1, a total of 3 left and 10 right pelvic lymph nodes all clear  (3) status post right breast upper outer quadrant biopsy 03/14/2018 for a clinical T1c N0 invasive ductal carcinoma, grade 2, strongly estrogen and progesterone receptor positive, HER-2 nonamplified, with an MIB-1 of 10%.  (4) status post right lumpectomy 06/12/2018 for a pT1c pNX invasive ductal carcinoma, grade 2, with the final resection margins negative   (5) anastrozole started 04/01/2018  (a) bone density 12/04/2018 shows a T score of -2.0  (6) opted against adjuvant radiation   PLAN:  Breast cancer She can discontinue her breast cancer medication after completing the current supply, as the recommended treatment duration is complete. - Discontinue breast cancer medication after finishing the current supply.  Cognitive impairment Memantine has been effective in managing her mild  cognitive impairment. She remains capable of managing daily activities. Under neurologist care for further testing. - Continue memantine. - Follow up with neurologist for further testing.  Follow-up She has the option to return if needed or schedule a tentative appointment for a year from now, with the option to cancel. - Schedule a tentative follow-up appointment in one year.  Rachel Moulds, MD   04/20/2023 2:10 PM Medical Oncology and Hematology [facility] [address] [place], [place] [address] Tel. [phone]    Fax. [phone]    *Total Encounter Time as defined by the Centers for Medicare and Medicaid Services includes, in addition to the face-to-face time of a patient visit (documented in the note  above) non-face-to-face time: obtaining and reviewing outside history, ordering and reviewing medications, tests or procedures, care coordination (communications with other health care professionals or caregivers) and documentation in the medical record.

## 2023-04-21 ENCOUNTER — Ambulatory Visit: Payer: Medicare Other | Admitting: Family Medicine

## 2023-05-04 ENCOUNTER — Encounter: Payer: Self-pay | Admitting: Family Medicine

## 2023-05-04 ENCOUNTER — Other Ambulatory Visit (HOSPITAL_COMMUNITY)
Admission: RE | Admit: 2023-05-04 | Discharge: 2023-05-04 | Disposition: A | Discharge status: Home / Self Care | Source: Ambulatory Visit | Attending: Family Medicine | Admitting: Family Medicine

## 2023-05-04 ENCOUNTER — Ambulatory Visit: Payer: Medicare Other | Admitting: Family Medicine

## 2023-05-04 VITALS — BP 120/80 | Ht 67.0 in | Wt 186.0 lb

## 2023-05-04 DIAGNOSIS — L821 Other seborrheic keratosis: Secondary | ICD-10-CM | POA: Diagnosis not present

## 2023-05-04 DIAGNOSIS — L989 Disorder of the skin and subcutaneous tissue, unspecified: Secondary | ICD-10-CM | POA: Diagnosis not present

## 2023-05-04 DIAGNOSIS — D0462 Carcinoma in situ of skin of left upper limb, including shoulder: Secondary | ICD-10-CM | POA: Diagnosis not present

## 2023-05-04 DIAGNOSIS — L570 Actinic keratosis: Secondary | ICD-10-CM | POA: Diagnosis not present

## 2023-05-04 MED ORDER — LIDOCAINE HCL 1 % IJ SOLN
2.0000 mL | Freq: Once | INTRAMUSCULAR | Status: AC
  Administered 2023-05-04: 2 mL

## 2023-05-04 NOTE — Patient Instructions (Addendum)
 Ok to remove the dressing tomorrow afternoon, if it bleeds through you can change it sooner but otherwise change it tomorrow afternoon. Okay to get the area wet in the shower okay to get soap on it and let the water rinse over it but do not scrub at the area. After your shower pat dry apply a dab of Vaseline and cover again for another 24 hours. Keep it covered today, Friday, Saturday, and then Sunday afternoon you can remove the bandage and just keep a dab of Vaseline on it to keep it moisturized.  If you are going to do any activity where you think it could get dirt in it then you could cover it loosely with a Band-Aid if needed.

## 2023-05-04 NOTE — Progress Notes (Signed)
 Established Patient Office Visit  Subjective  Patient ID: Michelle [name], female    DOB: July 31, 1932  Age: 89 y.o. MRN: [id number]  Chief Complaint  Patient presents with   skin lesion    removal    HPI  He is here today to have skin lesion removed off of her left forearm.  It is an elevated crusty lesion that has been there for quite some time she says it actually has gotten a little bit smaller.  She also has a skin lesion on her right temple that she would like to have evaluated today she had it frozen several months ago but it never completely peeled off.  She also wanted to ask about exercises for her bladder so we did discuss Kegel exercises.  ROS    Objective:     BP 120/80 (BP Location: Left Arm, Patient Position: Sitting, Cuff Size: Normal)   Ht 5\' 7"  (1.702 m)   Wt 186 lb (84.4 kg)   SpO2 99%   BMI 29.13 kg/m     Physical Exam Skin:    Comments: On the left forearm just distal to the antecubital fossa there is a raised keratotic area measuring approximately 3 cm.  The skin around it has a little bit more pale appearance.  No active drainage.  On her right temple she has a rough textured brown seborrheic keratoses.      No results found for any visits on 05/04/23.     The ASCVD Risk score (Arnett DK, et al., 2019) failed to calculate for the following reasons:   The 2019 ASCVD risk score is only valid for ages 61 to 73   Risk score cannot be calculated because patient has a medical history suggesting prior/existing ASCVD    Assessment & Plan:   Problem List Items Addressed This Visit   None Visit Diagnoses       Encounter for removal of skin lesion    -  Primary   Relevant Medications   lidocaine (XYLOCAINE) 1 % (with pres) injection 2 mL (Completed)   Other Relevant Orders   Surgical pathology     Seborrheic keratosis           Seborrheic keratosis-discussed treatment with cryotherapy she had it frozen several months ago but it never  completely peeled off recommend repeat freezing today for more definitive treatment.  Cryotherapy Procedure Note  Pre-operative Diagnosis: Seborrheic keratosis  Post-operative Diagnosis: Same  Locations: Temple, righ   Indications: irritation   Procedure Details  Patient informed of risks (permanent scarring, infection, light or dark discoloration, bleeding, infection, weakness, numbness and recurrence of the lesion) and benefits of the procedure and verbal informed consent obtained.  The areas are treated with liquid nitrogen therapy, frozen until ice ball extended 1 mm beyond lesion, allowed to thaw, and treated again. The patient tolerated procedure well.  The patient was instructed on post-op care, warned that there may be blister formation, redness and pain. Recommend OTC analgesia as needed for pain.  Condition: Stable  Complications: none.  Plan: 1. Instructed to keep the area dry and covered for 24-48h and clean thereafter. 2. Warning signs of infection were reviewed.   3. Recommended that the patient use OTC acetaminophen as needed for pain.  4. Return PRN    Shave Biopsy Procedure Note  Pre-operative Diagnosis: Suspicious lesion  Post-operative Diagnosis: same  Locations: Left forearm distal to the antecubital fossa.  Indications: Worrisome for squamous cell  Anesthesia: 1% lidocaine with  epi  Procedure Details   Patient informed of the risks (including bleeding and infection) and benefits of the  procedure and Verbal informed consent obtained.  The lesion and surrounding area were given a sterile prep using alcohol  A flat blade was used to shave an area of skin approximately 1cm by 1cm.  Hemostasis achieved with alumuninum chloride. Antibiotic ointment and a sterile dressing applied.  The specimen was sent for pathologic examination. The patient tolerated the procedure well.  EBL: trace   Findings: Await pathology    Condition: Stable  Complications: none.  Plan: 1. Instructed to keep the wound dry and covered for 24-48h and clean thereafter. 2. Warning signs of infection were reviewed.   3. Recommended that the patient use OTC acetaminophen as needed for pain.  4. Return PRN.   No follow-ups on file.    Wound care discussed call if any concerns or problems or any concerns about wound healing.  Nani Gasser, MD

## 2023-05-08 ENCOUNTER — Encounter: Payer: Self-pay | Admitting: Family Medicine

## 2023-05-08 ENCOUNTER — Other Ambulatory Visit: Payer: Self-pay | Admitting: Family Medicine

## 2023-05-08 DIAGNOSIS — D099 Carcinoma in situ, unspecified: Secondary | ICD-10-CM

## 2023-05-08 LAB — SURGICAL PATHOLOGY

## 2023-05-08 NOTE — Progress Notes (Signed)
Order placed for dermatology

## 2023-05-08 NOTE — Progress Notes (Signed)
 Hi Michelle [name], I just wanted to let you know that the biopsy did come back positive for a skin cancer called squamous cell.  It does extend to the margins of the biopsy that we did.  So we need to get you in with dermatology to do a little bit further excision.  Please let me know if you have a preference for provider or location in regards to a dermatologist and will be happy to place that referral.

## 2023-05-11 DIAGNOSIS — D0462 Carcinoma in situ of skin of left upper limb, including shoulder: Secondary | ICD-10-CM | POA: Diagnosis not present

## 2023-05-17 ENCOUNTER — Encounter: Payer: Self-pay | Admitting: Psychology

## 2023-05-18 ENCOUNTER — Ambulatory Visit: Payer: Medicare Other | Admitting: Psychology

## 2023-05-18 ENCOUNTER — Ambulatory Visit: Payer: Self-pay | Admitting: Psychology

## 2023-05-18 ENCOUNTER — Encounter: Payer: Self-pay | Admitting: Psychology

## 2023-05-18 DIAGNOSIS — I679 Cerebrovascular disease, unspecified: Secondary | ICD-10-CM | POA: Diagnosis not present

## 2023-05-18 DIAGNOSIS — Z8673 Personal history of transient ischemic attack (TIA), and cerebral infarction without residual deficits: Secondary | ICD-10-CM

## 2023-05-18 DIAGNOSIS — F0670 Mild neurocognitive disorder due to known physiological condition without behavioral disturbance: Secondary | ICD-10-CM | POA: Diagnosis not present

## 2023-05-18 DIAGNOSIS — I999 Unspecified disorder of circulatory system: Secondary | ICD-10-CM

## 2023-05-18 DIAGNOSIS — R4189 Other symptoms and signs involving cognitive functions and awareness: Secondary | ICD-10-CM

## 2023-05-18 NOTE — Progress Notes (Signed)
   Psychometrician Note   Cognitive testing was administered to Michelle [name] by Arnulfo Larch, B.S. (psychometrist) under the supervision of Dr. Melville Stade, Ph.D., ABPP, licensed psychologist on 05/18/2023. Michelle [name] did not appear overtly distressed by the testing session per behavioral observation or responses across self-report questionnaires. Rest breaks were offered.    The battery of tests administered was selected by Dr. Zachary C. Merz, Ph.D., ABPP with consideration to Michelle [name]'s current level of functioning, the nature of her symptoms, emotional and behavioral responses during interview, level of literacy, observed level of motivation/effort, and the nature of the referral question. This battery was communicated to the psychometrist. Communication between Dr. Melville Stade, Ph.D., ABPP and the psychometrist was ongoing throughout the evaluation and Dr. Melville Stade, Ph.D., ABPP was immediately accessible at all times. Dr. Zachary C. Merz, Ph.D., ABPP provided supervision to the psychometrist on the date of this service to the extent necessary to assure the quality of all services provided.    Michelle [name] will return within approximately 1-2 weeks for an interactive feedback session with Dr. Kitty Perkins at which time her test performances, clinical impressions, and treatment recommendations will be reviewed in detail. Michelle [name] understands she can contact our office should she require our assistance before this time.  A total of 125 minutes of billable time were spent face-to-face with Michelle [name] by the psychometrist. This includes both test administration and scoring time. Billing for these services is reflected in the clinical report generated by Dr. Melville Stade, Ph.D., ABPP  This note reflects time spent with the psychometrician and does not include test scores or any clinical interpretations made by Dr. Kitty Perkins. The full report will follow in a separate note.

## 2023-05-18 NOTE — Progress Notes (Signed)
 NEUROPSYCHOLOGICAL EVALUATION [name] Department of Neurology  Date of Evaluation: May 18, 2023  Reason for Referral:   Michelle [name] is a 89 y.o. right-handed Caucasian female referred by Tex Filbert, PA-C, to characterize her current cognitive functioning and assist with diagnostic clarity and treatment planning in the context of a previously diagnosed mild vascular neurocognitive disorder and concern for progressive cognitive decline.   Assessment and Plan:   Clinical Impression(s): Michelle [name] pattern of performance is suggestive of an isolated impairment surrounding semantic fluency. Further performance variability was exhibited across confrontation naming and delayed retrieval aspects of memory. While she exhibited an isolated weakness across a line orientation task, all other visuospatial tasks were appropriate. Performances were also appropriate relative to age-matched peers across processing speed, attention/concentration, cognitive flexibility, safety/judgment, receptive language, phonemic fluency, writing samples, encoding (i.e., learning) aspects of memory, and recognition/consolidation aspects of memory. Functionally, Michelle [name] largely denied difficulties completing instrumental activities of daily living (ADLs) independently. As such, given evidence for cognitive dysfunction described above, she continues to best meet diagnostic criteria for a Mild Neurocognitive Disorder ("mild cognitive impairment") at the present time.  Relative to her previous evaluation in April 2023, her sharpest decline surrounded significant trouble recalling a previously drawn complex figure after a brief delay. More mild declines were exhibited across attention/concentration, semantic fluency, and encoding aspects of memory. However, in several of these cases, current performances still remained normatively appropriate. Variability was exhibited across confrontation naming  where one task suggested mild decline and another suggested mild improvement. Other assessed domains exhibited stability over time.   Regarding etiology, I continue to favor a primary vascular cause (i.e., mild vascular neurocognitive disorder) at the present time. Prior neuroimaging has suggested moderate to extensive cerebrovascular disease that has progressed over time and a remote left cerebellar infarct. As this scan was performed in 2022, there remains the potential that further progression has occurred over the past several years which could account for some decline across testing.  Specific to memory, delayed retrieval rates were 0% and 10% across list and figure-based tasks respectively. However, she retained 70% of previously learned story-based information after a delay and performances were normatively appropriate across recognition trials across all three memory tasks. Overall, while some concern for rapid forgetting is reasonable, this is not a consistent finding and she does not exhibit any memory storage abnormalities presently. As such, she does not exhibit a classic testing pattern suggestive of underlying Alzheimer's disease. While the very early stages of this illness cannot be ruled out, its presence cannot be ruled in with any degree of confidence based upon current testing patterns. Language dysfunction was generally not progressive in nature, lessening prior discussions surrounding a primary progressive aphasia presentation.   Recommendations: A repeat brain MRI could be considered to better understand the progressive nature of cerebrovascular disease given that her last scan appears to have been performed in 2022.   Limiting progressive vascular disease can be assisted by ensuring that medical ailments are managed appropriately, medications are taken accurately, and that she engage in good health habits. Michelle [name] is encouraged to attend to lifestyle factors for brain health (e.g.,  regular physical exercise, good nutrition habits [i.e., heart healthy diet], regular participation in cognitively-stimulating activities, and general stress management techniques), which are likely to have benefits for both emotional adjustment and cognition. Continued participation in activities which provide mental stimulation and social interaction is also recommended.   Performance across neurocognitive testing is not  a strong predictor of an individual's safety operating a motor vehicle. Should her family wish to pursue a formalized driving evaluation, they could reach out to the following agencies: [employer] in [place]: [phone] [facility]: [phone] [facility]: [phone] [facility]: [phone] or [phone]  Should there be progression of current deficits over time, Michelle [name] is unlikely to regain any independent living skills lost. Therefore, it is recommended that she remain as involved as possible in all aspects of household chores, finances, and medication management, with supervision to ensure adequate performance. She will likely benefit from the establishment and maintenance of a routine in order to maximize her functional abilities over time.  It will be important for Michelle [name] to have another person with her when in situations where she may need to process information, weigh the pros and cons of different options, and make decisions, in order to ensure that she fully understands and recalls all information to be considered.  If not already done, Michelle [name] and her family may want to discuss her wishes regarding durable power of attorney and medical decision making, so that she can have input into these choices. If they require legal assistance with this, long-term care resource access, or other aspects of estate planning, they could reach out to [employer] at [phone] for a free consultation.  Important  information should be provided to Michelle [name] in written format in all instances. This information should be placed in a highly frequented and easily visible location within her home to promote recall. External strategies such as written notes in a consistently used memory journal, visual and nonverbal auditory cues such as a calendar on the refrigerator or appointments with alarm, such as on a cell phone, can also help maximize recall.  When learning new information, she would benefit from information being broken up into small, manageable pieces. She may also find it helpful to articulate the material in her own words and in a context to promote encoding at the onset of a new task. This material may need to be repeated multiple times to promote encoding.  Because she shows better recall for structured information, she will likely understand and retain new information better if it is presented to her in a meaningful or well-organized manner at the outset, such as grouping items into meaningful categories or presenting information in an outlined, bulleted, or story format.  To address problems with processing speed, she may wish to consider:   -Ensuring that she is alerted when essential material or instructions are being presented   -Adjusting the speed at which new information is presented   -Allowing for more time in comprehending, processing, and responding in conversation   -Repeating and paraphrasing instructions or conversations aloud  To address problems with fluctuating attention and/or executive dysfunction, she may wish to consider:   -Avoiding external distractions when needing to concentrate   -Limiting exposure to fast paced environments with multiple sensory demands   -Writing down complicated information and using checklists   -Attempting and completing one task at a time (i.e., no multi-tasking)   -Verbalizing aloud each step of a task to maintain focus   -Taking frequent breaks  during the completion of steps/tasks to avoid fatigue   -Reducing the amount of information considered at one time   -Scheduling more difficult activities for a time of day where she is usually most alert  Review of Records:   Ms. Stelzer was seen by [facility] Abraham Hoffmann  Wertman, PA-C) on 08/27/2020 for memory loss. Concerns surrounding memory were reported to be present since her 2009 left cerebellar infarct. Her daughter reported that she had been asking repetitive questions and telling the same stories for at least the past year. Cognitive changes were said to have been notably exacerbated following the passing of her husband in December 2021. ADLs were described as intact outside of driving. She reported recent driving activity but her "daughter took the keys of the car because she is afraid I am going to get into an accident." She denied REM behaviors, hallucinations, paranoia, movement difficulties, or prominent mood concerns currently. Performance on a brief cognitive screening instrument (MOCA) was 24/30. Ultimately, Ms. Sciara was referred for a comprehensive neuropsychological evaluation to characterize her cognitive abilities and to assist with diagnostic clarity and treatment planning.    She completed a comprehensive neuropsychological evaluation with myself on 05/14/2021. Results suggested weaknesses in expressive language (e.g., word finding and articulating herself quickly and clearly) and information retrieval. This was evidenced by primary deficits across verbal fluency and confrontation naming tasks, as well as delayed recall across memory tasks (with retention scores ranging from 0% to 56%). She did exhibit an isolated impairment across a line orientation task; however, this could have been influenced by her history of macular degeneration and other constructional tasks were appropriate. A primary vascular etiology appeared most likely at that time. Testing patterns were not suggestive of  Alzheimer's disease. She denied prominent ADL dysfunction and was diagnosed with a mild neurocognitive disorder.  Ms. Depaula most recently met with Ms. Wertman for follow-up on 01/10/2023. At that time, cognition was noted to be stable. No change in ADL functioning was reported. She continues on memantine . Performance on a brief cognitive screening instrument (MMSE) performed in June 2024 was 30/30. Ultimately, Ms. Hessler was referred for a repeat neuropsychological evaluation to characterize her cognitive abilities and to assist with diagnostic clarity and treatment planning.   Neuroimaging: Brain MRI on 09/12/2007 revealed an acute left cerebellar infarct in the PICA territory, as well as chronic small vessel disease. Brain MRI on 06/17/2020 revealed moderate to extensive chronic microvascular ischemic changes with progression since 2009, a remote small left cerebellar infarct, and mild generalized volume loss.   Past Medical History:  Diagnosis Date   Abdominal aortic aneurysm (AAA) 3.0 cm to 5.0 cm in diameter in female 11/18/2016   Due for repeat in 12/2019.   Abnormal hemoglobin 08/25/2022   Allergic rhinitis 05/26/2020   Aneurysm of infrarenal abdominal aorta 11/19/2013   2.6 cm in 2006 on CT.   CT 10/15 show 3 cm lesion.   US   2018 - no change  CT 2020 - 2.9 cm, stable      Arthritis    hips, lower back   Basal cell carcinoma 2011   R back   Bell's palsy 1984   Bilateral cold feet 03/28/2019   Blood transfusion without reported diagnosis 07/2015   Bradycardia    Cataract    Cholecystitis 2004   Chronic constipation 08/14/2018   Clotting disorder    Complication of anesthesia    COPD (chronic obstructive pulmonary disease)    Essential hypertension, benign 09/26/2007   Genetic testing 09/16/2021   Negative hereditary cancer genetic testing: no pathogenic variants detected in CancerNext-Expanded +RNAinsight Panel.  Report date is September 06, 2021.      The CancerNext-Expanded gene  panel offered by [employer] and includes sequencing, rearrangement, and RNA analysis for the following 77 genes:  AIP, ALK, APC, ATM, AXIN2, BAP1, BARD1, BLM, BMPR1A, BRCA1, BRCA2, BRIP1, CDC73, CDH1, CDK4, CDKN1B   GERD 11/03/2005   Hiatal hernia 2008   History of CVA (cerebrovascular accident) 09/24/2007   left cerebellar infarction, PICA territory   History of endometrial cancer 04/30/2013   uterine   History of GI bleed 07/04/2015   Hyperlipidemia 11/03/2005   LDL goal <100   Interstitial lung disease 03/28/2019   Iron deficiency anemia, unspecified    Low serum vitamin B12 08/26/2021   Lymphedema of both lower extremities 08/25/2022   Macular degeneration    Malignant neoplasm of upper-outer quadrant of right breast in female, estrogen receptor positive 02/21/2011   Mild vascular neurocognitive disorder 05/14/2021   OSA (obstructive sleep apnea) 04/28/2022   04/2022: HST, AHI 61.9/hr, no central apneas.  Set to 8 cm water pressure based on AutoPap results.     Osteoporosis 11/19/2014   DEXA 10/27/14.  Repeat in 2 yr.  High risk to start medication.    Polyp of colon 2008, 2014   Benign   PONV (postoperative nausea and vomiting)    Primary osteoarthritis of right knee 06/22/2015   Senile purpura 10/12/2022   Shingles 2008   Squamous cell skin cancer, face 03/22/2012   Actively seeing dermatology.   Tendonitis    rt hand   Venous stasis of both lower extremities 08/14/2018   Vocal cord polyp     Past Surgical History:  Procedure Laterality Date   ABDOMINAL HYSTERECTOMY  2005   APPENDECTOMY     BASAL CELL CARCINOMA EXCISION     biospy  10/28/2009   Shave Biospy skin Left hand(Acitinic Keratoses), Right Upper Arm (superficial Basal Cell), Right Upper Back(Superficial Basal Cell), Left Neck ( Solar Lentigo & Seborrheic Keratoses)   BREAST BIOPSY  02/11/2011   Right Breast Needle Core Biospy - Upper Outer Quadrant; ER/PR 100%, Her-2 Neu neg.; Ki-67 10%   BREAST LUMPECTOMY  WITH RADIOACTIVE SEED LOCALIZATION Right 06/12/2018   Procedure: RIGHT BREAST LUMPECTOMY WITH RADIOACTIVE SEED LOCALIZATION;  Surgeon: Enid Harry, MD;  Location: [facility];  Service: General;  Laterality: Right;   BREAST MAMMOSITE  03/14/2011   Procedure: MAMMOSITE BREAST;  Surgeon: Enid Harry, MD;  Location:  SURGERY CENTER;  Service: General;  Laterality: Right;   BREAST SURGERY     right breast lumpectomy snbx   CHOLECYSTECTOMY  2004   COLONOSCOPY     COLONOSCOPY W/ POLYPECTOMY     2008, 2014 (benign)   COLONOSCOPY WITH PROPOFOL  N/A 07/09/2015   Procedure: COLONOSCOPY WITH PROPOFOL ;  Surgeon: Celedonio Coil, MD;  Location: [facility];  Service: Endoscopy;  Laterality: N/A;   DILATION AND CURETTAGE OF UTERUS N/A 04/24/2013   Procedure: DILATATION AND CURETTAGE;  Surgeon: Rik Chasten, MD;  Location: [facility];  Service: Gynecology;  Laterality: N/A;   ESOPHAGOGASTRODUODENOSCOPY N/A 07/06/2015   Procedure: ESOPHAGOGASTRODUODENOSCOPY (EGD);  Surgeon: Baldo Bonds, MD;  Location: [facility];  Service: Endoscopy;  Laterality: N/A;   HYSTEROSCOPY N/A 04/24/2013   Procedure: HYSTEROSCOPY;  Surgeon: Rik Chasten, MD;  Location: [facility];  Service: Gynecology;  Laterality: N/A;   ROBOTIC ASSISTED TOTAL HYSTERECTOMY WITH BILATERAL SALPINGO OOPHERECTOMY Bilateral 06/11/2013   Procedure: ROBOTIC ASSISTED TOTAL HYSTERECTOMY WITH BILATERAL SALPINGO OOPHORECTOMY WITH POSSIBLE STAGING, EPISIOTOMY;  Surgeon: Terry Ficks, MD;  Location: [facility];  Service: Gynecology;  Laterality: Bilateral;   SQUAMOUS CELL CARCINOMA EXCISION     TEAR DUCT PROBING     TONSILLECTOMY  vocal cord poylps  1969   excision    Current Outpatient Medications:    acetaminophen  (TYLENOL ) 325 MG tablet, Take 650 mg by mouth as needed for mild pain. , Disp: , Rfl:    albuterol  (VENTOLIN  HFA) 108 (90 Base) MCG/ACT inhaler, Inhale 2 puffs into the lungs every 6 (six) hours as  needed for wheezing or shortness of breath., Disp: 18 g, Rfl: 3   AMBULATORY NON FORMULARY MEDICATION, Medication Name: Set CPAP to 8-11 mm HG water pressure. Please send down load after 4 weeks of use to [phone]., Disp: 1 Units, Rfl: PRN   anastrozole  (ARIMIDEX ) 1 MG tablet, TAKE 1 TABLET BY MOUTH DAILY, Disp: 100 tablet, Rfl: 2   atorvastatin  (LIPITOR) 20 MG tablet, TAKE 1 TABLET BY MOUTH EVERY  OTHER NIGHT, Disp: 50 tablet, Rfl: 2   Azelastine HCl 137 MCG/SPRAY SOLN, Place 2 sprays into both nostrils 2 (two) times daily., Disp: , Rfl:    cholecalciferol (VITAMIN D3) 25 MCG (1000 UT) tablet, Take 1,000 Units by mouth daily., Disp: , Rfl:    clopidogrel  (PLAVIX ) 75 MG tablet, TAKE 1 TABLET BY MOUTH DAILY, Disp: 100 tablet, Rfl: 2   diclofenac  Sodium (VOLTAREN ) 1 % GEL, Apply 2 g topically 4 (four) times daily., Disp: 100 g, Rfl: 1   famotidine  (PEPCID ) 20 MG tablet, TAKE 1 TABLET BY MOUTH TWICE  DAILY, Disp: 200 tablet, Rfl: 2   loratadine  (CLARITIN ) 10 MG tablet, Take 1 tablet (10 mg total) by mouth daily., Disp: 90 tablet, Rfl: 3   losartan  (COZAAR ) 25 MG tablet, TAKE 1 TABLET BY MOUTH DAILY, Disp: 100 tablet, Rfl: 2   memantine  (NAMENDA ) 10 MG tablet, Take 1 tablet (10 mg total) by mouth 2 (two) times daily., Disp: 180 tablet, Rfl: 3   Multiple Vitamins-Minerals (ICAPS AREDS 2 PO), Take 1 capsule by mouth 2 (two) times daily at 10 AM and 5 PM. Morning and evening, Disp: , Rfl:    Spacer/Aero-Holding Chambers (PRO COMFORT SPACER ADULT) MISC, Use with Albuterol  inhaler., Disp: 1 each, Rfl: 0   VIACTIV 500-500-40 MG-UNT-MCG CHEW, CHEW 1 TABLET BY MOUTH EVERY MORNING., Disp: , Rfl: 12  Clinical Interview:   The following information was obtained during a clinical interview with Ms. Ramnauth prior to cognitive testing.  Cognitive Symptoms: Decreased short-term memory: Endorsed. Ms. Hagg previously acknowledged having trouble recalling past events and some details of past conversations. She  also noted that her daughter will inform her that she will repeat herself during conversation. Her daughter previously theorized that decline was first observed following a stroke sustained around 2009, while also expressing concern for gradual progression over time. Currently, Ms. Caloca noted the potential for some memory memory decline over the past 1-2 years. She did not provide any specific examples, largely describing generalized difficulties which seem a bit more pronounced. Decreased long-term memory: Denied. Decreased attention/concentration: Denied. Her daughter previously reported occasional difficulties with sustained attention or distractibility.  Reduced processing speed: Endorsed perhaps "a bit." Difficulties with executive functions: Denied. Her daughter previously reported worsening indecision. Her daughter also reported some personality changes in that Ms. Moskal had become more argumentative and defensive, especially when there is mention of possible cognitive decline.  Difficulties with emotion regulation: Denied. Difficulties with receptive language: Denied. Difficulties with word finding: Denied. Decreased visuoperceptual ability: Denied.   Difficulties completing ADLs: Somewhat. Ms. Crunk lives with one of her daughters. She denied difficulty managing medications or personal finances. She does not drive. Per her daughter, one  of her physicians previously recommended that she limit driving pursuits. When she moved in with her daughter, her car was sold and she stopped driving altogether. All of this was stable over time per Ms. Reimers.   Additional Medical History: History of traumatic brain injury/concussion: Denied. History of stroke: Endorsed (see above). While Ms. Scroggs described no cognitive or behavioral changes or dysfunction arising from this event, her daughter did express concern that this is what first started memory difficulties.  History of seizure activity:  Denied. History of known exposure to toxins: Denied. Symptoms of chronic pain: Largely denied. She did report occasional symptoms of lymphedema which cause foot pain.  Experience of frequent headaches/migraines: Denied. Frequent instances of dizziness/vertigo: Denied.   Sensory changes: She has a history of macular degeneration and had prior cataracts removed. She utilizes reading glasses with benefit. She alluded to the potential for some mild hearing loss in that the television is often turned up loud. Other sensory changes/difficulties (e.g., taste or smell) were denied. This was stable over time.  Balance/coordination difficulties: Denied. However, she did previously report feeling as though she walks with a somewhat wider stance. No recent falls were reported. This was stable. Other motor difficulties: Denied.  Sleep History: Estimated hours obtained each night: 5-8 hours.  Difficulties falling asleep: Denied. Difficulties staying asleep: Endorsed. She continues to wake throughout the night and then have significant difficulties falling back asleep. Some difficulties were attributed to foot pain (see above) and the noise her CPAP machine makes (see below). Feels rested and refreshed upon awakening: Endorsed.   History of snoring: Endorsed. History of waking up gasping for air: Endorsed. Witnessed breath cessation while asleep: Endorsed. In the time since her previous evaluation, she completed a formal sleep study which revealed severe obstructive sleep apnea (AHI: 61/9/hour). She has been using a CPAP machine currently. However, she described difficulties utilizing the device, noting discomfort and that the noise of the machine keeps her awake. Overall, she felt as though she was experiencing worse sleep since utilizing this device.    History of vivid dreaming: Denied. Excessive movement while asleep: Denied. Instances of acting out her dreams: Denied.  Psychiatric/Behavioral Health  History: Depression: She described her current mood as "been fine" and denied any prior mental health concerns or diagnoses. Current or remote suicidal ideation, intent, or plan was denied.  Anxiety: Denied. Mania: Denied. Trauma History: Denied. Visual/auditory hallucinations: Denied. Delusional thoughts: Denied.   Tobacco: Denied. Alcohol: She denied current alcohol consumption as well as a history of problematic alcohol abuse or dependence.  Recreational drugs: Denied.  Family History: Problem Relation Age of Onset   Heart disease Father    Cancer Sister 71       Lymphoma   Breast cancer Sister    Cancer Paternal Aunt        Breast cancer   Cancer Daughter 12       Bilateral Breast Cancer   Breast cancer Daughter 58   Cancer Daughter        Breast Cancer   Breast cancer Daughter    Cancer Sister        2005 - Renal Cell Cancer and Breast Cancer   Breast cancer Sister    This information was confirmed by Ms. Canino.  Academic/Vocational History: Highest level of educational attainment: 12 years. She graduated from high school and described herself as a good (A/B) student in academic settings. 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 Efren Grapes was noted as a likely relative weakness.  History of developmental delay: Denied. History of grade repetition: Denied. Enrollment in special education courses: Denied. History of LD/ADHD: Denied.   Employment: Retired. She previously worked in Therapist, music capacities.  Evaluation Results:   Behavioral Observations: Ms. Urschel was unaccompanied, arrived to her appointment on time, and was appropriately dressed and groomed. She appeared alert. Observed gait and station were within normal limits. Gross motor functioning appeared intact upon informal observation and no abnormal movements (e.g., tremors) were noted. Her affect was generally relaxed and positive.  Spontaneous speech was fluent and word finding difficulties were not observed during the clinical interview. Thought processes were coherent, organized, and normal in content. Insight into her cognitive difficulties appeared adequate.   During testing, sustained attention was appropriate. Task engagement was adequate and she persisted when challenged. She fatigued as the evaluation progressed. It was mildly abbreviated in response. Overall, Ms. Kreuser was cooperative with the clinical interview and subsequent testing procedures.   Adequacy of Effort: The validity of neuropsychological testing is limited by the extent to which the individual being tested may be assumed to have exerted adequate effort during testing. Ms. Helf expressed her intention to perform to the best of her abilities and exhibited adequate task engagement and persistence. Scores across stand-alone and embedded performance validity measures were within expectation. As such, the results of the current evaluation are believed to be a valid representation of Ms. Freda's current cognitive functioning.  Test Results: Ms. Rostad was fully oriented at the time of the current evaluation.  Intellectual abilities based upon educational and vocational attainment were estimated to be in the average range. Premorbid abilities were estimated to be within the average range based upon a single-word reading test.   Processing speed was mildly variable but overall appropriate, ranging from the below average to above average normative ranges. Basic attention was average to above average. More complex attention (e.g., working memory) was below average to average. Cognitive flexibility was average. Other aspects of executive functioning were unable to be assessed due to increasing fatigue. She performed in the above average range across a task assessing safety and judgment.  While not directly assessed, receptive language abilities were believed to be  intact. Ms. Scarpati did not exhibit difficulties comprehending task instructions and answered all questions asked of her appropriately. Assessed expressive language was variable. Phonemic fluency was below average, semantic fluency was well below average, and confrontation naming was well below average across a screening task but average across a more comprehensive task. Writing samples were appropriate.   Assessed visuospatial/visuoconstructional abilities were average to well above average outside of an isolated weakness across a line orientation task.    Learning (i.e., encoding) of novel verbal information was below average to average. Spontaneous delayed recall (i.e., retrieval) of previously learned information was exceptionally low across list and figure-based tasks but average across a story-based task. Retention rates were 0% across a list learning task, 70% across a story learning task, and 10% across a figure drawing task. Performance across recognition tasks was below average to average, suggesting evidence for information consolidation.   Results of emotional screening instruments suggested that recent symptoms of generalized anxiety were in the minimal range, while symptoms of depression were within normal limits. A screening instrument assessing recent sleep quality suggested the presence of mild sleep dysfunction.  Tables of Scores:   Note: This summary of test scores accompanies the interpretive report and should not be  considered in isolation without reference to the appropriate sections in the text. Descriptors are based on appropriate normative data and may be adjusted based on clinical judgment. Terms such as "Within Normal Limits" and "Outside Normal Limits" are used when a more specific description of the test score cannot be determined. Descriptors refer to the current evaluation only.        Percentile - Normative Descriptor > 98 - Exceptionally High 91-97 - Well Above  Average 75-90 - Above Average 25-74 - Average 9-24 - Below Average 2-8 - Well Below Average < 2 - Exceptionally Low        Validity: April 2023 Current  DESCRIPTOR        DCT: --- --- --- Within Normal Limits  RBANS EI: --- --- --- Within Normal Limits  WAIS-IV RDS: --- --- --- Within Normal Limits        Orientation:       Raw Score Raw Score Percentile   NAB Orientation, Form 1 29/29 29/29 --- ---        Cognitive Screening:       Raw Score Raw Score Percentile   SLUMS: 26/30 19/30 --- ---        RBANS, Form A: Standard Score/ Scaled Score Standard Score/ Scaled Score Percentile   Total Score 95 80 9 Below Average  Immediate Memory 97 85 16 Below Average    List Learning 10 6 9  Below Average    Story Memory 9 9 37 Average  Visuospatial/Constructional 100 100 50 Average    Figure Copy 14 14 91 Well Above Average    Line Orientation 12/20 12/20 3-9 Well Below Average  Language 92 71 3 Well Below Average    Picture Naming 10/10 8/10 3-9 Well Below Average    Semantic Fluency 6 5 5  Well Below Average  Attention 100 94 34 Average    Digit Span 14 12 75 Above Average    Coding 6 6 9  Below Average  Delayed Memory 96 75 5 Well Below Average    List Recall 0/10 0/10 <2 Exceptionally Low    List Recognition 19/20 18/20 17-25 Below Average to  Average    Story Recall 7 9 37 Average    Story Recognition 10/12 9/12 29-52 Average    Figure Recall 9 3 1  Exceptionally Low    Figure Recognition 5/8 5/8 39-57 Average         Intellectual Functioning:       Standard Score Standard Score Percentile   Test of Premorbid Functioning: 101 100 50 Average        Attention/Executive Function:      Trail Making Test (TMT): Raw Score (Scaled Score) Raw Score (Scaled Score) Percentile     Part A 33 secs.,  0 errors (13) 32 secs.,  0 errors (13) 84 Above Average    Part B 145 secs.,  4 errors (9) 127 secs.,  0 errors (10) 50 Average  *Based on [employer] Older Normative Studies (MOANS)              Scaled Score Scaled Score Percentile   WAIS-5 Coding: --- 10 50 Average  WAIS-5 Naming Speed Quantity: --- 10 50 Average         Scaled Score Scaled Score Percentile   WAIS-IV Digit Span: 11 8 25  Average    Forward 10 8 25  Average    Backward 14 11 63 Average    Sequencing 10 6 9  Below Average  NAB Executive Functions Module, Form 1: T Score T Score Percentile     Judgment 52 61 86 Above Average        Language:      Verbal Fluency Test: Raw Score (Scaled Score) Raw Score (Scaled Score) Percentile     Phonemic Fluency (CFL) 19 (7) 20 (7) 16 Below Average    Category Fluency 23 (6) 20 (5) 5 Well Below Average  *Based on [employer] Older Normative Studies (MOANS)            NAB Language Module, Form 1: T Score T Score Percentile     Naming 23/31 (34) 27/31 (44) 27 Average    Writing --- 56 73 Average        Visuospatial/Visuoconstruction:       Raw Score Raw Score Percentile   Clock Drawing: 10/10 10/10 --- Within Normal Limits        Mood and Personality:       Raw Score Raw Score Percentile   Geriatric Depression Scale: 4 4 --- Within Normal Limits  Geriatric Anxiety Scale: 6 5 --- Minimal    Somatic 3 3 --- Minimal    Cognitive 2 1 --- Minimal    Affective 1 1 --- Minimal        Additional Questionnaires:       Raw Score Raw Score Percentile   PROMIS Sleep Disturbance Questionnaire: 21 27 --- Mild   Informed Consent and Coding/Compliance:   The current evaluation represents a clinical evaluation for the purposes previously outlined by the referral source and is in no way reflective of a forensic evaluation.   Ms. Muldoon was provided with a verbal description of the nature and purpose of the present neuropsychological evaluation. Also reviewed were the foreseeable risks and/or discomforts and benefits of the procedure, limits of confidentiality, and mandatory reporting requirements of this provider. The patient was given the opportunity to ask questions and  receive answers about the evaluation. Oral consent to participate was provided by the patient.   This evaluation was conducted by Melville Stade, Ph.D., ABPP-CN, board certified clinical neuropsychologist. Ms. Gomer completed a clinical interview with Dr. Kitty Perkins, billed as one unit [phone], and 125 minutes of cognitive testing and scoring, billed as one unit [phone] and three additional units [id number]. Psychometrist Arnulfo Larch, B.S. assisted Dr. Kitty Perkins with test administration and scoring procedures. As a separate and discrete service, one unit [id number] and two units [phone] were billed for Dr. Arsenio Larger time spent in interpretation and report writing.

## 2023-05-23 ENCOUNTER — Encounter: Payer: Self-pay | Admitting: Physician Assistant

## 2023-05-25 ENCOUNTER — Telehealth (INDEPENDENT_AMBULATORY_CARE_PROVIDER_SITE_OTHER): Payer: Medicare Other | Admitting: Psychology

## 2023-05-25 DIAGNOSIS — F0670 Mild neurocognitive disorder due to known physiological condition without behavioral disturbance: Secondary | ICD-10-CM | POA: Diagnosis not present

## 2023-05-25 DIAGNOSIS — I679 Cerebrovascular disease, unspecified: Secondary | ICD-10-CM | POA: Diagnosis not present

## 2023-05-25 DIAGNOSIS — Z8673 Personal history of transient ischemic attack (TIA), and cerebral infarction without residual deficits: Secondary | ICD-10-CM

## 2023-05-25 DIAGNOSIS — I999 Unspecified disorder of circulatory system: Secondary | ICD-10-CM | POA: Diagnosis not present

## 2023-05-26 NOTE — Progress Notes (Signed)
   Neuropsychology Feedback Session Tommas Fragmin. [facility] [place] Department of Neurology  Reason for Referral:   Michelle [name] is a 89 y.o. right-handed Caucasian female referred by Tex Filbert, PA-C, to characterize her current cognitive functioning and assist with diagnostic clarity and treatment planning in the context of a previously diagnosed mild vascular neurocognitive disorder and concern for progressive cognitive decline.   Feedback:   Michelle [name] completed a comprehensive neuropsychological evaluation on 05/18/2023. Please refer to that encounter for the full report and recommendations. Briefly, results suggested an isolated impairment surrounding semantic fluency. Further performance variability was exhibited across confrontation naming and delayed retrieval aspects of memory. While she exhibited an isolated weakness across a line orientation task, all other visuospatial tasks were appropriate. Relative to her previous evaluation in April 2023, her sharpest decline surrounded significant trouble recalling a previously drawn complex figure after a brief delay. More mild declines were exhibited across attention/concentration, semantic fluency, and encoding aspects of memory. However, in several of these cases, current performances still remained normatively appropriate. Variability was exhibited across confrontation naming where one task suggested mild decline and another suggested mild improvement. Other assessed domains exhibited stability over time. Regarding etiology, I continue to favor a primary vascular cause (i.e., mild vascular neurocognitive disorder) at the present time. Prior neuroimaging has suggested moderate to extensive cerebrovascular disease that has progressed over time and a remote left cerebellar infarct. As this scan was performed in 2022, there remains the potential that further progression has occurred over the past several years which could account for some  decline across testing. Specific to memory, delayed retrieval rates were 0% and 10% across list and figure-based tasks respectively. However, she retained 70% of previously learned story-based information after a delay and performances were normatively appropriate across recognition trials across all three memory tasks. Overall, while some concern for rapid forgetting is reasonable, this is not a consistent finding and she does not exhibit any memory storage abnormalities presently. As such, she does not exhibit a classic testing pattern suggestive of underlying Alzheimer's disease. While the very early stages of this illness cannot be ruled out, its presence cannot be ruled in with any degree of confidence based upon current testing patterns.  Michelle [name] was accompanied by her daughter during the current video-based virtual feedback session. She was within her residence while I was within my office. I discussed the limitations of evaluation and management by telemedicine and the availability of in person appointments. Michelle [name] expressed her understanding and agreed to proceed. Content of the current session focused on the results of her neuropsychological evaluation. Michelle [name] was given the opportunity to ask questions and her questions were answered. She was encouraged to reach out should additional questions arise. A copy of her report was mailed at the conclusion of the visit.      One unit [id number] (31 minutes) was billed for Dr. Arsenio Larger time spent preparing for, conducting, and documenting the current feedback session with Michelle [name].

## 2023-05-30 ENCOUNTER — Other Ambulatory Visit: Payer: Self-pay | Admitting: Family Medicine

## 2023-06-28 ENCOUNTER — Ambulatory Visit: Admitting: Physician Assistant

## 2023-07-03 ENCOUNTER — Other Ambulatory Visit: Payer: Self-pay | Admitting: Physician Assistant

## 2023-07-03 DIAGNOSIS — I10 Essential (primary) hypertension: Secondary | ICD-10-CM

## 2023-07-10 ENCOUNTER — Telehealth: Payer: Self-pay

## 2023-07-10 ENCOUNTER — Encounter: Payer: Self-pay | Admitting: Family Medicine

## 2023-07-10 NOTE — Progress Notes (Signed)
 Assessment/Plan:    Mild vascular cognitive impairment   Michelle [name] is a very pleasant 89 y.o. RH female with a history ofhypertension, hyperlipidemia, AAA, osteoporosis, h/o CVA 2009 with residual memory loss, R breast Cancer on anastrozole  and h/o Endometrial Ca, Iron deficiency Anemia, COPD and a diagnosis of mild cognitive impairment likely of vascular etiology, with findings not suggestive of Alzheimer's disease at this time  presenting today in follow-up for evaluation of memory loss. Patient is on memantine  10 mg bid, tolerating well.     Recommendations:   Follow up in 6  months. Continue Memantine  10 mg twice daily. Side effects were discussed Recommend increasing social interaction  Repeat MRI brain to further evaluate structural abnormalities and vascular load.  Recommend good control of cardiovascular risk factors Continue to control mood as per PCP Follow COPD, OSA with pulmonary    Subjective:   This patient is accompanied in the office by her daughter  who supplements the history. Previous records as well as any outside records available were reviewed prior to todays visit.   Patient was last seen on 01/10/23 with MMSE 30/30  .    Any changes in memory since last visit? "About the same". She writes on her calendar to remember appointments.She has trouble with decision making. Likes to do Bible word search. Goes through the alphabet to remember names. She is not as active socially as before.  [facility] in [place] repeats oneself?  Endorsed Disoriented when walking into a room?  Patient denies   Misplacing objects?  Patient denies   Wandering behavior?   Denies. Any personality changes since last visit? Denies.   Any worsening depression?: denies.   Hallucinations or paranoia?  Denies.   Seizures?   Denies.    Any sleep changes? Sleeps well, sometimes she uses Tylenol  pm. Denies vivid dreams, REM behavior or sleepwalking   Sleep apnea?  Endorsed,  uses CPAP  Any hygiene concerns?   Denies.   Independent of bathing and dressing?  Endorsed  Does the patient needs help with medications? Patient is in charge  Who is in charge of the finances?  Patient is in charge    Any changes in appetite?  Denies. Eats well Patient have trouble swallowing?  Denies.   Does the patient cook? No  Any headaches?    Denies.   Vision changes? Denies. Chronic pain?  Denies.   Ambulates with difficulty?   Denies.   Recent falls or head injuries?    Denies.      Unilateral weakness, numbness or tingling?  Denies.   Any tremors?  Denies.   Any anosmia?    Denies.   Any incontinence of urine?  Endorsed, wears pads Any bowel dysfunction?  Denies.      Patient lives with her daughter .  Does the patient drive? No longer drives   Neurocognitive testing 05/18/23 Dr. Kitty Perkins Briefly, results suggested an isolated impairment surrounding semantic fluency. Further performance variability was exhibited across confrontation naming and delayed retrieval aspects of memory. While she exhibited an isolated weakness across a line orientation task, all other visuospatial tasks were appropriate. Relative to her previous evaluation in April 2023, her sharpest decline surrounded significant trouble recalling a previously drawn complex figure after a brief delay. More mild declines were exhibited across attention/concentration, semantic fluency, and encoding aspects of memory. However, in several of these cases, current performances still remained normatively appropriate. Variability was exhibited across confrontation naming where one task suggested mild decline  and another suggested mild improvement. Other assessed domains exhibited stability over time. Regarding etiology, I continue to favor a primary vascular cause (i.e., mild vascular neurocognitive disorder) at the present time. Prior neuroimaging has suggested moderate to extensive cerebrovascular disease that has progressed over time  and a remote left cerebellar infarct. As this scan was performed in 2022, there remains the potential that further progression has occurred over the past several years which could account for some decline across testing. Specific to memory, delayed retrieval rates were 0% and 10% across list and figure-based tasks respectively. However, she retained 70% of previously learned story-based information after a delay and performances were normatively appropriate across recognition trials across all three memory tasks. Overall, while some concern for rapid forgetting is reasonable, this is not a consistent finding and she does not exhibit any memory storage abnormalities presently. As such, she does not exhibit a classic testing pattern suggestive of underlying Alzheimer's disease. While the very early stages of this illness cannot be ruled out, its presence cannot be ruled in with any degree of confidence based upon current testing patterns.   Initial evaluation 08/27/2020 the patient is seen in neurologic consultation at the request of Cydney Draft, * for the evaluation of memory.  The patient is accompanied by daughter Michelle [name] who supplements the history.  Her other daughter Michelle [name] is on the phone with us .  The patient is an 89 y.o. year old female who has had memory issues since her stroke in 2009.  However, the symptoms became more noticeable after a day sudden death of her husband to massive heart attack in December 2021.  This caused significant emotional stress on the patient and she reports "now I am fine mainly coming out of it, but it was a rough 6 or 7 months ".  At that time, she could not sleep more than 3 hours a night, and was losing the confidence of being on my own, increasingly anxious, and having less patience ".  She moved a couple of months ago to be with her daughter, and now she is beginning to increase her activity, and going out of the house more often, although she feels that the memory  loss "is still there ". She has been seen by her PCP, who suspects mild cognitive impairment.  She is in no medications at this time.  She has never been seen by a neurologist until now. Her daughter reports that she repeats the same questions, also tells the same stories for at least a year. Denies hallucinations or paranoia.  She denies acting out in her dreams, or sleepwalking.  Denies issues with bathing or dressing.  Denies missing any medications.  She does her own finances and denies missing any meals.  She denies leaving objects in unusual places.  Her appetite is good, denies trouble swallowing.  She cooks, and denies leaving the stove or the faucet on.  She used to drive until recently, but "the daughter took the keys of the car because she is afraid I am going to get into an accident " Michelle [name] states that there was one episode where she did not recall how to get to the hair salon despite having known this place for a long time, in which the patient interjects "well I just moved to a different place and I am trying to learn my way around, and the roads changed ".  She ambulates without difficulty without a walker or a cane, and denies any recent falls,  or trauma to the head. She denies any double vision, dizziness, focal numbness or tingling, unilateral weakness or tremors.  She denies anosmia.  Denies a history of sleep apnea, alcohol.  She quit in 1998 the use of tobacco.  Family history remarkable for maternal grandfather with dementia. She has 12th grade education.   Labs 06/23/2020 Vitamin D  25 hydroxy 66.  31, normal CMP normal CBC remarkable for hemoglobin 11.8, hematocrit 34.5 remarkable for mild anemia   MRI of the brain with and without contrast 06/16/2020 for memory loss   1. No reversible finding or evidence of metastatic disease. 2. Moderate to extensive chronic small vessel ischemia with notable progression from 2009. 3. Mild cerebral volume loss since prior without specific pattern.      Past Medical History:  Diagnosis Date   Abdominal aortic aneurysm (AAA) 3.0 cm to 5.0 cm in diameter in female 11/18/2016   Due for repeat in 12/2019.   Abnormal hemoglobin 08/25/2022   Allergic rhinitis 05/26/2020   Aneurysm of infrarenal abdominal aorta 11/19/2013   2.6 cm in 2006 on CT.   CT 10/15 show 3 cm lesion.   US   2018 - no change  CT 2020 - 2.9 cm, stable      Arthritis    hips, lower back   Basal cell carcinoma 2011   R back   Bell's palsy 1984   Bilateral cold feet 03/28/2019   Blood transfusion without reported diagnosis 07/2015   Bradycardia    Cataract    Cholecystitis 2004   Chronic constipation 08/14/2018   Clotting disorder    Complication of anesthesia    COPD (chronic obstructive pulmonary disease)    Essential hypertension, benign 09/26/2007   Genetic testing 09/16/2021   Negative hereditary cancer genetic testing: no pathogenic variants detected in CancerNext-Expanded +RNAinsight Panel.  Report date is September 06, 2021.      The CancerNext-Expanded gene panel offered by [facility] and includes sequencing, rearrangement, and RNA analysis for the following 77 genes: AIP, ALK, APC, ATM, AXIN2, BAP1, BARD1, BLM, BMPR1A, BRCA1, BRCA2, BRIP1, CDC73, CDH1, CDK4, CDKN1B   GERD 11/03/2005   Hiatal hernia 2008   History of CVA (cerebrovascular accident) 09/24/2007   left cerebellar infarction, PICA territory   History of endometrial cancer 04/30/2013   uterine   History of GI bleed 07/04/2015   Hyperlipidemia 11/03/2005   LDL goal <100   Interstitial lung disease 03/28/2019   Iron deficiency anemia, unspecified    Low serum vitamin B12 08/26/2021   Lymphedema of both lower extremities 08/25/2022   Macular degeneration    Malignant neoplasm of upper-outer quadrant of right breast in female, estrogen receptor positive 02/21/2011   Mild vascular neurocognitive disorder 05/14/2021   OSA (obstructive sleep apnea) 04/28/2022   04/2022: HST, AHI 61.9/hr, no  central apneas.  Set to 8 cm water pressure based on AutoPap results.     Osteoporosis 11/19/2014   DEXA 10/27/14.  Repeat in 2 yr.  High risk to start medication.    Polyp of colon 2008, 2014   Benign   PONV (postoperative nausea and vomiting)    Primary osteoarthritis of right knee 06/22/2015   Senile purpura 10/12/2022   Shingles 2008   Squamous cell skin cancer, face 03/22/2012   Actively seeing dermatology.   Tendonitis    rt hand   Venous stasis of both lower extremities 08/14/2018   Vocal cord polyp      Past Surgical History:  Procedure Laterality Date  ABDOMINAL HYSTERECTOMY  2005   APPENDECTOMY     BASAL CELL CARCINOMA EXCISION     biospy  10/28/2009   Shave Biospy skin Left hand(Acitinic Keratoses), Right Upper Arm (superficial Basal Cell), Right Upper Back(Superficial Basal Cell), Left Neck ( Solar Lentigo & Seborrheic Keratoses)   BREAST BIOPSY  02/11/2011   Right Breast Needle Core Biospy - Upper Outer Quadrant; ER/PR 100%, Her-2 Neu neg.; Ki-67 10%   BREAST LUMPECTOMY WITH RADIOACTIVE SEED LOCALIZATION Right 06/12/2018   Procedure: RIGHT BREAST LUMPECTOMY WITH RADIOACTIVE SEED LOCALIZATION;  Surgeon: Enid Harry, MD;  Location: [facility];  Service: General;  Laterality: Right;   BREAST MAMMOSITE  03/14/2011   Procedure: MAMMOSITE BREAST;  Surgeon: Enid Harry, MD;  Location: [facility];  Service: General;  Laterality: Right;   BREAST SURGERY     right breast lumpectomy snbx   CHOLECYSTECTOMY  2004   COLONOSCOPY     COLONOSCOPY W/ POLYPECTOMY     2008, 2014 (benign)   COLONOSCOPY WITH PROPOFOL  N/A 07/09/2015   Procedure: COLONOSCOPY WITH PROPOFOL ;  Surgeon: Celedonio Coil, MD;  Location: [facility] ENDOSCOPY;  Service: Endoscopy;  Laterality: N/A;   DILATION AND CURETTAGE OF UTERUS N/A 04/24/2013   Procedure: DILATATION AND CURETTAGE;  Surgeon: Rik Chasten, MD;  Location: [facility];  Service: Gynecology;  Laterality: N/A;    ESOPHAGOGASTRODUODENOSCOPY N/A 07/06/2015   Procedure: ESOPHAGOGASTRODUODENOSCOPY (EGD);  Surgeon: Baldo Bonds, MD;  Location: [facility] END[facility]COPY;  Service: Endoscopy;  Laterality: N/A;   HYSTEROSCOPY N/A 04/24/2013   Procedure: HYSTEROSCOPY;  Surgeon: Rik Chasten, MD;  Location: [facility];  Service: Gynecology;  Laterality: N/A;   ROBOTIC ASSISTED TOTAL HYSTERECTOMY WITH BILATERAL SALPINGO OOPHERECTOMY Bilateral 06/11/2013   Procedure: ROBOTIC ASSISTED TOTAL HYSTERECTOMY WITH BILATERAL SALPINGO OOPHORECTOMY WITH POSSIBLE STAGING, EPISIOTOMY;  Surgeon: Terry Ficks, MD;  Location: [facility];  Service: Gynecology;  Laterality: Bilateral;   SQUAMOUS CELL CARCINOMA EXCISION     TEAR DUCT PROBING     TONSILLECTOMY     vocal cord poylps  1969   excision     PREVIOUS MEDICATIONS:   CURRENT MEDICATIONS:  Outpatient Encounter Medications as of 07/11/2023  Medication Sig   acetaminophen  (TYLENOL ) 325 MG tablet Take 650 mg by mouth as needed for mild pain.    albuterol  (VENTOLIN  HFA) 108 (90 Base) MCG/ACT inhaler Inhale 2 puffs into the lungs every 6 (six) hours as needed for wheezing or shortness of breath.   AMBULATORY NON FORMULARY MEDICATION Medication Name: Set CPAP to 8-11 mm HG water pressure. Please send down load after 4 weeks of use to [phone].   anastrozole  (ARIMIDEX ) 1 MG tablet TAKE 1 TABLET BY MOUTH DAILY   atorvastatin  (LIPITOR) 20 MG tablet TAKE 1 TABLET BY MOUTH EVERY  OTHER NIGHT   Azelastine HCl 137 MCG/SPRAY SOLN Place 2 sprays into both nostrils 2 (two) times daily.   cholecalciferol (VITAMIN D3) 25 MCG (1000 UT) tablet Take 1,000 Units by mouth daily.   clopidogrel  (PLAVIX ) 75 MG tablet TAKE 1 TABLET BY MOUTH DAILY   diclofenac  Sodium (VOLTAREN ) 1 % GEL Apply 2 g topically 4 (four) times daily.   famotidine  (PEPCID ) 20 MG tablet TAKE 1 TABLET BY MOUTH TWICE  DAILY   loratadine  (CLARITIN ) 10 MG tablet Take 1 tablet (10 mg total) by mouth daily.   losartan  (COZAAR ) 25 MG  tablet TAKE 1 TABLET BY MOUTH DAILY   memantine  (NAMENDA ) 10 MG tablet TAKE 1 TABLET BY MOUTH TWICE  DAILY  Multiple Vitamins-Minerals (ICAPS AREDS 2 PO) Take 1 capsule by mouth 2 (two) times daily at 10 AM and 5 PM. Morning and evening   Spacer/Aero-Holding Chambers (PRO COMFORT SPACER ADULT) MISC Use with Albuterol  inhaler.   VIACTIV 500-500-40 MG-UNT-MCG CHEW CHEW 1 TABLET BY MOUTH EVERY MORNING.   No facility-administered encounter medications on file as of 07/11/2023.     Objective:     PHYSICAL EXAMINATION:    VITALS:  There were no vitals filed for this visit.  GEN:  The patient appears stated age and is in NAD. HEENT:  Normocephalic, atraumatic.   Neurological examination:  General: NAD, well-groomed, appears stated age. Orientation: The patient is alert. Oriented to person, place and not to date.*** Cranial nerves: There is good facial symmetry.The speech is fluent and clear. No aphasia or dysarthria. Fund of knowledge is appropriate. Recent memory impaired and remote memory is normal.  Attention and concentration are normal.  Able to name objects and repeat phrases.  Hearing is intact to conversational tone ***.   Delayed recall *** Sensation: Sensation is intact to light touch throughout Motor: Strength is at least antigravity x4. DTR's 2/4 in UE/LE      08/27/2020   10:00 AM  Montreal Cognitive Assessment   Visuospatial/ Executive (0/5) 4  Naming (0/3) 3  Attention: Read list of digits (0/2) 2  Attention: Read list of letters (0/1) 1  Attention: Serial 7 subtraction starting at 100 (0/3) 2  Language: Repeat phrase (0/2) 2  Language : Fluency (0/1) 0  Abstraction (0/2) 2  Delayed Recall (0/5) 1  Orientation (0/6) 6  Total 23  Adjusted Score (based on education) 24       01/10/2023    6:00 PM 07/28/2022    2:00 PM 11/26/2019    2:47 PM  MMSE - Mini Mental State Exam  Orientation to time 5 5 4   Orientation to Place 5 5 5   Registration 3 3 3   Attention/  Calculation 5 5 5   Recall 3 3 3   Language- name 2 objects 2 2 2   Language- repeat 1 1 1   Language- follow 3 step command 3 3 3   Language- read & follow direction 1 1 1   Write a sentence 1 1 1   Copy design 1 1 1   Total score 30 30 29        Movement examination: Tone: There is normal tone in the UE/LE Abnormal movements:  no tremor.  No myoclonus.  No asterixis.   Coordination:  There is no decremation with RAM's. Normal finger to nose  Gait and Station: The patient has no difficulty arising out of a deep-seated chair without the use of the hands. The patient's stride length is good.  Gait is cautious and narrow.   Thank you for allowing us  the opportunity to participate in the care of this nice patient. Please do not hesitate to contact us  for any questions or concerns.   Total time spent on today's visit was *** minutes dedicated to this patient today, preparing to see patient, examining the patient, ordering tests and/or medications and counseling the patient, documenting clinical information in the EHR or other health record, independently interpreting results and communicating results to the patient/family, discussing treatment and goals, answering patient's questions and coordinating care.  Cc:  Cydney Draft, MD  Tex Filbert 07/10/2023 12:36 PM

## 2023-07-10 NOTE — Telephone Encounter (Signed)
 Copied from CRM [id number]. Topic: Clinical - Order For Equipment >> Jul 10, 2023  9:12 AM Tisa Forester wrote: Reason for CRM: need cpap supplies  [employer]  [phone]  will be refaxing over the order  if can be sign by provider and fax back today at 2:00pm  Carolyn Cisco is a supervisor can get supplies sent out today  patient call back number :  [phone] (side note *patient and her daughter both are needing cpap supplies so there will be 2 orders for patient and her daughter >> Jul 10, 2023  9:23 AM Bearl Botts A wrote: Reason for CRM: Carolyn Cisco with [facility] is faxing over an urgent request form for CPAP machine supplies. Patient is out of supplies and is needing them as soon as possible.

## 2023-07-11 ENCOUNTER — Ambulatory Visit: Admitting: Physician Assistant

## 2023-07-11 ENCOUNTER — Encounter: Payer: Self-pay | Admitting: Physician Assistant

## 2023-07-11 ENCOUNTER — Telehealth: Payer: Self-pay | Admitting: Family Medicine

## 2023-07-11 VITALS — BP 142/77 | HR 98 | Resp 20 | Ht 67.0 in | Wt 186.0 lb

## 2023-07-11 DIAGNOSIS — F0670 Mild neurocognitive disorder due to known physiological condition without behavioral disturbance: Secondary | ICD-10-CM

## 2023-07-11 DIAGNOSIS — I999 Unspecified disorder of circulatory system: Secondary | ICD-10-CM | POA: Diagnosis not present

## 2023-07-11 DIAGNOSIS — R413 Other amnesia: Secondary | ICD-10-CM | POA: Diagnosis not present

## 2023-07-11 NOTE — Telephone Encounter (Signed)
This was faxed this AM

## 2023-07-11 NOTE — Telephone Encounter (Signed)
 Copied from CRM [id number]. Topic: Clinical - Medication Question >> Jul 11, 2023  4:30 PM Kevelyn M wrote: Reason for CRM: Jearlean Mince with [facility] calling to to confirm reception of fax but you she needs the setting of CPAP including in fax. Please fax to [phone].

## 2023-07-11 NOTE — Patient Instructions (Addendum)
 It was a pleasure to see you today at our office.   Recommendations:  Follow up in 6 months   Continue memantine  10 mg mg two times a day   Consider [facility] or Volunteer Activities   Repeat MRI brain to compare [phone]    RECOMMENDATIONS FOR ALL PATIENTS WITH MEMORY PROBLEMS:  1. Continue to exercise (Recommend 30 minutes of walking everyday, or 3 hours every week) 2. Increase social interactions - continue going to [place] and enjoy social gatherings with friends and family 3. Eat healthy, avoid fried foods and eat more fruits and vegetables 4. Maintain adequate blood pressure, blood sugar, and blood cholesterol level. Reducing the risk of stroke and cardiovascular disease also helps promoting better memory. 5. Avoid stressful situations. Live a simple life and avoid aggravations. Organize your time and prepare for the next day in anticipation. 6. Sleep well, avoid any interruptions of sleep and avoid any distractions in the bedroom that may interfere with adequate sleep quality 7. Avoid sugar, avoid sweets as there is a strong link between excessive sugar intake, diabetes, and cognitive impairment We discussed the Mediterranean diet, which has been shown to help patients reduce the risk of progressive memory disorders and reduces cardiovascular risk. This includes eating fish, eat fruits and green leafy vegetables, nuts like almonds and hazelnuts, walnuts, and also use olive oil. Avoid fast foods and fried foods as much as possible. Avoid sweets and sugar as sugar use has been linked to worsening of memory function.  There is always a concern of gradual progression of memory problems. If this is the case, then we may need to adjust level of care according to patient needs. Support, both to the patient and caregiver, should then be put into place.    FALL PRECAUTIONS: Be cautious when walking. Scan the area for obstacles that may increase the risk of trips and falls. When getting  up in the mornings, sit up at the edge of the bed for a few minutes before getting out of bed. Consider elevating the bed at the head end to avoid drop of blood pressure when getting up. Walk always in a well-lit room (use night lights in the walls). Avoid area rugs or power cords from appliances in the middle of the walkways. Use a walker or a cane if necessary and consider physical therapy for balance exercise. Get your eyesight checked regularly.  FINANCIAL OVERSIGHT: Supervision, especially oversight when making financial decisions or transactions is also recommended.  HOME SAFETY: Consider the safety of the kitchen when operating appliances like stoves, microwave oven, and blender. Consider having supervision and share cooking responsibilities until no longer able to participate in those. Accidents with firearms and other hazards in the house should be identified and addressed as well.   ABILITY TO BE LEFT ALONE: If patient is unable to contact 911 operator, consider using LifeLine, or when the need is there, arrange for someone to stay with patients. Smoking is a fire hazard, consider supervision or cessation. Risk of wandering should be assessed by caregiver and if detected at any point, supervision and safe proof recommendations should be instituted.  MEDICATION SUPERVISION: Inability to self-administer medication needs to be constantly addressed. Implement a mechanism to ensure safe administration of the medications.       Mediterranean Diet A Mediterranean diet refers to food and lifestyle choices that are based on the traditions of countries located on the [employer] [place]. This way of eating has been shown to help prevent certain  conditions and improve outcomes for people who have chronic diseases, like kidney disease and heart disease. What are tips for following this plan? Lifestyle  Cook and eat meals together with your family, when possible. Drink enough fluid to keep your urine  clear or pale yellow. Be physically active every day. This includes: Aerobic exercise like running or swimming. Leisure activities like gardening, walking, or housework. Get 7-8 hours of sleep each night. If recommended by your health care provider, drink red wine in moderation. This means 1 glass a day for nonpregnant women and 2 glasses a day for men. A glass of wine equals 5 oz (150 mL). Reading food labels  Check the serving size of packaged foods. For foods such as rice and pasta, the serving size refers to the amount of cooked product, not dry. Check the total fat in packaged foods. Avoid foods that have saturated fat or trans fats. Check the ingredients list for added sugars, such as corn syrup. Shopping  At the grocery store, buy most of your food from the areas near the walls of the store. This includes: Fresh fruits and vegetables (produce). Grains, beans, nuts, and seeds. Some of these may be available in unpackaged forms or large amounts (in bulk). Fresh seafood. Poultry and eggs. Low-fat dairy products. Buy whole ingredients instead of prepackaged foods. Buy fresh fruits and vegetables in-season from local farmers markets. Buy frozen fruits and vegetables in resealable bags. If you do not have access to quality fresh seafood, buy precooked frozen shrimp or canned fish, such as tuna, salmon, or sardines. Buy small amounts of raw or cooked vegetables, salads, or olives from the deli or salad bar at your store. Stock your pantry so you always have certain foods on hand, such as olive oil, canned tuna, canned tomatoes, rice, pasta, and beans. Cooking  Cook foods with extra-virgin olive oil instead of using butter or other vegetable oils. Have meat as a side dish, and have vegetables or grains as your main dish. This means having meat in small portions or adding small amounts of meat to foods like pasta or stew. Use beans or vegetables instead of meat in common dishes like chili or  lasagna. Experiment with different cooking methods. Try roasting or broiling vegetables instead of steaming or sauteing them. Add frozen vegetables to soups, stews, pasta, or rice. Add nuts or seeds for added healthy fat at each meal. You can add these to yogurt, salads, or vegetable dishes. Marinate fish or vegetables using olive oil, lemon juice, garlic, and fresh herbs. Meal planning  Plan to eat 1 vegetarian meal one day each week. Try to work up to 2 vegetarian meals, if possible. Eat seafood 2 or more times a week. Have healthy snacks readily available, such as: Vegetable sticks with hummus. Greek yogurt. Fruit and nut trail mix. Eat balanced meals throughout the week. This includes: Fruit: 2-3 servings a day Vegetables: 4-5 servings a day Low-fat dairy: 2 servings a day Fish, poultry, or lean meat: 1 serving a day Beans and legumes: 2 or more servings a week Nuts and seeds: 1-2 servings a day Whole grains: 6-8 servings a day Extra-virgin olive oil: 3-4 servings a day Limit red meat and sweets to only a few servings a month What are my food choices? Mediterranean diet Recommended Grains: Whole-grain pasta. Brown rice. Bulgar wheat. Polenta. Couscous. Whole-wheat bread. Dwyane Glad. Vegetables: Artichokes. Beets. Broccoli. Cabbage. Carrots. Eggplant. Green beans. Chard. Kale. Spinach. Onions. Leeks. Peas. Squash. Tomatoes. Peppers. Radishes.  Fruits: Apples. Apricots. Avocado. Berries. Bananas. Cherries. Dates. Figs. Grapes. Lemons. Melon. Oranges. Peaches. Plums. Pomegranate. Meats and other protein foods: Beans. Almonds. Sunflower seeds. Pine nuts. Peanuts. Cod. Salmon. Scallops. Shrimp. Tuna. Tilapia. Clams. Oysters. Eggs. Dairy: Low-fat milk. Cheese. Greek yogurt. Beverages: Water. Red wine. Herbal tea. Fats and oils: Extra virgin olive oil. Avocado oil. Grape seed oil. Sweets and desserts: [place] yogurt with honey. Baked apples. Poached pears. Trail mix. Seasoning and  other foods: Basil. Cilantro. Coriander. Cumin. Mint. Parsley. Sage. Rosemary. Tarragon. Garlic. Oregano. Thyme. Pepper. Balsalmic vinegar. Tahini. Hummus. Tomato sauce. Olives. Mushrooms. Limit these Grains: Prepackaged pasta or rice dishes. Prepackaged cereal with added sugar. Vegetables: Deep fried potatoes (french fries). Fruits: Fruit canned in syrup. Meats and other protein foods: Beef. Pork. Lamb. Poultry with skin. Hot dogs. Helene Loader. Dairy: Ice cream. Sour cream. Whole milk. Beverages: Juice. Sugar-sweetened soft drinks. Beer. Liquor and spirits. Fats and oils: Butter. Canola oil. Vegetable oil. Beef fat (tallow). Lard. Sweets and desserts: Cookies. Cakes. Pies. Candy. Seasoning and other foods: Mayonnaise. Premade sauces and marinades. The items listed may not be a complete list. Talk with your dietitian about what dietary choices are right for you. Summary The Mediterranean diet includes both food and lifestyle choices. Eat a variety of fresh fruits and vegetables, beans, nuts, seeds, and whole grains. Limit the amount of red meat and sweets that you eat. Talk with your health care provider about whether it is safe for you to drink red wine in moderation. This means 1 glass a day for nonpregnant women and 2 glasses a day for men. A glass of wine equals 5 oz (150 mL). This information is not intended to replace advice given to you by your health care provider. Make sure you discuss any questions you have with your health care provider. Document Released: 09/10/2015 Document Revised: 10/13/2015 Document Reviewed: 09/10/2015 Elsevier Interactive Patient Education  2017 [employer].    Your provider has requested that you have labwork completed today. Please go to [facility] ([id number]) on the second floor of this building before leaving the office today. You do not need to check in. If you are not called within 15 minutes please check with the front desk.

## 2023-07-13 DIAGNOSIS — H04123 Dry eye syndrome of bilateral lacrimal glands: Secondary | ICD-10-CM | POA: Diagnosis not present

## 2023-07-13 DIAGNOSIS — H524 Presbyopia: Secondary | ICD-10-CM | POA: Diagnosis not present

## 2023-07-13 DIAGNOSIS — H353132 Nonexudative age-related macular degeneration, bilateral, intermediate dry stage: Secondary | ICD-10-CM | POA: Diagnosis not present

## 2023-07-13 DIAGNOSIS — H40013 Open angle with borderline findings, low risk, bilateral: Secondary | ICD-10-CM | POA: Diagnosis not present

## 2023-07-13 DIAGNOSIS — H35033 Hypertensive retinopathy, bilateral: Secondary | ICD-10-CM | POA: Diagnosis not present

## 2023-07-14 NOTE — Telephone Encounter (Signed)
Orders faxed; confirmation received.

## 2023-07-26 ENCOUNTER — Ambulatory Visit: Payer: Medicare Other | Admitting: Physician Assistant

## 2023-07-26 ENCOUNTER — Ambulatory Visit
Admission: RE | Admit: 2023-07-26 | Discharge: 2023-07-26 | Disposition: A | Discharge status: Home / Self Care | Source: Ambulatory Visit | Attending: Physician Assistant | Admitting: Physician Assistant

## 2023-07-26 DIAGNOSIS — I6782 Cerebral ischemia: Secondary | ICD-10-CM | POA: Diagnosis not present

## 2023-07-26 DIAGNOSIS — G319 Degenerative disease of nervous system, unspecified: Secondary | ICD-10-CM | POA: Diagnosis not present

## 2023-07-27 ENCOUNTER — Ambulatory Visit: Payer: Self-pay | Admitting: Physician Assistant

## 2023-08-08 ENCOUNTER — Encounter: Payer: Self-pay | Admitting: Family Medicine

## 2023-08-08 ENCOUNTER — Ambulatory Visit (INDEPENDENT_AMBULATORY_CARE_PROVIDER_SITE_OTHER): Admitting: Family Medicine

## 2023-08-08 VITALS — BP 130/78 | HR 68 | Ht 67.0 in | Wt 187.0 lb

## 2023-08-08 DIAGNOSIS — E785 Hyperlipidemia, unspecified: Secondary | ICD-10-CM | POA: Diagnosis not present

## 2023-08-08 DIAGNOSIS — N3941 Urge incontinence: Secondary | ICD-10-CM | POA: Diagnosis not present

## 2023-08-08 DIAGNOSIS — R7989 Other specified abnormal findings of blood chemistry: Secondary | ICD-10-CM | POA: Diagnosis not present

## 2023-08-08 DIAGNOSIS — I10 Essential (primary) hypertension: Secondary | ICD-10-CM

## 2023-08-08 DIAGNOSIS — G4733 Obstructive sleep apnea (adult) (pediatric): Secondary | ICD-10-CM | POA: Diagnosis not present

## 2023-08-08 DIAGNOSIS — I999 Unspecified disorder of circulatory system: Secondary | ICD-10-CM | POA: Diagnosis not present

## 2023-08-08 DIAGNOSIS — J849 Interstitial pulmonary disease, unspecified: Secondary | ICD-10-CM

## 2023-08-08 DIAGNOSIS — F0670 Mild neurocognitive disorder due to known physiological condition without behavioral disturbance: Secondary | ICD-10-CM

## 2023-08-08 NOTE — Assessment & Plan Note (Signed)
 We did discuss that she may want to consider transitioning from just pads to depends and she is soaking them pretty thoroughly.  We also discussed that there are sometimes some other options that might be helpful such as Botox that could be a consideration she would need to be evaluated for that.  For now if she is managing since she is not having any skin breakdown or frequent UTIs then can continue with absorbent underwear.

## 2023-08-08 NOTE — Assessment & Plan Note (Signed)
 Doing well with her CPAP.  Still struggles occassionally

## 2023-08-08 NOTE — Assessment & Plan Note (Signed)
 Well controlled. Continue current regimen. Follow up in  6 mo

## 2023-08-08 NOTE — Assessment & Plan Note (Addendum)
 Continue amantadine. MoCA score 23 out of 30 today. This is actually pretty stable she actually scored a 19 back in January 2024.  So she stayed pretty stable since 2022 in regards to her MoCA score.  She has great home support.  Did encourage her to work on increasing her activity level.  She does have some good foot pedals at home and encouraged her to use those.

## 2023-08-08 NOTE — Assessment & Plan Note (Signed)
 She still gets a little short of breath with activities but overall is doing well she has not had any flares or exacerbations.  She does have an albuterol  inhaler at home to use as needed.

## 2023-08-08 NOTE — Assessment & Plan Note (Signed)
 Due for updaed lipid panel. On Lipitor.

## 2023-08-08 NOTE — Progress Notes (Signed)
 Established Patient Office Visit  Subjective  Patient ID: Michelle [name], female    DOB: 25-Jul-1932  Age: 89 y.o. MRN: [id number]  Chief Complaint  Patient presents with   Medical Management of Chronic Issues    HPI She did see neurology since I last saw her around June 10.  She is on amantadine twice a day and is stable.  They encouraged her to continue her regimen and recommended repeat MRI.  Continue to control cardiovascular risk factors and mood.  And they want to see her back in December.  Recent MRI performed at [facility] neurology just showed moderate to advanced chronic small vessel ischemic disease within the cerebral white matter slightly progressed from MRI of May 2022.  No new sign of injury or infarct.  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  Having some incontinence, wearing pads.  Not just a dribble, but larger volume loss.   Wanted to know if OTC fluticasone  is OK. Ws getting as a Rx.      ROS    Objective:     BP 130/78   Pulse 68   Ht 5' 7 (1.702 m)   Wt 187 lb (84.8 kg)   SpO2 98%   BMI 29.29 kg/m    Physical Exam Vitals and nursing note reviewed.  Constitutional:      Appearance: Normal appearance.  HENT:     Head: Normocephalic and atraumatic.  Eyes:     Conjunctiva/sclera: Conjunctivae normal.  Cardiovascular:     Rate and Rhythm: Normal rate and regular rhythm.  Pulmonary:     Effort: Pulmonary effort is normal.     Breath sounds: Normal breath sounds.  Skin:    General: Skin is warm and dry.  Neurological:     Mental Status: She is alert.  Psychiatric:        Mood and Affect: Mood normal.      No results found for any visits on 08/08/23.    The ASCVD Risk score (Arnett DK, et al., 2019) failed to calculate for the following reasons:   The 2019 ASCVD risk score is only valid for ages 53 to 76   Risk score cannot be calculated because  patient has a medical history suggesting prior/existing ASCVD    Assessment & Plan:   Problem List Items Addressed This Visit       Cardiovascular and Mediastinum   Essential hypertension, benign - Primary   Well controlled. Continue current regimen. Follow up in  70mo       Relevant Orders   CMP14+EGFR   Lipid panel   CBC     Respiratory   OSA (obstructive sleep apnea)   Doing well with her CPAP.  Still struggles occassionally       Chronic interstitial lung disease (HCC)   She still gets a little short of breath with activities but overall is doing well she has not had any flares or exacerbations.  She does have an albuterol  inhaler at home to use as needed.        Nervous and Auditory   Mild vascular neurocognitive disorder   Continue amantadine. MoCA score 23 out of 30 today. This is actually pretty stable she actually scored a 19 back in January 2024.  So she stayed pretty stable since 2022 in regards to her MoCA score.  She has great home support.  Did encourage her to work on increasing  her activity level.  She does have some good foot pedals at home and encouraged her to use those.        Other   Urge incontinence   We did discuss that she may want to consider transitioning from just pads to depends and she is soaking them pretty thoroughly.  We also discussed that there are sometimes some other options that might be helpful such as Botox that could be a consideration she would need to be evaluated for that.  For now if she is managing since she is not having any skin breakdown or frequent UTIs then can continue with absorbent underwear.      Hyperlipidemia LDL goal <100   Due for updaed lipid panel. On Lipitor.       Relevant Orders   CMP14+EGFR   Lipid panel   CBC    Return in about 6 months (around 02/08/2024) for Hypertension.    Dorothyann Byars, MD

## 2023-08-09 ENCOUNTER — Ambulatory Visit: Payer: Self-pay | Admitting: Family Medicine

## 2023-08-09 DIAGNOSIS — L821 Other seborrheic keratosis: Secondary | ICD-10-CM | POA: Diagnosis not present

## 2023-08-09 DIAGNOSIS — C44622 Squamous cell carcinoma of skin of right upper limb, including shoulder: Secondary | ICD-10-CM | POA: Diagnosis not present

## 2023-08-09 DIAGNOSIS — L820 Inflamed seborrheic keratosis: Secondary | ICD-10-CM | POA: Diagnosis not present

## 2023-08-09 DIAGNOSIS — Z85828 Personal history of other malignant neoplasm of skin: Secondary | ICD-10-CM | POA: Diagnosis not present

## 2023-08-09 DIAGNOSIS — L570 Actinic keratosis: Secondary | ICD-10-CM | POA: Diagnosis not present

## 2023-08-09 LAB — CBC
Hematocrit: 41.4 % (ref 34.0–46.6)
Hemoglobin: 13.3 g/dL (ref 11.1–15.9)
MCH: 33.1 pg — ABNORMAL HIGH (ref 26.6–33.0)
MCHC: 32.1 g/dL (ref 31.5–35.7)
MCV: 103 fL — ABNORMAL HIGH (ref 79–97)
Platelets: 221 x10E3/uL (ref 150–450)
RBC: 4.02 x10E6/uL (ref 3.77–5.28)
RDW: 11.2 % — ABNORMAL LOW (ref 11.7–15.4)
WBC: 8.1 x10E3/uL (ref 3.4–10.8)

## 2023-08-09 LAB — LIPID PANEL
Chol/HDL Ratio: 2.4 ratio (ref 0.0–4.4)
Cholesterol, Total: 146 mg/dL (ref 100–199)
HDL: 61 mg/dL (ref >39)
LDL Chol Calc (NIH): 68 mg/dL (ref 0–99)
Triglycerides: 94 mg/dL (ref 0–149)
VLDL Cholesterol Cal: 17 mg/dL (ref 5–40)

## 2023-08-09 LAB — CMP14+EGFR
ALT: 10 IU/L (ref 0–32)
AST: 21 IU/L (ref 0–40)
Albumin: 4.3 g/dL (ref 3.7–4.7)
Alkaline Phosphatase: 115 IU/L (ref 44–121)
BUN/Creatinine Ratio: 9 — ABNORMAL LOW (ref 12–28)
BUN: 9 mg/dL (ref 8–27)
Bilirubin Total: 0.3 mg/dL (ref 0.0–1.2)
CO2: 22 mmol/L (ref 20–29)
Calcium: 9.9 mg/dL (ref 8.7–10.3)
Chloride: 100 mmol/L (ref 96–106)
Creatinine, Ser: 0.97 mg/dL (ref 0.57–1.00)
Globulin, Total: 2.5 g/dL (ref 1.5–4.5)
Glucose: 100 mg/dL — ABNORMAL HIGH (ref 70–99)
Potassium: 4.4 mmol/L (ref 3.5–5.2)
Sodium: 137 mmol/L (ref 134–144)
Total Protein: 6.8 g/dL (ref 6.0–8.5)
eGFR: 56 mL/min/1.73 — ABNORMAL LOW (ref >59)

## 2023-08-09 NOTE — Progress Notes (Signed)
 Hi Mrs. Smethers, your kidney function is stable. MCV is elevated so we will cal the lab and have them add a folic acid and B12 level.  Cholesteol looks good.

## 2023-08-12 LAB — SPECIMEN STATUS REPORT

## 2023-08-12 LAB — FOLATE: Folate: 17.6 ng/mL (ref >3.0)

## 2023-08-12 LAB — VITAMIN B12: Vitamin B-12: 413 pg/mL (ref 232–1245)

## 2023-08-15 NOTE — Progress Notes (Signed)
 Hi Michelle [name], we did have the lab add on a folic acid level just to make sure that it was normal it looks great.  Again B12 also looked great.

## 2023-08-22 DIAGNOSIS — Z1231 Encounter for screening mammogram for malignant neoplasm of breast: Secondary | ICD-10-CM | POA: Diagnosis not present

## 2023-08-26 ENCOUNTER — Other Ambulatory Visit: Payer: Self-pay | Admitting: Hematology and Oncology

## 2023-09-03 ENCOUNTER — Other Ambulatory Visit: Payer: Self-pay | Admitting: Physician Assistant

## 2023-09-03 DIAGNOSIS — I10 Essential (primary) hypertension: Secondary | ICD-10-CM

## 2023-09-10 ENCOUNTER — Other Ambulatory Visit: Payer: Self-pay | Admitting: Family Medicine

## 2023-09-11 ENCOUNTER — Other Ambulatory Visit: Payer: Self-pay | Admitting: Hematology and Oncology

## 2023-10-03 ENCOUNTER — Encounter: Payer: Self-pay | Admitting: Sports Medicine

## 2023-10-06 ENCOUNTER — Encounter: Payer: Self-pay | Admitting: Family Medicine

## 2023-10-06 MED ORDER — LORATADINE 10 MG PO TABS: 10.0000 mg | ORAL_TABLET | Freq: Every day | ORAL | 3 refills | Status: AC

## 2023-10-07 ENCOUNTER — Ambulatory Visit
Admission: EM | Admit: 2023-10-07 | Discharge: 2023-10-07 | Disposition: A | Discharge status: Home / Self Care | Attending: Family Medicine | Admitting: Family Medicine

## 2023-10-07 ENCOUNTER — Ambulatory Visit (INDEPENDENT_AMBULATORY_CARE_PROVIDER_SITE_OTHER)

## 2023-10-07 DIAGNOSIS — R059 Cough, unspecified: Secondary | ICD-10-CM | POA: Diagnosis not present

## 2023-10-07 DIAGNOSIS — R0602 Shortness of breath: Secondary | ICD-10-CM | POA: Diagnosis not present

## 2023-10-07 DIAGNOSIS — R062 Wheezing: Secondary | ICD-10-CM | POA: Diagnosis not present

## 2023-10-07 DIAGNOSIS — R079 Chest pain, unspecified: Secondary | ICD-10-CM | POA: Diagnosis not present

## 2023-10-07 DIAGNOSIS — J069 Acute upper respiratory infection, unspecified: Secondary | ICD-10-CM

## 2023-10-07 MED ORDER — AMOXICILLIN-POT CLAVULANATE 875-125 MG PO TABS: 1.0000 | ORAL_TABLET | Freq: Two times a day (BID) | ORAL | 0 refills | Status: DC

## 2023-10-07 MED ORDER — PREDNISONE 20 MG PO TABS: ORAL_TABLET | ORAL | 0 refills | Status: DC

## 2023-10-07 MED ORDER — BENZONATATE 200 MG PO CAPS: 200.0000 mg | ORAL_CAPSULE | Freq: Three times a day (TID) | ORAL | 0 refills | Status: AC | PRN

## 2023-10-07 NOTE — Discharge Instructions (Addendum)
 Advised patient/daughter take medications as directed with food to completion.  Advised take prednisone  with first dose of Augmentin  for the next 5 of 7 days.  Advised may use Tessalon  capsules daily or as needed for cough.  Encouraged to increase daily water intake to 64 ounces per day while taking these medications.

## 2023-10-07 NOTE — ED Provider Notes (Signed)
 [name]    CSN: [id number] Arrival date & time: 10/07/23  1316      History   Chief Complaint Chief Complaint  Patient presents with   Pleurisy   Shortness of Breath   Cough    HPI Michelle [name] is a 89 y.o. female.   HPI Very pleasant 89 year old female presents with lower chest pain and lungs with coughing, shortness of breath and sneezing for 3 days and believes that she may have an upper respiratory infection.  PMH significant for AAA, TIA, COPD, and chronic interstitial lung disease.  Patient is accompanied by her daughter this afternoon.  Patient is currently on Plavix  and denies any unusual bleeding  Past Medical History:  Diagnosis Date   Abdominal aortic aneurysm (AAA) 3.0 cm to 5.0 cm in diameter in female 11/18/2016   Due for repeat in 12/2019.   Abnormal hemoglobin 08/25/2022   Allergic rhinitis 05/26/2020   Aneurysm of infrarenal abdominal aorta 11/19/2013   2.6 cm in 2006 on CT.   CT 10/15 show 3 cm lesion.   US   2018 - no change  CT 2020 - 2.9 cm, stable      Arthritis    hips, lower back   Basal cell carcinoma 2011   R back   Bell's palsy 1984   Bilateral cold feet 03/28/2019   Blood transfusion without reported diagnosis 07/2015   Bradycardia    Cataract    Cholecystitis 2004   Chronic constipation 08/14/2018   Clotting disorder    Complication of anesthesia    COPD (chronic obstructive pulmonary disease)    Essential hypertension, benign 09/26/2007   Genetic testing 09/16/2021   Negative hereditary cancer genetic testing: no pathogenic variants detected in CancerNext-Expanded +RNAinsight Panel.  Report date is September 06, 2021.      The CancerNext-Expanded gene panel offered by [facility] and includes sequencing, rearrangement, and RNA analysis for the following 77 genes: AIP, ALK, APC, ATM, AXIN2, BAP1, BARD1, BLM, BMPR1A, BRCA1, BRCA2, BRIP1, CDC73, CDH1, CDK4, CDKN1B   GERD 11/03/2005   Hiatal hernia 2008   History of CVA  (cerebrovascular accident) 09/24/2007   left cerebellar infarction, PICA territory   History of endometrial cancer 04/30/2013   uterine   History of GI bleed 07/04/2015   Hyperlipidemia 11/03/2005   LDL goal <100   Interstitial lung disease 03/28/2019   Iron deficiency anemia, unspecified    Low serum vitamin B12 08/26/2021   Lymphedema of both lower extremities 08/25/2022   Macular degeneration    Malignant neoplasm of upper-outer quadrant of right breast in female, estrogen receptor positive 02/21/2011   Mild vascular neurocognitive disorder 05/14/2021   OSA (obstructive sleep apnea) 04/28/2022   04/2022: HST, AHI 61.9/hr, no central apneas.  Set to 8 cm water pressure based on AutoPap results.     Osteoporosis 11/19/2014   DEXA 10/27/14.  Repeat in 2 yr.  High risk to start medication.    Polyp of colon 2008, 2014   Benign   PONV (postoperative nausea and vomiting)    Primary osteoarthritis of right knee 06/22/2015   Senile purpura 10/12/2022   Shingles 2008   Squamous cell skin cancer, face 03/22/2012   Actively seeing dermatology.   Tendonitis    rt hand   Venous stasis of both lower extremities 08/14/2018   Vocal cord polyp     Patient Active Problem List   Diagnosis Date Noted   Urge incontinence 08/08/2023   Senile purpura 10/12/2022  Abnormal hemoglobin 08/25/2022   Lymphedema of both lower extremities 08/25/2022   OSA (obstructive sleep apnea) 04/28/2022   Genetic testing 09/16/2021   Low serum vitamin B12 08/26/2021   Mild vascular neurocognitive disorder 05/14/2021   Allergic rhinitis 05/26/2020   Chronic interstitial lung disease (HCC) 03/28/2019   Bilateral cold feet 03/28/2019   Chronic constipation 08/14/2018   Venous stasis of both lower extremities 08/14/2018   Abdominal aortic aneurysm (AAA) 3.0 cm to 5.0 cm in diameter in female 11/18/2016   Iron deficiency anemia, unspecified 07/06/2015   Primary osteoarthritis of right knee 06/22/2015    Osteoporosis 11/19/2014   Aneurysm of infrarenal abdominal aorta 11/19/2013   History of endometrial cancer 04/30/2013   Macular degeneration 09/13/2011   Malignant neoplasm of upper-outer quadrant of right breast in female, estrogen receptor positive 02/21/2011   Essential hypertension, benign 09/26/2007   History of CVA (cerebrovascular accident) 09/24/2007   Hyperlipidemia LDL goal <100 11/03/2005   GERD 11/03/2005    Past Surgical History:  Procedure Laterality Date   ABDOMINAL HYSTERECTOMY  2005   APPENDECTOMY     BASAL CELL CARCINOMA EXCISION     biospy  10/28/2009   Shave Biospy skin Left hand(Acitinic Keratoses), Right Upper Arm (superficial Basal Cell), Right Upper Back(Superficial Basal Cell), Left Neck ( Solar Lentigo & Seborrheic Keratoses)   BREAST BIOPSY  02/11/2011   Right Breast Needle Core Biospy - Upper Outer Quadrant; ER/PR 100%, Her-2 Neu neg.; Ki-67 10%   BREAST LUMPECTOMY WITH RADIOACTIVE SEED LOCALIZATION Right 06/12/2018   Procedure: RIGHT BREAST LUMPECTOMY WITH RADIOACTIVE SEED LOCALIZATION;  Surgeon: Ebbie Cough, MD;  Location: [facility];  Service: General;  Laterality: Right;   BREAST MAMMOSITE  03/14/2011   Procedure: MAMMOSITE BREAST;  Surgeon: Cough Ebbie, MD;  Location: [facility];  Service: General;  Laterality: Right;   BREAST SURGERY     right breast lumpectomy snbx   CHOLECYSTECTOMY  2004   COLONOSCOPY     COLONOSCOPY W/ POLYPECTOMY     2008, 2014 (benign)   COLONOSCOPY WITH PROPOFOL  N/A 07/09/2015   Procedure: COLONOSCOPY WITH PROPOFOL ;  Surgeon: Lesta JULIANNA Fitz, MD;  Location: [facility] ENDOSCOPY;  Service: Endoscopy;  Laterality: N/A;   DILATION AND CURETTAGE OF UTERUS N/A 04/24/2013   Procedure: DILATATION AND CURETTAGE;  Surgeon: Burnard VEAR Pate, MD;  Location: [facility];  Service: Gynecology;  Laterality: N/A;   ESOPHAGOGASTRODUODENOSCOPY N/A 07/06/2015   Procedure: ESOPHAGOGASTRODUODENOSCOPY (EGD);  Surgeon:  Jerrell Sol, MD;  Location: [facility] ENDOSCOPY;  Service: Endoscopy;  Laterality: N/A;   HYSTEROSCOPY N/A 04/24/2013   Procedure: HYSTEROSCOPY;  Surgeon: Burnard VEAR Pate, MD;  Location: [facility];  Service: Gynecology;  Laterality: N/A;   ROBOTIC ASSISTED TOTAL HYSTERECTOMY WITH BILATERAL SALPINGO OOPHERECTOMY Bilateral 06/11/2013   Procedure: ROBOTIC ASSISTED TOTAL HYSTERECTOMY WITH BILATERAL SALPINGO OOPHORECTOMY WITH POSSIBLE STAGING, EPISIOTOMY;  Surgeon: Sari Bachelor, MD;  Location: [facility];  Service: Gynecology;  Laterality: Bilateral;   SQUAMOUS CELL CARCINOMA EXCISION     TEAR DUCT PROBING     TONSILLECTOMY     vocal cord poylps  1969   excision    OB History     Gravida  2   Para  2   Term  2   Preterm      AB      Living  2      SAB      IAB      Ectopic      Multiple  Live Births               Home Medications    Prior to Admission medications   Medication Sig Start Date End Date Taking? Authorizing Provider  amoxicillin -clavulanate (AUGMENTIN ) 875-125 MG tablet Take 1 tablet by mouth every 12 (twelve) hours. 10/07/23  Yes Teddy Sharper, FNP  benzonatate  (TESSALON ) 200 MG capsule Take 1 capsule (200 mg total) by mouth 3 (three) times daily as needed for up to 7 days. 10/07/23 10/14/23 Yes Teddy Sharper, FNP  predniSONE  (DELTASONE ) 20 MG tablet Take 3 tabs PO daily x 5 days. 10/07/23  Yes Teddy Sharper, FNP  acetaminophen  (TYLENOL ) 325 MG tablet Take 650 mg by mouth as needed for mild pain.     [provider]  albuterol  (VENTOLIN  HFA) 108 (90 Base) MCG/ACT inhaler Inhale 2 puffs into the lungs every 6 (six) hours as needed for wheezing or shortness of breath. 03/24/23   Willo Mini, NP  AMBULATORY NON FORMULARY MEDICATION Medication Name: Set CPAP to 8-11 mm HG water pressure. Please send down load after 4 weeks of use to [phone]. 08/31/22   [name], MD  atorvastatin  (LIPITOR) 20 MG tablet TAKE 1 TABLET BY MOUTH EVERY  OTHER  NIGHT 05/31/23   [name], MD  cholecalciferol (VITAMIN D3) 25 MCG (1000 UT) tablet Take 1,000 Units by mouth daily.    [provider]  clopidogrel  (PLAVIX ) 75 MG tablet TAKE 1 TABLET BY MOUTH DAILY 09/11/23   [name], MD  famotidine  (PEPCID ) 20 MG tablet TAKE 1 TABLET BY MOUTH TWICE  DAILY 05/31/23   [name], MD  loratadine  (CLARITIN ) 10 MG tablet Take 1 tablet (10 mg total) by mouth daily. 10/06/23   [name], MD  losartan  (COZAAR ) 25 MG tablet TAKE 1 TABLET BY MOUTH DAILY 05/31/23   [name], MD  memantine  (NAMENDA ) 10 MG tablet TAKE 1 TABLET BY MOUTH TWICE  DAILY 09/04/23   Dina, Sara E, PA-C  Multiple Vitamins-Minerals (ICAPS AREDS 2 PO) Take 1 capsule by mouth 2 (two) times daily at 10 AM and 5 PM. Morning and evening    [provider]  VIACTIV 500-500-40 MG-UNT-MCG CHEW CHEW 1 TABLET BY MOUTH EVERY MORNING. 09/16/14   [provider]    Family History Family History  Problem Relation Age of Onset   Heart disease Father    Cancer Sister 7       Lymphoma   Breast cancer Sister    Cancer Paternal Aunt        Breast cancer   Cancer Daughter 71       Bilateral Breast Cancer   Breast cancer Daughter 72   Cancer Daughter        Breast Cancer   Breast cancer Daughter    Cancer Sister        2005 - Renal Cell Cancer and Breast Cancer   Breast cancer Sister     Social History Social History   Tobacco Use   Smoking status: Former    Current packs/day: 0.00    Average packs/day: 1 pack/day for 30.0 years (30.0 ttl pk-yrs)    Types: Cigarettes    Start date: 01/31/1966    Quit date: 02/01/1996    Years since quitting: 27.6   Smokeless tobacco: Never  Vaping Use   Vaping status: Never Used  Substance Use Topics   Alcohol use: Not Currently   Drug use: No     Allergies  Benadryl [diphenhydramine hcl], Doxycycline, Simvastatin, Fosamax  [alendronate  sodium], Lisinopril , and  Nsaids   Review of Systems Review of Systems  Respiratory:  Positive for cough, chest tightness and shortness of breath.   All other systems reviewed and are negative.    Physical Exam Triage Vital Signs ED Triage Vitals  Encounter Vitals Group     BP      Girls Systolic BP Percentile      Girls Diastolic BP Percentile      Boys Systolic BP Percentile      Boys Diastolic BP Percentile      Pulse      Resp      Temp      Temp src      SpO2      Weight      Height      Head Circumference      Peak Flow      Pain Score      Pain Loc      Pain Education      Exclude from Growth Chart    No data found.  Updated Vital Signs BP 123/71   Pulse 84   Temp 98.1 F (36.7 C)   Resp 19   SpO2 98%    Physical Exam Vitals and nursing note reviewed.  Constitutional:      Appearance: Normal appearance. She is normal weight. She is ill-appearing.  HENT:     Head: Normocephalic and atraumatic.     Right Ear: Tympanic membrane, ear canal and external ear normal.     Left Ear: Tympanic membrane, ear canal and external ear normal.     Nose: Nose normal.     Mouth/Throat:     Mouth: Mucous membranes are moist.     Pharynx: Oropharynx is clear.  Eyes:     Extraocular Movements: Extraocular movements intact.     Conjunctiva/sclera: Conjunctivae normal.     Pupils: Pupils are equal, round, and reactive to light.  Cardiovascular:     Rate and Rhythm: Normal rate and regular rhythm.     Pulses: Normal pulses.     Heart sounds: Normal heart sounds.  Pulmonary:     Effort: Pulmonary effort is normal.     Breath sounds: Normal breath sounds. No wheezing, rhonchi or rales.  Musculoskeletal:        General: Normal range of motion.  Skin:    General: Skin is warm and dry.  Neurological:     General: No focal deficit present.     Mental Status: She is alert and oriented to person, place, and time. Mental status is at baseline.  Psychiatric:        Mood and Affect: Mood normal.         Behavior: Behavior normal.      UC Treatments / Results  Labs (all labs ordered are listed, but only abnormal results are displayed) Labs Reviewed - No data to display  EKG   Radiology DG Chest 2 View Result Date: 10/07/2023 CLINICAL DATA:  Cough, wheezing and shortness of breath. Chest pain. EXAM: CHEST - 2 VIEW COMPARISON:  12/26/2022. FINDINGS: Trachea is midline. Heart size within normal limits. Coarsened peripheral and basilar predominant pulmonary markings, unchanged. No superimposed airspace consolidation or pleural fluid. IMPRESSION: 1. No acute findings. 2. Fibrotic interstitial lung disease. If further evaluation is desired, pulmonary medicine referral and nonemergent high-resolution chest CT without contrast is recommended. Electronically Signed   By: Newell Eke M.D.   On:  10/07/2023 14:13    Procedures Procedures (including critical care time)  Medications Ordered in UC Medications - No data to display  Initial Impression / Assessment and Plan / UC Course  I have reviewed the triage vital signs and the nursing notes.  Pertinent labs & imaging results that were available during my care of the patient were reviewed by me and considered in my medical decision making (see chart for details).     MDM: 1.  Acute URI -Rx'd Augmentin  875/125 mg tablet: Take 1 tablet twice daily x 7 days; 2.  Cough, unspecified type-chest x-ray results revealed above patient and daughter advised.  Rx'd prednisone  20 mg tablet: Take 3 tablets p.o. daily x 5 days. Advised patient/daughter take medications as directed with food to completion.  Advised take prednisone  with first dose of Augmentin  for the next 5 of 7 days.  Advised may use Tessalon  capsules daily or as needed for cough.  Encouraged to increase daily water intake to 64 ounces per day while taking these medications.  Patient discharged home, hemodynamically stable. Final Clinical Impressions(s) / UC Diagnoses   Final  diagnoses:  Cough, unspecified type  Acute URI     Discharge Instructions      Advised patient/daughter take medications as directed with food to completion.  Advised take prednisone  with first dose of Augmentin  for the next 5 of 7 days.  Advised may use Tessalon  capsules daily or as needed for cough.  Encouraged to increase daily water intake to 64 ounces per day while taking these medications.     ED Prescriptions     Medication Sig Dispense Auth. Provider   amoxicillin -clavulanate (AUGMENTIN ) 875-125 MG tablet Take 1 tablet by mouth every 12 (twelve) hours. 14 tablet Eliese Kerwood, FNP   predniSONE  (DELTASONE ) 20 MG tablet Take 3 tabs PO daily x 5 days. 15 tablet Deborah Lazcano, FNP   benzonatate  (TESSALON ) 200 MG capsule Take 1 capsule (200 mg total) by mouth 3 (three) times daily as needed for up to 7 days. 40 capsule Phung Kotas, FNP      PDMP not reviewed this encounter.   Teddy Sharper, FNP 10/07/23 1444

## 2023-10-07 NOTE — ED Triage Notes (Signed)
 Pt presents to uc with co lower chest pain (lower lungs) pt reports pmh of interstitial lung disease. Pt repots she has had URI for 3 days with sneezing, coughing, sob. Pt has been using her asthma medication as well as an antihistamine.

## 2023-10-17 ENCOUNTER — Ambulatory Visit: Admitting: Physician Assistant

## 2023-11-15 ENCOUNTER — Ambulatory Visit: Payer: Self-pay

## 2023-11-15 NOTE — Telephone Encounter (Signed)
 FYI Only or Action Required?: FYI only for provider.  Patient was last seen in primary care on 08/08/2023 by [name], MD.  Called Nurse Triage reporting Back Pain.  Symptoms began a week ago.  Interventions attempted: OTC medications: tylenol .  Symptoms are: gradually worsening.  Triage Disposition: See PCP When Office is Open (Within 3 Days)  Patient/caregiver understands and will follow disposition?: Yes Reason for Disposition  [1] Age > 50 AND [2] no history of prior similar back pain  Answer Assessment - Initial Assessment Questions Pain in lower right back that radiates to her right hip. Hx of osteoarthritis. Daughter in law is an Charity fundraiser and states she does not believe it is sciatica. Pain is worse when sitting. Has been taking tylenol  PRN. Does not take NSAIDs d/t plavix . Denies recent hx of fall or other trauma. Denies dysuria. Patient denies higher acuity questions. ED precautions reviewed, pt verbalized understanding.   1. ONSET: When did the pain begin? (e.g., minutes, hours, days)     One week  2. LOCATION: Where does it hurt? (upper, mid or lower back)     Lower right back to right hip  3. SEVERITY: How bad is the pain?  (e.g., Scale 1-10; mild, moderate, or severe)     Moderate  4. PATTERN: Is the pain constant? (e.g., yes, no; constant, intermittent)      Changes with position  5. RADIATION: Does the pain shoot into your legs or somewhere else?     Right hip  6. CAUSE:  What do you think is causing the back pain?      Unknown  7. BACK OVERUSE:  Any recent lifting of heavy objects, strenuous work or exercise?     Denies  8. MEDICINES: What have you taken so far for the pain? (e.g., nothing, acetaminophen , NSAIDS)     Tylenol    9. NEUROLOGIC SYMPTOMS: Do you have any weakness, numbness, or problems with bowel/bladder control?     Denies  10. OTHER SYMPTOMS: Do you have any other symptoms? (e.g., fever, abdomen pain, burning with  urination, blood in urine)       Denies  Protocols used: Back Pain-A-AH Copied from CRM #[id number]. Topic: Clinical - Red Word Triage >> Nov 15, 2023  2:22 PM Donna BRAVO wrote: Red Word that prompted transfer to Nurse Triage: patient daughter Montie calling, patient has radiating pain lower lumbar to right hip, going on for over a week, painful to move, when getting up out of chair it hurts.

## 2023-11-16 ENCOUNTER — Ambulatory Visit (INDEPENDENT_AMBULATORY_CARE_PROVIDER_SITE_OTHER): Admitting: Family Medicine

## 2023-11-16 ENCOUNTER — Encounter: Payer: Self-pay | Admitting: Family Medicine

## 2023-11-16 VITALS — BP 155/72 | HR 73 | Ht 67.0 in | Wt 189.0 lb

## 2023-11-16 DIAGNOSIS — R2689 Other abnormalities of gait and mobility: Secondary | ICD-10-CM

## 2023-11-16 DIAGNOSIS — R300 Dysuria: Secondary | ICD-10-CM

## 2023-11-16 DIAGNOSIS — M545 Low back pain, unspecified: Secondary | ICD-10-CM

## 2023-11-16 LAB — POCT URINALYSIS DIP (CLINITEK)
Bilirubin, UA: NEGATIVE
Blood, UA: NEGATIVE
Glucose, UA: NEGATIVE mg/dL
Ketones, POC UA: NEGATIVE mg/dL
Leukocytes, UA: NEGATIVE
Nitrite, UA: NEGATIVE
POC PROTEIN,UA: NEGATIVE
Spec Grav, UA: 1.025 (ref 1.010–1.025)
Urobilinogen, UA: 0.2 U/dL
pH, UA: 6 (ref 5.0–8.0)

## 2023-11-16 NOTE — Progress Notes (Signed)
 Acute Office Visit  Subjective:     Patient ID: Michelle [name], female    DOB: May 08, 1932, 89 y.o.   MRN: [id number]  Chief Complaint  Patient presents with   Back Pain    Right side flank pain onset 3 weeks after  reaching down to grab luggage , the pain radiates sciatic nerve to the lower back     HPI Patient is in today for   Discussed the use of AI scribe software for clinical note transcription with the patient, who gave verbal consent to proceed.  History of Present Illness Michelle [name] is an 89 year old female with arthritis and degenerative disc disease who presents with back pain and balance issues.  Low back pain - Onset approximately three weeks ago - Described as a grabbing sensation - Primarily located at the waistline and tailbone - Pain radiates to the sacroiliac joint and piriformis area at times - Pain affects posture and balance, especially when getting up and walking - Uses Biofreeze for symptomatic relief - Takes 500 mg of Tylenol  at night - History of a similar back issue in October of the previous year - No engagement in home stretches or exercises  Gait instability and balance impairment - Recent onset of balance issues, first noticed last week and again this week - Sensation of almost falling occurs during movement, not when standing still - No dizziness or vertigo  Urinary incontinence - Wears a pad for urinary incontinence - Desires improved management of incontinence - Aware of Kegel exercises but not actively performing them   ROS      Objective:    BP (!) 155/72   Pulse 73   Ht 5' 7 (1.702 m)   Wt 189 lb (85.7 kg)   SpO2 99%   BMI 29.60 kg/m  BP Readings from Last 3 Encounters:  11/16/23 (!) 155/72  10/07/23 123/71  08/08/23 130/78      Physical Exam Vitals and nursing note reviewed.  Constitutional:      Appearance: Normal appearance.  HENT:     Head: Normocephalic and atraumatic.  Eyes:     Conjunctiva/sclera:  Conjunctivae normal.  Cardiovascular:     Rate and Rhythm: Normal rate and regular rhythm.  Pulmonary:     Effort: Pulmonary effort is normal.     Breath sounds: Normal breath sounds.  Musculoskeletal:     Comments: Tender over the left SI joint and tender over the right low back just above the hip.  Tender down into the piriformis buttocks area.  Negative straight leg raise hip, knee, ankle strength is 5 out of 5.  Though she did have pain in her right low back with lifting her right knee.  Patellar reflexes 0+.  Skin:    General: Skin is warm and dry.  Neurological:     Mental Status: She is alert.  Psychiatric:        Mood and Affect: Mood normal.     Results for orders placed or performed in visit on 11/16/23  POCT URINALYSIS DIP (CLINITEK)  Result Value Ref Range   Color, UA yellow yellow   Clarity, UA clear clear   Glucose, UA negative negative mg/dL   Bilirubin, UA negative negative   Ketones, POC UA negative negative mg/dL   Spec Grav, UA 8.974 8.989 - 1.025   Blood, UA negative negative   pH, UA 6.0 5.0 - 8.0   POC PROTEIN,UA negative negative, trace   Urobilinogen, UA 0.2  0.2 or 1.0 E.U./dL   Nitrite, UA Negative Negative   Leukocytes, UA Negative Negative        Assessment & Plan:   Problem List Items Addressed This Visit   None Visit Diagnoses       Acute right-sided low back pain without sciatica    -  Primary   Relevant Orders   Ambulatory referral to Physical Therapy     Balance problem         Dysuria       Relevant Orders   POCT URINALYSIS DIP (CLINITEK) (Completed)   Urine Culture       Assessment and Plan Assessment & Plan Low back pain with underlying lumbar spondylosis and degenerative disc disease Chronic low back pain exacerbated, involving piriformis muscle and SI joint. No radicular symptoms. Likely due to muscle tightness and inflammation around degenerative changes. - Refer to physical therapy for muscle relaxation and  strengthening. - Recommend Biofreeze for symptomatic relief. - Advise heat or ice use based on preference. - Increase Tylenol  to 500 mg twice daily for one week. - Provide home exercises until physical therapy begins. - Consider x-ray if no improvement in two weeks.  Balance disturbance Recent balance disturbance without dizziness or vertigo. Possible orthostatic hypotension considered. - Check blood pressure in lying, sitting, and standing positions.  Urinary incontinence Urinary incontinence requiring pads. Discussed potential benefit of Kegel exercises for pelvic floor strengthening. - Provide handout on Kegel exercises.    No orders of the defined types were placed in this encounter.   No follow-ups on file.  Dorothyann Byars, MD

## 2023-11-16 NOTE — Patient Instructions (Addendum)
 Ok to take you Tylenol  ES twice a day for the next week.

## 2023-11-19 LAB — URINE CULTURE

## 2023-11-20 ENCOUNTER — Ambulatory Visit: Payer: Self-pay | Admitting: Physician Assistant

## 2023-11-20 NOTE — Progress Notes (Signed)
No significant bacteria found in urine.

## 2023-11-22 ENCOUNTER — Ambulatory Visit: Attending: Family Medicine | Admitting: Physical Therapy

## 2023-11-22 ENCOUNTER — Other Ambulatory Visit: Payer: Self-pay

## 2023-11-22 ENCOUNTER — Encounter: Payer: Self-pay | Admitting: Physical Therapy

## 2023-11-22 DIAGNOSIS — M5459 Other low back pain: Secondary | ICD-10-CM | POA: Diagnosis present

## 2023-11-22 DIAGNOSIS — M6281 Muscle weakness (generalized): Secondary | ICD-10-CM | POA: Diagnosis present

## 2023-11-22 DIAGNOSIS — M545 Low back pain, unspecified: Secondary | ICD-10-CM | POA: Diagnosis not present

## 2023-11-22 NOTE — Therapy (Signed)
 OUTPATIENT PHYSICAL THERAPY THORACOLUMBAR EVALUATION   Patient Name: Michelle [name] MRN: [id number] DOB:[birth year], 89 y.o., female Today's Date: 11/22/2023  END OF SESSION:  PT End of Session - 11/22/23 1455     Visit Number 1    Number of Visits 16    Date for Recertification  01/17/24    Authorization Type [employer] Medicare    Progress Note Due on Visit 10    PT Start Time 1400    PT Stop Time 1444    PT Time Calculation (min) 44 min    Activity Tolerance Patient tolerated treatment well    Behavior During Therapy [facility] for tasks assessed/performed          Past Medical History:  Diagnosis Date   Abdominal aortic aneurysm (AAA) 3.0 cm to 5.0 cm in diameter in female 11/18/2016   Due for repeat in 12/2019.   Abnormal hemoglobin 08/25/2022   Allergic rhinitis 05/26/2020   Aneurysm of infrarenal abdominal aorta 11/19/2013   2.6 cm in 2006 on CT.   CT 10/15 show 3 cm lesion.   US   2018 - no change  CT 2020 - 2.9 cm, stable      Arthritis    hips, lower back   Basal cell carcinoma 2011   R back   Bell's palsy 1984   Bilateral cold feet 03/28/2019   Blood transfusion without reported diagnosis 07/2015   Bradycardia    Cataract    Cholecystitis 2004   Chronic constipation 08/14/2018   Clotting disorder    Complication of anesthesia    COPD (chronic obstructive pulmonary disease)    Essential hypertension, benign 09/26/2007   Genetic testing 09/16/2021   Negative hereditary cancer genetic testing: no pathogenic variants detected in CancerNext-Expanded +RNAinsight Panel.  Report date is September 06, 2021.      The CancerNext-Expanded gene panel offered by [facility] and includes sequencing, rearrangement, and RNA analysis for the following 77 genes: AIP, ALK, APC, ATM, AXIN2, BAP1, BARD1, BLM, BMPR1A, BRCA1, BRCA2, BRIP1, CDC73, CDH1, CDK4, CDKN1B   GERD 11/03/2005   Hiatal hernia 2008   History of CVA (cerebrovascular accident) 09/24/2007   left cerebellar infarction, PICA  territory   History of endometrial cancer 04/30/2013   uterine   History of GI bleed 07/04/2015   Hyperlipidemia 11/03/2005   LDL goal <100   Interstitial lung disease 03/28/2019   Iron deficiency anemia, unspecified    Low serum vitamin B12 08/26/2021   Lymphedema of both lower extremities 08/25/2022   Macular degeneration    Malignant neoplasm of upper-outer quadrant of right breast in female, estrogen receptor positive 02/21/2011   Mild vascular neurocognitive disorder 05/14/2021   OSA (obstructive sleep apnea) 04/28/2022   04/2022: HST, AHI 61.9/hr, no central apneas.  Set to 8 cm water pressure based on AutoPap results.     Osteoporosis 11/19/2014   DEXA 10/27/14.  Repeat in 2 yr.  High risk to start medication.    Polyp of colon 2008, 2014   Benign   PONV (postoperative nausea and vomiting)    Primary osteoarthritis of right knee 06/22/2015   Senile purpura 10/12/2022   Shingles 2008   Squamous cell skin cancer, face 03/22/2012   Actively seeing dermatology.   Tendonitis    rt hand   Venous stasis of both lower extremities 08/14/2018   Vocal cord polyp    Past Surgical History:  Procedure Laterality Date   ABDOMINAL HYSTERECTOMY  2005   APPENDECTOMY  BASAL CELL CARCINOMA EXCISION     biospy  10/28/2009   Shave Biospy skin Left hand(Acitinic Keratoses), Right Upper Arm (superficial Basal Cell), Right Upper Back(Superficial Basal Cell), Left Neck ( Solar Lentigo & Seborrheic Keratoses)   BREAST BIOPSY  02/11/2011   Right Breast Needle Core Biospy - Upper Outer Quadrant; ER/PR 100%, Her-2 Neu neg.; Ki-67 10%   BREAST LUMPECTOMY WITH RADIOACTIVE SEED LOCALIZATION Right 06/12/2018   Procedure: RIGHT BREAST LUMPECTOMY WITH RADIOACTIVE SEED LOCALIZATION;  Surgeon: Ebbie Cough, MD;  Location: [facility];  Service: General;  Laterality: Right;   BREAST MAMMOSITE  03/14/2011   Procedure: MAMMOSITE BREAST;  Surgeon: Cough Ebbie, MD;  Location: [facility];  Service: General;  Laterality: Right;   BREAST SURGERY     right breast lumpectomy snbx   CHOLECYSTECTOMY  2004   COLONOSCOPY     COLONOSCOPY W/ POLYPECTOMY     2008, 2014 (benign)   COLONOSCOPY WITH PROPOFOL  N/A 07/09/2015   Procedure: COLONOSCOPY WITH PROPOFOL ;  Surgeon: Lesta JULIANNA Fitz, MD;  Location: [facility];  Service: Endoscopy;  Laterality: N/A;   DILATION AND CURETTAGE OF UTERUS N/A 04/24/2013   Procedure: DILATATION AND CURETTAGE;  Surgeon: Burnard VEAR Pate, MD;  Location: [facility];  Service: Gynecology;  Laterality: N/A;   ESOPHAGOGASTRODUODENOSCOPY N/A 07/06/2015   Procedure: ESOPHAGOGASTRODUODENOSCOPY (EGD);  Surgeon: Jerrell Sol, MD;  Location: [facility];  Service: Endoscopy;  Laterality: N/A;   HYSTEROSCOPY N/A 04/24/2013   Procedure: HYSTEROSCOPY;  Surgeon: Burnard VEAR Pate, MD;  Location: [facility];  Service: Gynecology;  Laterality: N/A;   ROBOTIC ASSISTED TOTAL HYSTERECTOMY WITH BILATERAL SALPINGO OOPHERECTOMY Bilateral 06/11/2013   Procedure: ROBOTIC ASSISTED TOTAL HYSTERECTOMY WITH BILATERAL SALPINGO OOPHORECTOMY WITH POSSIBLE STAGING, EPISIOTOMY;  Surgeon: Sari Bachelor, MD;  Location: [facility];  Service: Gynecology;  Laterality: Bilateral;   SQUAMOUS CELL CARCINOMA EXCISION     TEAR DUCT PROBING     TONSILLECTOMY     vocal cord poylps  1969   excision   Patient Active Problem List   Diagnosis Date Noted   Urge incontinence 08/08/2023   Senile purpura 10/12/2022   Abnormal hemoglobin 08/25/2022   Lymphedema of both lower extremities 08/25/2022   OSA (obstructive sleep apnea) 04/28/2022   Genetic testing 09/16/2021   Low serum vitamin B12 08/26/2021   Mild vascular neurocognitive disorder 05/14/2021   Allergic rhinitis 05/26/2020   Chronic interstitial lung disease (HCC) 03/28/2019   Bilateral cold feet 03/28/2019   Chronic constipation 08/14/2018   Venous stasis of both lower extremities 08/14/2018   Abdominal aortic aneurysm (AAA)  3.0 cm to 5.0 cm in diameter in female 11/18/2016   Iron deficiency anemia, unspecified 07/06/2015   Primary osteoarthritis of right knee 06/22/2015   Osteoporosis 11/19/2014   Aneurysm of infrarenal abdominal aorta 11/19/2013   History of endometrial cancer 04/30/2013   Macular degeneration 09/13/2011   Malignant neoplasm of upper-outer quadrant of right breast in female, estrogen receptor positive 02/21/2011   Essential hypertension, benign 09/26/2007   History of CVA (cerebrovascular accident) 09/24/2007   Hyperlipidemia LDL goal <100 11/03/2005   GERD 11/03/2005    PCP: Alvan  REFERRING PROVIDER: Alvan  REFERRING DIAG: acute Rt sided low back pain without sciatica  Rationale for Evaluation and Treatment: Rehabilitation  THERAPY DIAG:  Muscle weakness (generalized)  Other low back pain  ONSET DATE: 11/06/23  SUBJECTIVE:  SUBJECTIVE STATEMENT: Pt states that at the beginning of the month she was lifting suitcases during traveling and she hurt her lower back. She used biofreeze which helped a little bit but pain continues to persist. Pain increases with bending and with sit <> stand and with prolonged walking (shopping). Pain decreases with tylenol , use of heating pad and seated rest. She initially felt pain near her tailbone but now pain is in lower Rt back.  PERTINENT HISTORY:  OA, osteoporosis  PAIN:  Are you having pain? Yes: NPRS scale: 1/10 currently, 6/10 at worst  Pain location: Rt lower back Pain description: ache, sore Aggravating factors: bending, sit <> stand Relieving factors: meds, heat  PRECAUTIONS: Other: osteoporosis  RED FLAGS: None   WEIGHT BEARING RESTRICTIONS: No  FALLS:  Has patient fallen in last 6 months? No  LIVING ENVIRONMENT: Lives with: lives with  their family Lives in: House/apartment Has following equipment at home: None  OCCUPATION: retired - lives with daughter  PLOF: Independent and Independent with basic ADLs  PATIENT GOALS: get rid of this pain  NEXT MD VISIT: PRN  OBJECTIVE:  Note: Objective measures were completed at Evaluation unless otherwise noted.  DIAGNOSTIC FINDINGS:  No recent imaging on file  PATIENT SURVEYS:  PSFS: THE PATIENT SPECIFIC FUNCTIONAL SCALE  Place score of 0-10 (0 = unable to perform activity and 10 = able to perform activity at the same level as before injury or problem)  Activity Date: 11/22/23    Sit to stand 4    2.pick up item from floor 3    3.     4.      Average Score 3.5      Total Score = Sum of activity scores/number of activities  Minimally Detectable Change: 3 points (for single activity); 2 points (for average score)  [facility] (nd). The Patient Specific Functional Scale . Retrieved from [email or web address]   COGNITION: Overall cognitive status: Within functional limits for tasks assessed       POSTURE: rounded shoulders, forward head, and flexed trunk   PALPATION: TTP Rt QL, Rt lumbar paraspinals, Rt glutes  LUMBAR ROM:   AROM eval  Flexion 90% pain end range  Extension 50%  Right lateral flexion WFL  Left lateral flexion WFL  Right rotation 70%  Left rotation 70%   (Blank rows = not tested)  LOWER EXTREMITY MMT:    MMT Right eval Left eval  Hip flexion 4- pain 4-  Hip extension 3+ 3+  Hip abduction 3+ 3+  Hip adduction    Hip internal rotation    Hip external rotation    Knee flexion    Knee extension    Ankle dorsiflexion    Ankle plantarflexion    Ankle inversion    Ankle eversion     (Blank rows = not tested)   FUNCTIONAL TESTS:  30 seconds chair stand test = 4  GAIT: Distance walked: 100' Assistive device utilized: None Level of assistance: Complete  Independence Comments: decreased cadence, forward flexed trunk  TREATMENT DATE: 11/22/23 See HEP Pt educated on PT POC and goals, HEP, rationale for treatment, relevant anatomy  PATIENT EDUCATION:  Education details: PT POC and goals, HEP Person educated: Patient Education method: Explanation, Demonstration, and Handouts Education comprehension: verbalized understanding and returned demonstration  HOME EXERCISE PROGRAM: Access Code: [email or web address] URL: [email or web address] Date: 11/22/2023 Prepared by: Darice Conine  Exercises - Standing Quadratus Lumborum Stretch with Doorway  - 2 x daily - 7 x weekly - 1 sets - 3 reps - 10-15 seconds hold - Seated Figure 4 Piriformis Stretch  - 2 x daily - 7 x weekly - 1 sets - 3 reps - 20-30 seconds hold - Seated Pelvic Tilt  - 2 x daily - 7 x weekly - 1 sets - 10 reps  ASSESSMENT:  CLINICAL IMPRESSION: Patient is a 89 y.o. female who was seen today for physical therapy evaluation and treatment for Rt sided low back pain without sciatica. Pt presents with increased mm spasticity in Rt QL and lumbar paraspinals, decreased core and LE strength, decreased functional mobility and activity tolerance. Pt will benefit from skilled PT to address deficits and decrease pain with functional mobility.   OBJECTIVE IMPAIRMENTS: decreased activity tolerance, decreased mobility, decreased ROM, decreased strength, increased muscle spasms, and pain.   ACTIVITY LIMITATIONS: bending, standing, transfers, and locomotion level  PARTICIPATION LIMITATIONS: shopping and community activity  PERSONAL FACTORS: 1-2 comorbidities: osteoporosis, OA are also affecting patient's functional outcome.   REHAB POTENTIAL: Good  CLINICAL DECISION MAKING: Evolving/moderate complexity  EVALUATION COMPLEXITY: Moderate   GOALS: Goals  reviewed with patient? Yes  SHORT TERM GOALS: Target date: 12/20/2023    Pt will be independent in initial HEP Baseline: Goal status: INITIAL  2.  Pt will improve LE strength to 4/5 to improve standing and walking tolerance Baseline:  Goal status: INITIAL    LONG TERM GOALS: Target date: 01/17/2024    Pt will be independent with advanced HEP Baseline:  Goal status: INITIAL  2.  Pt will improve average PSFS to >= 6.5 to demo improved functional mobility Baseline: 3.5 Goal status: INITIAL  3.  Pt will tolerate bending to pick up items off of the floor with pain <= 1/10 Baseline: 6/10 Goal status: INITIAL  4.  Pt will improve 30 second chair rise test to >= 6 to demo improved LE strength and endurance Baseline:  Goal status: INITIAL   PLAN:  PT FREQUENCY: 2x/week  PT DURATION: 8 weeks  PLANNED INTERVENTIONS: 97164- PT Re-evaluation, 97110-Therapeutic exercises, 97530- Therapeutic activity, 97112- Neuromuscular re-education, 97535- Self Care, [id number]- Manual therapy, [id number]- Gait training, [phone]- Aquatic Therapy, [phone]- Electrical stimulation (unattended), [phone] (1-2 muscles), [id number] (3+ muscles)- Dry Needling, Patient/Family education, Balance training, Stair training, Taping, Cryotherapy, and Moist heat.  PLAN FOR NEXT SESSION: Assess response to HEP, manual as indicated, core strength   Slade Pierpoint, PT 11/22/2023, 2:57 PM

## 2023-11-28 ENCOUNTER — Encounter: Payer: Self-pay | Admitting: Physical Therapy

## 2023-11-28 ENCOUNTER — Ambulatory Visit: Admitting: Physical Therapy

## 2023-11-28 DIAGNOSIS — M6281 Muscle weakness (generalized): Secondary | ICD-10-CM | POA: Diagnosis not present

## 2023-11-28 DIAGNOSIS — M5459 Other low back pain: Secondary | ICD-10-CM

## 2023-11-28 NOTE — Therapy (Signed)
 OUTPATIENT PHYSICAL THERAPY THORACOLUMBAR TREATMENT   Patient Name: Michelle [name] MRN: [id number] DOB:12-22-1932, 89 y.o., female Today's Date: 11/28/2023  END OF SESSION:  PT End of Session - 11/28/23 1611     Visit Number 2    Number of Visits 16    Date for Recertification  01/17/24    Authorization Type [employer] Medicare    Authorization - Visit Number 2    Progress Note Due on Visit 10    PT Start Time 1530    PT Stop Time 1611    PT Time Calculation (min) 41 min    Activity Tolerance Patient tolerated treatment well    Behavior During Therapy [facility] for tasks assessed/performed           Past Medical History:  Diagnosis Date   Abdominal aortic aneurysm (AAA) 3.0 cm to 5.0 cm in diameter in female 11/18/2016   Due for repeat in 12/2019.   Abnormal hemoglobin 08/25/2022   Allergic rhinitis 05/26/2020   Aneurysm of infrarenal abdominal aorta 11/19/2013   2.6 cm in 2006 on CT.   CT 10/15 show 3 cm lesion.   US   2018 - no change  CT 2020 - 2.9 cm, stable      Arthritis    hips, lower back   Basal cell carcinoma 2011   R back   Bell's palsy 1984   Bilateral cold feet 03/28/2019   Blood transfusion without reported diagnosis 07/2015   Bradycardia    Cataract    Cholecystitis 2004   Chronic constipation 08/14/2018   Clotting disorder    Complication of anesthesia    COPD (chronic obstructive pulmonary disease)    Essential hypertension, benign 09/26/2007   Genetic testing 09/16/2021   Negative hereditary cancer genetic testing: no pathogenic variants detected in CancerNext-Expanded +RNAinsight Panel.  Report date is September 06, 2021.      The CancerNext-Expanded gene panel offered by [facility] and includes sequencing, rearrangement, and RNA analysis for the following 77 genes: AIP, ALK, APC, ATM, AXIN2, BAP1, BARD1, BLM, BMPR1A, BRCA1, BRCA2, BRIP1, CDC73, CDH1, CDK4, CDKN1B   GERD 11/03/2005   Hiatal hernia 2008   History of CVA (cerebrovascular accident) 09/24/2007    left cerebellar infarction, PICA territory   History of endometrial cancer 04/30/2013   uterine   History of GI bleed 07/04/2015   Hyperlipidemia 11/03/2005   LDL goal <100   Interstitial lung disease 03/28/2019   Iron deficiency anemia, unspecified    Low serum vitamin B12 08/26/2021   Lymphedema of both lower extremities 08/25/2022   Macular degeneration    Malignant neoplasm of upper-outer quadrant of right breast in female, estrogen receptor positive 02/21/2011   Mild vascular neurocognitive disorder 05/14/2021   OSA (obstructive sleep apnea) 04/28/2022   04/2022: HST, AHI 61.9/hr, no central apneas.  Set to 8 cm water pressure based on AutoPap results.     Osteoporosis 11/19/2014   DEXA 10/27/14.  Repeat in 2 yr.  High risk to start medication.    Polyp of colon 2008, 2014   Benign   PONV (postoperative nausea and vomiting)    Primary osteoarthritis of right knee 06/22/2015   Senile purpura 10/12/2022   Shingles 2008   Squamous cell skin cancer, face 03/22/2012   Actively seeing dermatology.   Tendonitis    rt hand   Venous stasis of both lower extremities 08/14/2018   Vocal cord polyp    Past Surgical History:  Procedure Laterality Date   ABDOMINAL HYSTERECTOMY  2005   APPENDECTOMY     BASAL CELL CARCINOMA EXCISION     biospy  10/28/2009   Shave Biospy skin Left hand(Acitinic Keratoses), Right Upper Arm (superficial Basal Cell), Right Upper Back(Superficial Basal Cell), Left Neck ( Solar Lentigo & Seborrheic Keratoses)   BREAST BIOPSY  02/11/2011   Right Breast Needle Core Biospy - Upper Outer Quadrant; ER/PR 100%, Her-2 Neu neg.; Ki-67 10%   BREAST LUMPECTOMY WITH RADIOACTIVE SEED LOCALIZATION Right 06/12/2018   Procedure: RIGHT BREAST LUMPECTOMY WITH RADIOACTIVE SEED LOCALIZATION;  Surgeon: Ebbie Cough, MD;  Location: [facility];  Service: General;  Laterality: Right;   BREAST MAMMOSITE  03/14/2011   Procedure: MAMMOSITE BREAST;  Surgeon:  Cough Ebbie, MD;  Location: [facility];  Service: General;  Laterality: Right;   BREAST SURGERY     right breast lumpectomy snbx   CHOLECYSTECTOMY  2004   COLONOSCOPY     COLONOSCOPY W/ POLYPECTOMY     2008, 2014 (benign)   COLONOSCOPY WITH PROPOFOL  N/A 07/09/2015   Procedure: COLONOSCOPY WITH PROPOFOL ;  Surgeon: Lesta JULIANNA Fitz, MD;  Location: [facility] END[facility]COPY;  Service: Endoscopy;  Laterality: N/A;   DILATION AND CURETTAGE OF UTERUS N/A 04/24/2013   Procedure: DILATATION AND CURETTAGE;  Surgeon: Burnard VEAR Pate, MD;  Location: [facility];  Service: Gynecology;  Laterality: N/A;   ESOPHAGOGASTRODUODENOSCOPY N/A 07/06/2015   Procedure: ESOPHAGOGASTRODUODENOSCOPY (EGD);  Surgeon: Jerrell Sol, MD;  Location[facility];  Service: Endoscopy;  Laterality: N/A;   HYSTEROSCOPY N/A 04/24/2013   Procedure: HYSTEROSCOPY;  Surgeon: Burnard VEAR Pate, MD;  Location: [facility];  Service: Gynecology;  Laterality: N/A;   ROBOTIC ASSISTED TOTAL HYSTERECTOMY WITH BILATERAL SALPINGO OOPHERECTOMY Bilateral 06/11/2013   Procedure: ROBOTIC ASSISTED TOTAL HYSTERECTOMY WITH BILATERAL SALPINGO OOPHORECTOMY WITH POSSIBLE STAGING, EPISIOTOMY;  Surgeon: Sari Bachelor, MD;  Location: [facility];  Service: Gynecology;  Laterality: Bilateral;   SQUAMOUS CELL CARCINOMA EXCISION     TEAR DUCT PROBING     TONSILLECTOMY     vocal cord poylps  1969   excision   Patient Active Problem List   Diagnosis Date Noted   Urge incontinence 08/08/2023   Senile purpura 10/12/2022   Abnormal hemoglobin 08/25/2022   Lymphedema of both lower extremities 08/25/2022   OSA (obstructive sleep apnea) 04/28/2022   Genetic testing 09/16/2021   Low serum vitamin B12 08/26/2021   Mild vascular neurocognitive disorder 05/14/2021   Allergic rhinitis 05/26/2020   Chronic interstitial lung disease (HCC) 03/28/2019   Bilateral cold feet 03/28/2019   Chronic constipation 08/14/2018   Venous stasis of both lower extremities  08/14/2018   Abdominal aortic aneurysm (AAA) 3.0 cm to 5.0 cm in diameter in female 11/18/2016   Iron deficiency anemia, unspecified 07/06/2015   Primary osteoarthritis of right knee 06/22/2015   Osteoporosis 11/19/2014   Aneurysm of infrarenal abdominal aorta 11/19/2013   History of endometrial cancer 04/30/2013   Macular degeneration 09/13/2011   Malignant neoplasm of upper-outer quadrant of right breast in female, estrogen receptor positive 02/21/2011   Essential hypertension, benign 09/26/2007   History of CVA (cerebrovascular accident) 09/24/2007   Hyperlipidemia LDL goal <100 11/03/2005   GERD 11/03/2005    PCP: Alvan  REFERRING PROVIDER: Alvan  REFERRING DIAG: acute Rt sided low back pain without sciatica  Rationale for Evaluation and Treatment: Rehabilitation  THERAPY DIAG:  Muscle weakness (generalized)  Other low back pain  ONSET DATE: 11/06/23  SUBJECTIVE:  SUBJECTIVE STATEMENT: Pt states she is sore today due to being on her feet all day packing for a trip. Pt states she was sore after performing pelvic tilt exercises.  PERTINENT HISTORY:  OA, osteoporosis Pt states that at the beginning of the month she was lifting suitcases during traveling and she hurt her lower back. She used biofreeze which helped a little bit but pain continues to persist. Pain increases with bending and with sit <> stand and with prolonged walking (shopping). Pain decreases with tylenol , use of heating pad and seated rest. She initially felt pain near her tailbone but now pain is in lower Rt back.  PAIN:  Are you having pain? Yes: NPRS scale: 3/10 currently, 6/10 at worst  Pain location: Rt lower back Pain description: ache, sore Aggravating factors: bending, sit <> stand Relieving factors: meds,  heat  PRECAUTIONS: Other: osteoporosis  RED FLAGS: None   WEIGHT BEARING RESTRICTIONS: No  FALLS:  Has patient fallen in last 6 months? No  LIVING ENVIRONMENT: Lives with: lives with their family Lives in: House/apartment Has following equipment at home: None  OCCUPATION: retired - lives with daughter  PLOF: Independent and Independent with basic ADLs  PATIENT GOALS: get rid of this pain  NEXT MD VISIT: PRN  OBJECTIVE:  Note: Objective measures were completed at Evaluation unless otherwise noted.  DIAGNOSTIC FINDINGS:  No recent imaging on file  PATIENT SURVEYS:  PSFS: THE PATIENT SPECIFIC FUNCTIONAL SCALE  Place score of 0-10 (0 = unable to perform activity and 10 = able to perform activity at the same level as before injury or problem)  Activity Date: 11/22/23    Sit to stand 4    2.pick up item from floor 3    3.     4.      Average Score 3.5      Total Score = Sum of activity scores/number of activities  Minimally Detectable Change: 3 points (for single activity); 2 points (for average score)  [facility] (nd). The Patient Specific Functional Scale . Retrieved from [email or web address]   COGNITION: Overall cognitive status: Within functional limits for tasks assessed       POSTURE: rounded shoulders, forward head, and flexed trunk   PALPATION: TTP Rt QL, Rt lumbar paraspinals, Rt glutes  LUMBAR ROM:   AROM eval  Flexion 90% pain end range  Extension 50%  Right lateral flexion WFL  Left lateral flexion WFL  Right rotation 70%  Left rotation 70%   (Blank rows = not tested)  LOWER EXTREMITY MMT:    MMT Right eval Left eval  Hip flexion 4- pain 4-  Hip extension 3+ 3+  Hip abduction 3+ 3+  Hip adduction    Hip internal rotation    Hip external rotation    Knee flexion    Knee extension    Ankle dorsiflexion    Ankle plantarflexion    Ankle inversion    Ankle  eversion     (Blank rows = not tested)   FUNCTIONAL TESTS:  30 seconds chair stand test = 4  GAIT: Distance walked: 100' Assistive device utilized: None Level of assistance: Complete Independence Comments: decreased cadence, forward flexed trunk  [facility] Adult PT Treatment:  DATE: 11/28/23 Therapeutic Exercise: Seated figure 4 stretch 2 x 30 sec bilat Seated QL stretch - forward flexion/lateral flexion  Neuromuscular re-ed: Ab isometric pressing into physioball Oblique isometric Lt side only with physioball Bow and arrow green TB x 6 bilat Resisted walking green TB x 6 Therapeutic Activity: Supine Adductor squeze 10 x 3 sec hold Hooklying clam red TB x 15 LTR x 10 Thoracic rotation at wall x 5 bilat J curl in tolerable range \  TREATMENT DATE: 11/22/23 See HEP Pt educated on PT POC and goals, HEP, rationale for treatment, relevant anatomy                                                                                                                                 PATIENT EDUCATION:  Education details: PT POC and goals, HEP Person educated: Patient Education method: Explanation, Demonstration, and Handouts Education comprehension: verbalized understanding and returned demonstration  HOME EXERCISE PROGRAM: Access Code: [email or web address] URL: [email or web address] Date: 11/28/2023 Prepared by: Darice Conine  Exercises - Standing Quadratus Lumborum Stretch with Doorway  - 2 x daily - 7 x weekly - 1 sets - 3 reps - 10-15 seconds hold - Seated Figure 4 Piriformis Stretch  - 2 x daily - 7 x weekly - 1 sets - 3 reps - 20-30 seconds hold - Seated Thoracic Flexion and Rotation on Swiss Ball  - 1 x daily - 7 x weekly - 1 sets - 3 reps - 20-30 seconds hold  ASSESSMENT:  CLINICAL IMPRESSION: Pt with good response to seated and standing trunk rotation stretches. Added seated and standing core strengthening with good  response. Pt reports she feels better at end of session     GOALS: Goals reviewed with patient? Yes  SHORT TERM GOALS: Target date: 12/20/2023    Pt will be independent in initial HEP Baseline: Goal status: INITIAL  2.  Pt will improve LE strength to 4/5 to improve standing and walking tolerance Baseline:  Goal status: INITIAL    LONG TERM GOALS: Target date: 01/17/2024    Pt will be independent with advanced HEP Baseline:  Goal status: INITIAL  2.  Pt will improve average PSFS to >= 6.5 to demo improved functional mobility Baseline: 3.5 Goal status: INITIAL  3.  Pt will tolerate bending to pick up items off of the floor with pain <= 1/10 Baseline: 6/10 Goal status: INITIAL  4.  Pt will improve 30 second chair rise test to >= 6 to demo improved LE strength and endurance Baseline:  Goal status: INITIAL   PLAN:  PT FREQUENCY: 2x/week  PT DURATION: 8 weeks  PLANNED INTERVENTIONS: 97164- PT Re-evaluation, 97110-Therapeutic exercises, 97530- Therapeutic activity, 97112- Neuromuscular re-education, 97535- Self Care, [id number]- Manual therapy, [id number]- Gait training, [phone]- Aquatic Therapy, [phone]- Electrical stimulation (unattended), [phone] (1-2 muscles), [id number] (3+ muscles)- Dry Needling, Patient/Family education, Balance training, Stair training, Taping, Cryotherapy, and Moist heat.  PLAN FOR NEXT SESSION: update HEP  PRN, manual as indicated, core strength   Shaniece Bussa, PT 11/28/2023, 4:12 PM

## 2023-11-29 ENCOUNTER — Ambulatory Visit: Admitting: Physical Therapy

## 2023-11-29 ENCOUNTER — Encounter: Payer: Self-pay | Admitting: Physical Therapy

## 2023-11-29 DIAGNOSIS — M5459 Other low back pain: Secondary | ICD-10-CM

## 2023-11-29 DIAGNOSIS — M6281 Muscle weakness (generalized): Secondary | ICD-10-CM

## 2023-11-29 NOTE — Therapy (Signed)
 OUTPATIENT PHYSICAL THERAPY THORACOLUMBAR TREATMENT   Patient Name: Michelle [name] MRN: [id number] DOB:1933-01-18, 89 y.o., female Today's Date: 11/29/2023  END OF SESSION:  PT End of Session - 11/29/23 1402     Visit Number 3    Number of Visits 16    Date for Recertification  01/17/24    Authorization Type [employer] Medicare    Authorization - Visit Number 2    Progress Note Due on Visit 10    PT Start Time 1403    PT Stop Time 1444    PT Time Calculation (min) 41 min    Activity Tolerance Patient tolerated treatment well    Behavior During Therapy [facility] for tasks assessed/performed           Past Medical History:  Diagnosis Date   Abdominal aortic aneurysm (AAA) 3.0 cm to 5.0 cm in diameter in female 11/18/2016   Due for repeat in 12/2019.   Abnormal hemoglobin 08/25/2022   Allergic rhinitis 05/26/2020   Aneurysm of infrarenal abdominal aorta 11/19/2013   2.6 cm in 2006 on CT.   CT 10/15 show 3 cm lesion.   US   2018 - no change  CT 2020 - 2.9 cm, stable      Arthritis    hips, lower back   Basal cell carcinoma 2011   R back   Bell's palsy 1984   Bilateral cold feet 03/28/2019   Blood transfusion without reported diagnosis 07/2015   Bradycardia    Cataract    Cholecystitis 2004   Chronic constipation 08/14/2018   Clotting disorder    Complication of anesthesia    COPD (chronic obstructive pulmonary disease)    Essential hypertension, benign 09/26/2007   Genetic testing 09/16/2021   Negative hereditary cancer genetic testing: no pathogenic variants detected in CancerNext-Expanded +RNAinsight Panel.  Report date is September 06, 2021.      The CancerNext-Expanded gene panel offered by [facility] and includes sequencing, rearrangement, and RNA analysis for the following 77 genes: AIP, ALK, APC, ATM, AXIN2, BAP1, BARD1, BLM, BMPR1A, BRCA1, BRCA2, BRIP1, CDC73, CDH1, CDK4, CDKN1B   GERD 11/03/2005   Hiatal hernia 2008   History of CVA (cerebrovascular accident) 09/24/2007    left cerebellar infarction, PICA territory   History of endometrial cancer 04/30/2013   uterine   History of GI bleed 07/04/2015   Hyperlipidemia 11/03/2005   LDL goal <100   Interstitial lung disease 03/28/2019   Iron deficiency anemia, unspecified    Low serum vitamin B12 08/26/2021   Lymphedema of both lower extremities 08/25/2022   Macular degeneration    Malignant neoplasm of upper-outer quadrant of right breast in female, estrogen receptor positive 02/21/2011   Mild vascular neurocognitive disorder 05/14/2021   OSA (obstructive sleep apnea) 04/28/2022   04/2022: HST, AHI 61.9/hr, no central apneas.  Set to 8 cm water pressure based on AutoPap results.     Osteoporosis 11/19/2014   DEXA 10/27/14.  Repeat in 2 yr.  High risk to start medication.    Polyp of colon 2008, 2014   Benign   PONV (postoperative nausea and vomiting)    Primary osteoarthritis of right knee 06/22/2015   Senile purpura 10/12/2022   Shingles 2008   Squamous cell skin cancer, face 03/22/2012   Actively seeing dermatology.   Tendonitis    rt hand   Venous stasis of both lower extremities 08/14/2018   Vocal cord polyp    Past Surgical History:  Procedure Laterality Date   ABDOMINAL HYSTERECTOMY  2005   APPENDECTOMY     BASAL CELL CARCINOMA EXCISION     biospy  10/28/2009   Shave Biospy skin Left hand(Acitinic Keratoses), Right Upper Arm (superficial Basal Cell), Right Upper Back(Superficial Basal Cell), Left Neck ( Solar Lentigo & Seborrheic Keratoses)   BREAST BIOPSY  02/11/2011   Right Breast Needle Core Biospy - Upper Outer Quadrant; ER/PR 100%, Her-2 Neu neg.; Ki-67 10%   BREAST LUMPECTOMY WITH RADIOACTIVE SEED LOCALIZATION Right 06/12/2018   Procedure: RIGHT BREAST LUMPECTOMY WITH RADIOACTIVE SEED LOCALIZATION;  Surgeon: Ebbie Cough, MD;  Location: [facility];  Service: General;  Laterality: Right;   BREAST MAMMOSITE  03/14/2011   Procedure: MAMMOSITE BREAST;  Surgeon:  Cough Ebbie, MD;  Location: [facility];  Service: General;  Laterality: Right;   BREAST SURGERY     right breast lumpectomy snbx   CHOLECYSTECTOMY  2004   COLONOSCOPY     COLONOSCOPY W/ POLYPECTOMY     2008, 2014 (benign)   COLONOSCOPY WITH PROPOFOL  N/A 07/09/2015   Procedure: COLONOSCOPY WITH PROPOFOL ;  Surgeon: Lesta JULIANNA Fitz, MD;  Location: [facility];  Service: Endoscopy;  Laterality: N/A;   DILATION AND CURETTAGE OF UTERUS N/A 04/24/2013   Procedure: DILATATION AND CURETTAGE;  Surgeon: Burnard VEAR Pate, MD;  Location: [facility];  Service: Gynecology;  Laterality: N/A;   ESOPHAGOGASTRODUODENOSCOPY N/A 07/06/2015   Procedure: ESOPHAGOGASTRODUODENOSCOPY (EGD);  Surgeon: Jerrell Sol, MD;  Location: [facility];  Service: Endoscopy;  Laterality: N/A;   HYSTEROSCOPY N/A 04/24/2013   Procedure: HYSTEROSCOPY;  Surgeon: Burnard VEAR Pate, MD;  Location: [facility];  Service: Gynecology;  Laterality: N/A;   ROBOTIC ASSISTED TOTAL HYSTERECTOMY WITH BILATERAL SALPINGO OOPHERECTOMY Bilateral 06/11/2013   Procedure: ROBOTIC ASSISTED TOTAL HYSTERECTOMY WITH BILATERAL SALPINGO OOPHORECTOMY WITH POSSIBLE STAGING, EPISIOTOMY;  Surgeon: Sari Bachelor, MD;  Location: [facility];  Service: Gynecology;  Laterality: Bilateral;   SQUAMOUS CELL CARCINOMA EXCISION     TEAR DUCT PROBING     TONSILLECTOMY     vocal cord poylps  1969   excision   Patient Active Problem List   Diagnosis Date Noted   Urge incontinence 08/08/2023   Senile purpura 10/12/2022   Abnormal hemoglobin 08/25/2022   Lymphedema of both lower extremities 08/25/2022   OSA (obstructive sleep apnea) 04/28/2022   Genetic testing 09/16/2021   Low serum vitamin B12 08/26/2021   Mild vascular neurocognitive disorder 05/14/2021   Allergic rhinitis 05/26/2020   Chronic interstitial lung disease (HCC) 03/28/2019   Bilateral cold feet 03/28/2019   Chronic constipation 08/14/2018   Venous stasis of both lower extremities  08/14/2018   Abdominal aortic aneurysm (AAA) 3.0 cm to 5.0 cm in diameter in female 11/18/2016   Iron deficiency anemia, unspecified 07/06/2015   Primary osteoarthritis of right knee 06/22/2015   Osteoporosis 11/19/2014   Aneurysm of infrarenal abdominal aorta 11/19/2013   History of endometrial cancer 04/30/2013   Macular degeneration 09/13/2011   Malignant neoplasm of upper-outer quadrant of right breast in female, estrogen receptor positive 02/21/2011   Essential hypertension, benign 09/26/2007   History of CVA (cerebrovascular accident) 09/24/2007   Hyperlipidemia LDL goal <100 11/03/2005   GERD 11/03/2005    PCP: Alvan  REFERRING PROVIDER: Alvan  REFERRING DIAG: acute Rt sided low back pain without sciatica  Rationale for Evaluation and Treatment: Rehabilitation  THERAPY DIAG:  Muscle weakness (generalized)  Other low back pain  ONSET DATE: 11/06/23  SUBJECTIVE:  SUBJECTIVE STATEMENT: Pt reports feeling good after last session. She is doing lots of packing and washing clothes for an upcoming trip. He used a heating pad before session today and feels better.   PERTINENT HISTORY:  OA, osteoporosis Pt states that at the beginning of the month she was lifting suitcases during traveling and she hurt her lower back. She used biofreeze which helped a little bit but pain continues to persist. Pain increases with bending and with sit <> stand and with prolonged walking (shopping). Pain decreases with tylenol , use of heating pad and seated rest. She initially felt pain near her tailbone but now pain is in lower Rt back.  PAIN:  Are you having pain? Yes: NPRS scale: 1/10 currently, 6/10 at worst  Pain location: Rt lower back Pain description: ache, sore Aggravating factors: bending, sit <>  stand Relieving factors: meds, heat  PRECAUTIONS: Other: osteoporosis  RED FLAGS: None   WEIGHT BEARING RESTRICTIONS: No  FALLS:  Has patient fallen in last 6 months? No  LIVING ENVIRONMENT: Lives with: lives with their family Lives in: House/apartment Has following equipment at home: None  OCCUPATION: retired - lives with daughter  PLOF: Independent and Independent with basic ADLs  PATIENT GOALS: get rid of this pain  NEXT MD VISIT: PRN  OBJECTIVE:  Note: Objective measures were completed at Evaluation unless otherwise noted.  DIAGNOSTIC FINDINGS:  No recent imaging on file  PATIENT SURVEYS:  PSFS: THE PATIENT SPECIFIC FUNCTIONAL SCALE  Place score of 0-10 (0 = unable to perform activity and 10 = able to perform activity at the same level as before injury or problem)  Activity Date: 11/22/23    Sit to stand 4    2.pick up item from floor 3    3.     4.      Average Score 3.5      Total Score = Sum of activity scores/number of activities  Minimally Detectable Change: 3 points (for single activity); 2 points (for average score)  [facility] (nd). The Patient Specific Functional Scale . Retrieved from [email or web address]   COGNITION: Overall cognitive status: Within functional limits for tasks assessed       POSTURE: rounded shoulders, forward head, and flexed trunk   PALPATION: TTP Rt QL, Rt lumbar paraspinals, Rt glutes  LUMBAR ROM:   AROM eval  Flexion 90% pain end range  Extension 50%  Right lateral flexion WFL  Left lateral flexion WFL  Right rotation 70%  Left rotation 70%   (Blank rows = not tested)  LOWER EXTREMITY MMT:    MMT Right eval Left eval  Hip flexion 4- pain 4-  Hip extension 3+ 3+  Hip abduction 3+ 3+  Hip adduction    Hip internal rotation    Hip external rotation    Knee flexion    Knee extension    Ankle dorsiflexion    Ankle plantarflexion     Ankle inversion    Ankle eversion     (Blank rows = not tested)   FUNCTIONAL TESTS:  30 seconds chair stand test = 4  GAIT: Distance walked: 100' Assistive device utilized: None Level of assistance: Complete Independence Comments: decreased cadence, forward flexed trunk [facility] Adult PT Treatment:  DATE: 11/29/23 Therapeutic Exercise: Seated figure 4 stretch 2 x 30 sec bilat Seated QL stretch - forward flexion/lateral flexion Adductor squeeze in seated 2x10 with ball between knees -verbal cues for breathing techniques Manual Therapy: STM to right QL, lumbar paraspinals- tissue restriction  Neuromuscular re-ed: LAQ x10 LLE, x8 RLE AROM shoulder retraction 2x8  Therapeutic Activity: Standing forward kick out x8 each leg  Standing leg extensions 2x8 each leg - verbal cues for upright posture  Seated hip ER with green band 2x8   [facility] Adult PT Treatment:                                                DATE: 11/28/23 Therapeutic Exercise: Seated figure 4 stretch 2 x 30 sec bilat Seated QL stretch - forward flexion/lateral flexion  Neuromuscular re-ed: Ab isometric pressing into physioball Oblique isometric Lt side only with physioball Bow and arrow green TB x 6 bilat Resisted walking green TB x 6 Therapeutic Activity: Supine Adductor squeze 10 x 3 sec hold Hooklying clam red TB x 15 LTR x 10 Thoracic rotation at wall x 5 bilat J curl in tolerable range \  TREATMENT DATE: 11/22/23 See HEP Pt educated on PT POC and goals, HEP, rationale for treatment, relevant anatomy                                                                                                                                 PATIENT EDUCATION:  Education details: PT POC and goals, HEP Person educated: Patient Education method: Explanation, Demonstration, Tactile cues, and Verbal cues Education comprehension: verbalized understanding and returned  demonstration  HOME EXERCISE PROGRAM: Access Code: [email or web address] URL: [email or web address] Date: 11/29/2023 Prepared by: Darice Conine Exercises - Seated Figure 4 Piriformis Stretch  - 2 x daily - 7 x weekly - 1 sets - 3 reps - 20-30 seconds hold - Seated Thoracic Flexion and Rotation on Swiss Ball  - 1 x daily - 7 x weekly - 1 sets - 3 reps - 20-30 seconds hold - Seated Hip Adduction Isometrics with Ball  - 1 x daily - 7 x weekly - 1 sets - 10 reps - 3 seconds hold - Seated Hip Abduction with Resistance  - 1 x daily - 7 x weekly - 2 sets - 8 reps - Standing Hip Extension with Counter Support  - 1 x daily - 7 x weekly - 2 sets - 8 reps   ASSESSMENT:  CLINICAL IMPRESSION: Pt trialed new LE strengthening exercises, progressing from supine position to seated, with added resistance. Required several verbal cues to maintain upright posture during exercises, cued to engage core while moving LE. Today really focused on proper breathing pattern during exercises, pt required consistent verbal cues when to exhale. Pt noted mild back pain after performing exercises at  table, thus discontinued exercises at table and moved onto standing exercises; pt reports no pain after change.Manual therapy pt presents with significant tissue restriction in the right QL muscle and paraspinals, responding with jerking motions secondary to pain from mechanical stress applied. However states she felt better overall by end of soft tissue mobilization.      GOALS: Goals reviewed with patient? Yes  SHORT TERM GOALS: Target date: 12/20/2023    Pt will be independent in initial HEP Baseline: Goal status: INITIAL  2.  Pt will improve LE strength to 4/5 to improve standing and walking tolerance Baseline:  Goal status: INITIAL    LONG TERM GOALS: Target date: 01/17/2024    Pt will be independent with advanced HEP Baseline:  Goal status: INITIAL  2.  Pt will improve average PSFS to >= 6.5 to demo improved  functional mobility Baseline: 3.5 Goal status: INITIAL  3.  Pt will tolerate bending to pick up items off of the floor with pain <= 1/10 Baseline: 6/10 Goal status: INITIAL  4.  Pt will improve 30 second chair rise test to >= 6 to demo improved LE strength and endurance Baseline:  Goal status: INITIAL   PLAN:  PT FREQUENCY: 2x/week  PT DURATION: 8 weeks  PLANNED INTERVENTIONS: 97164- PT Re-evaluation, 97110-Therapeutic exercises, 97530- Therapeutic activity, 97112- Neuromuscular re-education, 97535- Self Care, [id number]- Manual therapy, [id number]- Gait training, [phone]- Aquatic Therapy, [phone]- Electrical stimulation (unattended), [phone] (1-2 muscles), [id number] (3+ muscles)- Dry Needling, Patient/Family education, Balance training, Stair training, Taping, Cryotherapy, and Moist heat.  PLAN FOR NEXT SESSION: update HEP PRN, manual as indicated, core strength, LAQ, standing leg extension,    Lavanda Cleverly, Student-PT 11/29/2023, 2:47 PM

## 2023-12-05 ENCOUNTER — Encounter: Payer: Self-pay | Admitting: Physical Therapy

## 2023-12-05 ENCOUNTER — Ambulatory Visit: Attending: Family Medicine | Admitting: Physical Therapy

## 2023-12-05 DIAGNOSIS — M5459 Other low back pain: Secondary | ICD-10-CM | POA: Diagnosis present

## 2023-12-05 DIAGNOSIS — M6281 Muscle weakness (generalized): Secondary | ICD-10-CM | POA: Insufficient documentation

## 2023-12-05 NOTE — Therapy (Signed)
 OUTPATIENT PHYSICAL THERAPY THORACOLUMBAR TREATMENT   Patient Name: Michelle [name] MRN: [id number] DOB:Nov 28, 1932, 89 y.o., female Today's Date: 12/05/2023  END OF SESSION:  PT End of Session - 12/05/23 1405     Visit Number 4    Number of Visits 16    Date for Recertification  01/17/24    Authorization Type [employer] Medicare    Authorization - Visit Number 3    Progress Note Due on Visit 10    PT Start Time 1403    PT Stop Time 1446    PT Time Calculation (min) 43 min    Activity Tolerance Patient tolerated treatment well    Behavior During Therapy [facility] for tasks assessed/performed            Past Medical History:  Diagnosis Date   Abdominal aortic aneurysm (AAA) 3.0 cm to 5.0 cm in diameter in female 11/18/2016   Due for repeat in 12/2019.   Abnormal hemoglobin 08/25/2022   Allergic rhinitis 05/26/2020   Aneurysm of infrarenal abdominal aorta 11/19/2013   2.6 cm in 2006 on CT.   CT 10/15 show 3 cm lesion.   US   2018 - no change  CT 2020 - 2.9 cm, stable      Arthritis    hips, lower back   Basal cell carcinoma 2011   R back   Bell's palsy 1984   Bilateral cold feet 03/28/2019   Blood transfusion without reported diagnosis 07/2015   Bradycardia    Cataract    Cholecystitis 2004   Chronic constipation 08/14/2018   Clotting disorder    Complication of anesthesia    COPD (chronic obstructive pulmonary disease)    Essential hypertension, benign 09/26/2007   Genetic testing 09/16/2021   Negative hereditary cancer genetic testing: no pathogenic variants detected in CancerNext-Expanded +RNAinsight Panel.  Report date is September 06, 2021.      The CancerNext-Expanded gene panel offered by [facility] and includes sequencing, rearrangement, and RNA analysis for the following 77 genes: AIP, ALK, APC, ATM, AXIN2, BAP1, BARD1, BLM, BMPR1A, BRCA1, BRCA2, BRIP1, CDC73, CDH1, CDK4, CDKN1B   GERD 11/03/2005   Hiatal hernia 2008   History of CVA (cerebrovascular accident) 09/24/2007    left cerebellar infarction, PICA territory   History of endometrial cancer 04/30/2013   uterine   History of GI bleed 07/04/2015   Hyperlipidemia 11/03/2005   LDL goal <100   Interstitial lung disease 03/28/2019   Iron deficiency anemia, unspecified    Low serum vitamin B12 08/26/2021   Lymphedema of both lower extremities 08/25/2022   Macular degeneration    Malignant neoplasm of upper-outer quadrant of right breast in female, estrogen receptor positive 02/21/2011   Mild vascular neurocognitive disorder 05/14/2021   OSA (obstructive sleep apnea) 04/28/2022   04/2022: HST, AHI 61.9/hr, no central apneas.  Set to 8 cm water pressure based on AutoPap results.     Osteoporosis 11/19/2014   DEXA 10/27/14.  Repeat in 2 yr.  High risk to start medication.    Polyp of colon 2008, 2014   Benign   PONV (postoperative nausea and vomiting)    Primary osteoarthritis of right knee 06/22/2015   Senile purpura 10/12/2022   Shingles 2008   Squamous cell skin cancer, face 03/22/2012   Actively seeing dermatology.   Tendonitis    rt hand   Venous stasis of both lower extremities 08/14/2018   Vocal cord polyp    Past Surgical History:  Procedure Laterality Date   ABDOMINAL  HYSTERECTOMY  2005   APPENDECTOMY     BASAL CELL CARCINOMA EXCISION     biospy  10/28/2009   Shave Biospy skin Left hand(Acitinic Keratoses), Right Upper Arm (superficial Basal Cell), Right Upper Back(Superficial Basal Cell), Left Neck ( Solar Lentigo & Seborrheic Keratoses)   BREAST BIOPSY  02/11/2011   Right Breast Needle Core Biospy - Upper Outer Quadrant; ER/PR 100%, Her-2 Neu neg.; Ki-67 10%   BREAST LUMPECTOMY WITH RADIOACTIVE SEED LOCALIZATION Right 06/12/2018   Procedure: RIGHT BREAST LUMPECTOMY WITH RADIOACTIVE SEED LOCALIZATION;  Surgeon: Ebbie Cough, MD;  Location: [facility];  Service: General;  Laterality: Right;   BREAST MAMMOSITE  03/14/2011   Procedure: MAMMOSITE BREAST;  Surgeon:  Cough Ebbie, MD;  Location: [facility];  Service: General;  Laterality: Right;   BREAST SURGERY     right breast lumpectomy snbx   CHOLECYSTECTOMY  2004   COLONOSCOPY     COLONOSCOPY W/ POLYPECTOMY     2008, 2014 (benign)   COLONOSCOPY WITH PROPOFOL  N/A 07/09/2015   Procedure: COLONOSCOPY WITH PROPOFOL ;  Surgeon: Lesta JULIANNA Fitz, MD;  Location: [facility]COPY;  Service: Endoscopy;  Laterality: N/A;   DILATION AND CURETTAGE OF UTERUS N/A 04/24/2013   Procedure: DILATATION AND CURETTAGE;  Surgeon: Burnard VEAR Pate, MD;  Location: [facility];  Service: Gynecology;  Laterality: N/A;   ESOPHAGOGASTRODUODENOSCOPY N/A 07/06/2015   Procedure: ESOPHAGOGASTRODUODENOSCOPY (EGD);  Surgeon: Jerrell Sol, MD;  Location[facility];  Service: Endoscopy;  Laterality: N/A;   HYSTEROSCOPY N/A 04/24/2013   Procedure: HYSTEROSCOPY;  Surgeon: Burnard VEAR Pate, MD;  Location: [facility];  Service: Gynecology;  Laterality: N/A;   ROBOTIC ASSISTED TOTAL HYSTERECTOMY WITH BILATERAL SALPINGO OOPHERECTOMY Bilateral 06/11/2013   Procedure: ROBOTIC ASSISTED TOTAL HYSTERECTOMY WITH BILATERAL SALPINGO OOPHORECTOMY WITH POSSIBLE STAGING, EPISIOTOMY;  Surgeon: Sari Bachelor, MD;  Location: [facility];  Service: Gynecology;  Laterality: Bilateral;   SQUAMOUS CELL CARCINOMA EXCISION     TEAR DUCT PROBING     TONSILLECTOMY     vocal cord poylps  1969   excision   Patient Active Problem List   Diagnosis Date Noted   Urge incontinence 08/08/2023   Senile purpura 10/12/2022   Abnormal hemoglobin 08/25/2022   Lymphedema of both lower extremities 08/25/2022   OSA (obstructive sleep apnea) 04/28/2022   Genetic testing 09/16/2021   Low serum vitamin B12 08/26/2021   Mild vascular neurocognitive disorder 05/14/2021   Allergic rhinitis 05/26/2020   Chronic interstitial lung disease (HCC) 03/28/2019   Bilateral cold feet 03/28/2019   Chronic constipation 08/14/2018   Venous stasis of both lower extremities  08/14/2018   Abdominal aortic aneurysm (AAA) 3.0 cm to 5.0 cm in diameter in female 11/18/2016   Iron deficiency anemia, unspecified 07/06/2015   Primary osteoarthritis of right knee 06/22/2015   Osteoporosis 11/19/2014   Aneurysm of infrarenal abdominal aorta 11/19/2013   History of endometrial cancer 04/30/2013   Macular degeneration 09/13/2011   Malignant neoplasm of upper-outer quadrant of right breast in female, estrogen receptor positive 02/21/2011   Essential hypertension, benign 09/26/2007   History of CVA (cerebrovascular accident) 09/24/2007   Hyperlipidemia LDL goal <100 11/03/2005   GERD 11/03/2005    PCP: Alvan  REFERRING PROVIDER: Alvan  REFERRING DIAG: acute Rt sided low back pain without sciatica  Rationale for Evaluation and Treatment: Rehabilitation  THERAPY DIAG:  Muscle weakness (generalized)  Other low back pain  ONSET DATE: 11/06/23  SUBJECTIVE:  SUBJECTIVE STATEMENT: Pt reports complete pain relief after last session, the STM. However, when started to perform HEP, the pain returned.   PERTINENT HISTORY:  OA, osteoporosis Pt states that at the beginning of the month she was lifting suitcases during traveling and she hurt her lower back. She used biofreeze which helped a little bit but pain continues to persist. Pain increases with bending and with sit <> stand and with prolonged walking (shopping). Pain decreases with tylenol , use of heating pad and seated rest. She initially felt pain near her tailbone but now pain is in lower Rt back.  PAIN:  Are you having pain? Yes: NPRS scale: 1/10 currently sore , 6/10 at worst  Pain location: Rt lower back Pain description: ache, sore Aggravating factors: bending, sit <> stand Relieving factors: meds, heat  PRECAUTIONS:  Other: osteoporosis  RED FLAGS: None   WEIGHT BEARING RESTRICTIONS: No  FALLS:  Has patient fallen in last 6 months? No  LIVING ENVIRONMENT: Lives with: lives with their family Lives in: House/apartment Has following equipment at home: None  OCCUPATION: retired - lives with daughter  PLOF: Independent and Independent with basic ADLs  PATIENT GOALS: get rid of this pain  NEXT MD VISIT: PRN  OBJECTIVE:  Note: Objective measures were completed at Evaluation unless otherwise noted.  DIAGNOSTIC FINDINGS:  No recent imaging on file  PATIENT SURVEYS:  PSFS: THE PATIENT SPECIFIC FUNCTIONAL SCALE  Place score of 0-10 (0 = unable to perform activity and 10 = able to perform activity at the same level as before injury or problem)  Activity Date: 11/22/23    Sit to stand 4    2.pick up item from floor 3    3.     4.      Average Score 3.5      Total Score = Sum of activity scores/number of activities  Minimally Detectable Change: 3 points (for single activity); 2 points (for average score)  [facility] (nd). The Patient Specific Functional Scale . Retrieved from [email or web address]   COGNITION: Overall cognitive status: Within functional limits for tasks assessed       POSTURE: rounded shoulders, forward head, and flexed trunk   PALPATION: TTP Rt QL, Rt lumbar paraspinals, Rt glutes  LUMBAR ROM:   AROM eval  Flexion 90% pain end range  Extension 50%  Right lateral flexion WFL  Left lateral flexion WFL  Right rotation 70%  Left rotation 70%   (Blank rows = not tested)  LOWER EXTREMITY MMT:    MMT Right eval Left eval  Hip flexion 4- pain 4-  Hip extension 3+ 3+  Hip abduction 3+ 3+  Hip adduction    Hip internal rotation    Hip external rotation    Knee flexion    Knee extension    Ankle dorsiflexion    Ankle plantarflexion    Ankle inversion    Ankle eversion     (Blank rows =  not tested)   FUNCTIONAL TESTS:  30 seconds chair stand test = 4  GAIT: Distance walked: 100' Assistive device utilized: None Level of assistance: Complete Independence Comments: decreased cadence, forward flexed trunk [facility] Adult PT Treatment:                                                DATE: 12/05/23 Therapeutic Exercise: Adductor squeeze  between legs 2x10  Forward fold for low back stretch  Manual Therapy: STM to right QL, paraspinals- increased muscle tightness Neuromuscular re-ed: AROM shoulder retraction x10  Seated dead bug x5  Therapeutic Activity: Standing forward kick out x10 each leg  Standing leg extensions x10  each leg - verbal cues for upright posture  Standing abduction x10 each leg - verbacal cues for neutral hips  Seated ball press for core activation x10     [facility] Adult PT Treatment:                                                DATE: 11/29/23 Therapeutic Exercise: Seated figure 4 stretch 2 x 30 sec bilat Seated QL stretch - forward flexion/lateral flexion Adductor squeeze in seated 2x10 with ball between knees -verbal cues for breathing techniques Manual Therapy: STM to right QL, lumbar paraspinals- tissue restriction  Neuromuscular re-ed: LAQ x10 LLE, x8 RLE AROM shoulder retraction 2x8  Therapeutic Activity: Standing forward kick out x8 each leg  Standing leg extensions 2x8 each leg - verbal cues for upright posture  Seated hip ER with green band 2x8   [facility] Adult PT Treatment:                                                DATE: 11/28/23 Therapeutic Exercise: Seated figure 4 stretch 2 x 30 sec bilat Seated QL stretch - forward flexion/lateral flexion  Neuromuscular re-ed: Ab isometric pressing into physioball Oblique isometric Lt side only with physioball Bow and arrow green TB x 6 bilat Resisted walking green TB x 6 Therapeutic Activity: Supine Adductor squeze 10 x 3 sec hold Hooklying clam red TB x 15 LTR x 10 Thoracic rotation at  wall x 5 bilat J curl in tolerable range     PATIENT EDUCATION:  Education details: PT POC and goals, HEP Person educated: Patient Education method: Explanation, Demonstration, Tactile cues, and Verbal cues Education comprehension: verbalized understanding and returned demonstration  HOME EXERCISE PROGRAM: Access Code: [email or web address] URL: [email or web address] Date: 11/29/2023 Prepared by: Darice Conine Exercises - Seated Figure 4 Piriformis Stretch  - 2 x daily - 7 x weekly - 1 sets - 3 reps - 20-30 seconds hold - Seated Thoracic Flexion and Rotation on Swiss Ball  - 1 x daily - 7 x weekly - 1 sets - 3 reps - 20-30 seconds hold - Seated Hip Adduction Isometrics with Ball  - 1 x daily - 7 x weekly - 1 sets - 10 reps - 3 seconds hold - Seated Hip Abduction with Resistance  - 1 x daily - 7 x weekly - 2 sets - 8 reps - Standing Hip Extension with Counter Support  - 1 x daily - 7 x weekly - 2 sets - 8 reps   ASSESSMENT:  CLINICAL IMPRESSION: Pt trialed new LE strengthening exercises, progressing from supine position to seated, with added resistance. Required several verbal cues to maintain upright posture during exercises, cued to engage core while moving LE. Today really focused on proper breathing pattern during exercises, pt required consistent verbal cues when to exhale. Pt noted mild back pain after performing exercises at table, thus discontinued exercises at table and moved onto standing exercises; pt reports  no pain after change.Manual therapy pt presents with significant tissue restriction in the right QL muscle and paraspinals, responding with jerking motions secondary to pain from mechanical stress applied. However states she felt better overall by end of soft tissue mobilization.   Focused on progressing LE exercises in standing. Pt challenged with coordination during seated dead bug, requiring SPT demonstration throughout as a guide. Pt reported right sided low back pain during  end of exercises with standing abduction and seated dead bug, thus discontinued and finished session with STM to right QL. Pt stated last sessions STM alleviated her right low back pain lasting days afterwards, returning upon performing HEP. Will progress as able.     GOALS: Goals reviewed with patient? Yes  SHORT TERM GOALS: Target date: 12/20/2023    Pt will be independent in initial HEP Baseline: Goal status: Progressing   2.  Pt will improve LE strength to 4/5 to improve standing and walking tolerance Baseline:  Goal status: INITIAL    LONG TERM GOALS: Target date: 01/17/2024    Pt will be independent with advanced HEP Baseline:  Goal status: INITIAL  2.  Pt will improve average PSFS to >= 6.5 to demo improved functional mobility Baseline: 3.5 Goal status: INITIAL  3.  Pt will tolerate bending to pick up items off of the floor with pain <= 1/10 Baseline: 6/10 Goal status: INITIAL  4.  Pt will improve 30 second chair rise test to >= 6 to demo improved LE strength and endurance Baseline:  Goal status: INITIAL   PLAN:  PT FREQUENCY: 2x/week  PT DURATION: 8 weeks  PLANNED INTERVENTIONS: 97164- PT Re-evaluation, 97110-Therapeutic exercises, 97530- Therapeutic activity, 97112- Neuromuscular re-education, 97535- Self Care, [id number]- Manual therapy, [id number]- Gait training, [phone]- Aquatic Therapy, [phone]- Electrical stimulation (unattended), [phone] (1-2 muscles), 20561 (3+ muscles)- Dry Needling, Patient/Family education, Balance training, Stair training, Taping, Cryotherapy, and Moist heat.  PLAN FOR NEXT SESSION: update HEP PRN, manual as indicated, core strength, LAQ, standing leg extension,    Lavanda Cleverly, Student-PT 12/05/2023, 2:48 PM

## 2023-12-06 ENCOUNTER — Ambulatory Visit: Admitting: Physical Therapy

## 2023-12-06 ENCOUNTER — Encounter: Payer: Self-pay | Admitting: Physical Therapy

## 2023-12-06 DIAGNOSIS — M6281 Muscle weakness (generalized): Secondary | ICD-10-CM

## 2023-12-06 DIAGNOSIS — M5459 Other low back pain: Secondary | ICD-10-CM

## 2023-12-06 NOTE — Therapy (Addendum)
 OUTPATIENT PHYSICAL THERAPY THORACOLUMBAR TREATMENT   Patient Name: Michelle [name] MRN: [id number] DOB:October 28, 1932, 89 y.o., female Today's Date: 12/06/2023  END OF SESSION:  PT End of Session - 12/06/23 1554     Visit Number --    Number of Visits --    Date for Recertification  --    Authorization Type [employer] Medicare    Progress Note Due on Visit 10    PT Start Time --    PT Stop Time --    PT Time Calculation (min) --    Activity Tolerance Patient tolerated treatment well    Behavior During Therapy [facility] for tasks assessed/performed             Past Medical History:  Diagnosis Date   Abdominal aortic aneurysm (AAA) 3.0 cm to 5.0 cm in diameter in female 11/18/2016   Due for repeat in 12/2019.   Abnormal hemoglobin 08/25/2022   Allergic rhinitis 05/26/2020   Aneurysm of infrarenal abdominal aorta 11/19/2013   2.6 cm in 2006 on CT.   CT 10/15 show 3 cm lesion.   US   2018 - no change  CT 2020 - 2.9 cm, stable      Arthritis    hips, lower back   Basal cell carcinoma 2011   R back   Bell's palsy 1984   Bilateral cold feet 03/28/2019   Blood transfusion without reported diagnosis 07/2015   Bradycardia    Cataract    Cholecystitis 2004   Chronic constipation 08/14/2018   Clotting disorder    Complication of anesthesia    COPD (chronic obstructive pulmonary disease)    Essential hypertension, benign 09/26/2007   Genetic testing 09/16/2021   Negative hereditary cancer genetic testing: no pathogenic variants detected in CancerNext-Expanded +RNAinsight Panel.  Report date is September 06, 2021.      The CancerNext-Expanded gene panel offered by [facility] and includes sequencing, rearrangement, and RNA analysis for the following 77 genes: AIP, ALK, APC, ATM, AXIN2, BAP1, BARD1, BLM, BMPR1A, BRCA1, BRCA2, BRIP1, CDC73, CDH1, CDK4, CDKN1B   GERD 11/03/2005   Hiatal hernia 2008   History of CVA (cerebrovascular accident) 09/24/2007   left cerebellar infarction, PICA territory    History of endometrial cancer 04/30/2013   uterine   History of GI bleed 07/04/2015   Hyperlipidemia 11/03/2005   LDL goal <100   Interstitial lung disease 03/28/2019   Iron deficiency anemia, unspecified    Low serum vitamin B12 08/26/2021   Lymphedema of both lower extremities 08/25/2022   Macular degeneration    Malignant neoplasm of upper-outer quadrant of right breast in female, estrogen receptor positive 02/21/2011   Mild vascular neurocognitive disorder 05/14/2021   OSA (obstructive sleep apnea) 04/28/2022   04/2022: HST, AHI 61.9/hr, no central apneas.  Set to 8 cm water pressure based on AutoPap results.     Osteoporosis 11/19/2014   DEXA 10/27/14.  Repeat in 2 yr.  High risk to start medication.    Polyp of colon 2008, 2014   Benign   PONV (postoperative nausea and vomiting)    Primary osteoarthritis of right knee 06/22/2015   Senile purpura 10/12/2022   Shingles 2008   Squamous cell skin cancer, face 03/22/2012   Actively seeing dermatology.   Tendonitis    rt hand   Venous stasis of both lower extremities 08/14/2018   Vocal cord polyp    Past Surgical History:  Procedure Laterality Date   ABDOMINAL HYSTERECTOMY  2005   APPENDECTOMY  BASAL CELL CARCINOMA EXCISION     biospy  10/28/2009   Shave Biospy skin Left hand(Acitinic Keratoses), Right Upper Arm (superficial Basal Cell), Right Upper Back(Superficial Basal Cell), Left Neck ( Solar Lentigo & Seborrheic Keratoses)   BREAST BIOPSY  02/11/2011   Right Breast Needle Core Biospy - Upper Outer Quadrant; ER/PR 100%, Her-2 Neu neg.; Ki-67 10%   BREAST LUMPECTOMY WITH RADIOACTIVE SEED LOCALIZATION Right 06/12/2018   Procedure: RIGHT BREAST LUMPECTOMY WITH RADIOACTIVE SEED LOCALIZATION;  Surgeon: Ebbie Cough, MD;  Location: [facility];  Service: General;  Laterality: Right;   BREAST MAMMOSITE  03/14/2011   Procedure: MAMMOSITE BREAST;  Surgeon: Cough Ebbie, MD;  Location: [facility];  Service: General;  Laterality: Right;   BREAST SURGERY     right breast lumpectomy snbx   CHOLECYSTECTOMY  2004   COLONOSCOPY     COLONOSCOPY W/ POLYPECTOMY     2008, 2014 (benign)   COLONOSCOPY WITH PROPOFOL  N/A 07/09/2015   Procedure: COLONOSCOPY WITH PROPOFOL ;  Surgeon: Lesta JULIANNA Fitz, MD;  Location: [facility] ENDOSCOPY;  Service: Endoscopy;  Laterality: N/A;   DILATION AND CURETTAGE OF UTERUS N/A 04/24/2013   Procedure: DILATATION AND CURETTAGE;  Surgeon: Burnard VEAR Pate, MD;  Location: [facility];  Service: Gynecology;  Laterality: N/A;   ESOPHAGOGASTRODUODENOSCOPY N/A 07/06/2015   Procedure: ESOPHAGOGASTRODUODENOSCOPY (EGD);  Surgeon: Jerrell Sol, MD;  Location: [facility] ENDOSCOPY;  Service: Endoscopy;  Laterality: N/A;   HYSTEROSCOPY N/A 04/24/2013   Procedure: HYSTEROSCOPY;  Surgeon: Burnard VEAR Pate, MD;  Location: [facility];  Service: Gynecology;  Laterality: N/A;   ROBOTIC ASSISTED TOTAL HYSTERECTOMY WITH BILATERAL SALPINGO OOPHERECTOMY Bilateral 06/11/2013   Procedure: ROBOTIC ASSISTED TOTAL HYSTERECTOMY WITH BILATERAL SALPINGO OOPHORECTOMY WITH POSSIBLE STAGING, EPISIOTOMY;  Surgeon: Sari Bachelor, MD;  Location: [facility];  Service: Gynecology;  Laterality: Bilateral;   SQUAMOUS CELL CARCINOMA EXCISION     TEAR DUCT PROBING     TONSILLECTOMY     vocal cord poylps  1969   excision   Patient Active Problem List   Diagnosis Date Noted   Urge incontinence 08/08/2023   Senile purpura 10/12/2022   Abnormal hemoglobin 08/25/2022   Lymphedema of both lower extremities 08/25/2022   OSA (obstructive sleep apnea) 04/28/2022   Genetic testing 09/16/2021   Low serum vitamin B12 08/26/2021   Mild vascular neurocognitive disorder 05/14/2021   Allergic rhinitis 05/26/2020   Chronic interstitial lung disease (HCC) 03/28/2019   Bilateral cold feet 03/28/2019   Chronic constipation 08/14/2018   Venous stasis of both lower extremities 08/14/2018   Abdominal aortic aneurysm (AAA) 3.0 cm  to 5.0 cm in diameter in female 11/18/2016   Iron deficiency anemia, unspecified 07/06/2015   Primary osteoarthritis of right knee 06/22/2015   Osteoporosis 11/19/2014   Aneurysm of infrarenal abdominal aorta 11/19/2013   History of endometrial cancer 04/30/2013   Macular degeneration 09/13/2011   Malignant neoplasm of upper-outer quadrant of right breast in female, estrogen receptor positive 02/21/2011   Essential hypertension, benign 09/26/2007   History of CVA (cerebrovascular accident) 09/24/2007   Hyperlipidemia LDL goal <100 11/03/2005   GERD 11/03/2005    PCP: Alvan  REFERRING PROVIDER: Alvan  REFERRING DIAG: acute Rt sided low back pain without sciatica  Rationale for Evaluation and Treatment: Rehabilitation  THERAPY DIAG:  Muscle weakness (generalized)  Other low back pain  ONSET DATE: 11/06/23  SUBJECTIVE:  SUBJECTIVE STATEMENT: Pt reports having pain this morning however placed a heating pad on her drive here to session and it dissipated.   PERTINENT HISTORY:  OA, osteoporosis Pt states that at the beginning of the month she was lifting suitcases during traveling and she hurt her lower back. She used biofreeze which helped a little bit but pain continues to persist. Pain increases with bending and with sit <> stand and with prolonged walking (shopping). Pain decreases with tylenol , use of heating pad and seated rest. She initially felt pain near her tailbone but now pain is in lower Rt back.  PAIN:  Are you having pain? Yes: NPRS scale: 0/10 currently sore , 6/10 at worst  Pain location: Rt lower back Pain description: ache, sore Aggravating factors: bending, sit <> stand Relieving factors: meds, heat  PRECAUTIONS: Other: osteoporosis  RED FLAGS: None   WEIGHT BEARING  RESTRICTIONS: No  FALLS:  Has patient fallen in last 6 months? No  LIVING ENVIRONMENT: Lives with: lives with their family Lives in: House/apartment Has following equipment at home: None  OCCUPATION: retired - lives with daughter  PLOF: Independent and Independent with basic ADLs  PATIENT GOALS: get rid of this pain  NEXT MD VISIT: PRN  OBJECTIVE:  Note: Objective measures were completed at Evaluation unless otherwise noted.  DIAGNOSTIC FINDINGS:  No recent imaging on file  PATIENT SURVEYS:  PSFS: THE PATIENT SPECIFIC FUNCTIONAL SCALE  Place score of 0-10 (0 = unable to perform activity and 10 = able to perform activity at the same level as before injury or problem)  Activity Date: 11/22/23    Sit to stand 4    2.pick up item from floor 3    3.     4.      Average Score 3.5      Total Score = Sum of activity scores/number of activities  Minimally Detectable Change: 3 points (for single activity); 2 points (for average score)  [facility] (nd). The Patient Specific Functional Scale . Retrieved from [email or web address]   COGNITION: Overall cognitive status: Within functional limits for tasks assessed       POSTURE: rounded shoulders, forward head, and flexed trunk   PALPATION: TTP Rt QL, Rt lumbar paraspinals, Rt glutes  LUMBAR ROM:   AROM eval  Flexion 90% pain end range  Extension 50%  Right lateral flexion WFL  Left lateral flexion WFL  Right rotation 70%  Left rotation 70%   (Blank rows = not tested)  LOWER EXTREMITY MMT:    MMT Right eval Left eval Right  Left   Hip flexion 4- pain 4- 4 4  Hip extension 3+ 3+ 4+ 4+  Hip abduction 3+ 3+ 4+ 4+  Hip adduction      Hip internal rotation      Hip external rotation   5 5  Knee flexion      Knee extension      Ankle dorsiflexion      Ankle plantarflexion      Ankle inversion      Ankle eversion       (Blank rows = not  tested)   FUNCTIONAL TESTS:  30 seconds chair stand test = 4  GAIT: Distance walked: 100' Assistive device utilized: None Level of assistance: Complete Independence Comments: decreased cadence, forward flexed trunk [facility] Adult PT Treatment:  DATE: 12/06/23 Therapeutic Exercise: MMT reassessment see above  Hamstring stretch 30 sec each side  Manual Therapy: STM to right lumbar paraspinals and QL Neuromuscular re-ed: Posterior pelvic tilt 2x10 supine Dead bug UE only 2x10  Heel drag with stability ball + pelvic tilt Hip hinging in seated using dowel 2x10  Therapeutic Activity: Hip hinge + picking up ball from 4 in step x10 maintaining proper posture    [facility] Adult PT Treatment:                                                DATE: 12/05/23 Therapeutic Exercise: Adductor squeeze between legs 2x10  Forward fold for low back stretch  Manual Therapy: STM to right QL, paraspinals- increased muscle tightness Neuromuscular re-ed: AROM shoulder retraction x10  Seated dead bug x5  Therapeutic Activity: Standing forward kick out x10 each leg  Standing leg extensions x10  each leg - verbal cues for upright posture  Standing abduction x10 each leg - verbacal cues for neutral hips  Seated ball press for core activation x10     [facility] Adult PT Treatment:                                                DATE: 11/29/23 Therapeutic Exercise: Seated figure 4 stretch 2 x 30 sec bilat Seated QL stretch - forward flexion/lateral flexion Adductor squeeze in seated 2x10 with ball between knees -verbal cues for breathing techniques Manual Therapy: STM to right QL, lumbar paraspinals- tissue restriction  Neuromuscular re-ed: LAQ x10 LLE, x8 RLE AROM shoulder retraction 2x8  Therapeutic Activity: Standing forward kick out x8 each leg  Standing leg extensions 2x8 each leg - verbal cues for upright posture  Seated hip ER with green band  2x8        PATIENT EDUCATION:  Education details: PT POC and goals, HEP  Person educated: Patient Education method: Explanation, Demonstration, Tactile cues, and Verbal cues handout Education comprehension: verbalized understanding and returned demonstration  HOME EXERCISE PROGRAM: Access Code: [email or web address] URL: [email or web address] Date: 12/06/2023 Prepared by: Lavanda Cleverly  Exercises - Seated Figure 4 Piriformis Stretch  - 2 x daily - 7 x weekly - 1 sets - 3 reps - 20-30 seconds hold - Seated Thoracic Flexion and Rotation on Swiss Ball  - 1 x daily - 7 x weekly - 1 sets - 3 reps - 20-30 seconds hold - Seated Hip Adduction Isometrics with Ball  - 1 x daily - 7 x weekly - 1 sets - 10 reps - 3 seconds hold - Seated Hip Abduction with Resistance  - 1 x daily - 7 x weekly - 2 sets - 8 reps - Standing Hip Extension with Counter Support  - 1 x daily - 7 x weekly - 2 sets - 8 reps - Supine Posterior Pelvic Tilt  - 1 x daily - 7 x weekly - 3 sets - 10 reps - Supine 90/90 Abdominal Bracing  - 1 x daily - 7 x weekly - 3 sets - 10 reps - Supine 90/90 Alternating Heel Touches with Posterior Pelvic Tilt  - 1 x daily - 7 x weekly - 3 sets - 10 reps - Seated Hip Hinge with Dowel  - 1  x daily - 7 x weekly - 3 sets - 10 reps  ASSESSMENT:  CLINICAL IMPRESSION: Pt tolerated new exercises well, trialing core stabilization exercises in supine. Required verbal and tactile cues during 90/90 abdominal bracing and pelvic tilts, however improved toward ends of each set. Pt most challenged with coordination and neuromuscular re education of core muscles. Reassessed pt LE strength demonstrating increased MMT scores, reaching all her short term goals. Pt still presents with restricted tissue in the right lower back, thus performed STM, pt stated she felt pain relief. Will continue to progress as able.       GOALS: Goals reviewed with patient? Yes  SHORT TERM GOALS: Target date:  12/20/2023    Pt will be independent in initial HEP Baseline: Goal status: Progressing   2.  Pt will improve LE strength to 4/5 to improve standing and walking tolerance Baseline:  Goal status: MET 12/06/23    LONG TERM GOALS: Target date: 01/17/2024    Pt will be independent with advanced HEP Baseline:  Goal status: INITIAL  2.  Pt will improve average PSFS to >= 6.5 to demo improved functional mobility Baseline: 3.5 Goal status: INITIAL  3.  Pt will tolerate bending to pick up items off of the floor with pain <= 1/10 Baseline: 6/10 Goal status: INITIAL  4.  Pt will improve 30 second chair rise test to >= 6 to demo improved LE strength and endurance Baseline:  Goal status: INITIAL   PLAN:  PT FREQUENCY: 2x/week  PT DURATION: 8 weeks  PLANNED INTERVENTIONS: 97164- PT Re-evaluation, 97110-Therapeutic exercises, 97530- Therapeutic activity, 97112- Neuromuscular re-education, 97535- Self Care, [id number]- Manual therapy, [id number]- Gait training, [phone]- Aquatic Therapy, [phone]- Electrical stimulation (unattended), [phone] (1-2 muscles), [id number] (3+ muscles)- Dry Needling, Patient/Family education, Balance training, Stair training, Taping, Cryotherapy, and Moist heat.  PLAN FOR NEXT SESSION: update HEP PRN, manual as indicated, core strength, LAQ, standing leg extension,    DONAWERTH,KAREN, PT 12/06/2023, 4:40 PM

## 2023-12-13 ENCOUNTER — Ambulatory Visit: Admitting: Physical Therapy

## 2023-12-13 ENCOUNTER — Encounter: Payer: Self-pay | Admitting: Physical Therapy

## 2023-12-13 DIAGNOSIS — M6281 Muscle weakness (generalized): Secondary | ICD-10-CM | POA: Diagnosis not present

## 2023-12-13 DIAGNOSIS — M5459 Other low back pain: Secondary | ICD-10-CM

## 2023-12-13 NOTE — Therapy (Signed)
 OUTPATIENT PHYSICAL THERAPY THORACOLUMBAR TREATMENT   Patient Name: DEVORA TORTORELLA MRN: [id number] DOB:1932-04-17, 89 y.o., female Today's Date: 12/13/2023  END OF SESSION:  PT End of Session - 12/13/23 1405     Visit Number 6    Number of Visits 16    Date for Recertification  01/17/24    Authorization Type [employer] Medicare    Authorization - Visit Number 5    Progress Note Due on Visit 10    PT Start Time 1405    PT Stop Time 1443    PT Time Calculation (min) 38 min    Activity Tolerance Patient tolerated treatment well    Behavior During Therapy [facility] for tasks assessed/performed              Past Medical History:  Diagnosis Date   Abdominal aortic aneurysm (AAA) 3.0 cm to 5.0 cm in diameter in female 11/18/2016   Due for repeat in 12/2019.   Abnormal hemoglobin 08/25/2022   Allergic rhinitis 05/26/2020   Aneurysm of infrarenal abdominal aorta 11/19/2013   2.6 cm in 2006 on CT.   CT 10/15 show 3 cm lesion.   US   2018 - no change  CT 2020 - 2.9 cm, stable      Arthritis    hips, lower back   Basal cell carcinoma 2011   R back   Bell's palsy 1984   Bilateral cold feet 03/28/2019   Blood transfusion without reported diagnosis 07/2015   Bradycardia    Cataract    Cholecystitis 2004   Chronic constipation 08/14/2018   Clotting disorder    Complication of anesthesia    COPD (chronic obstructive pulmonary disease)    Essential hypertension, benign 09/26/2007   Genetic testing 09/16/2021   Negative hereditary cancer genetic testing: no pathogenic variants detected in CancerNext-Expanded +RNAinsight Panel.  Report date is September 06, 2021.      The CancerNext-Expanded gene panel offered by [facility] and includes sequencing, rearrangement, and RNA analysis for the following 77 genes: AIP, ALK, APC, ATM, AXIN2, BAP1, BARD1, BLM, BMPR1A, BRCA1, BRCA2, BRIP1, CDC73, CDH1, CDK4, CDKN1B   GERD 11/03/2005   Hiatal hernia 2008   History of CVA (cerebrovascular accident)  09/24/2007   left cerebellar infarction, PICA territory   History of endometrial cancer 04/30/2013   uterine   History of GI bleed 07/04/2015   Hyperlipidemia 11/03/2005   LDL goal <100   Interstitial lung disease 03/28/2019   Iron deficiency anemia, unspecified    Low serum vitamin B12 08/26/2021   Lymphedema of both lower extremities 08/25/2022   Macular degeneration    Malignant neoplasm of upper-outer quadrant of right breast in female, estrogen receptor positive 02/21/2011   Mild vascular neurocognitive disorder 05/14/2021   OSA (obstructive sleep apnea) 04/28/2022   04/2022: HST, AHI 61.9/hr, no central apneas.  Set to 8 cm water pressure based on AutoPap results.     Osteoporosis 11/19/2014   DEXA 10/27/14.  Repeat in 2 yr.  High risk to start medication.    Polyp of colon 2008, 2014   Benign   PONV (postoperative nausea and vomiting)    Primary osteoarthritis of right knee 06/22/2015   Senile purpura 10/12/2022   Shingles 2008   Squamous cell skin cancer, face 03/22/2012   Actively seeing dermatology.   Tendonitis    rt hand   Venous stasis of both lower extremities 08/14/2018   Vocal cord polyp    Past Surgical History:  Procedure Laterality Date  ABDOMINAL HYSTERECTOMY  2005   APPENDECTOMY     BASAL CELL CARCINOMA EXCISION     biospy  10/28/2009   Shave Biospy skin Left hand(Acitinic Keratoses), Right Upper Arm (superficial Basal Cell), Right Upper Back(Superficial Basal Cell), Left Neck ( Solar Lentigo & Seborrheic Keratoses)   BREAST BIOPSY  02/11/2011   Right Breast Needle Core Biospy - Upper Outer Quadrant; ER/PR 100%, Her-2 Neu neg.; Ki-67 10%   BREAST LUMPECTOMY WITH RADIOACTIVE SEED LOCALIZATION Right 06/12/2018   Procedure: RIGHT BREAST LUMPECTOMY WITH RADIOACTIVE SEED LOCALIZATION;  Surgeon: Ebbie Cough, MD;  Location: [facility];  Service: General;  Laterality: Right;   BREAST MAMMOSITE  03/14/2011   Procedure: MAMMOSITE BREAST;   Surgeon: Cough Ebbie, MD;  Location: [facility];  Service: General;  Laterality: Right;   BREAST SURGERY     right breast lumpectomy snbx   CHOLECYSTECTOMY  2004   COLONOSCOPY     COLONOSCOPY W/ POLYPECTOMY     2008, 2014 (benign)   COLONOSCOPY WITH PROPOFOL  N/A 07/09/2015   Procedure: COLONOSCOPY WITH PROPOFOL ;  Surgeon: Lesta JULIANNA Fitz, MD;  Location: [facility] ENDOSCOPY;  Service: Endoscopy;  Laterality: N/A;   DILATION AND CURETTAGE OF UTERUS N/A 04/24/2013   Procedure: DILATATION AND CURETTAGE;  Surgeon: Burnard VEAR Pate, MD;  Location: [facility];  Service: Gynecology;  Laterality: N/A;   ESOPHAGOGASTRODUODENOSCOPY N/A 07/06/2015   Procedure: ESOPHAGOGASTRODUODENOSCOPY (EGD);  Surgeon: Jerrell Sol, MD;  Location: [facility] ENDOSCOPY;  Service: Endoscopy;  Laterality: N/A;   HYSTEROSCOPY N/A 04/24/2013   Procedure: HYSTEROSCOPY;  Surgeon: Burnard VEAR Pate, MD;  Location: [facility];  Service: Gynecology;  Laterality: N/A;   ROBOTIC ASSISTED TOTAL HYSTERECTOMY WITH BILATERAL SALPINGO OOPHERECTOMY Bilateral 06/11/2013   Procedure: ROBOTIC ASSISTED TOTAL HYSTERECTOMY WITH BILATERAL SALPINGO OOPHORECTOMY WITH POSSIBLE STAGING, EPISIOTOMY;  Surgeon: Sari Bachelor, MD;  Location: [facility];  Service: Gynecology;  Laterality: Bilateral;   SQUAMOUS CELL CARCINOMA EXCISION     TEAR DUCT PROBING     TONSILLECTOMY     vocal cord poylps  1969   excision   Patient Active Problem List   Diagnosis Date Noted   Urge incontinence 08/08/2023   Senile purpura 10/12/2022   Abnormal hemoglobin 08/25/2022   Lymphedema of both lower extremities 08/25/2022   OSA (obstructive sleep apnea) 04/28/2022   Genetic testing 09/16/2021   Low serum vitamin B12 08/26/2021   Mild vascular neurocognitive disorder 05/14/2021   Allergic rhinitis 05/26/2020   Chronic interstitial lung disease (HCC) 03/28/2019   Bilateral cold feet 03/28/2019   Chronic constipation 08/14/2018   Venous stasis of both lower extremities  08/14/2018   Abdominal aortic aneurysm (AAA) 3.0 cm to 5.0 cm in diameter in female 11/18/2016   Iron deficiency anemia, unspecified 07/06/2015   Primary osteoarthritis of right knee 06/22/2015   Osteoporosis 11/19/2014   Aneurysm of infrarenal abdominal aorta 11/19/2013   History of endometrial cancer 04/30/2013   Macular degeneration 09/13/2011   Malignant neoplasm of upper-outer quadrant of right breast in female, estrogen receptor positive 02/21/2011   Essential hypertension, benign 09/26/2007   History of CVA (cerebrovascular accident) 09/24/2007   Hyperlipidemia LDL goal <100 11/03/2005   GERD 11/03/2005    PCP: Alvan  REFERRING PROVIDER: Alvan  REFERRING DIAG: acute Rt sided low back pain without sciatica  Rationale for Evaluation and Treatment: Rehabilitation  THERAPY DIAG:  Muscle weakness (generalized)  Other low back pain  ONSET DATE: 11/06/23  SUBJECTIVE:  SUBJECTIVE STATEMENT: Pt brought friend who presented with pt's medical history, and stated pt really benefited from last session's manual therapy, but hurt her back later that day from shopping too long, thus was unable to complete exercises for the remainder of the week. She explained if pt were to receive manual therapy again today, her friend help her complete the exercises consistently.   PERTINENT HISTORY:  OA, osteoporosis Pt states that at the beginning of the month she was lifting suitcases during traveling and she hurt her lower back. She used biofreeze which helped a little bit but pain continues to persist. Pain increases with bending and with sit <> stand and with prolonged walking (shopping). Pain decreases with tylenol , use of heating pad and seated rest. She initially felt pain near her tailbone but now pain is in  lower Rt back.  PAIN:  Are you having pain? Yes: NPRS scale: 8-9/10 currently sore , 6/10 at worst  Pain location: Rt lower back Pain description: ache, sore Aggravating factors: bending, sit <> stand Relieving factors: meds, heat  PRECAUTIONS: Other: osteoporosis  RED FLAGS: None   WEIGHT BEARING RESTRICTIONS: No  FALLS:  Has patient fallen in last 6 months? No  LIVING ENVIRONMENT: Lives with: lives with their family Lives in: House/apartment Has following equipment at home: None  OCCUPATION: retired - lives with daughter  PLOF: Independent and Independent with basic ADLs  PATIENT GOALS: get rid of this pain  NEXT MD VISIT: PRN  OBJECTIVE:  Note: Objective measures were completed at Evaluation unless otherwise noted.  DIAGNOSTIC FINDINGS:  No recent imaging on file  PATIENT SURVEYS:  PSFS: THE PATIENT SPECIFIC FUNCTIONAL SCALE  Place score of 0-10 (0 = unable to perform activity and 10 = able to perform activity at the same level as before injury or problem)  Activity Date: 11/22/23    Sit to stand 4    2.pick up item from floor 3    3.     4.      Average Score 3.5      Total Score = Sum of activity scores/number of activities  Minimally Detectable Change: 3 points (for single activity); 2 points (for average score)  [facility] (nd). The Patient Specific Functional Scale . Retrieved from [email or web address]   COGNITION: Overall cognitive status: Within functional limits for tasks assessed       POSTURE: rounded shoulders, forward head, and flexed trunk   PALPATION: TTP Rt QL, Rt lumbar paraspinals, Rt glutes  LUMBAR ROM:   AROM eval  Flexion 90% pain end range  Extension 50%  Right lateral flexion WFL  Left lateral flexion WFL  Right rotation 70%  Left rotation 70%   (Blank rows = not tested)  LOWER EXTREMITY MMT:    MMT Right eval Left eval Right  Left   Hip  flexion 4- pain 4- 4 4  Hip extension 3+ 3+ 4+ 4+  Hip abduction 3+ 3+ 4+ 4+  Hip adduction      Hip internal rotation      Hip external rotation   5 5  Knee flexion      Knee extension      Ankle dorsiflexion      Ankle plantarflexion      Ankle inversion      Ankle eversion       (Blank rows = not tested)   FUNCTIONAL TESTS:  30 seconds chair stand test = 4  GAIT: Distance walked: 100' Assistive device  utilized: None Level of assistance: Complete Independence Comments: decreased cadence, forward flexed trunk  [facility] Adult PT Treatment:                                                DATE: 12/13/23  Manual Therapy: STM to right paraspinals, QL, Superior glute region -tissue restriction throughout  Neuromuscular re-ed: Hip hinging x10 - to promote good mechanics for sit to stand transfer  Posterior pelvic tilt seated x10 Hip ER with green band 2x10 Seated marches green band x40 + core engagement  Adductor squeeze with ball 2x10 Self Care: Discussion regarding pt alleviated symptoms after STM from last session and plan to continue strengthening at home     [facility] Adult PT Treatment:                                                DATE: 12/06/23 Therapeutic Exercise: MMT reassessment see above  Hamstring stretch 30 sec each side  Manual Therapy: STM to right lumbar paraspinals and QL Neuromuscular re-ed: Posterior pelvic tilt 2x10 supine Dead bug UE only 2x10  Heel drag with stability ball + pelvic tilt Hip hinging in seated using dowel 2x10  Therapeutic Activity: Hip hinge + picking up ball from 4 in step x10 maintaining proper posture    [facility] Adult PT Treatment:                                                DATE: 12/05/23 Therapeutic Exercise: Adductor squeeze between legs 2x10  Forward fold for low back stretch  Manual Therapy: STM to right QL, paraspinals- increased muscle tightness Neuromuscular re-ed: AROM shoulder retraction x10  Seated dead bug x5   Therapeutic Activity: Standing forward kick out x10 each leg  Standing leg extensions x10  each leg - verbal cues for upright posture  Standing abduction x10 each leg - verbacal cues for neutral hips  Seated ball press for core activation x10     [facility] Adult PT Treatment:                                                DATE: 11/29/23 Therapeutic Exercise: Seated figure 4 stretch 2 x 30 sec bilat Seated QL stretch - forward flexion/lateral flexion Adductor squeeze in seated 2x10 with ball between knees -verbal cues for breathing techniques Manual Therapy: STM to right QL, lumbar paraspinals- tissue restriction  Neuromuscular re-ed: LAQ x10 LLE, x8 RLE AROM shoulder retraction 2x8  Therapeutic Activity: Standing forward kick out x8 each leg  Standing leg extensions 2x8 each leg - verbal cues for upright posture  Seated hip ER with green band 2x8        PATIENT EDUCATION:  Education details: PT POC and goals, HEP  Person educated: Patient Education method: Explanation, Demonstration, Tactile cues, and Verbal cues handout Education comprehension: verbalized understanding and returned demonstration  HOME EXERCISE PROGRAM: Access Code: [email or web address] URL: [email or web address] Date: 12/06/2023 Prepared by: Lavanda Cleverly  Exercises - Seated Figure 4 Piriformis Stretch  - 2 x daily - 7 x weekly - 1 sets - 3 reps - 20-30 seconds hold - Seated Thoracic Flexion and Rotation on Swiss Ball  - 1 x daily - 7 x weekly - 1 sets - 3 reps - 20-30 seconds hold - Seated Hip Adduction Isometrics with Ball  - 1 x daily - 7 x weekly - 1 sets - 10 reps - 3 seconds hold - Seated Hip Abduction with Resistance  - 1 x daily - 7 x weekly - 2 sets - 8 reps - Standing Hip Extension with Counter Support  - 1 x daily - 7 x weekly - 2 sets - 8 reps - Supine Posterior Pelvic Tilt  - 1 x daily - 7 x weekly - 3 sets - 10 reps - Supine 90/90 Abdominal Bracing  - 1 x daily - 7 x weekly - 3 sets - 10  reps - Supine 90/90 Alternating Heel Touches with Posterior Pelvic Tilt  - 1 x daily - 7 x weekly - 3 sets - 10 reps - Seated Hip Hinge with Dowel  - 1 x daily - 7 x weekly - 3 sets - 10 reps  ASSESSMENT:  CLINICAL IMPRESSION:   Continued manual therapy to pt's right low back side area per pt request, finding significant tissue restriction and tenderness in area. Reports coming in with 8-9/10 but after massage reports no pain just soreness from the mechanical stress/pressure. States not feeling anything during hip ER with band, redirected to engage core while performing, and only felt soreness in right low back. Attempted to incorporate hip hinge during sit to stands, however pt stated she could not perform, she needed UE support. Pt left session stating no pain. Will continue to progress as able.     GOALS: Goals reviewed with patient? Yes  SHORT TERM GOALS: Target date: 12/20/2023    Pt will be independent in initial HEP Baseline: Goal status: Progressing   2.  Pt will improve LE strength to 4/5 to improve standing and walking tolerance Baseline:  Goal status: MET 12/06/23    LONG TERM GOALS: Target date: 01/17/2024    Pt will be independent with advanced HEP Baseline:  Goal status: INITIAL  2.  Pt will improve average PSFS to >= 6.5 to demo improved functional mobility Baseline: 3.5 Goal status: INITIAL  3.  Pt will tolerate bending to pick up items off of the floor with pain <= 1/10 Baseline: 6/10 Goal status: Progressing   4.  Pt will improve 30 second chair rise test to >= 6 to demo improved LE strength and endurance Baseline:  Goal status: INITIAL   PLAN:  PT FREQUENCY: 2x/week  PT DURATION: 8 weeks  PLANNED INTERVENTIONS: 97164- PT Re-evaluation, 97110-Therapeutic exercises, 97530- Therapeutic activity, 97112- Neuromuscular re-education, 97535- Self Care, [id number]- Manual therapy, [id number]- Gait training, [phone]- Aquatic Therapy, [phone]- Electrical  stimulation (unattended), [phone] (1-2 muscles), [phone] (3+ muscles)- Dry Needling, Patient/Family education, Balance training, Stair training, Taping, Cryotherapy, and Moist heat.  PLAN FOR NEXT SESSION: update HEP PRN, manual as indicated, core strength, LAQ, standing leg extension, STM to right QL, hip hinge to practice picking object off floor     Lavanda Cleverly, Student-PT 12/13/2023, 2:45 PM

## 2023-12-19 NOTE — Therapy (Signed)
 OUTPATIENT PHYSICAL THERAPY THORACOLUMBAR TREATMENT   Patient Name: Michelle [name] MRN: [id number] DOB:17-Apr-1932, 89 y.o., female Today's Date: 12/20/2023  END OF SESSION:  PT End of Session - 12/20/23 1406     Visit Number 7    Number of Visits 16    Date for Recertification  01/17/24    Authorization Type [employer]    Authorization - Visit Number 6    Progress Note Due on Visit 10    PT Start Time 1406    PT Stop Time 1445    PT Time Calculation (min) 39 min    Activity Tolerance Patient tolerated treatment well    Behavior During Therapy [facility] for tasks assessed/performed               Past Medical History:  Diagnosis Date   Abdominal aortic aneurysm (AAA) 3.0 cm to 5.0 cm in diameter in female 11/18/2016   Due for repeat in 12/2019.   Abnormal hemoglobin 08/25/2022   Allergic rhinitis 05/26/2020   Aneurysm of infrarenal abdominal aorta 11/19/2013   2.6 cm in 2006 on CT.   CT 10/15 show 3 cm lesion.   US   2018 - no change  CT 2020 - 2.9 cm, stable      Arthritis    hips, lower back   Basal cell carcinoma 2011   R back   Bell's palsy 1984   Bilateral cold feet 03/28/2019   Blood transfusion without reported diagnosis 07/2015   Bradycardia    Cataract    Cholecystitis 2004   Chronic constipation 08/14/2018   Clotting disorder    Complication of anesthesia    COPD (chronic obstructive pulmonary disease)    Essential hypertension, benign 09/26/2007   Genetic testing 09/16/2021   Negative hereditary cancer genetic testing: no pathogenic variants detected in CancerNext-Expanded +RNAinsight Panel.  Report date is September 06, 2021.      The CancerNext-Expanded gene panel offered by [facility] and includes sequencing, rearrangement, and RNA analysis for the following 77 genes: AIP, ALK, APC, ATM, AXIN2, BAP1, BARD1, BLM, BMPR1A, BRCA1, BRCA2, BRIP1, CDC73, CDH1, CDK4, CDKN1B   GERD 11/03/2005   Hiatal hernia 2008   History of CVA (cerebrovascular accident)  09/24/2007   left cerebellar infarction, PICA territory   History of endometrial cancer 04/30/2013   uterine   History of GI bleed 07/04/2015   Hyperlipidemia 11/03/2005   LDL goal <100   Interstitial lung disease 03/28/2019   Iron deficiency anemia, unspecified    Low serum vitamin B12 08/26/2021   Lymphedema of both lower extremities 08/25/2022   Macular degeneration    Malignant neoplasm of upper-outer quadrant of right breast in female, estrogen receptor positive 02/21/2011   Mild vascular neurocognitive disorder 05/14/2021   OSA (obstructive sleep apnea) 04/28/2022   04/2022: HST, AHI 61.9/hr, no central apneas.  Set to 8 cm water pressure based on AutoPap results.     Osteoporosis 11/19/2014   DEXA 10/27/14.  Repeat in 2 yr.  High risk to start medication.    Polyp of colon 2008, 2014   Benign   PONV (postoperative nausea and vomiting)    Primary osteoarthritis of right knee 06/22/2015   Senile purpura 10/12/2022   Shingles 2008   Squamous cell skin cancer, face 03/22/2012   Actively seeing dermatology.   Tendonitis    rt hand   Venous stasis of both lower extremities 08/14/2018   Vocal cord polyp    Past Surgical History:  Procedure Laterality Date  ABDOMINAL HYSTERECTOMY  2005   APPENDECTOMY     BASAL CELL CARCINOMA EXCISION     biospy  10/28/2009   Shave Biospy skin Left hand(Acitinic Keratoses), Right Upper Arm (superficial Basal Cell), Right Upper Back(Superficial Basal Cell), Left Neck ( Solar Lentigo & Seborrheic Keratoses)   BREAST BIOPSY  02/11/2011   Right Breast Needle Core Biospy - Upper Outer Quadrant; ER/PR 100%, Her-2 Neu neg.; Ki-67 10%   BREAST LUMPECTOMY WITH RADIOACTIVE SEED LOCALIZATION Right 06/12/2018   Procedure: RIGHT BREAST LUMPECTOMY WITH RADIOACTIVE SEED LOCALIZATION;  Surgeon: Ebbie Cough, MD;  Location: [facility];  Service: General;  Laterality: Right;   BREAST MAMMOSITE  03/14/2011   Procedure: MAMMOSITE BREAST;   Surgeon: Cough Ebbie, MD;  Location: [facility];  Service: General;  Laterality: Right;   BREAST SURGERY     right breast lumpectomy snbx   CHOLECYSTECTOMY  2004   COLONOSCOPY     COLONOSCOPY W/ POLYPECTOMY     2008, 2014 (benign)   COLONOSCOPY WITH PROPOFOL  N/A 07/09/2015   Procedure: COLONOSCOPY WITH PROPOFOL ;  Surgeon: Lesta JULIANNA Fitz, MD;  Location: [facility] END[facility]COPY;  Service: Endoscopy;  Laterality: N/A;   DILATION AND CURETTAGE OF UTERUS N/A 04/24/2013   Procedure: DILATATION AND CURETTAGE;  Surgeon: Burnard VEAR Pate, MD;  Location: [facility];  Service: Gynecology;  Laterality: N/A;   ESOPHAGOGASTRODUODENOSCOPY N/A 07/06/2015   Procedure: ESOPHAGOGASTRODUODENOSCOPY (EGD);  Surgeon: Jerrell Sol, MD;  Location: [facility]COPY;  Service: Endoscopy;  Laterality: N/A;   HYSTEROSCOPY N/A 04/24/2013   Procedure: HYSTEROSCOPY;  Surgeon: Burnard VEAR Pate, MD;  Location: [facility];  Service: Gynecology;  Laterality: N/A;   ROBOTIC ASSISTED TOTAL HYSTERECTOMY WITH BILATERAL SALPINGO OOPHERECTOMY Bilateral 06/11/2013   Procedure: ROBOTIC ASSISTED TOTAL HYSTERECTOMY WITH BILATERAL SALPINGO OOPHORECTOMY WITH POSSIBLE STAGING, EPISIOTOMY;  Surgeon: Sari Bachelor, MD;  Location: [facility];  Service: Gynecology;  Laterality: Bilateral;   SQUAMOUS CELL CARCINOMA EXCISION     TEAR DUCT PROBING     TONSILLECTOMY     vocal cord poylps  1969   excision   Patient Active Problem List   Diagnosis Date Noted   Urge incontinence 08/08/2023   Senile purpura 10/12/2022   Abnormal hemoglobin 08/25/2022   Lymphedema of both lower extremities 08/25/2022   OSA (obstructive sleep apnea) 04/28/2022   Genetic testing 09/16/2021   Low serum vitamin B12 08/26/2021   Mild vascular neurocognitive disorder 05/14/2021   Allergic rhinitis 05/26/2020   Chronic interstitial lung disease (HCC) 03/28/2019   Bilateral cold feet 03/28/2019   Chronic constipation 08/14/2018   Venous stasis of both lower extremities  08/14/2018   Abdominal aortic aneurysm (AAA) 3.0 cm to 5.0 cm in diameter in female 11/18/2016   Iron deficiency anemia, unspecified 07/06/2015   Primary osteoarthritis of right knee 06/22/2015   Osteoporosis 11/19/2014   Aneurysm of infrarenal abdominal aorta 11/19/2013   History of endometrial cancer 04/30/2013   Macular degeneration 09/13/2011   Malignant neoplasm of upper-outer quadrant of right breast in female, estrogen receptor positive 02/21/2011   Essential hypertension, benign 09/26/2007   History of CVA (cerebrovascular accident) 09/24/2007   Hyperlipidemia LDL goal <100 11/03/2005   GERD 11/03/2005    PCP: Alvan  REFERRING PROVIDER: Alvan  REFERRING DIAG: acute Rt sided low back pain without sciatica  Rationale for Evaluation and Treatment: Rehabilitation  THERAPY DIAG:  Muscle weakness (generalized)  Other low back pain  ONSET DATE: 11/06/23  SUBJECTIVE:  SUBJECTIVE STATEMENT:  Pt reports her back is bothering her. She woke up on her right side and thinks that maybe why it's hurting. She has been doing her HEP.   PERTINENT HISTORY:  OA, osteoporosis Pt states that at the beginning of the month she was lifting suitcases during traveling and she hurt her lower back. She used biofreeze which helped a little bit but pain continues to persist. Pain increases with bending and with sit <> stand and with prolonged walking (shopping). Pain decreases with tylenol , use of heating pad and seated rest. She initially felt pain near her tailbone but now pain is in lower Rt back.  PAIN:  Are you having pain? Yes: NPRS scale: 0/10 currently sore , 6/10 at worst  Pain location: Rt lower back Pain description: ache, sore Aggravating factors: bending, sit <> stand Relieving factors: meds,  heat  PRECAUTIONS: Other: osteoporosis  RED FLAGS: None   WEIGHT BEARING RESTRICTIONS: No  FALLS:  Has patient fallen in last 6 months? No  LIVING ENVIRONMENT: Lives with: lives with their family Lives in: House/apartment Has following equipment at home: None  OCCUPATION: retired - lives with daughter  PLOF: Independent and Independent with basic ADLs  PATIENT GOALS: get rid of this pain  NEXT MD VISIT: PRN  OBJECTIVE:  Note: Objective measures were completed at Evaluation unless otherwise noted.  DIAGNOSTIC FINDINGS:  No recent imaging on file  PATIENT SURVEYS:  PSFS: THE PATIENT SPECIFIC FUNCTIONAL SCALE  Place score of 0-10 (0 = unable to perform activity and 10 = able to perform activity at the same level as before injury or problem)  Activity Date: 11/22/23 12/20/23   Sit to stand 4 10   2.pick up item from floor 3 10   3.     4.      Average Score 3.5 10     Total Score = Sum of activity scores/number of activities  Minimally Detectable Change: 3 points (for single activity); 2 points (for average score)  [facility] (nd). The Patient Specific Functional Scale . Retrieved from [email or web address]   COGNITION: Overall cognitive status: Within functional limits for tasks assessed       POSTURE: rounded shoulders, forward head, and flexed trunk   PALPATION: TTP Rt QL, Rt lumbar paraspinals, Rt glutes  LUMBAR ROM:   AROM eval  Flexion 90% pain end range  Extension 50%  Right lateral flexion WFL  Left lateral flexion WFL  Right rotation 70%  Left rotation 70%   (Blank rows = not tested)  LOWER EXTREMITY MMT:    MMT Right eval Left eval Right  Left   Hip flexion 4- pain 4- 4 4  Hip extension 3+ 3+ 4+ 4+  Hip abduction 3+ 3+ 4+ 4+  Hip adduction      Hip internal rotation      Hip external rotation   5 5  Knee flexion      Knee extension      Ankle dorsiflexion       Ankle plantarflexion      Ankle inversion      Ankle eversion       (Blank rows = not tested)   FUNCTIONAL TESTS:  30 seconds chair stand test = 4  GAIT: Distance walked: 100' Assistive device utilized: None Level of assistance: Complete Independence Comments: decreased cadence, forward flexed trunk [facility] Adult PT Treatment:  DATE: 12/20/23 Manual Therapy: STM to right paraspinals in lower back region, right QL, posterior rib cage Neuromuscular re-ed: Hip hinging x10 - to promote good mechanics for sit to stand transfer  Half dead lift picking up 1# weight from 6 in step + verbal cue to press heels into floor for posterior chain activation 2x10 Posterior pelvic tilt seated x10 Therapeutic Activity: Standing hip abd side kicks x10 each side - verbal cues keep leg within frontal plane (tendency to go into sagittal plane) Reviewed HEP  [facility] Adult PT Treatment:                                                DATE: 12/13/23  Manual Therapy: STM to right paraspinals, QL, Superior glute region -tissue restriction throughout  Neuromuscular re-ed: Hip hinging x10 - to promote good mechanics for sit to stand transfer  Posterior pelvic tilt seated x10 Hip ER with green band 2x10 Seated marches green band x40 + core engagement  Adductor squeeze with ball 2x10 Self Care: Discussion regarding pt alleviated symptoms after STM from last session and plan to continue strengthening at home     [facility] Adult PT Treatment:                                                DATE: 12/06/23 Therapeutic Exercise: MMT reassessment see above  Hamstring stretch 30 sec each side  Manual Therapy: STM to right lumbar paraspinals and QL Neuromuscular re-ed: Posterior pelvic tilt 2x10 supine Dead bug UE only 2x10  Heel drag with stability ball + pelvic tilt Hip hinging in seated using dowel 2x10  Therapeutic Activity: Hip hinge + picking up ball from 4 in step  x10 maintaining proper posture    [facility] Adult PT Treatment:                                                DATE: 12/05/23 Therapeutic Exercise: Adductor squeeze between legs 2x10  Forward fold for low back stretch  Manual Therapy: STM to right QL, paraspinals- increased muscle tightness Neuromuscular re-ed: AROM shoulder retraction x10  Seated dead bug x5  Therapeutic Activity: Standing forward kick out x10 each leg  Standing leg extensions x10  each leg - verbal cues for upright posture  Standing abduction x10 each leg - verbacal cues for neutral hips  Seated ball press for core activation x10     PATIENT EDUCATION:  Education details: See treatment  Person educated: Patient Education method: Explanation, Demonstration, Tactile cues, and Verbal cues  Education comprehension: verbalized understanding and returned demonstration  HOME EXERCISE PROGRAM: Access Code: [email or web address] URL: [email or web address] Date: 12/06/2023 Prepared by: Lavanda Cleverly  Exercises - Seated Figure 4 Piriformis Stretch  - 2 x daily - 7 x weekly - 1 sets - 3 reps - 20-30 seconds hold - Seated Thoracic Flexion and Rotation on Swiss Ball  - 1 x daily - 7 x weekly - 1 sets - 3 reps - 20-30 seconds hold - Seated Hip Adduction Isometrics with Ball  - 1 x daily - 7 x weekly - 1 sets -  10 reps - 3 seconds hold - Seated Hip Abduction with Resistance  - 1 x daily - 7 x weekly - 2 sets - 8 reps - Standing Hip Extension with Counter Support  - 1 x daily - 7 x weekly - 2 sets - 8 reps - Supine Posterior Pelvic Tilt  - 1 x daily - 7 x weekly - 3 sets - 10 reps - Supine 90/90 Abdominal Bracing  - 1 x daily - 7 x weekly - 3 sets - 10 reps - Supine 90/90 Alternating Heel Touches with Posterior Pelvic Tilt  - 1 x daily - 7 x weekly - 3 sets - 10 reps - Seated Hip Hinge with Dowel  - 1 x daily - 7 x weekly - 3 sets - 10 reps  ASSESSMENT:  CLINICAL IMPRESSION: Pt presents to skilled therapy with decreased  back pain since starting PT. She demonstrated good carryover from last session with proper mechanics during half dead lift, picking up 1# weight from 6 in step. Required verbal cues to engage core and press feet into floor to promote posterior chain activation. Pt tolerated hip hinging series well, not using her back and bending at the hips. Per pt request, STM to right lower paraspinals. Pt has decreased tissue restriction since 3 weeks ago, however still presents with pain in that area upon palpation. Will continue to progress as able.    GOALS: Goals reviewed with patient? Yes  SHORT TERM GOALS: Target date: 12/20/2023    Pt will be independent in initial HEP Baseline: Goal status: MET on 12/20/23  2.  Pt will improve LE strength to 4/5 to improve standing and walking tolerance Baseline:  Goal status: MET 12/06/23    LONG TERM GOALS: Target date: 01/17/2024    Pt will be independent with advanced HEP Baseline:  Goal status: INITIAL  2.  Pt will improve average PSFS to >= 6.5 to demo improved functional mobility Baseline: 3.5 Goal status: MET on 12/20/23 with 10  3.  Pt will tolerate bending to pick up items off of the floor with pain <= 1/10 Baseline: 6/10 Goal status: MET on 12/20/23  4.  Pt will improve 30 second chair rise test to >= 6 to demo improved LE strength and endurance Baseline:  Goal status: INITIAL   PLAN:  PT FREQUENCY: 2x/week  PT DURATION: 8 weeks  PLANNED INTERVENTIONS: 97164- PT Re-evaluation, 97110-Therapeutic exercises, 97530- Therapeutic activity, 97112- Neuromuscular re-education, 97535- Self Care, [id number]- Manual therapy, [id number]- Gait training, [phone]- Aquatic Therapy, [phone]- Electrical stimulation (unattended), [phone] (1-2 muscles), [id number] (3+ muscles)- Dry Needling, Patient/Family education, Balance training, Stair training, Taping, Cryotherapy, and Moist heat.  PLAN FOR NEXT SESSION: update HEP PRN, manual as indicated, core strength, LAQ,  standing leg extension, STM to right QL, hip hinge to practice picking object off floor in seated, transition into standing    Lavanda Cleverly, Student-PT 12/20/2023, 4:14 PM

## 2023-12-20 ENCOUNTER — Encounter: Payer: Self-pay | Admitting: Physical Therapy

## 2023-12-20 ENCOUNTER — Ambulatory Visit: Payer: Self-pay | Admitting: Physical Therapy

## 2023-12-20 DIAGNOSIS — M6281 Muscle weakness (generalized): Secondary | ICD-10-CM

## 2023-12-20 DIAGNOSIS — M5459 Other low back pain: Secondary | ICD-10-CM

## 2024-01-03 ENCOUNTER — Ambulatory Visit: Admitting: Physical Therapy

## 2024-01-04 ENCOUNTER — Ambulatory Visit

## 2024-01-04 DIAGNOSIS — M6281 Muscle weakness (generalized): Secondary | ICD-10-CM | POA: Insufficient documentation

## 2024-01-04 DIAGNOSIS — M5459 Other low back pain: Secondary | ICD-10-CM | POA: Insufficient documentation

## 2024-01-04 NOTE — Therapy (Addendum)
 OUTPATIENT PHYSICAL THERAPY THORACOLUMBAR TREATMENT   Patient Name: Michelle [name] MRN: [id number] DOB:10-17-1932, 89 y.o., female Today's Date: 01/04/2024  END OF SESSION:  PT End of Session - 01/04/24 1531     Visit Number 8    Number of Visits 16    Date for Recertification  01/17/24    Authorization Type [employer] Medicare    Authorization - Visit Number 7    Progress Note Due on Visit 10    PT Start Time 1532    PT Stop Time 1615    PT Time Calculation (min) 43 min    Activity Tolerance Patient tolerated treatment well    Behavior During Therapy [facility] for tasks assessed/performed               Past Medical History:  Diagnosis Date   Abdominal aortic aneurysm (AAA) 3.0 cm to 5.0 cm in diameter in female 11/18/2016   Due for repeat in 12/2019.   Abnormal hemoglobin 08/25/2022   Allergic rhinitis 05/26/2020   Aneurysm of infrarenal abdominal aorta 11/19/2013   2.6 cm in 2006 on CT.   CT 10/15 show 3 cm lesion.   US   2018 - no change  CT 2020 - 2.9 cm, stable      Arthritis    hips, lower back   Basal cell carcinoma 2011   R back   Bell's palsy 1984   Bilateral cold feet 03/28/2019   Blood transfusion without reported diagnosis 07/2015   Bradycardia    Cataract    Cholecystitis 2004   Chronic constipation 08/14/2018   Clotting disorder    Complication of anesthesia    COPD (chronic obstructive pulmonary disease)    Essential hypertension, benign 09/26/2007   Genetic testing 09/16/2021   Negative hereditary cancer genetic testing: no pathogenic variants detected in CancerNext-Expanded +RNAinsight Panel.  Report date is September 06, 2021.      The CancerNext-Expanded gene panel offered by [facility] and includes sequencing, rearrangement, and RNA analysis for the following 77 genes: AIP, ALK, APC, ATM, AXIN2, BAP1, BARD1, BLM, BMPR1A, BRCA1, BRCA2, BRIP1, CDC73, CDH1, CDK4, CDKN1B   GERD 11/03/2005   Hiatal hernia 2008   History of CVA (cerebrovascular accident)  09/24/2007   left cerebellar infarction, PICA territory   History of endometrial cancer 04/30/2013   uterine   History of GI bleed 07/04/2015   Hyperlipidemia 11/03/2005   LDL goal <100   Interstitial lung disease 03/28/2019   Iron deficiency anemia, unspecified    Low serum vitamin B12 08/26/2021   Lymphedema of both lower extremities 08/25/2022   Macular degeneration    Malignant neoplasm of upper-outer quadrant of right breast in female, estrogen receptor positive 02/21/2011   Mild vascular neurocognitive disorder 05/14/2021   OSA (obstructive sleep apnea) 04/28/2022   04/2022: HST, AHI 61.9/hr, no central apneas.  Set to 8 cm water pressure based on AutoPap results.     Osteoporosis 11/19/2014   DEXA 10/27/14.  Repeat in 2 yr.  High risk to start medication.    Polyp of colon 2008, 2014   Benign   PONV (postoperative nausea and vomiting)    Primary osteoarthritis of right knee 06/22/2015   Senile purpura 10/12/2022   Shingles 2008   Squamous cell skin cancer, face 03/22/2012   Actively seeing dermatology.   Tendonitis    rt hand   Venous stasis of both lower extremities 08/14/2018   Vocal cord polyp    Past Surgical History:  Procedure Laterality Date  ABDOMINAL HYSTERECTOMY  2005   APPENDECTOMY     BASAL CELL CARCINOMA EXCISION     biospy  10/28/2009   Shave Biospy skin Left hand(Acitinic Keratoses), Right Upper Arm (superficial Basal Cell), Right Upper Back(Superficial Basal Cell), Left Neck ( Solar Lentigo & Seborrheic Keratoses)   BREAST BIOPSY  02/11/2011   Right Breast Needle Core Biospy - Upper Outer Quadrant; ER/PR 100%, Her-2 Neu neg.; Ki-67 10%   BREAST LUMPECTOMY WITH RADIOACTIVE SEED LOCALIZATION Right 06/12/2018   Procedure: RIGHT BREAST LUMPECTOMY WITH RADIOACTIVE SEED LOCALIZATION;  Surgeon: Ebbie Cough, MD;  Location: [facility];  Service: General;  Laterality: Right;   BREAST MAMMOSITE  03/14/2011   Procedure: MAMMOSITE BREAST;   Surgeon: Cough Ebbie, MD;  Location: [facility];  Service: General;  Laterality: Right;   BREAST SURGERY     right breast lumpectomy snbx   CHOLECYSTECTOMY  2004   COLONOSCOPY     COLONOSCOPY W/ POLYPECTOMY     2008, 2014 (benign)   COLONOSCOPY WITH PROPOFOL  N/A 07/09/2015   Procedure: COLONOSCOPY WITH PROPOFOL ;  Surgeon: Lesta JULIANNA Fitz, MD;  Location: [facility] ENDOSCOPY;  Service: Endoscopy;  Laterality: N/A;   DILATION AND CURETTAGE OF UTERUS N/A 04/24/2013   Procedure: DILATATION AND CURETTAGE;  Surgeon: Burnard VEAR Pate, MD;  Location: [facility];  Service: Gynecology;  Laterality: N/A;   ESOPHAGOGASTRODUODENOSCOPY N/A 07/06/2015   Procedure: ESOPHAGOGASTRODUODENOSCOPY (EGD);  Surgeon: Jerrell Sol, MD;  Location: [facility] END[facility]COPY;  Service: Endoscopy;  Laterality: N/A;   HYSTEROSCOPY N/A 04/24/2013   Procedure: HYSTEROSCOPY;  Surgeon: Burnard VEAR Pate, MD;  Location: [facility];  Service: Gynecology;  Laterality: N/A;   ROBOTIC ASSISTED TOTAL HYSTERECTOMY WITH BILATERAL SALPINGO OOPHERECTOMY Bilateral 06/11/2013   Procedure: ROBOTIC ASSISTED TOTAL HYSTERECTOMY WITH BILATERAL SALPINGO OOPHORECTOMY WITH POSSIBLE STAGING, EPISIOTOMY;  Surgeon: Sari Bachelor, MD;  Location: [facility];  Service: Gynecology;  Laterality: Bilateral;   SQUAMOUS CELL CARCINOMA EXCISION     TEAR DUCT PROBING     TONSILLECTOMY     vocal cord poylps  1969   excision   Patient Active Problem List   Diagnosis Date Noted   Urge incontinence 08/08/2023   Senile purpura 10/12/2022   Abnormal hemoglobin 08/25/2022   Lymphedema of both lower extremities 08/25/2022   OSA (obstructive sleep apnea) 04/28/2022   Genetic testing 09/16/2021   Low serum vitamin B12 08/26/2021   Mild vascular neurocognitive disorder 05/14/2021   Allergic rhinitis 05/26/2020   Chronic interstitial lung disease (HCC) 03/28/2019   Bilateral cold feet 03/28/2019   Chronic constipation 08/14/2018   Venous stasis of both lower extremities  08/14/2018   Abdominal aortic aneurysm (AAA) 3.0 cm to 5.0 cm in diameter in female 11/18/2016   Iron deficiency anemia, unspecified 07/06/2015   Primary osteoarthritis of right knee 06/22/2015   Osteoporosis 11/19/2014   Aneurysm of infrarenal abdominal aorta 11/19/2013   History of endometrial cancer 04/30/2013   Macular degeneration 09/13/2011   Malignant neoplasm of upper-outer quadrant of right breast in female, estrogen receptor positive 02/21/2011   Essential hypertension, benign 09/26/2007   History of CVA (cerebrovascular accident) 09/24/2007   Hyperlipidemia LDL goal <100 11/03/2005   GERD 11/03/2005    PCP: Alvan  REFERRING PROVIDER: Alvan  REFERRING DIAG: acute Rt sided low back pain without sciatica  Rationale for Evaluation and Treatment: Rehabilitation  THERAPY DIAG:  Other low back pain  Muscle weakness (generalized)  ONSET DATE: 11/06/23  SUBJECTIVE:  SUBJECTIVE STATEMENT:  Pt reports more soreness than pain in low back. She admits she stood on her feet for a long time wrapping gifts, not taking breaks.   PERTINENT HISTORY:  OA, osteoporosis Pt states that at the beginning of the month she was lifting suitcases during traveling and she hurt her lower back. She used biofreeze which helped a little bit but pain continues to persist. Pain increases with bending and with sit <> stand and with prolonged walking (shopping). Pain decreases with tylenol , use of heating pad and seated rest. She initially felt pain near her tailbone but now pain is in lower Rt back.  PAIN:  Are you having pain? Yes: NPRS scale: 5/10 currently sore , 6/10 at worst  Pain location: Rt lower back Pain description: ache, sore Aggravating factors: bending, sit <> stand Relieving factors: meds,  heat  PRECAUTIONS: Other: osteoporosis  RED FLAGS: None   WEIGHT BEARING RESTRICTIONS: No  FALLS:  Has patient fallen in last 6 months? No  LIVING ENVIRONMENT: Lives with: lives with their family Lives in: House/apartment Has following equipment at home: None  OCCUPATION: retired - lives with daughter  PLOF: Independent and Independent with basic ADLs  PATIENT GOALS: get rid of this pain  NEXT MD VISIT: PRN  OBJECTIVE:  Note: Objective measures were completed at Evaluation unless otherwise noted.  DIAGNOSTIC FINDINGS:  No recent imaging on file  PATIENT SURVEYS:  PSFS: THE PATIENT SPECIFIC FUNCTIONAL SCALE  Place score of 0-10 (0 = unable to perform activity and 10 = able to perform activity at the same level as before injury or problem)  Activity Date: 11/22/23 12/20/23   Sit to stand 4 10   2.pick up item from floor 3 10   3.     4.      Average Score 3.5 10     Total Score = Sum of activity scores/number of activities  Minimally Detectable Change: 3 points (for single activity); 2 points (for average score)  [facility] (nd). The Patient Specific Functional Scale . Retrieved from [email or web address]   COGNITION: Overall cognitive status: Within functional limits for tasks assessed       POSTURE: rounded shoulders, forward head, and flexed trunk   PALPATION: TTP Rt QL, Rt lumbar paraspinals, Rt glutes  LUMBAR ROM:   AROM eval  Flexion 90% pain end range  Extension 50%  Right lateral flexion WFL  Left lateral flexion WFL  Right rotation 70%  Left rotation 70%   (Blank rows = not tested)  LOWER EXTREMITY MMT:    MMT Right eval Left eval Right  Left   Hip flexion 4- pain 4- 4 4  Hip extension 3+ 3+ 4+ 4+  Hip abduction 3+ 3+ 4+ 4+  Hip adduction      Hip internal rotation      Hip external rotation   5 5  Knee flexion      Knee extension      Ankle dorsiflexion       Ankle plantarflexion      Ankle inversion      Ankle eversion       (Blank rows = not tested)   FUNCTIONAL TESTS:  30 seconds chair stand test = 4  GAIT: Distance walked: 100' Assistive device utilized: None Level of assistance: Complete Independence Comments: decreased cadence, forward flexed trunk  [facility] Adult PT Treatment:  DATE: 01/04/24 Therapeutic Exercise: HEP review patient education on importance of daily strengthening for optimal results Manual Therapy: STM to right QL and paraspinals Neuromuscular re-ed: Pelvic tilt 2x10  90/90 abdominal bracing x10 90/90 alternating legs x5- challenging  Seated ball press for core engagement 2x10  Therapeutic Activity: Hip hinging x10 in prep for sit to stands + core engagement  Sit to stands x3   [facility] Adult PT Treatment:                                                DATE: 12/20/23 Manual Therapy: STM to right paraspinals in lower back region, right QL, posterior rib cage Neuromuscular re-ed: Hip hinging x10 - to promote good mechanics for sit to stand transfer  Half dead lift picking up 1# weight from 6 in step + verbal cue to press heels into floor for posterior chain activation 2x10 Posterior pelvic tilt seated x10 Therapeutic Activity: Standing hip abd side kicks x10 each side - verbal cues keep leg within frontal plane (tendency to go into sagittal plane) Reviewed HEP  [facility] Adult PT Treatment:                                                DATE: 12/13/23  Manual Therapy: STM to right paraspinals, QL, Superior glute region -tissue restriction throughout  Neuromuscular re-ed: Hip hinging x10 - to promote good mechanics for sit to stand transfer  Posterior pelvic tilt seated x10 Hip ER with green band 2x10 Seated marches green band x40 + core engagement  Adductor squeeze with ball 2x10 Self Care: Discussion regarding pt alleviated symptoms after STM from last session and  plan to continue strengthening at home     [facility] Adult PT Treatment:                                                DATE: 12/06/23 Therapeutic Exercise: MMT reassessment see above  Hamstring stretch 30 sec each side  Manual Therapy: STM to right lumbar paraspinals and QL Neuromuscular re-ed: Posterior pelvic tilt 2x10 supine Dead bug UE only 2x10  Heel drag with stability ball + pelvic tilt Hip hinging in seated using dowel 2x10  Therapeutic Activity: Hip hinge + picking up ball from 4 in step x10 maintaining proper posture        PATIENT EDUCATION:  Education details: See treatment  Person educated: Patient Education method: Explanation, Demonstration, Tactile cues, and Verbal cues  Education comprehension: verbalized understanding and returned demonstration  HOME EXERCISE PROGRAM: Access Code: [email or web address] URL: [email or web address] Date: 12/06/2023 Prepared by: Lavanda Cleverly  Exercises - Seated Figure 4 Piriformis Stretch  - 2 x daily - 7 x weekly - 1 sets - 3 reps - 20-30 seconds hold - Seated Thoracic Flexion and Rotation on Swiss Ball  - 1 x daily - 7 x weekly - 1 sets - 3 reps - 20-30 seconds hold - Seated Hip Adduction Isometrics with Ball  - 1 x daily - 7 x weekly - 1 sets - 10 reps - 3 seconds hold - Seated Hip Abduction with Resistance  -  1 x daily - 7 x weekly - 2 sets - 8 reps - Standing Hip Extension with Counter Support  - 1 x daily - 7 x weekly - 2 sets - 8 reps - Supine Posterior Pelvic Tilt  - 1 x daily - 7 x weekly - 3 sets - 10 reps - Supine 90/90 Abdominal Bracing  - 1 x daily - 7 x weekly - 3 sets - 10 reps - Supine 90/90 Alternating Heel Touches with Posterior Pelvic Tilt  - 1 x daily - 7 x weekly - 3 sets - 10 reps - Seated Hip Hinge with Dowel  - 1 x daily - 7 x weekly - 3 sets - 10 reps  ASSESSMENT:  CLINICAL IMPRESSION: Performed exercises from pt's initial HEP, monitoring appropriate body mechanics and neuromuscular re ed. Tissue  restriction found in right QL muscle and paraspinals. Pt self reports inconsistentcy in performing her HEP, and will stand for long periods of time, forgetting to take breaks, causing back pain next day. Pt education on importance of completing HEP for optimal results, discussed activity modification to prevent flare ups, and discussed alternative options for soft tissue work on low back. Pt verbally stated understanding, states she's taking her HEP more seriously this week. Challenged with 90/90 exercise in getting into the position while contracting the core, requiring several verbal cues. Trialed sit to stands without UE support, unable to complete without using both hands against table. Will continue to practice sit to stands transfers.     GOALS: Goals reviewed with patient? Yes  SHORT TERM GOALS: Target date: 12/20/2023    Pt will be independent in initial HEP Baseline: Goal status: MET on 12/20/23  2.  Pt will improve LE strength to 4/5 to improve standing and walking tolerance Baseline:  Goal status: MET 12/06/23    LONG TERM GOALS: Target date: 01/17/2024    Pt will be independent with advanced HEP Baseline:  Goal status: INITIAL  2.  Pt will improve average PSFS to >= 6.5 to demo improved functional mobility Baseline: 3.5 Goal status: MET on 12/20/23 with 10  3.  Pt will tolerate bending to pick up items off of the floor with pain <= 1/10 Baseline: 6/10 Goal status: MET on 12/20/23  4.  Pt will improve 30 second chair rise test to >= 6 to demo improved LE strength and endurance Baseline:  Goal status: INITIAL   PLAN:  PT FREQUENCY: 2x/week  PT DURATION: 8 weeks  PLANNED INTERVENTIONS: 97164- PT Re-evaluation, 97110-Therapeutic exercises, 97530- Therapeutic activity, 97112- Neuromuscular re-education, 97535- Self Care, [id number]- Manual therapy, [id number]- Gait training, [phone]- Aquatic Therapy, [phone]- Electrical stimulation (unattended), [phone] (1-2 muscles), [phone] (3+  muscles)- Dry Needling, Patient/Family education, Balance training, Stair training, Taping, Cryotherapy, and Moist heat.  PLAN FOR NEXT SESSION: update HEP PRN, manual as indicated, core strength, LAQ, standing leg extension, STM to right QL, hip hinge to practice picking object off floor in seated, transition into standing, sit to stands in general  [place], SPT 01/04/2024, 4:43 PM   Lamarr GORMAN Price, PTA 01/04/2024, 4:40 PM

## 2024-01-10 ENCOUNTER — Encounter: Payer: Self-pay | Admitting: Physical Therapy

## 2024-01-10 ENCOUNTER — Ambulatory Visit: Admitting: Physical Therapy

## 2024-01-10 DIAGNOSIS — M5459 Other low back pain: Secondary | ICD-10-CM | POA: Diagnosis not present

## 2024-01-10 DIAGNOSIS — M6281 Muscle weakness (generalized): Secondary | ICD-10-CM

## 2024-01-10 NOTE — Therapy (Addendum)
 " OUTPATIENT PHYSICAL THERAPY THORACOLUMBAR TREATMENT AND DISCHARGE   Patient Name: Michelle [name] MRN: [id number] DOB:[birth year], 89 y.o., female Today's Date: 01/10/2024  END OF SESSION:  PT End of Session - 01/10/24 1407     Visit Number 9    Number of Visits 16    Date for Recertification  01/17/24    Authorization Type [employer] Medicare    Authorization - Visit Number 8    Progress Note Due on Visit 10    PT Start Time 1408   pt arrived late   PT Stop Time 1446    PT Time Calculation (min) 38 min    Activity Tolerance Patient tolerated treatment well    Behavior During Therapy [facility] for tasks assessed/performed               Past Medical History:  Diagnosis Date   Abdominal aortic aneurysm (AAA) 3.0 cm to 5.0 cm in diameter in female 11/18/2016   Due for repeat in 12/2019.   Abnormal hemoglobin 08/25/2022   Allergic rhinitis 05/26/2020   Aneurysm of infrarenal abdominal aorta 11/19/2013   2.6 cm in 2006 on CT.   CT 10/15 show 3 cm lesion.   US   2018 - no change  CT 2020 - 2.9 cm, stable      Arthritis    hips, lower back   Basal cell carcinoma 2011   R back   Bell's palsy 1984   Bilateral cold feet 03/28/2019   Blood transfusion without reported diagnosis 07/2015   Bradycardia    Cataract    Cholecystitis 2004   Chronic constipation 08/14/2018   Clotting disorder    Complication of anesthesia    COPD (chronic obstructive pulmonary disease)    Essential hypertension, benign 09/26/2007   Genetic testing 09/16/2021   Negative hereditary cancer genetic testing: no pathogenic variants detected in CancerNext-Expanded +RNAinsight Panel.  Report date is September 06, 2021.      The CancerNext-Expanded gene panel offered by [facility] and includes sequencing, rearrangement, and RNA analysis for the following 77 genes: AIP, ALK, APC, ATM, AXIN2, BAP1, BARD1, BLM, BMPR1A, BRCA1, BRCA2, BRIP1, CDC73, CDH1, CDK4, CDKN1B   GERD 11/03/2005   Hiatal hernia 2008   History of CVA  (cerebrovascular accident) 09/24/2007   left cerebellar infarction, PICA territory   History of endometrial cancer 04/30/2013   uterine   History of GI bleed 07/04/2015   Hyperlipidemia 11/03/2005   LDL goal <100   Interstitial lung disease 03/28/2019   Iron deficiency anemia, unspecified    Low serum vitamin B12 08/26/2021   Lymphedema of both lower extremities 08/25/2022   Macular degeneration    Malignant neoplasm of upper-outer quadrant of right breast in female, estrogen receptor positive 02/21/2011   Mild vascular neurocognitive disorder 05/14/2021   OSA (obstructive sleep apnea) 04/28/2022   04/2022: HST, AHI 61.9/hr, no central apneas.  Set to 8 cm water pressure based on AutoPap results.     Osteoporosis 11/19/2014   DEXA 10/27/14.  Repeat in 2 yr.  High risk to start medication.    Polyp of colon 2008, 2014   Benign   PONV (postoperative nausea and vomiting)    Primary osteoarthritis of right knee 06/22/2015   Senile purpura 10/12/2022   Shingles 2008   Squamous cell skin cancer, face 03/22/2012   Actively seeing dermatology.   Tendonitis    rt hand   Venous stasis of both lower extremities 08/14/2018   Vocal cord polyp  Past Surgical History:  Procedure Laterality Date   ABDOMINAL HYSTERECTOMY  2005   APPENDECTOMY     BASAL CELL CARCINOMA EXCISION     biospy  10/28/2009   Shave Biospy skin Left hand(Acitinic Keratoses), Right Upper Arm (superficial Basal Cell), Right Upper Back(Superficial Basal Cell), Left Neck ( Solar Lentigo & Seborrheic Keratoses)   BREAST BIOPSY  02/11/2011   Right Breast Needle Core Biospy - Upper Outer Quadrant; ER/PR 100%, Her-2 Neu neg.; Ki-67 10%   BREAST LUMPECTOMY WITH RADIOACTIVE SEED LOCALIZATION Right 06/12/2018   Procedure: RIGHT BREAST LUMPECTOMY WITH RADIOACTIVE SEED LOCALIZATION;  Surgeon: Ebbie Cough, MD;  Location: [facility];  Service: General;  Laterality: Right;   BREAST MAMMOSITE  03/14/2011    Procedure: MAMMOSITE BREAST;  Surgeon: Cough Ebbie, MD;  Location: [facility];  Service: General;  Laterality: Right;   BREAST SURGERY     right breast lumpectomy snbx   CHOLECYSTECTOMY  2004   COLONOSCOPY     COLONOSCOPY W/ POLYPECTOMY     2008, 2014 (benign)   COLONOSCOPY WITH PROPOFOL  N/A 07/09/2015   Procedure: COLONOSCOPY WITH PROPOFOL ;  Surgeon: Lesta JULIANNA Fitz, MD;  Location: [facility];  Service: Endoscopy;  Laterality: N/A;   DILATION AND CURETTAGE OF UTERUS N/A 04/24/2013   Procedure: DILATATION AND CURETTAGE;  Surgeon: Burnard VEAR Pate, MD;  Location: [facility];  Service: Gynecology;  Laterality: N/A;   ESOPHAGOGASTRODUODENOSCOPY N/A 07/06/2015   Procedure: ESOPHAGOGASTRODUODENOSCOPY (EGD);  Surgeon: Jerrell Sol, MD;  Location: [facility];  Service: Endoscopy;  Laterality: N/A;   HYSTEROSCOPY N/A 04/24/2013   Procedure: HYSTEROSCOPY;  Surgeon: Burnard VEAR Pate, MD;  Location: [facility];  Service: Gynecology;  Laterality: N/A;   ROBOTIC ASSISTED TOTAL HYSTERECTOMY WITH BILATERAL SALPINGO OOPHERECTOMY Bilateral 06/11/2013   Procedure: ROBOTIC ASSISTED TOTAL HYSTERECTOMY WITH BILATERAL SALPINGO OOPHORECTOMY WITH POSSIBLE STAGING, EPISIOTOMY;  Surgeon: Sari Bachelor, MD;  Location: [facility];  Service: Gynecology;  Laterality: Bilateral;   SQUAMOUS CELL CARCINOMA EXCISION     TEAR DUCT PROBING     TONSILLECTOMY     vocal cord poylps  1969   excision   Patient Active Problem List   Diagnosis Date Noted   Urge incontinence 08/08/2023   Senile purpura 10/12/2022   Abnormal hemoglobin 08/25/2022   Lymphedema of both lower extremities 08/25/2022   OSA (obstructive sleep apnea) 04/28/2022   Genetic testing 09/16/2021   Low serum vitamin B12 08/26/2021   Mild vascular neurocognitive disorder 05/14/2021   Allergic rhinitis 05/26/2020   Chronic interstitial lung disease (HCC) 03/28/2019   Bilateral cold feet 03/28/2019   Chronic constipation 08/14/2018   Venous  stasis of both lower extremities 08/14/2018   Abdominal aortic aneurysm (AAA) 3.0 cm to 5.0 cm in diameter in female 11/18/2016   Iron deficiency anemia, unspecified 07/06/2015   Primary osteoarthritis of right knee 06/22/2015   Osteoporosis 11/19/2014   Aneurysm of infrarenal abdominal aorta 11/19/2013   History of endometrial cancer 04/30/2013   Macular degeneration 09/13/2011   Malignant neoplasm of upper-outer quadrant of right breast in female, estrogen receptor positive 02/21/2011   Essential hypertension, benign 09/26/2007   History of CVA (cerebrovascular accident) 09/24/2007   Hyperlipidemia LDL goal <100 11/03/2005   GERD 11/03/2005    PCP: Alvan  REFERRING PROVIDER: Alvan  REFERRING DIAG: acute Rt sided low back pain without sciatica  Rationale for Evaluation and Treatment: Rehabilitation  THERAPY DIAG:  Muscle weakness (generalized)  ONSET DATE: 11/06/23  SUBJECTIVE:  SUBJECTIVE STATEMENT:  Pt reports she wrapped many gifts the last week and it wasn't that great for her back. She states she wasn't able to do her HEP like she wanted due to that and an allergy problem she had to take care of. However reports no back pain today.   PERTINENT HISTORY:  OA, osteoporosis Pt states that at the beginning of the month she was lifting suitcases during traveling and she hurt her lower back. She used biofreeze which helped a little bit but pain continues to persist. Pain increases with bending and with sit <> stand and with prolonged walking (shopping). Pain decreases with tylenol , use of heating pad and seated rest. She initially felt pain near her tailbone but now pain is in lower Rt back.  PAIN:  Are you having pain? Yes: NPRS scale: 0/10 currently sore , 6/10 at worst  Pain location: Rt  lower back Pain description: ache, sore Aggravating factors: bending, sit <> stand Relieving factors: meds, heat  PRECAUTIONS: Other: osteoporosis  RED FLAGS: None   WEIGHT BEARING RESTRICTIONS: No  FALLS:  Has patient fallen in last 6 months? No  LIVING ENVIRONMENT: Lives with: lives with their family Lives in: House/apartment Has following equipment at home: None  OCCUPATION: retired - lives with daughter  PLOF: Independent and Independent with basic ADLs  PATIENT GOALS: get rid of this pain  NEXT MD VISIT: PRN  OBJECTIVE:  Note: Objective measures were completed at Evaluation unless otherwise noted.  DIAGNOSTIC FINDINGS:  No recent imaging on file  PATIENT SURVEYS:  PSFS: THE PATIENT SPECIFIC FUNCTIONAL SCALE  Place score of 0-10 (0 = unable to perform activity and 10 = able to perform activity at the same level as before injury or problem)  Activity Date: 11/22/23 12/20/23   Sit to stand 4 10   2.pick up item from floor 3 10   3.     4.      Average Score 3.5 10     Total Score = Sum of activity scores/number of activities  Minimally Detectable Change: 3 points (for single activity); 2 points (for average score)  [facility] (nd). The Patient Specific Functional Scale . Retrieved from [email or web address]   COGNITION: Overall cognitive status: Within functional limits for tasks assessed       POSTURE: rounded shoulders, forward head, and flexed trunk   PALPATION: TTP Rt QL, Rt lumbar paraspinals, Rt glutes  LUMBAR ROM:   AROM eval  Flexion 90% pain end range  Extension 50%  Right lateral flexion WFL  Left lateral flexion WFL  Right rotation 70%  Left rotation 70%   (Blank rows = not tested)  LOWER EXTREMITY MMT:    MMT Right eval Left eval Right  Left   Hip flexion 4- pain 4- 4 4  Hip extension 3+ 3+ 4+ 4+  Hip abduction 3+ 3+ 4+ 4+  Hip adduction      Hip internal  rotation      Hip external rotation   5 5  Knee flexion      Knee extension      Ankle dorsiflexion      Ankle plantarflexion      Ankle inversion      Ankle eversion       (Blank rows = not tested)   FUNCTIONAL TESTS:  30 seconds chair stand test = 4  GAIT: Distance walked: 100' Assistive device utilized: None Level of assistance: Complete Independence Comments: decreased cadence, forward flexed  trunk  [facility] Adult PT Treatment:                                                DATE: 01/10/24 Therapeutic Exercise: HEP review -pt education on activity modification, preventing flare up in right side Hip ER with green band 2x10  Standing abd side kicks x10 BIL  Seated marches x20   Neuromuscular re-ed: Pelvic tilts x10 Pelvic tilts + knees to chest alternating legs x5 Pelvic tilts + adductor ball squeeze x10  Arom shoulder retractions 2x10  Standing cone taps alternating legs with weight shifting x10    [facility] Adult PT Treatment:                                                DATE: 01/04/24 Therapeutic Exercise: HEP review patient education on importance of daily strengthening for optimal results Manual Therapy: STM to right QL and paraspinals Neuromuscular re-ed: Pelvic tilt 2x10  90/90 abdominal bracing x10 90/90 alternating legs x5- challenging  Seated ball press for core engagement 2x10  Therapeutic Activity: Hip hinging x10 in prep for sit to stands + core engagement  Sit to stands x3   [facility] Adult PT Treatment:                                                DATE: 12/20/23 Manual Therapy: STM to right paraspinals in lower back region, right QL, posterior rib cage Neuromuscular re-ed: Hip hinging x10 - to promote good mechanics for sit to stand transfer  Half dead lift picking up 1# weight from 6 in step + verbal cue to press heels into floor for posterior chain activation 2x10 Posterior pelvic tilt seated x10 Therapeutic Activity: Standing hip abd side kicks x10  each side - verbal cues keep leg within frontal plane (tendency to go into sagittal plane) Reviewed HEP     PATIENT EDUCATION:  Education details: See treatment  Person educated: Patient Education method: Explanation, Demonstration, Tactile cues, and Verbal cues  Education comprehension: verbalized understanding and returned demonstration  HOME EXERCISE PROGRAM: Access Code: [email or web address] URL: [email or web address] Date: 12/06/2023 Prepared by: Lavanda Cleverly  Exercises - Seated Figure 4 Piriformis Stretch  - 2 x daily - 7 x weekly - 1 sets - 3 reps - 20-30 seconds hold - Seated Thoracic Flexion and Rotation on Swiss Ball  - 1 x daily - 7 x weekly - 1 sets - 3 reps - 20-30 seconds hold - Seated Hip Adduction Isometrics with Ball  - 1 x daily - 7 x weekly - 1 sets - 10 reps - 3 seconds hold - Seated Hip Abduction with Resistance  - 1 x daily - 7 x weekly - 2 sets - 8 reps - Standing Hip Extension with Counter Support  - 1 x daily - 7 x weekly - 2 sets - 8 reps - Supine Posterior Pelvic Tilt  - 1 x daily - 7 x weekly - 3 sets - 10 reps - Supine 90/90 Abdominal Bracing  - 1 x daily - 7 x weekly - 3 sets - 10 reps -  Supine 90/90 Alternating Heel Touches with Posterior Pelvic Tilt  - 1 x daily - 7 x weekly - 3 sets - 10 reps - Seated Hip Hinge with Dowel  - 1 x daily - 7 x weekly - 3 sets - 10 reps  ASSESSMENT:  CLINICAL IMPRESSION: Introduced new standing exercises, that challenged coordination. Pt has difficulty completing HEP independently and also active recall with repeated exercises performed during session. She reports no back pain today, however stated mild low back discomfort after a few seated/supine exercises, thus moved to standing. Trialed new standing exercises well with maintaining balance, and with appropriate weight shifting.     GOALS: Goals reviewed with patient? Yes  SHORT TERM GOALS: Target date: 12/20/2023    Pt will be independent in initial  HEP Baseline: Goal status: MET on 12/20/23  2.  Pt will improve LE strength to 4/5 to improve standing and walking tolerance Baseline:  Goal status: MET 12/06/23    LONG TERM GOALS: Target date: 01/17/2024    Pt will be independent with advanced HEP Baseline:  Goal status: INITIAL  2.  Pt will improve average PSFS to >= 6.5 to demo improved functional mobility Baseline: 3.5 Goal status: MET on 12/20/23 with 10  3.  Pt will tolerate bending to pick up items off of the floor with pain <= 1/10 Baseline: 6/10 Goal status: MET on 12/20/23  4.  Pt will improve 30 second chair rise test to >= 6 to demo improved LE strength and endurance Baseline:  Goal status: INITIAL   PLAN:  PT FREQUENCY: 2x/week  PT DURATION: 8 weeks  PLANNED INTERVENTIONS: 97164- PT Re-evaluation, 97110-Therapeutic exercises, 97530- Therapeutic activity, 97112- Neuromuscular re-education, 97535- Self Care, [id number]- Manual therapy, [id number]- Gait training, [phone]- Aquatic Therapy, [phone]- Electrical stimulation (unattended), [phone] (1-2 muscles), 20561 (3+ muscles)- Dry Needling, Patient/Family education, Balance training, Stair training, Taping, Cryotherapy, and Moist heat.  PLAN FOR NEXT SESSION: update HEP PRN, manual as indicated, core strength, LAQ, standing leg extension, STM to right QL, hip hinge to practice picking object off floor in seated, transition into standing, sit to stands in general  Lavanda Cleverly, SPT 01/10/24 4:48 PM  PHYSICAL THERAPY DISCHARGE SUMMARY  Visits from Start of Care: 9  Current functional level related to goals / functional outcomes: Improving tolerance to exercise   Remaining deficits: See above   Education / Equipment: HEP   Patient agrees to discharge. Patient goals were partially met. Patient is being discharged due to not returning since the last visit.  Darice Conine, PT,DPT01/27/2610:28 AM    "

## 2024-01-14 NOTE — Progress Notes (Signed)
 Michelle [name] is a very pleasant 89 y.o. RH female with a history of hypertension, hyperlipidemia, AAA, osteoporosis, h/o CVA 2009 with residual memory loss, R breast Cancer on anastrozole  and h/o Endometrial Ca, Iron deficiency Anemia, COPD and a diagnosis of mild cognitive impairment likely of vascular etiology, with findings not suggestive of Alzheimer's disease at this time  per neuropsych evaluation April [id number] seen today in follow up for memory loss. Patient is currently on memantine  10 mg bid (bradycardia).  This patient is accompanied in the office by her daughter  who supplement  the history.  Previous records as well as any outside records available were reviewed prior to todays visit. Patient was last seen on 07/11/23. Memory is stable with MMSE today is  23/30. Patient is able to participate on ADLs  Mood is fair.     Mild Cognitive Impairment likely of vascular etiology   Continue Memantine  10 mg twice daily. Side effects were discussed Recommend increasing social interaction Recommend good control of cardiovascular risk factors.   Continue to control mood as per PCP  Follow COPD/OSA with pulmonary  Discussed the use of AI scribe software for clinical note transcription with the patient, who gave verbal consent to proceed.  History of Present Illness  Michelle [name] is an 89 year old female with mild cognitive impairment who presents for a follow-up  She is accompanied by her daughter, Michelle [name].  Her memory remains stable compared to six months ago. She uses calendars and word searches to aid her memory. Her daughter notes occasional difficulty in recalling past events or recent conversations. No changes in decision-making or orientation. Occasional repetition noted by her daughter. No wandering tendencies, personality changes, or hallucinations. No changes in mood, worsening depression, hallucinations, paranoia, nightmares .  No recent injuries, falls, major headaches, tremors,  stroke-like symptoms     She uses a CPAP machine for sleep apnea, which she finds uncomfortable. No recent head injuries, falls, or hospitalizations for severe headaches or tremors. Appetite is good, but she limits water intake due to urinary incontinence. Drinks tea and occasionally eats ice chips. Independent in daily activities, including managing medications and finances. No recent scams or financial issues.   No recent strokes or seizures. Maintains a diet with proper food, fruits, and vegetables.Drinks prune juice daily to manage bowel movements.  No longer drives     Neurocognitive testing 05/18/23 Dr. Richie Briefly, results suggested an isolated impairment surrounding semantic fluency. Further performance variability was exhibited across confrontation naming and delayed retrieval aspects of memory. While she exhibited an isolated weakness across a line orientation task, all other visuospatial tasks were appropriate. Relative to her previous evaluation in April 2023, her sharpest decline surrounded significant trouble recalling a previously drawn complex figure after a brief delay. More mild declines were exhibited across attention/concentration, semantic fluency, and encoding aspects of memory. However, in several of these cases, current performances still remained normatively appropriate. Variability was exhibited across confrontation naming where one task suggested mild decline and another suggested mild improvement. Other assessed domains exhibited stability over time. Regarding etiology, I continue to favor a primary vascular cause (i.e., mild vascular neurocognitive disorder) at the present time. Prior neuroimaging has suggested moderate to extensive cerebrovascular disease that has progressed over time and a remote left cerebellar infarct. As this scan was performed in 2022, there remains the potential that further progression has occurred over the past several years which could account for some  decline across testing. Specific  to memory, delayed retrieval rates were 0% and 10% across list and figure-based tasks respectively. However, she retained 70% of previously learned story-based information after a delay and performances were normatively appropriate across recognition trials across all three memory tasks. Overall, while some concern for rapid forgetting is reasonable, this is not a consistent finding and she does not exhibit any memory storage abnormalities presently. As such, she does not exhibit a classic testing pattern suggestive of underlying Alzheimer's disease. While the very early stages of this illness cannot be ruled out, its presence cannot be ruled in with any degree of confidence based upon current testing patterns.   Initial evaluation 08/27/2020 the patient is seen in neurologic consultation at the request of Michelle [name], * for the evaluation of memory.  The patient is accompanied by daughter Michelle [name] who supplements the history.  Her other daughter Michelle [name] is on the phone with us .  The patient is an 89 y.o. year old female who has had memory issues since her stroke in 2009.  However, the symptoms became more noticeable after a day sudden death of her husband to massive heart attack in December 2021.  This caused significant emotional stress on the patient and she reports now I am fine mainly coming out of it, but it was a rough 6 or 7 months .  At that time, she could not sleep more than 3 hours a night, and was losing the confidence of being on my own, increasingly anxious, and having less patience .  She moved a couple of months ago to be with her daughter, and now she is beginning to increase her activity, and going out of the house more often, although she feels that the memory loss is still there . She has been seen by her PCP, who suspects mild cognitive impairment.  She is in no medications at this time.  She has never been seen by a neurologist until now. Her daughter  reports that she repeats the same questions, also tells the same stories for at least a year. Denies hallucinations or paranoia.  She denies acting out in her dreams, or sleepwalking.  Denies issues with bathing or dressing.  Denies missing any medications.  She does her own finances and denies missing any meals.  She denies leaving objects in unusual places.  Her appetite is good, denies trouble swallowing.  She cooks, and denies leaving the stove or the faucet on.  She used to drive until recently, but the daughter took the keys of the car because she is afraid I am going to get into an accident  Michelle [name] states that there was one episode where she did not recall how to get to the hair salon despite having known this place for a long time, in which the patient interjects well I just moved to a different place and I am trying to learn my way around, and the roads changed .  She ambulates without difficulty without a walker or a cane, and denies any recent falls, or trauma to the head. She denies any double vision, dizziness, focal numbness or tingling, unilateral weakness or tremors.  She denies anosmia.  Denies a history of sleep apnea, alcohol.  She quit in 1998 the use of tobacco.  Family history remarkable for maternal grandfather with dementia. She has 12th grade education.   MRI of the brain with and without contrast 06/16/2020 for memory loss   1. No reversible finding or evidence of metastatic disease. 2. Moderate to  extensive chronic small vessel ischemia with notable progression from 2009. 3. Mild cerebral volume loss since prior without specific pattern.  MRI brain6/2025, personally reviewed, remarkable for similar findings since 2022, with moderate to extensive chronic SVD, mild to moderate cerebral atrophy, unchanged chronic lacunar infarct within the L periatrial white matte            01/10/2023    6:00 PM 07/28/2022    2:00 PM 11/26/2019    2:47 PM  MMSE - Mini Mental State Exam   Orientation to time 5 5 4   Orientation to Place 5 5 5   Registration 3 3 3   Attention/ Calculation 5 5 5   Recall 3 3 3   Language- name 2 objects 2 2 2   Language- repeat 1 1 1   Language- follow 3 step command 3 3 3   Language- read & follow direction 1 1 1   Write a sentence 1 1 1   Copy design 1 1 1   Total score 30 30 29       08/08/2023    2:32 PM 08/27/2020   10:00 AM  Montreal Cognitive Assessment   Visuospatial/ Executive (0/5) 5 4  Naming (0/3) 2 3  Attention: Read list of digits (0/2) 1 2  Attention: Read list of letters (0/1) 1 1  Attention: Serial 7 subtraction starting at 100 (0/3) 2 2  Language: Repeat phrase (0/2) 2 2  Language : Fluency (0/1) 1 0  Abstraction (0/2) 1 2  Delayed Recall (0/5) 1 1  Orientation (0/6) 6 6  Total 22 23  Adjusted Score (based on education) 23 24      Objective:    Neurological Exam:    VITALS:   Vitals:   01/15/24 1449  BP: (!) 149/82  Pulse: 75  SpO2: 95%  Weight: 187 lb (84.8 kg)  Height: 5' 7 (1.702 m)    GEN:  The patient appears stated age and is in NAD. HEENT:  Normocephalic, atraumatic.   Neurological examination:  General: NAD, well-groomed, appears stated age. Orientation: The patient is alert. Oriented to person, place and date Cranial nerves: There is good facial symmetry.The speech is fluent and clear. No aphasia or dysarthria. Fund of knowledge is appropriate. Recent  memory impaired, remote memory normal. Attention and concentration are normal. Able to name objects and repeat phrases.  Hearing is intact to conversational tone.  Sensation: Sensation is intact to light touch throughout Motor: Strength is at least antigravity x4. DTR's 2/4 in UE/LE     Movement examination:  Tone: There is normal tone in the UE/LE Abnormal movements:  no tremor.  No myoclonus.  No asterixis.   Coordination:  There is no decremation with RAM's. Normal finger to nose  Gait and Station: The patient has no difficulty arising  out of a deep-seated chair without the use of the hands. The patient's stride length is good.  Gait is cautious and narrow.    Thank you for allowing us  the opportunity to participate in the care of this nice patient. Please do not hesitate to contact us  for any questions or concerns.   Total time spent on today's visit was  30 minutes dedicated to this patient today, preparing to see patient, examining the patient, ordering tests and/or medications and counseling the patient, documenting clinical information in the EHR or other health record, independently interpreting results and communicating results to the patient/family, discussing treatment and goals, answering patient's questions and coordinating care.  Cc:  Michelle Dorothyann BIRCH, MD  Camie Sevin  01/15/2024 8:32 PM

## 2024-01-15 ENCOUNTER — Ambulatory Visit: Admitting: Physician Assistant

## 2024-01-15 ENCOUNTER — Encounter: Payer: Self-pay | Admitting: Physician Assistant

## 2024-01-15 VITALS — BP 149/82 | HR 75 | Ht 67.0 in | Wt 187.0 lb

## 2024-01-15 DIAGNOSIS — I999 Unspecified disorder of circulatory system: Secondary | ICD-10-CM | POA: Diagnosis not present

## 2024-01-15 DIAGNOSIS — F0670 Mild neurocognitive disorder due to known physiological condition without behavioral disturbance: Secondary | ICD-10-CM

## 2024-01-15 NOTE — Patient Instructions (Signed)
 It was a pleasure to see you today at our office.   Recommendations:  Follow up in 6 months   Continue memantine  10 mg mg two times a day   [facility]  RECOMMENDATIONS FOR ALL PATIENTS WITH MEMORY PROBLEMS:  1. Continue to exercise (Recommend 30 minutes of walking everyday, or 3 hours every week) 2. Increase social interactions - continue going to [place] and enjoy social gatherings with friends and family 3. Eat healthy, avoid fried foods and eat more fruits and vegetables 4. Maintain adequate blood pressure, blood sugar, and blood cholesterol level. Reducing the risk of stroke and cardiovascular disease also helps promoting better memory. 5. Avoid stressful situations. Live a simple life and avoid aggravations. Organize your time and prepare for the next day in anticipation. 6. Sleep well, avoid any interruptions of sleep and avoid any distractions in the bedroom that may interfere with adequate sleep quality 7. Avoid sugar, avoid sweets as there is a strong link between excessive sugar intake, diabetes, and cognitive impairment We discussed the Mediterranean diet, which has been shown to help patients reduce the risk of progressive memory disorders and reduces cardiovascular risk. This includes eating fish, eat fruits and green leafy vegetables, nuts like almonds and hazelnuts, walnuts, and also use olive oil. Avoid fast foods and fried foods as much as possible. Avoid sweets and sugar as sugar use has been linked to worsening of memory function.  There is always a concern of gradual progression of memory problems. If this is the case, then we may need to adjust level of care according to patient needs. Support, both to the patient and caregiver, should then be put into place.    FALL PRECAUTIONS: Be cautious when walking. Scan the area for obstacles that may increase the risk of trips and falls. When getting up in the mornings, sit up at the edge of the bed for a few minutes  before getting out of bed. Consider elevating the bed at the head end to avoid drop of blood pressure when getting up. Walk always in a well-lit room (use night lights in the walls). Avoid area rugs or power cords from appliances in the middle of the walkways. Use a walker or a cane if necessary and consider physical therapy for balance exercise. Get your eyesight checked regularly.  FINANCIAL OVERSIGHT: Supervision, especially oversight when making financial decisions or transactions is also recommended.  HOME SAFETY: Consider the safety of the kitchen when operating appliances like stoves, microwave oven, and blender. Consider having supervision and share cooking responsibilities until no longer able to participate in those. Accidents with firearms and other hazards in the house should be identified and addressed as well.   ABILITY TO BE LEFT ALONE: If patient is unable to contact 911 operator, consider using LifeLine, or when the need is there, arrange for someone to stay with patients. Smoking is a fire hazard, consider supervision or cessation. Risk of wandering should be assessed by caregiver and if detected at any point, supervision and safe proof recommendations should be instituted.  MEDICATION SUPERVISION: Inability to self-administer medication needs to be constantly addressed. Implement a mechanism to ensure safe administration of the medications.       Mediterranean Diet A Mediterranean diet refers to food and lifestyle choices that are based on the traditions of countries located on the [place]. This way of eating has been shown to help prevent certain conditions and improve outcomes for people who have chronic diseases, like kidney disease  and heart disease. What are tips for following this plan? Lifestyle  Cook and eat meals together with your family, when possible. Drink enough fluid to keep your urine clear or pale yellow. Be physically active every day. This  includes: Aerobic exercise like running or swimming. Leisure activities like gardening, walking, or housework. Get 7-8 hours of sleep each night. If recommended by your health care provider, drink red wine in moderation. This means 1 glass a day for nonpregnant women and 2 glasses a day for men. A glass of wine equals 5 oz (150 mL). Reading food labels  Check the serving size of packaged foods. For foods such as rice and pasta, the serving size refers to the amount of cooked product, not dry. Check the total fat in packaged foods. Avoid foods that have saturated fat or trans fats. Check the ingredients list for added sugars, such as corn syrup. Shopping  At the grocery store, buy most of your food from the areas near the walls of the store. This includes: Fresh fruits and vegetables (produce). Grains, beans, nuts, and seeds. Some of these may be available in unpackaged forms or large amounts (in bulk). Fresh seafood. Poultry and eggs. Low-fat dairy products. Buy whole ingredients instead of prepackaged foods. Buy fresh fruits and vegetables in-season from local farmers markets. Buy frozen fruits and vegetables in resealable bags. If you do not have access to quality fresh seafood, buy precooked frozen shrimp or canned fish, such as tuna, salmon, or sardines. Buy small amounts of raw or cooked vegetables, salads, or olives from the deli or salad bar at your store. Stock your pantry so you always have certain foods on hand, such as olive oil, canned tuna, canned tomatoes, rice, pasta, and beans. Cooking  Cook foods with extra-virgin olive oil instead of using butter or other vegetable oils. Have meat as a side dish, and have vegetables or grains as your main dish. This means having meat in small portions or adding small amounts of meat to foods like pasta or stew. Use beans or vegetables instead of meat in common dishes like chili or lasagna. Experiment with different cooking methods. Try  roasting or broiling vegetables instead of steaming or sauteing them. Add frozen vegetables to soups, stews, pasta, or rice. Add nuts or seeds for added healthy fat at each meal. You can add these to yogurt, salads, or vegetable dishes. Marinate fish or vegetables using olive oil, lemon juice, garlic, and fresh herbs. Meal planning  Plan to eat 1 vegetarian meal one day each week. Try to work up to 2 vegetarian meals, if possible. Eat seafood 2 or more times a week. Have healthy snacks readily available, such as: Vegetable sticks with hummus. Greek yogurt. Fruit and nut trail mix. Eat balanced meals throughout the week. This includes: Fruit: 2-3 servings a day Vegetables: 4-5 servings a day Low-fat dairy: 2 servings a day Fish, poultry, or lean meat: 1 serving a day Beans and legumes: 2 or more servings a week Nuts and seeds: 1-2 servings a day Whole grains: 6-8 servings a day Extra-virgin olive oil: 3-4 servings a day Limit red meat and sweets to only a few servings a month What are my food choices? Mediterranean diet Recommended Grains: Whole-grain pasta. Brown rice. Bulgar wheat. Polenta. Couscous. Whole-wheat bread. [place] Made[place]. Vegetables: Artichokes. Beets. Broccoli. Cabbage. Carrots. Eggplant. Green beans. Chard. Kale. Spinach. Onions. Leeks. Peas. Squash. Tomatoes. Peppers. Radishes. Fruits: Apples. Apricots. Avocado. Berries. Bananas. Cherries. Dates. Figs. Grapes. Lemons. Melon. Oranges.  Peaches. Plums. Pomegranate. Meats and other protein foods: Beans. Almonds. Sunflower seeds. Pine nuts. Peanuts. Cod. Salmon. Scallops. Shrimp. Tuna. Tilapia. Clams. Oysters. Eggs. Dairy: Low-fat milk. Cheese. Greek yogurt. Beverages: Water. Red wine. Herbal tea. Fats and oils: Extra virgin olive oil. Avocado oil. Grape seed oil. Sweets and desserts: Greek yogurt with honey. Baked apples. Poached pears. Trail mix. Seasoning and other foods: Basil. Cilantro. Coriander. Cumin. Mint.  Parsley. Sage. Rosemary. Tarragon. Garlic. Oregano. Thyme. Pepper. Balsalmic vinegar. Tahini. Hummus. Tomato sauce. Olives. Mushrooms. Limit these Grains: Prepackaged pasta or rice dishes. Prepackaged cereal with added sugar. Vegetables: Deep fried potatoes (french fries). Fruits: Fruit canned in syrup. Meats and other protein foods: Beef. Pork. Lamb. Poultry with skin. Hot dogs. [place]. Dairy: Ice cream. Sour cream. Whole milk. Beverages: Juice. Sugar-sweetened soft drinks. Beer. Liquor and spirits. Fats and oils: Butter. Canola oil. Vegetable oil. Beef fat (tallow). Lard. Sweets and desserts: Cookies. Cakes. Pies. Candy. Seasoning and other foods: Mayonnaise. Premade sauces and marinades. The items listed may not be a complete list. Talk with your dietitian about what dietary choices are right for you. Summary The Mediterranean diet includes both food and lifestyle choices. Eat a variety of fresh fruits and vegetables, beans, nuts, seeds, and whole grains. Limit the amount of red meat and sweets that you eat. Talk with your health care provider about whether it is safe for you to drink red wine in moderation. This means 1 glass a day for nonpregnant women and 2 glasses a day for men. A glass of wine equals 5 oz (150 mL). This information is not intended to replace advice given to you by your health care provider. Make sure you discuss any questions you have with your health care provider. Document Released: 09/10/2015 Document Revised: 10/13/2015 Document Reviewed: 09/10/2015 Elsevier Interactive Patient Education  2017 [employer].    Your provider has requested that you have labwork completed today. Please go to [facility] ([id number]) on the second floor of this building before leaving the office today. You do not need to check in. If you are not called within 15 minutes please check with the front desk.

## 2024-02-08 ENCOUNTER — Ambulatory Visit: Payer: Medicare (Managed Care) | Admitting: Family Medicine

## 2024-02-08 VITALS — BP 140/70 | HR 81 | Ht 67.0 in | Wt 190.0 lb

## 2024-02-08 DIAGNOSIS — D509 Iron deficiency anemia, unspecified: Secondary | ICD-10-CM

## 2024-02-08 DIAGNOSIS — J849 Interstitial pulmonary disease, unspecified: Secondary | ICD-10-CM | POA: Diagnosis not present

## 2024-02-08 DIAGNOSIS — I10 Essential (primary) hypertension: Secondary | ICD-10-CM | POA: Diagnosis not present

## 2024-02-08 DIAGNOSIS — M818 Other osteoporosis without current pathological fracture: Secondary | ICD-10-CM | POA: Diagnosis not present

## 2024-02-08 DIAGNOSIS — E785 Hyperlipidemia, unspecified: Secondary | ICD-10-CM

## 2024-02-08 NOTE — Assessment & Plan Note (Signed)
 Essential hypertension No recent home blood pressure monitoring. - repeat BP looks much better today.

## 2024-02-08 NOTE — Assessment & Plan Note (Signed)
 Chronic interstitial lung disease Shortness of breath and fatigue noted. Albuterol  inhaler use and expiration discussed. - Check expiration date on albuterol  inhaler. - Encouraged use of albuterol  inhaler with spacer. - Send prescription for new inhaler if outdated.

## 2024-02-08 NOTE — Assessment & Plan Note (Signed)
 Will get next DEXA scheduled.

## 2024-02-08 NOTE — Assessment & Plan Note (Signed)
"  Due for updated lipid panel  "

## 2024-02-08 NOTE — Assessment & Plan Note (Signed)
 Due for iron levels.

## 2024-02-08 NOTE — Progress Notes (Signed)
 "  Established Patient Office Visit  Patient ID: Michelle [name], female    DOB: Jul 01, 1932  Age: 89 y.o. MRN: [id number] PCP: [name], MD  Chief Complaint  Patient presents with   Medical Management of Chronic Issues    Subjective:     HPI  Discussed the use of AI scribe software for clinical note transcription with the patient, who gave verbal consent to proceed.  History of Present Illness Michelle [name] is an 89 year old female who presents with concerns about lymphedema and medication interactions.  Lower extremity edema and lymphedema management - Persistent swelling in feet and legs attributed to lymphedema - Has not been wearing compression stockings - Not engaging in recommended exercises for lymphedema management  Medication interaction concerns - Concerned about potential interaction between honey in coffee and clopidogrel  - Worried about increased bruising and bleeding  Nocturnal foot discomfort - Experiences aching in feet, especially heels, at night - Applies alcohol to feet at night for relief - Acknowledges drying effect of alcohol on skin - Uses moisturizer to counteract dryness  Dyspnea and fatigue with activity - Feels out of breath and tired when preparing for church - Has not been using albuterol  inhaler, which has been prescribed for a long time  Gait instability and fall prevention - Reluctant to use cane despite neurologist recommendation - Concerned about balance, especially on uneven ground such as in a [place] - Looks down at feet to avoid falling  Absence of cardiovascular and neurological symptoms - No recent chest pain - No headaches - No unusual symptoms related to blood pressure     ROS    Objective:     BP (!) 140/70   Pulse 81   Ht 5' 7 (1.702 m)   Wt 190 lb (86.2 kg)   SpO2 99%   BMI 29.76 kg/m    Physical Exam Vitals and nursing note reviewed.  Constitutional:      Appearance: Normal appearance.   HENT:     Head: Normocephalic and atraumatic.  Eyes:     Conjunctiva/sclera: Conjunctivae normal.  Cardiovascular:     Rate and Rhythm: Normal rate and regular rhythm.  Pulmonary:     Effort: Pulmonary effort is normal.     Breath sounds: Normal breath sounds.  Skin:    General: Skin is warm and dry.  Neurological:     Mental Status: She is alert.  Psychiatric:        Mood and Affect: Mood normal.      Results for orders placed or performed in visit on 02/08/24  CMP14+EGFR  Result Value Ref Range   Glucose 97 70 - 99 mg/dL   BUN 10 8 - 27 mg/dL   Creatinine, Ser 9.09 0.57 - 1.00 mg/dL   eGFR 61 >40 fO/fpw/8.26   BUN/Creatinine Ratio 11 (L) 12 - 28   Sodium 137 134 - 144 mmol/L   Potassium 4.6 3.5 - 5.2 mmol/L   Chloride 101 96 - 106 mmol/L   CO2 23 20 - 29 mmol/L   Calcium  9.6 8.7 - 10.3 mg/dL   Total Protein 6.6 6.0 - 8.5 g/dL   Albumin 4.2 3.7 - 4.7 g/dL   Globulin, Total 2.4 1.5 - 4.5 g/dL   Bilirubin Total 0.3 0.0 - 1.2 mg/dL   Alkaline Phosphatase 109 48 - 129 IU/L   AST 20 0 - 40 IU/L   ALT 11 0 - 32 IU/L  Lipid panel  Result Value Ref Range  Cholesterol, Total 162 100 - 199 mg/dL   Triglycerides 885 0 - 149 mg/dL   HDL 64 >60 mg/dL   VLDL Cholesterol Cal 20 5 - 40 mg/dL   LDL Chol Calc (NIH) 78 0 - 99 mg/dL   Chol/HDL Ratio 2.5 0.0 - 4.4 ratio  CBC  Result Value Ref Range   WBC 8.3 3.4 - 10.8 x10E3/uL   RBC 3.83 3.77 - 5.28 x10E6/uL   Hemoglobin 12.7 11.1 - 15.9 g/dL   Hematocrit 60.3 65.9 - 46.6 %   MCV 103 (H) 79 - 97 fL   MCH 33.2 (H) 26.6 - 33.0 pg   MCHC 32.1 31.5 - 35.7 g/dL   RDW 88.8 (L) 88.2 - 84.5 %   Platelets 216 150 - 450 x10E3/uL  Fe+TIBC+Fer  Result Value Ref Range   Total Iron Binding Capacity 258 250 - 450 ug/dL   UIBC 838 881 - 630 ug/dL   Iron 97 27 - 860 ug/dL   Iron Saturation 38 15 - 55 %   Ferritin 51 15 - 150 ng/mL      The ASCVD Risk score (Arnett DK, et al., 2019) failed to calculate for the following reasons:    The 2019 ASCVD risk score is only valid for ages 50 to 56   Risk score cannot be calculated because patient has a medical history suggesting prior/existing ASCVD   * - Cholesterol units were assumed    Assessment & Plan:   Problem List Items Addressed This Visit       Cardiovascular and Mediastinum   Essential hypertension, benign   Essential hypertension No recent home blood pressure monitoring. - repeat BP looks much better today.        Relevant Orders   CMP14+EGFR (Completed)   Lipid panel (Completed)   CBC (Completed)     Respiratory   Chronic interstitial lung disease (HCC) - Primary   Chronic interstitial lung disease Shortness of breath and fatigue noted. Albuterol  inhaler use and expiration discussed. - Check expiration date on albuterol  inhaler. - Encouraged use of albuterol  inhaler with spacer. - Send prescription for new inhaler if outdated.      Relevant Orders   CMP14+EGFR (Completed)   Lipid panel (Completed)   CBC (Completed)     Musculoskeletal and Integument   Osteoporosis   Will get next DEXA scheduled.       Relevant Orders   DG Bone Density     Other   Iron deficiency anemia, unspecified   Due for iron levels.        Relevant Orders   CBC (Completed)   Fe+TIBC+Fer (Completed)   Hyperlipidemia LDL goal <100   Due for updated lipid panel.        Assessment and Plan Assessment & Plan Lymphedema of lower extremities Chronic lymphedema with ineffective diuretics. Compression therapy and manual lymphatic drainage discussed. - Consider referral to lymphedema clinic for manual lymphatic drainage and compression therapy. - Encouraged use of compression stockings.  Balance disturbance and abnormal gait Balance disturbance with abnormal gait. Neurology recommended cane use. - Encouraged use of a cane or rolling walker. - Offered referral to physical therapy for cane use training.  General health maintenance Due for blood work and bone  density scan. - Ordered blood work. - Ordered bone density scan.  I spent 40 minutes on the day of the encounter to include pre-visit record review, face-to-face time with the patient and post visit ordering of test.   Return in about  6 months (around 08/07/2024) for Medications and labs .    Dorothyann Byars, MD [facility] at [facility]   "

## 2024-02-09 ENCOUNTER — Telehealth: Payer: Self-pay | Admitting: Family Medicine

## 2024-02-09 ENCOUNTER — Ambulatory Visit: Payer: Self-pay | Admitting: Family Medicine

## 2024-02-09 LAB — CBC
Hematocrit: 39.6 % (ref 34.0–46.6)
Hemoglobin: 12.7 g/dL (ref 11.1–15.9)
MCH: 33.2 pg — ABNORMAL HIGH (ref 26.6–33.0)
MCHC: 32.1 g/dL (ref 31.5–35.7)
MCV: 103 fL — ABNORMAL HIGH (ref 79–97)
Platelets: 216 x10E3/uL (ref 150–450)
RBC: 3.83 x10E6/uL (ref 3.77–5.28)
RDW: 11.1 % — ABNORMAL LOW (ref 11.7–15.4)
WBC: 8.3 x10E3/uL (ref 3.4–10.8)

## 2024-02-09 LAB — CMP14+EGFR
ALT: 11 IU/L (ref 0–32)
AST: 20 IU/L (ref 0–40)
Albumin: 4.2 g/dL (ref 3.7–4.7)
Alkaline Phosphatase: 109 IU/L (ref 48–129)
BUN/Creatinine Ratio: 11 — ABNORMAL LOW (ref 12–28)
BUN: 10 mg/dL (ref 8–27)
Bilirubin Total: 0.3 mg/dL (ref 0.0–1.2)
CO2: 23 mmol/L (ref 20–29)
Calcium: 9.6 mg/dL (ref 8.7–10.3)
Chloride: 101 mmol/L (ref 96–106)
Creatinine, Ser: 0.9 mg/dL (ref 0.57–1.00)
Globulin, Total: 2.4 g/dL (ref 1.5–4.5)
Glucose: 97 mg/dL (ref 70–99)
Potassium: 4.6 mmol/L (ref 3.5–5.2)
Sodium: 137 mmol/L (ref 134–144)
Total Protein: 6.6 g/dL (ref 6.0–8.5)
eGFR: 61 mL/min/1.73

## 2024-02-09 LAB — IRON,TIBC AND FERRITIN PANEL
Ferritin: 51 ng/mL (ref 15–150)
Iron Saturation: 38 % (ref 15–55)
Iron: 97 ug/dL (ref 27–139)
Total Iron Binding Capacity: 258 ug/dL (ref 250–450)
UIBC: 161 ug/dL (ref 118–369)

## 2024-02-09 LAB — LIPID PANEL
Chol/HDL Ratio: 2.5 ratio (ref 0.0–4.4)
Cholesterol, Total: 162 mg/dL (ref 100–199)
HDL: 64 mg/dL
LDL Chol Calc (NIH): 78 mg/dL (ref 0–99)
Triglycerides: 114 mg/dL (ref 0–149)
VLDL Cholesterol Cal: 20 mg/dL (ref 5–40)

## 2024-02-09 NOTE — Telephone Encounter (Addendum)
 Pls call dtr Cindy, no contraindication between plavix  and honey

## 2024-02-09 NOTE — Progress Notes (Signed)
 Hi Brittan, kidney function is stable.  Liver enzymes were normal.  Hemoglobin looks great.  Cholesterol looks fantastic.  Iron levels look good as well.

## 2024-02-12 NOTE — Telephone Encounter (Signed)
 Called and left detailed voicemail message on daughter's voice mail . (Allowed on DPR)

## 2024-02-15 ENCOUNTER — Ambulatory Visit (INDEPENDENT_AMBULATORY_CARE_PROVIDER_SITE_OTHER): Payer: Medicare (Managed Care)

## 2024-02-15 VITALS — BP 132/63 | HR 74 | Ht 67.0 in | Wt 189.0 lb

## 2024-02-15 DIAGNOSIS — J301 Allergic rhinitis due to pollen: Secondary | ICD-10-CM | POA: Diagnosis not present

## 2024-02-15 DIAGNOSIS — Z0000 Encounter for general adult medical examination without abnormal findings: Secondary | ICD-10-CM

## 2024-02-15 DIAGNOSIS — R059 Cough, unspecified: Secondary | ICD-10-CM | POA: Diagnosis not present

## 2024-02-15 MED ORDER — ALBUTEROL SULFATE HFA 108 (90 BASE) MCG/ACT IN AERS: 2.0000 | INHALATION_SPRAY | Freq: Four times a day (QID) | RESPIRATORY_TRACT | 0 refills | Status: AC | PRN

## 2024-02-15 NOTE — Patient Instructions (Signed)
 Ms. Macmaster,  Thank you for taking the time for your Medicare Wellness Visit. I appreciate your continued commitment to your health goals. Please review the care plan we discussed, and feel free to reach out if I can assist you further.  Please note that Annual Wellness Visits do not include a physical exam. Some assessments may be limited, especially if the visit was conducted virtually. If needed, we may recommend an in-person follow-up with your provider.  Ongoing Care Seeing your primary care provider every 3 to 6 months helps us  monitor your health and provide consistent, personalized care.   Referrals If a referral was made during today's visit and you haven't received any updates within two weeks, please contact the referred provider directly to check on the status.  Recommended Screenings:  Health Maintenance  Topic Date Due   Breast Cancer Screening  08/16/2023   Medicare Annual Wellness Visit  02/13/2024   COVID-19 Vaccine (8 - Moderna risk 2025-26 season) 05/10/2024   DTaP/Tdap/Td vaccine (3 - Td or Tdap) 09/12/2029   Pneumococcal Vaccine for age over 84  Completed   Flu Shot  Completed   Osteoporosis screening with Bone Density Scan  Completed   Zoster (Shingles) Vaccine  Completed   Meningitis B Vaccine  Aged Out   Colon Cancer Screening  Discontinued       02/15/2024    2:58 PM  Advanced Directives  Does Patient Have a Medical Advance Directive? Yes  Type of Estate Agent of [place];Living will  Does patient want to make changes to medical advance directive? No - Patient declined    Vision: Annual vision screenings are recommended for early detection of glaucoma, cataracts, and diabetic retinopathy. These exams can also reveal signs of chronic conditions such as diabetes and high blood pressure.  Dental: Annual dental screenings help detect early signs of oral cancer, gum disease, and other conditions linked to overall health, including heart  disease and diabetes.  Please see the attached documents for additional preventive care recommendations.

## 2024-02-15 NOTE — Progress Notes (Signed)
 "  Chief Complaint  Patient presents with   Medicare Wellness     Subjective:   Michelle [name] is a 89 y.o. female who presents for a Medicare Annual Wellness Visit.  Visit info / Clinical Intake: Medicare Wellness Visit Type:: Subsequent Annual Wellness Visit Persons participating in visit and providing information:: patient Medicare Wellness Visit Mode:: In-person (required for WTM) Interpreter Needed?: No Pre-visit prep was completed: yes AWV questionnaire completed by patient prior to visit?: no Living arrangements:: with family/others Patient's Overall Health Status Rating: very good Typical amount of pain: some Does pain affect daily life?: no Are you currently prescribed opioids?: no  Dietary Habits and Nutritional Risks How many meals a day?: 2 Eats fruit and vegetables daily?: yes Most meals are obtained by: having others provide food In the last 2 weeks, have you had any of the following?: none Diabetic:: no  Functional Status Activities of Daily Living (to include ambulation/medication): Independent Ambulation: Independent Medication Administration: Independent Home Management (perform basic housework or laundry): Independent Manage your own finances?: yes Primary transportation is: family / friends Concerns about vision?: no *vision screening is required for WTM* Concerns about hearing?: no  Fall Screening Falls in the past year?: 0 Number of falls in past year: 0 Was there an injury with Fall?: 0 Fall Risk Category Calculator: 0 Patient Fall Risk Level: Low Fall Risk  Fall Risk Patient at Risk for Falls Due to: No Fall Risks Fall risk Follow up: Falls evaluation completed  Home and Transportation Safety: All rugs have non-skid backing?: (!) no All stairs or steps have railings?: N/A, no stairs Grab bars in the bathtub or shower?: yes Have non-skid surface in bathtub or shower?: yes Good home lighting?: yes Regular seat belt use?: yes Hospital stays  in the last year:: no  Cognitive Assessment Difficulty concentrating, remembering, or making decisions? : yes Will 6CIT or Mini Cog be Completed: yes What year is it?: 0 points What month is it?: 0 points Give patient an address phrase to remember (5 components): [address] [place], [name] About what time is it?: 0 points Count backwards from 20 to 1: 0 points Say the months of the year in reverse: 0 points Repeat the address phrase from earlier: 0 points 6 CIT Score: 0 points  Advance Directives (For Healthcare) Does Patient Have a Medical Advance Directive?: Yes Does patient want to make changes to medical advance directive?: No - Patient declined Type of Advance Directive: Healthcare Power of [place]; Living will Copy of Healthcare Power of Attorney in Chart?: No - copy requested  Reviewed/Updated  Reviewed/Updated: Medical History; Surgical History; Medications; Allergies; Care Teams; Patient Goals    Allergies (verified) Benadryl [diphenhydramine hcl], Doxycycline, Simvastatin, Fosamax  [alendronate  sodium], Lisinopril , and Nsaids   Current Medications (verified) Outpatient Encounter Medications as of 02/15/2024  Medication Sig   acetaminophen  (TYLENOL ) 325 MG tablet Take 650 mg by mouth as needed for mild pain.  (Patient taking differently: Take 500 mg by mouth as needed for mild pain (pain score 1-3).)   AMBULATORY NON FORMULARY MEDICATION Medication Name: Set CPAP to 8-11 mm HG water pressure. Please send down load after 4 weeks of use to [phone].   atorvastatin  (LIPITOR) 20 MG tablet TAKE 1 TABLET BY MOUTH EVERY  OTHER NIGHT   cholecalciferol (VITAMIN D3) 25 MCG (1000 UT) tablet Take 1,000 Units by mouth daily.   clopidogrel  (PLAVIX ) 75 MG tablet TAKE 1 TABLET BY MOUTH DAILY   famotidine  (PEPCID ) 20 MG tablet TAKE  1 TABLET BY MOUTH TWICE  DAILY   fluticasone  (FLONASE ) 50 MCG/ACT nasal spray Place into both nostrils daily.   losartan  (COZAAR ) 25 MG tablet TAKE 1  TABLET BY MOUTH DAILY   memantine  (NAMENDA ) 10 MG tablet TAKE 1 TABLET BY MOUTH TWICE  DAILY   Multiple Vitamins-Minerals (ICAPS AREDS 2 PO) Take 1 capsule by mouth 2 (two) times daily at 10 AM and 5 PM. Morning and evening   VIACTIV 500-500-40 MG-UNT-MCG CHEW CHEW 1 TABLET BY MOUTH EVERY MORNING.   albuterol  (VENTOLIN  HFA) 108 (90 Base) MCG/ACT inhaler Inhale 2 puffs into the lungs every 6 (six) hours as needed for wheezing or shortness of breath. (Patient not taking: Reported on 02/15/2024)   loratadine  (CLARITIN ) 10 MG tablet Take 1 tablet (10 mg total) by mouth daily. (Patient not taking: Reported on 02/15/2024)   No facility-administered encounter medications on file as of 02/15/2024.    History: Past Medical History:  Diagnosis Date   Abdominal aortic aneurysm (AAA) 3.0 cm to 5.0 cm in diameter in female 11/18/2016   Due for repeat in 12/2019.   Abnormal hemoglobin 08/25/2022   Allergic rhinitis 05/26/2020   Aneurysm of infrarenal abdominal aorta 11/19/2013   2.6 cm in 2006 on CT.   CT 10/15 show 3 cm lesion.   US   2018 - no change  CT 2020 - 2.9 cm, stable      Arthritis    hips, lower back   Basal cell carcinoma 2011   R back   Bell's palsy 1984   Bilateral cold feet 03/28/2019   Blood transfusion without reported diagnosis 07/2015   Bradycardia    Cataract    Cholecystitis 2004   Chronic constipation 08/14/2018   Clotting disorder    Complication of anesthesia    COPD (chronic obstructive pulmonary disease)    Essential hypertension, benign 09/26/2007   Genetic testing 09/16/2021   Negative hereditary cancer genetic testing: no pathogenic variants detected in CancerNext-Expanded +RNAinsight Panel.  Report date is September 06, 2021.      The CancerNext-Expanded gene panel offered by [facility] and includes sequencing, rearrangement, and RNA analysis for the following 77 genes: AIP, ALK, APC, ATM, AXIN2, BAP1, BARD1, BLM, BMPR1A, BRCA1, BRCA2, BRIP1, CDC73, CDH1, CDK4, CDKN1B    GERD 11/03/2005   Hiatal hernia 2008   History of CVA (cerebrovascular accident) 09/24/2007   left cerebellar infarction, PICA territory   History of endometrial cancer 04/30/2013   uterine   History of GI bleed 07/04/2015   Hyperlipidemia 11/03/2005   LDL goal <100   Interstitial lung disease 03/28/2019   Iron deficiency anemia, unspecified    Low serum vitamin B12 08/26/2021   Lymphedema of both lower extremities 08/25/2022   Macular degeneration    Malignant neoplasm of upper-outer quadrant of right breast in female, estrogen receptor positive 02/21/2011   Mild vascular neurocognitive disorder 05/14/2021   OSA (obstructive sleep apnea) 04/28/2022   04/2022: HST, AHI 61.9/hr, no central apneas.  Set to 8 cm water pressure based on AutoPap results.     Osteoporosis 11/19/2014   DEXA 10/27/14.  Repeat in 2 yr.  High risk to start medication.    Polyp of colon 2008, 2014   Benign   PONV (postoperative nausea and vomiting)    Primary osteoarthritis of right knee 06/22/2015   Senile purpura 10/12/2022   Shingles 2008   Squamous cell skin cancer, face 03/22/2012   Actively seeing dermatology.   Tendonitis    rt hand  Venous stasis of both lower extremities 08/14/2018   Vocal cord polyp    Past Surgical History:  Procedure Laterality Date   ABDOMINAL HYSTERECTOMY  2005   APPENDECTOMY     BASAL CELL CARCINOMA EXCISION     biospy  10/28/2009   Shave Biospy skin Left hand(Acitinic Keratoses), Right Upper Arm (superficial Basal Cell), Right Upper Back(Superficial Basal Cell), Left Neck ( Solar Lentigo & Seborrheic Keratoses)   BREAST BIOPSY  02/11/2011   Right Breast Needle Core Biospy - Upper Outer Quadrant; ER/PR 100%, Her-2 Neu neg.; Ki-67 10%   BREAST LUMPECTOMY WITH RADIOACTIVE SEED LOCALIZATION Right 06/12/2018   Procedure: RIGHT BREAST LUMPECTOMY WITH RADIOACTIVE SEED LOCALIZATION;  Surgeon: Ebbie Cough, MD;  Location: [facility];  Service: General;   Laterality: Right;   BREAST MAMMOSITE  03/14/2011   Procedure: MAMMOSITE BREAST;  Surgeon: Cough Ebbie, MD;  Location: [facility];  Service: General;  Laterality: Right;   BREAST SURGERY     right breast lumpectomy snbx   CHOLECYSTECTOMY  2004   COLONOSCOPY     COLONOSCOPY W/ POLYPECTOMY     2008, 2014 (benign)   COLONOSCOPY WITH PROPOFOL  N/A 07/09/2015   Procedure: COLONOSCOPY WITH PROPOFOL ;  Surgeon: Lesta JULIANNA Fitz, MD;  Location: [facility];  Service: Endoscopy;  Laterality: N/A;   DILATION AND CURETTAGE OF UTERUS N/A 04/24/2013   Procedure: DILATATION AND CURETTAGE;  Surgeon: Burnard VEAR Pate, MD;  Location: [facility];  Service: Gynecology;  Laterality: N/A;   ESOPHAGOGASTRODUODENOSCOPY N/A 07/06/2015   Procedure: ESOPHAGOGASTRODUODENOSCOPY (EGD);  Surgeon: Jerrell Sol, MD;  Location[facility];  Service: Endoscopy;  Laterality: N/A;   HYSTEROSCOPY N/A 04/24/2013   Procedure: HYSTEROSCOPY;  Surgeon: Burnard VEAR Pate, MD;  Location: [facility];  Service: Gynecology;  Laterality: N/A;   ROBOTIC ASSISTED TOTAL HYSTERECTOMY WITH BILATERAL SALPINGO OOPHERECTOMY Bilateral 06/11/2013   Procedure: ROBOTIC ASSISTED TOTAL HYSTERECTOMY WITH BILATERAL SALPINGO OOPHORECTOMY WITH POSSIBLE STAGING, EPISIOTOMY;  Surgeon: Sari Bachelor, MD;  Location: [facility];  Service: Gynecology;  Laterality: Bilateral;   SQUAMOUS CELL CARCINOMA EXCISION     TEAR DUCT PROBING     TONSILLECTOMY     vocal cord poylps  1969   excision   Family History  Problem Relation Age of Onset   Heart disease Father    Cancer Sister 4       Lymphoma   Breast cancer Sister    Cancer Paternal Aunt        Breast cancer   Cancer Daughter 11       Bilateral Breast Cancer   Breast cancer Daughter 87   Cancer Daughter        Breast Cancer   Breast cancer Daughter    Cancer Sister        2005 - Renal Cell Cancer and Breast Cancer   Breast cancer Sister    Social History   Occupational History    Occupation: Retired    Comment: photographer; financial planner  Tobacco Use   Smoking status: Former    Current packs/day: 0.00    Average packs/day: 1 pack/day for 30.0 years (30.0 ttl pk-yrs)    Types: Cigarettes    Start date: 01/31/1966    Quit date: 02/01/1996    Years since quitting: 28.0   Smokeless tobacco: Never  Vaping Use   Vaping status: Never Used  Substance and Sexual Activity   Alcohol use: Not Currently   Drug use: No   Sexual activity: Yes    Partners:  Male    Birth control/protection: Surgical, None    Comment: Menarche age 78, menopause age 56, HRT x 1year   Tobacco Counseling Counseling given: Not Answered  SDOH Screenings   Food Insecurity: No Food Insecurity (02/15/2024)  Housing: Low Risk (02/15/2024)  Transportation Needs: No Transportation Needs (02/15/2024)  Utilities: Not At Risk (02/15/2024)  Alcohol Screen: Low Risk (02/13/2023)  Depression (PHQ2-9): Low Risk (02/15/2024)  Financial Resource Strain: Low Risk (02/08/2024)  Physical Activity: Inactive (02/15/2024)  Social Connections: Moderately Integrated (02/15/2024)  Stress: No Stress Concern Present (02/15/2024)  Tobacco Use: Medium Risk (02/15/2024)  Health Literacy: Adequate Health Literacy (02/15/2024)   See flowsheets for full screening details  Depression Screen PHQ 2 & 9 Depression Scale- Over the past 2 weeks, how often have you been bothered by any of the following problems? Little interest or pleasure in doing things: 0 Feeling down, depressed, or hopeless (PHQ Adolescent also includes...irritable): 0 PHQ-2 Total Score: 0     Goals Addressed             This Visit's Progress    Patient Stated       Patient states she would like to lose weight.              Objective:    Today's Vitals   02/15/24 1446  BP: (!) 145/70  Pulse: 74  SpO2: 98%  Weight: 189 lb (85.7 kg)  Height: 5' 7 (1.702 m)   Body mass index is 29.6 kg/m.  Hearing/Vision screen No results found. Immunizations  and Health Maintenance Health Maintenance  Topic Date Due   Mammogram  08/16/2023   COVID-19 Vaccine (8 - Moderna risk 2025-26 season) 05/10/2024   Medicare Annual Wellness (AWV)  02/14/2025   DTaP/Tdap/Td (3 - Td or Tdap) 09/12/2029   Pneumococcal Vaccine: 50+ Years  Completed   Influenza Vaccine  Completed   Bone Density Scan  Completed   Zoster Vaccines- Shingrix  Completed   Meningococcal B Vaccine  Aged Out   Colonoscopy  Discontinued        Assessment/Plan:  This is a routine wellness examination for Michelle [name].  Patient Care Team: [name], MD as PCP - General (Family Medicine) Cleatus Collar, MD as Attending Physician (Ophthalmology) Wertman, Sara E, PA-C (Neurology) Loretha Ash, MD as Consulting Physician (Hematology and Oncology)  I have personally reviewed and noted the following in the patients chart:   Medical and social history Use of alcohol, tobacco or illicit drugs  Current medications and supplements including opioid prescriptions. Functional ability and status Nutritional status Physical activity Advanced directives List of other physicians Hospitalizations, surgeries, and ER visits in previous 12 months Vitals Screenings to include cognitive, depression, and falls Referrals and appointments  No orders of the defined types were placed in this encounter.  In addition, I have reviewed and discussed with patient certain preventive protocols, quality metrics, and best practice recommendations. A written personalized care plan for preventive services as well as general preventive health recommendations were provided to patient.   Bonny Jon [name], CMA   02/15/2024   Return in 1 year (on 02/14/2025).  After Visit Summary: (In Person-Printed) AVS printed and given to the patient  Nurse Notes:   Michelle [name] is a 89 y.o. female patient of Dorothyann Alvan, MD who had a Medicare Annual Wellness Visit today. She reports that she is  socially active and does interact with friends/family regularly. She is minimally physically active. She enjoys spending time with family and  friends.       "

## 2024-02-28 ENCOUNTER — Other Ambulatory Visit: Payer: Self-pay | Admitting: Family Medicine

## 2024-02-28 MED ORDER — LOSARTAN POTASSIUM 25 MG PO TABS: 25.0000 mg | ORAL_TABLET | Freq: Every day | ORAL | 0 refills | Status: AC

## 2024-02-28 MED ORDER — ATORVASTATIN CALCIUM 20 MG PO TABS: ORAL_TABLET | ORAL | 0 refills | Status: AC

## 2024-02-28 MED ORDER — FAMOTIDINE 20 MG PO TABS: 20.0000 mg | ORAL_TABLET | Freq: Two times a day (BID) | ORAL | 0 refills | Status: AC

## 2024-02-28 NOTE — Telephone Encounter (Signed)
 Received a refill request for   Atorvastatin  20mg   Last written 05/31/2023 Famotidine  20mg  Last written 05/31/2023 Losartan  25mg   Last written 05/31/2023 Last OV 02/08/2024 Next appt 07/092026

## 2024-02-28 NOTE — Telephone Encounter (Signed)
 Refills were being sent at same time for this patient.  So will keep previous order as sent by Parthenia Nap today 02/28/2024 as preferred refill.

## 2024-03-13 ENCOUNTER — Encounter (INDEPENDENT_AMBULATORY_CARE_PROVIDER_SITE_OTHER): Payer: Medicare Other | Admitting: Ophthalmology

## 2024-03-27 ENCOUNTER — Other Ambulatory Visit: Payer: Medicare (Managed Care)

## 2024-07-16 ENCOUNTER — Ambulatory Visit: Payer: Self-pay | Admitting: Physician Assistant

## 2024-08-08 ENCOUNTER — Ambulatory Visit: Payer: Medicare (Managed Care) | Admitting: Family Medicine

## 2025-02-18 ENCOUNTER — Ambulatory Visit: Payer: Medicare (Managed Care)
